# Patient Record
Sex: Male | Born: 1950 | Race: White | Hispanic: No | Smoking: Former smoker
Health system: Southern US, Community
[De-identification: ages and names within clinical notes are randomized; demographics above are authoritative.]

## PROBLEM LIST (undated history)

## (undated) DIAGNOSIS — F329 Major depressive disorder, single episode, unspecified: Secondary | ICD-10-CM

## (undated) DIAGNOSIS — K759 Inflammatory liver disease, unspecified: Secondary | ICD-10-CM

## (undated) DIAGNOSIS — R972 Elevated prostate specific antigen [PSA]: Secondary | ICD-10-CM

## (undated) DIAGNOSIS — I951 Orthostatic hypotension: Secondary | ICD-10-CM

## (undated) DIAGNOSIS — J449 Chronic obstructive pulmonary disease, unspecified: Secondary | ICD-10-CM

## (undated) DIAGNOSIS — I10 Essential (primary) hypertension: Secondary | ICD-10-CM

## (undated) DIAGNOSIS — I519 Heart disease, unspecified: Secondary | ICD-10-CM

## (undated) DIAGNOSIS — I509 Heart failure, unspecified: Secondary | ICD-10-CM

## (undated) DIAGNOSIS — D689 Coagulation defect, unspecified: Secondary | ICD-10-CM

## (undated) DIAGNOSIS — F419 Anxiety disorder, unspecified: Secondary | ICD-10-CM

## (undated) DIAGNOSIS — K219 Gastro-esophageal reflux disease without esophagitis: Secondary | ICD-10-CM

## (undated) DIAGNOSIS — S069X9A Unspecified intracranial injury with loss of consciousness of unspecified duration, initial encounter: Secondary | ICD-10-CM

## (undated) DIAGNOSIS — F32A Depression, unspecified: Secondary | ICD-10-CM

## (undated) DIAGNOSIS — G4730 Sleep apnea, unspecified: Secondary | ICD-10-CM

## (undated) DIAGNOSIS — E7800 Pure hypercholesterolemia, unspecified: Secondary | ICD-10-CM

## (undated) DIAGNOSIS — R339 Retention of urine, unspecified: Secondary | ICD-10-CM

## (undated) DIAGNOSIS — I4891 Unspecified atrial fibrillation: Secondary | ICD-10-CM

## (undated) DIAGNOSIS — M1990 Unspecified osteoarthritis, unspecified site: Secondary | ICD-10-CM

## (undated) DIAGNOSIS — S069XAA Unspecified intracranial injury with loss of consciousness status unknown, initial encounter: Secondary | ICD-10-CM

## (undated) DIAGNOSIS — I219 Acute myocardial infarction, unspecified: Secondary | ICD-10-CM

## (undated) HISTORY — PX: OTHER SURGICAL HISTORY: SHX169

## (undated) HISTORY — DX: Sleep apnea, unspecified: G47.30

## (undated) HISTORY — DX: Heart disease, unspecified: I51.9

## (undated) HISTORY — DX: Acute myocardial infarction, unspecified: I21.9

## (undated) HISTORY — DX: Essential (primary) hypertension: I10

## (undated) HISTORY — DX: Elevated prostate specific antigen (PSA): R97.20

## (undated) HISTORY — DX: Anxiety disorder, unspecified: F41.9

## (undated) HISTORY — DX: Depression, unspecified: F32.A

## (undated) HISTORY — DX: Heart failure, unspecified: I50.9

## (undated) HISTORY — DX: Unspecified atrial fibrillation: I48.91

## (undated) HISTORY — DX: Pure hypercholesterolemia, unspecified: E78.00

## (undated) HISTORY — DX: Coagulation defect, unspecified: D68.9

## (undated) HISTORY — PX: BLADDER SURGERY: SHX569

## (undated) HISTORY — DX: Inflammatory liver disease, unspecified: K75.9

## (undated) HISTORY — DX: Major depressive disorder, single episode, unspecified: F32.9

## (undated) HISTORY — DX: Unspecified osteoarthritis, unspecified site: M19.90

## (undated) HISTORY — DX: Unspecified intracranial injury with loss of consciousness of unspecified duration, initial encounter: S06.9X9A

## (undated) HISTORY — DX: Unspecified intracranial injury with loss of consciousness status unknown, initial encounter: S06.9XAA

## (undated) HISTORY — DX: Gastro-esophageal reflux disease without esophagitis: K21.9

## (undated) HISTORY — PX: EP IMPLANTABLE DEVICE: SHX172B

## (undated) HISTORY — PX: CARDIAC CATHETERIZATION: SHX172

---

## 2008-06-05 ENCOUNTER — Ambulatory Visit: Payer: Self-pay | Admitting: Internal Medicine

## 2014-07-06 HISTORY — PX: GREEN LIGHT LASER TURP (TRANSURETHRAL RESECTION OF PROSTATE: SHX6260

## 2016-06-18 ENCOUNTER — Emergency Department
Admission: EM | Admit: 2016-06-18 | Discharge: 2016-06-19 | Disposition: A | Discharge status: Home / Self Care | Payer: BLUE CROSS/BLUE SHIELD | Attending: Student in an Organized Health Care Education/Training Program | Admitting: Student in an Organized Health Care Education/Training Program

## 2016-06-18 DIAGNOSIS — R319 Hematuria, unspecified: Secondary | ICD-10-CM | POA: Diagnosis present

## 2016-06-18 DIAGNOSIS — Z79891 Long term (current) use of opiate analgesic: Secondary | ICD-10-CM | POA: Insufficient documentation

## 2016-06-18 DIAGNOSIS — I509 Heart failure, unspecified: Secondary | ICD-10-CM | POA: Diagnosis not present

## 2016-06-18 DIAGNOSIS — J449 Chronic obstructive pulmonary disease, unspecified: Secondary | ICD-10-CM | POA: Insufficient documentation

## 2016-06-18 DIAGNOSIS — M791 Myalgia, unspecified site: Secondary | ICD-10-CM

## 2016-06-18 DIAGNOSIS — Z8546 Personal history of malignant neoplasm of prostate: Secondary | ICD-10-CM | POA: Diagnosis not present

## 2016-06-18 DIAGNOSIS — R3911 Hesitancy of micturition: Secondary | ICD-10-CM | POA: Insufficient documentation

## 2016-06-18 HISTORY — DX: Heart failure, unspecified: I50.9

## 2016-06-18 HISTORY — DX: Chronic obstructive pulmonary disease, unspecified: J44.9

## 2016-06-18 NOTE — ED Provider Notes (Signed)
[facility] Emergency Department Provider Note    First MD Initiated Contact with Patient 06/18/16 2337     (approximate)  I have reviewed the triage vital signs and the nursing notes.   HISTORY  Chief Complaint Urinary Tract Infection    HPI Kevin Pearson is a 65 y.o. 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 Patient presents today because the myalgias and body aches worsened. He denies any chest pain or worsening shortness of breath. He wears oxygen intermittently.   Past Medical History:  Diagnosis Date  . CHF (congestive heart failure) (HCC)   . COPD (chronic obstructive pulmonary disease) (HCC)    No family history on file. Past Surgical History:  Procedure Laterality Date  . BLADDER SURGERY    . cardiac stents     There are no active problems to display for this patient.     Prior to Admission medications   Not on File    Allergies Patient has no allergy information on record.    Social History Social History  Substance Use Topics  . Smoking status: Former Games developermoker  . Smokeless tobacco: Never Used  . Alcohol use Yes    Review of Systems Patient denies headaches, rhinorrhea, blurry vision, numbness, shortness of breath, chest pain, edema, cough, abdominal pain, nausea, vomiting, diarrhea, dysuria, fevers, rashes or hallucinations unless otherwise stated above in HPI. ____________________________________________   PHYSICAL EXAM:  VITAL SIGNS: Vitals:   06/18/16 2332  BP: 90/63  Pulse: 70  Resp: 16  Temp: 97.7 F (36.5 C)    Constitutional: Alert and oriented. In no acute distress Eyes: Conjunctivae are normal. PERRL. EOMI. Head: Atraumatic. Nose: No congestion/rhinnorhea. Mouth/Throat: Mucous membranes are moist.  Oropharynx  non-erythematous. Neck: No stridor. Painless ROM. No cervical spine tenderness to palpation Hematological/Lymphatic/Immunilogical: No cervical lymphadenopathy. Cardiovascular: Normal rate, regular rhythm. Grossly normal heart sounds.  Good peripheral circulation. Respiratory: Normal respiratory effort.  No retractions. Lungs with diffuse expiratory wheezing. Gastrointestinal: Soft and nontender. No distention. No abdominal bruits. No CVA tenderness. Musculoskeletal: No lower extremity tenderness nor edema.  No joint effusions. Neurologic:  Normal speech and language. No gross focal neurologic deficits are appreciated. No gait instability. Skin:  Skin is warm, dry and intact. No rash noted. Psychiatric: Mood and affect are normal. Speech and behavior are normal.  ____________________________________________   LABS (all labs ordered are listed, but only abnormal results are displayed)  No results found for this or any previous visit (from the past 24 hour(s)). ____________________________________________  EKG My review and personal interpretation at Time: 23:32   Indication: h/o cad  Rate: 70  Rhythm: v paced Axis: normal Other: no sgarbossa criteria,  ____________________________________________  RADIOLOGY  I personally reviewed all radiographic images ordered to evaluate for the above acute complaints and reviewed radiology reports and findings.  These findings were personally discussed with the patient.  Please see medical record for radiology report.  ____________________________________________   PROCEDURES  Procedure(s) performed: none Procedures    Critical Care performed: no ____________________________________________   INITIAL IMPRESSION / ASSESSMENT AND PLAN / ED COURSE  Pertinent labs & imaging results that were available during my care of the patient were reviewed by me and considered in my medical decision making (see chart for details).  Uti, acs, sepsis,  pna, flu, arthralgias  Kevin EngCarmen Yapp is  a 65 y.o. who presents to the ED with multiple complaints most of which seem to be ongoing for 4-5 months. Will order urinalysis to evaluate for any underlying urinary tract infection or hematuria. Patient with significant history of CAD but describing very atypical features and denies any chest pain or shortness of breath. EKG shows a paced rhythm without any acute ischemic changes. Initial troponin shows no evidence of troponin elevation. We'll order chest x-ray to evaluate for any evidence of pneumonia or active congestive heart failure. Patient also with reported history of COPD but has good diffuse lung sounds. His abdominal exam is soft and benign. He has no evidence of trauma on exam.  The patient will be placed on continuous pulse oximetry and telemetry for monitoring.  Laboratory evaluation will be sent to evaluate for the above complaints.     Clinical Course as of Jun 20 431  Fri Jun 19, 2016  0059 I spoke with the patient's daughter who states the patient was complaining of generalized myalgias as well as nausea. States that she has noted some cognitive decline over the past year. No reported chest pain or shortness of breath. He has been complaining of UTIs and was reportedly prone to them therefore she was concerned about urinary tract infection.  [PR]  0144 Urinalysis that shows no evidence of hematuria or infection. Blood work shows no evidence of leukocytosis or acidosis. His troponin is negative. Currently awaiting lactic acid. His flu is negative. The patient remains hemodynamically stable without any signs of Sirs criteria. Based on his cardiac risk factors will continue to monitor patient and repeat the 3 hour troponin.  [PR]  [phone] Patient remains assymptomatic and in NAD.  No evidence of dysrhythmia on the monitor.  Currently awaiting repeat trop.    [PR]  0359 Repeat trop negative.  Patient's blood work is reassuring. He remains hemodynamic  stable without any Sirs criteria. He's been asymptomatic since his arrival. Do not feel further diagnostic testing clinically indicated at this time. He did demonstrate some evidence of urinary hesitancy therefore will start on Flomax and provide referral for both cardiology and urology here  [PR]    Clinical Course User Index [PR] Willy EddyPatrick Caidence Kaseman, MD     ____________________________________________   FINAL CLINICAL IMPRESSION(S) / ED DIAGNOSES  Final diagnoses:  Generalized muscle ache  Urinary hesitancy      NEW MEDICATIONS STARTED DURING THIS VISIT:  New Prescriptions   No medications on file     Note:  This document was prepared using Dragon voice recognition software and may include unintentional dictation errors.    Willy EddyPatrick Lacharles Altschuler, MD 06/19/16 [phone]

## 2016-06-18 NOTE — ED Triage Notes (Addendum)
Pt presents to ED with c/o UTI-like s/x's and generalized body aches x "4-5 months". Pt reports cloudy, bloody, and foul-smelling urine x "3-4 months". Pt reports having partial bladder removal in December of 2016. Pt is A&O, in NAD, with respirations even, regular, and unlabored. EMS states pt has implanted pacemaker/defibrillator with a cardiac h/x of "14 MI's and stent placements".

## 2016-06-19 ENCOUNTER — Emergency Department: Payer: BLUE CROSS/BLUE SHIELD

## 2016-06-19 LAB — COMPREHENSIVE METABOLIC PANEL
ALT: 10 U/L — ABNORMAL LOW (ref 17–63)
AST: 18 U/L (ref 15–41)
Albumin: 3.5 g/dL (ref 3.5–5.0)
Alkaline Phosphatase: 61 U/L (ref 38–126)
Anion gap: 6 (ref 5–15)
BUN: 25 mg/dL — ABNORMAL HIGH (ref 6–20)
CO2: 27 mmol/L (ref 22–32)
Calcium: 9.1 mg/dL (ref 8.9–10.3)
Chloride: 106 mmol/L (ref 101–111)
Creatinine, Ser: 1.26 mg/dL — ABNORMAL HIGH (ref 0.61–1.24)
GFR calc Af Amer: >60 mL/min (ref >60)
GFR calc non Af Amer: 59 mL/min — ABNORMAL LOW (ref >60)
Glucose, Bld: 98 mg/dL (ref 65–99)
Potassium: 3.8 mmol/L (ref 3.5–5.1)
Sodium: 139 mmol/L (ref 135–145)
Total Bilirubin: 0.5 mg/dL (ref 0.3–1.2)
Total Protein: 7 g/dL (ref 6.5–8.1)

## 2016-06-19 LAB — CBC WITH DIFFERENTIAL/PLATELET
Basophils Absolute: 0.1 10*3/uL (ref 0–0.1)
Basophils Relative: 1 %
Eosinophils Absolute: 0.3 10*3/uL (ref 0–0.7)
Eosinophils Relative: 4 %
HCT: 33.5 % — ABNORMAL LOW (ref 40.0–52.0)
Hemoglobin: 11.1 g/dL — ABNORMAL LOW (ref 13.0–18.0)
Lymphocytes Relative: 28 %
Lymphs Abs: 2.3 10*3/uL (ref 1.0–3.6)
MCH: 27.3 pg (ref 26.0–34.0)
MCHC: 33.3 g/dL (ref 32.0–36.0)
MCV: 81.9 fL (ref 80.0–100.0)
Monocytes Absolute: 1 10*3/uL (ref 0.2–1.0)
Monocytes Relative: 12 %
Neutro Abs: 4.6 10*3/uL (ref 1.4–6.5)
Neutrophils Relative %: 55 %
Platelets: 211 10*3/uL (ref 150–440)
RBC: 4.09 MIL/uL — ABNORMAL LOW (ref 4.40–5.90)
RDW: 19.6 % — ABNORMAL HIGH (ref 11.5–14.5)
WBC: 8.3 10*3/uL (ref 3.8–10.6)

## 2016-06-19 LAB — URINALYSIS, COMPLETE (UACMP) WITH MICROSCOPIC
Bacteria, UA: NONE SEEN
Bilirubin Urine: NEGATIVE
Glucose, UA: NEGATIVE mg/dL
Hgb urine dipstick: NEGATIVE
Ketones, ur: NEGATIVE mg/dL
Leukocytes, UA: NEGATIVE
Nitrite: NEGATIVE
Protein, ur: NEGATIVE mg/dL
Specific Gravity, Urine: 1.016 (ref 1.005–1.030)
pH: 6 (ref 5.0–8.0)

## 2016-06-19 LAB — INFLUENZA PANEL BY PCR (TYPE A & B)
Influenza A By PCR: NEGATIVE
Influenza B By PCR: NEGATIVE

## 2016-06-19 LAB — LACTIC ACID, PLASMA: Lactic Acid, Venous: 1.7 mmol/L (ref 0.5–1.9)

## 2016-06-19 LAB — TROPONIN I
Troponin I: <0.03 ng/mL (ref <0.03)
Troponin I: <0.03 ng/mL (ref <0.03)

## 2016-06-19 MED ORDER — TAMSULOSIN HCL 0.4 MG PO CAPS: 0.4000 mg | ORAL_CAPSULE | Freq: Every day | ORAL | 0 refills | Status: DC

## 2016-06-19 NOTE — Discharge Instructions (Signed)
Return for any fevers, chest pain, shortness of breath, Nausea or vomiting.  Follow up with urology and cardiology.  Return for any other concerns.

## 2016-06-19 NOTE — ED Notes (Addendum)
Spoke with pt's daughter Gunnar Fusi(Cara Przybylski [phone]) regarding transportation arrangements d/t pt's pending d/c.  Ms Janet BerlinGutto stated she would be willing to pick up the pt at d/c to drive him home and would be here before 530am since [place] [facility] [place]

## 2016-06-24 ENCOUNTER — Encounter: Payer: Self-pay | Admitting: Urology

## 2016-06-24 ENCOUNTER — Observation Stay
Admission: EM | Admit: 2016-06-24 | Discharge: 2016-06-25 | Disposition: A | Discharge status: Home / Self Care | Payer: BLUE CROSS/BLUE SHIELD | Attending: Urology | Admitting: Urology

## 2016-06-24 ENCOUNTER — Ambulatory Visit (INDEPENDENT_AMBULATORY_CARE_PROVIDER_SITE_OTHER): Payer: BLUE CROSS/BLUE SHIELD | Admitting: Urology

## 2016-06-24 VITALS — BP 153/72 | HR 65 | Ht 68.0 in | Wt 263.8 lb

## 2016-06-24 DIAGNOSIS — I2510 Atherosclerotic heart disease of native coronary artery without angina pectoris: Secondary | ICD-10-CM

## 2016-06-24 DIAGNOSIS — R339 Retention of urine, unspecified: Secondary | ICD-10-CM

## 2016-06-24 DIAGNOSIS — I429 Cardiomyopathy, unspecified: Secondary | ICD-10-CM | POA: Insufficient documentation

## 2016-06-24 DIAGNOSIS — Z885 Allergy status to narcotic agent status: Secondary | ICD-10-CM | POA: Insufficient documentation

## 2016-06-24 DIAGNOSIS — E7800 Pure hypercholesterolemia, unspecified: Secondary | ICD-10-CM | POA: Diagnosis not present

## 2016-06-24 DIAGNOSIS — I110 Hypertensive heart disease with heart failure: Secondary | ICD-10-CM | POA: Insufficient documentation

## 2016-06-24 DIAGNOSIS — N320 Bladder-neck obstruction: Secondary | ICD-10-CM | POA: Diagnosis not present

## 2016-06-24 DIAGNOSIS — D689 Coagulation defect, unspecified: Secondary | ICD-10-CM | POA: Diagnosis not present

## 2016-06-24 DIAGNOSIS — R3911 Hesitancy of micturition: Secondary | ICD-10-CM

## 2016-06-24 DIAGNOSIS — Z955 Presence of coronary angioplasty implant and graft: Secondary | ICD-10-CM | POA: Diagnosis not present

## 2016-06-24 DIAGNOSIS — Z79899 Other long term (current) drug therapy: Secondary | ICD-10-CM | POA: Insufficient documentation

## 2016-06-24 DIAGNOSIS — Z888 Allergy status to other drugs, medicaments and biological substances status: Secondary | ICD-10-CM | POA: Insufficient documentation

## 2016-06-24 DIAGNOSIS — Z8546 Personal history of malignant neoplasm of prostate: Secondary | ICD-10-CM | POA: Diagnosis not present

## 2016-06-24 DIAGNOSIS — F419 Anxiety disorder, unspecified: Secondary | ICD-10-CM | POA: Insufficient documentation

## 2016-06-24 DIAGNOSIS — F329 Major depressive disorder, single episode, unspecified: Secondary | ICD-10-CM | POA: Insufficient documentation

## 2016-06-24 DIAGNOSIS — Z95810 Presence of automatic (implantable) cardiac defibrillator: Secondary | ICD-10-CM | POA: Diagnosis not present

## 2016-06-24 DIAGNOSIS — M1990 Unspecified osteoarthritis, unspecified site: Secondary | ICD-10-CM | POA: Diagnosis not present

## 2016-06-24 DIAGNOSIS — K219 Gastro-esophageal reflux disease without esophagitis: Secondary | ICD-10-CM | POA: Insufficient documentation

## 2016-06-24 DIAGNOSIS — Z9981 Dependence on supplemental oxygen: Secondary | ICD-10-CM | POA: Diagnosis not present

## 2016-06-24 DIAGNOSIS — N359 Urethral stricture, unspecified: Principal | ICD-10-CM | POA: Insufficient documentation

## 2016-06-24 DIAGNOSIS — G4730 Sleep apnea, unspecified: Secondary | ICD-10-CM | POA: Insufficient documentation

## 2016-06-24 DIAGNOSIS — J449 Chronic obstructive pulmonary disease, unspecified: Secondary | ICD-10-CM | POA: Diagnosis not present

## 2016-06-24 DIAGNOSIS — N400 Enlarged prostate without lower urinary tract symptoms: Secondary | ICD-10-CM

## 2016-06-24 DIAGNOSIS — Z7902 Long term (current) use of antithrombotics/antiplatelets: Secondary | ICD-10-CM | POA: Diagnosis not present

## 2016-06-24 DIAGNOSIS — I5042 Chronic combined systolic (congestive) and diastolic (congestive) heart failure: Secondary | ICD-10-CM | POA: Diagnosis not present

## 2016-06-24 DIAGNOSIS — Z87891 Personal history of nicotine dependence: Secondary | ICD-10-CM | POA: Diagnosis not present

## 2016-06-24 DIAGNOSIS — Z7901 Long term (current) use of anticoagulants: Secondary | ICD-10-CM | POA: Diagnosis not present

## 2016-06-24 DIAGNOSIS — Z01810 Encounter for preprocedural cardiovascular examination: Secondary | ICD-10-CM | POA: Diagnosis not present

## 2016-06-24 DIAGNOSIS — I5022 Chronic systolic (congestive) heart failure: Secondary | ICD-10-CM

## 2016-06-24 DIAGNOSIS — I10 Essential (primary) hypertension: Secondary | ICD-10-CM

## 2016-06-24 DIAGNOSIS — I482 Chronic atrial fibrillation: Secondary | ICD-10-CM | POA: Insufficient documentation

## 2016-06-24 DIAGNOSIS — I252 Old myocardial infarction: Secondary | ICD-10-CM | POA: Insufficient documentation

## 2016-06-24 HISTORY — DX: Retention of urine, unspecified: R33.9

## 2016-06-24 LAB — PROTIME-INR
INR: 1.43
Prothrombin Time: 17.6 seconds — ABNORMAL HIGH (ref 11.4–15.2)

## 2016-06-24 LAB — COMPREHENSIVE METABOLIC PANEL
ALT: 12 U/L — ABNORMAL LOW (ref 17–63)
AST: 21 U/L (ref 15–41)
Albumin: 3.8 g/dL (ref 3.5–5.0)
Alkaline Phosphatase: 43 U/L (ref 38–126)
Anion gap: 6 (ref 5–15)
BUN: 20 mg/dL (ref 6–20)
CO2: 27 mmol/L (ref 22–32)
Calcium: 9.4 mg/dL (ref 8.9–10.3)
Chloride: 106 mmol/L (ref 101–111)
Creatinine, Ser: 1.13 mg/dL (ref 0.61–1.24)
GFR calc Af Amer: >60 mL/min (ref >60)
GFR calc non Af Amer: >60 mL/min (ref >60)
Glucose, Bld: 106 mg/dL — ABNORMAL HIGH (ref 65–99)
Potassium: 4.2 mmol/L (ref 3.5–5.1)
Sodium: 139 mmol/L (ref 135–145)
Total Bilirubin: 0.3 mg/dL (ref 0.3–1.2)
Total Protein: 7.5 g/dL (ref 6.5–8.1)

## 2016-06-24 LAB — CBC WITH DIFFERENTIAL/PLATELET
Basophils Absolute: 0.1 10*3/uL (ref 0–0.1)
Basophils Relative: 1 %
Eosinophils Absolute: 0.4 10*3/uL (ref 0–0.7)
Eosinophils Relative: 4 %
HCT: 36.8 % — ABNORMAL LOW (ref 40.0–52.0)
Hemoglobin: 12.2 g/dL — ABNORMAL LOW (ref 13.0–18.0)
Lymphocytes Relative: 19 %
Lymphs Abs: 1.8 10*3/uL (ref 1.0–3.6)
MCH: 27.2 pg (ref 26.0–34.0)
MCHC: 33 g/dL (ref 32.0–36.0)
MCV: 82.3 fL (ref 80.0–100.0)
Monocytes Absolute: 1.1 10*3/uL — ABNORMAL HIGH (ref 0.2–1.0)
Monocytes Relative: 12 %
Neutro Abs: 5.9 10*3/uL (ref 1.4–6.5)
Neutrophils Relative %: 64 %
Platelets: 219 10*3/uL (ref 150–440)
RBC: 4.47 MIL/uL (ref 4.40–5.90)
RDW: 19.7 % — ABNORMAL HIGH (ref 11.5–14.5)
WBC: 9.3 10*3/uL (ref 3.8–10.6)

## 2016-06-24 LAB — URINALYSIS, COMPLETE (UACMP) WITH MICROSCOPIC
Bacteria, UA: NONE SEEN
Bilirubin Urine: NEGATIVE
Glucose, UA: NEGATIVE mg/dL
Ketones, ur: NEGATIVE mg/dL
Leukocytes, UA: NEGATIVE
Nitrite: NEGATIVE
Protein, ur: NEGATIVE mg/dL
Specific Gravity, Urine: 1.01 (ref 1.005–1.030)
Squamous Epithelial / HPF: NONE SEEN
pH: 7 (ref 5.0–8.0)

## 2016-06-24 LAB — BLADDER SCAN AMB NON-IMAGING: Scan Result: >776

## 2016-06-24 LAB — APTT: aPTT: 38 seconds — ABNORMAL HIGH (ref 24–36)

## 2016-06-24 MED ORDER — PANTOPRAZOLE SODIUM 40 MG PO TBEC: 80.0000 mg | DELAYED_RELEASE_TABLET | Freq: Every day | ORAL | Status: DC

## 2016-06-24 MED ORDER — HYDROMORPHONE HCL 1 MG/ML IJ SOLN
0.5000 mg | Freq: Once | INTRAMUSCULAR | Status: AC
  Administered 2016-06-24: 0.5 mg via INTRAVENOUS

## 2016-06-24 MED ORDER — ALBUTEROL SULFATE HFA 108 (90 BASE) MCG/ACT IN AERS: 2.0000 | INHALATION_SPRAY | RESPIRATORY_TRACT | Status: DC | PRN

## 2016-06-24 MED ORDER — CEFAZOLIN (ANCEF) 1 G IV SOLR
2.0000 g | INTRAVENOUS | Status: AC
  Administered 2016-06-25: 2 g
  Filled 2016-06-24: qty 2

## 2016-06-24 MED ORDER — PHENAZOPYRIDINE HCL 200 MG PO TABS: 200.0000 mg | ORAL_TABLET | Freq: Three times a day (TID) | ORAL | 0 refills | Status: DC | PRN

## 2016-06-24 MED ORDER — FUROSEMIDE 40 MG PO TABS
40.0000 mg | ORAL_TABLET | Freq: Two times a day (BID) | ORAL | Status: DC
  Administered 2016-06-24 – 2016-06-25 (×2): 40 mg via ORAL
  Filled 2016-06-24 (×2): qty 1

## 2016-06-24 MED ORDER — PHENAZOPYRIDINE HCL 100 MG PO TABS
100.0000 mg | ORAL_TABLET | Freq: Three times a day (TID) | ORAL | Status: DC
  Administered 2016-06-24 – 2016-06-25 (×2): 100 mg via ORAL
  Filled 2016-06-24 (×3): qty 1

## 2016-06-24 MED ORDER — ZOLPIDEM TARTRATE 5 MG PO TABS
5.0000 mg | ORAL_TABLET | Freq: Every evening | ORAL | Status: DC | PRN
  Administered 2016-06-25: 5 mg via ORAL
  Filled 2016-06-24: qty 1

## 2016-06-24 MED ORDER — DIPHENHYDRAMINE HCL 50 MG/ML IJ SOLN: 12.5000 mg | Freq: Four times a day (QID) | INTRAMUSCULAR | Status: DC | PRN

## 2016-06-24 MED ORDER — NITROGLYCERIN 0.4 MG SL SUBL: 0.4000 mg | SUBLINGUAL_TABLET | SUBLINGUAL | Status: DC | PRN

## 2016-06-24 MED ORDER — DIPHENHYDRAMINE HCL 12.5 MG/5ML PO ELIX: 12.5000 mg | ORAL_SOLUTION | Freq: Four times a day (QID) | ORAL | Status: DC | PRN

## 2016-06-24 MED ORDER — POTASSIUM CHLORIDE CRYS ER 20 MEQ PO TBCR
20.0000 meq | EXTENDED_RELEASE_TABLET | Freq: Two times a day (BID) | ORAL | Status: DC
Start: 2016-06-24 — End: 2016-06-25
  Administered 2016-06-24: 20 meq via ORAL
  Filled 2016-06-24: qty 1

## 2016-06-24 MED ORDER — GABAPENTIN 300 MG PO CAPS
300.0000 mg | ORAL_CAPSULE | Freq: Three times a day (TID) | ORAL | Status: DC
  Administered 2016-06-24: 300 mg via ORAL
  Filled 2016-06-24: qty 1

## 2016-06-24 MED ORDER — DOCUSATE SODIUM 100 MG PO CAPS
100.0000 mg | ORAL_CAPSULE | Freq: Two times a day (BID) | ORAL | Status: DC
  Administered 2016-06-24: 100 mg via ORAL
  Filled 2016-06-24: qty 1

## 2016-06-24 MED ORDER — ROSUVASTATIN CALCIUM 20 MG PO TABS: 20.0000 mg | ORAL_TABLET | Freq: Every day | ORAL | Status: DC

## 2016-06-24 MED ORDER — ALBUTEROL SULFATE (2.5 MG/3ML) 0.083% IN NEBU: 2.5000 mg | INHALATION_SOLUTION | RESPIRATORY_TRACT | Status: DC | PRN

## 2016-06-24 MED ORDER — ACETAMINOPHEN 325 MG PO TABS
650.0000 mg | ORAL_TABLET | ORAL | Status: DC | PRN
  Administered 2016-06-24: 650 mg via ORAL
  Filled 2016-06-24: qty 2

## 2016-06-24 MED ORDER — LIDOCAINE HCL 2 % EX GEL
CUTANEOUS | Status: AC
  Administered 2016-06-24: 1 via URETHRAL
  Filled 2016-06-24: qty 10

## 2016-06-24 MED ORDER — LIDOCAINE HCL 2 % EX GEL
1.0000 "application " | Freq: Once | CUTANEOUS | Status: AC
  Administered 2016-06-24: 1 via URETHRAL

## 2016-06-24 MED ORDER — ONDANSETRON HCL 4 MG/2ML IJ SOLN
INTRAMUSCULAR | Status: AC
  Administered 2016-06-24: 4 mg via INTRAVENOUS
  Filled 2016-06-24: qty 2

## 2016-06-24 MED ORDER — LORAZEPAM 2 MG/ML IJ SOLN
INTRAMUSCULAR | Status: AC
  Administered 2016-06-24: 1 mg via INTRAVENOUS
  Filled 2016-06-24: qty 1

## 2016-06-24 MED ORDER — CLONAZEPAM 1 MG PO TABS
1.0000 mg | ORAL_TABLET | Freq: Three times a day (TID) | ORAL | Status: DC
  Administered 2016-06-24 – 2016-06-25 (×2): 1 mg via ORAL
  Filled 2016-06-24 (×2): qty 2

## 2016-06-24 MED ORDER — TAMSULOSIN HCL 0.4 MG PO CAPS
0.4000 mg | ORAL_CAPSULE | Freq: Every day | ORAL | Status: DC
  Administered 2016-06-24: 0.4 mg via ORAL
  Filled 2016-06-24: qty 1

## 2016-06-24 MED ORDER — SODIUM CHLORIDE 0.9 % IV SOLN: INTRAVENOUS | Status: DC

## 2016-06-24 MED ORDER — DIVALPROEX SODIUM ER 500 MG PO TB24
500.0000 mg | ORAL_TABLET | Freq: Every day | ORAL | Status: DC
  Administered 2016-06-24 – 2016-06-25 (×2): 500 mg via ORAL
  Filled 2016-06-24 (×3): qty 1

## 2016-06-24 MED ORDER — CLOPIDOGREL BISULFATE 75 MG PO TABS: 75.0000 mg | ORAL_TABLET | Freq: Every day | ORAL | Status: DC

## 2016-06-24 MED ORDER — RIVAROXABAN 20 MG PO TABS
20.0000 mg | ORAL_TABLET | Freq: Every day | ORAL | Status: DC
  Filled 2016-06-24: qty 1

## 2016-06-24 MED ORDER — HYDRALAZINE HCL 25 MG PO TABS
25.0000 mg | ORAL_TABLET | Freq: Three times a day (TID) | ORAL | Status: DC
  Administered 2016-06-24: 25 mg via ORAL
  Filled 2016-06-24: qty 1

## 2016-06-24 MED ORDER — OXYCODONE HCL 5 MG PO TABS: 5.0000 mg | ORAL_TABLET | ORAL | Status: DC | PRN

## 2016-06-24 MED ORDER — LOSARTAN POTASSIUM 50 MG PO TABS
100.0000 mg | ORAL_TABLET | Freq: Every day | ORAL | Status: DC
  Administered 2016-06-25: 100 mg via ORAL
  Filled 2016-06-24: qty 2

## 2016-06-24 MED ORDER — ONDANSETRON HCL 4 MG/2ML IJ SOLN: 4.0000 mg | INTRAMUSCULAR | Status: DC | PRN

## 2016-06-24 MED ORDER — MORPHINE SULFATE (PF) 4 MG/ML IV SOLN: 2.0000 mg | INTRAVENOUS | Status: DC | PRN

## 2016-06-24 MED ORDER — LORAZEPAM 2 MG/ML IJ SOLN
1.0000 mg | Freq: Once | INTRAMUSCULAR | Status: AC
  Administered 2016-06-24: 1 mg via INTRAVENOUS

## 2016-06-24 MED ORDER — HYDROMORPHONE HCL 1 MG/ML IJ SOLN
INTRAMUSCULAR | Status: AC
  Administered 2016-06-24: 0.5 mg via INTRAVENOUS
  Filled 2016-06-24: qty 1

## 2016-06-24 MED ORDER — CARVEDILOL 6.25 MG PO TABS
6.2500 mg | ORAL_TABLET | Freq: Two times a day (BID) | ORAL | Status: DC
  Administered 2016-06-24 – 2016-06-25 (×2): 6.25 mg via ORAL
  Filled 2016-06-24 (×2): qty 1

## 2016-06-24 MED ORDER — ONDANSETRON HCL 4 MG/2ML IJ SOLN
4.0000 mg | Freq: Once | INTRAMUSCULAR | Status: AC
  Administered 2016-06-24: 4 mg via INTRAVENOUS

## 2016-06-24 NOTE — Progress Notes (Addendum)
06/24/2016 12:40 PM   Kevin Pearson 1950-10-08 [id number]  Referring provider: No referring provider defined for this encounter.  Chief Complaint  Patient presents with  . New Patient (Initial Visit)    urinary hesitancy    HPI: Patient is a 65 year old Caucasian male who is referred to [place] from the emergency room for urinary hesitancy and frequency.  He is a poor historian and has recently relocated from [place].  I do not have his records available to me at this visit from his urologist in [place].  He has a significant cardiac history and states he is on several blood thinners.  He cannot give [place] a list of his medications.    He states that for the last month he's been having extreme difficulty with passing his urine. He states it has been foul-smelling and had blood.  According to the emergency room records it stated that he had a history of prostate cancer and a partial cystectomy.  Upon further questioning with the patient, it was most likely a [place] for urethral strictures.  His IPSS score today is 35, which is severe lower urinary tract symptomatology. He is terrible with his quality life due to his urinary symptoms.  His PVR is > 776 mL.  His major complaint today is inability to pass urine and suprapubic pain.   He currently taking tamsulosin.    He also denies any recent fevers, chills, nausea or vomiting.      IPSS    Row Name 06/24/16 1100         International Prostate Symptom Score   How often have you had the sensation of not emptying your bladder? Almost always     How often have you had to urinate less than every two hours? Almost always     How often have you found you stopped and started again several times when you urinated? Almost always     How often have you found it difficult to postpone urination? Almost always     How often have you had a weak urinary stream? Almost always     How often have you had to strain to start urination? Almost always     How many times did you typically get up at night to urinate? 5 Times     Total IPSS Score 35       Quality of Life due to urinary symptoms   If you were to spend the rest of your life with your urinary condition just the way it is now how would you feel about that? Terrible        Score:  1-7 Mild 8-19 Moderate 20-35 Severe      PMH: Past Medical History:  Diagnosis Date  . Acid reflux   . Anxiety   . Arthritis   . Atrial fibrillation ([place])   . CHF (congestive heart failure) ([place])   . Clotting disorder ([place])   . COPD (chronic obstructive pulmonary disease) ([place])   . Depression   . Elevated PSA   . Heart attack   . Heart disease   . Heart failure ([place])   . Hepatitis   . High cholesterol   . Hypertension   . Sleep apnea     Surgical History: Past Surgical History:  Procedure Laterality Date  . BLADDER SURGERY    . cardiac stents    . GREEN LIGHT LASER TURP (TRANSURETHRAL RESECTION OF PROSTATE  2016   done in [facility]  Home Medications:  Allergies as of 06/24/2016      Reactions   Codeine Nausea And Vomiting      Medication List       Accurate as of 06/24/16 12:40 PM. Always use your most recent med list.          tamsulosin 0.4 MG Caps capsule Commonly known as:  FLOMAX Take 1 capsule (0.4 mg total) by mouth daily after supper.       Allergies:  Allergies  Allergen Reactions  . Codeine Nausea And Vomiting    Family History: Family History  Problem Relation Age of Onset  . Kidney cancer Neg Hx   . Kidney disease Neg Hx   . Prostate cancer Neg Hx     Social History:  reports that he has quit smoking. He has never used smokeless tobacco. He reports that he drinks alcohol. He reports that he does not use drugs.  ROS: UROLOGY Frequent Urination?: Yes Hard to postpone urination?: Yes Burning/pain with urination?: Yes Get up at night to urinate?: Yes Leakage of urine?: Yes Urine stream starts and stops?: Yes Trouble starting stream?:  Yes Do you have to strain to urinate?: Yes Blood in urine?: Yes Urinary tract infection?: No Sexually transmitted disease?: No Injury to kidneys or bladder?: Yes Painful intercourse?: Yes Weak stream?: Yes Erection problems?: Yes Penile pain?: Yes  Gastrointestinal Nausea?: Yes Vomiting?: Yes Indigestion/heartburn?: Yes Diarrhea?: Yes Constipation?: Yes  Constitutional Fever: Yes Night sweats?: Yes Weight loss?: Yes Fatigue?: Yes  Skin Skin rash/lesions?: No Itching?: Yes  Eyes Blurred vision?: Yes Double vision?: No  Ears/Nose/Throat Sore throat?: Yes Sinus problems?: Yes  Hematologic/Lymphatic Swollen glands?: Yes Easy bruising?: Yes  Cardiovascular Leg swelling?: Yes Chest pain?: Yes  Respiratory Cough?: Yes Shortness of breath?: Yes  Endocrine Excessive thirst?: No  Musculoskeletal Back pain?: Yes Joint pain?: Yes  Neurological Headaches?: No Dizziness?: No  Psychologic Depression?: Yes Anxiety?: Yes  Physical Exam: BP (!) 153/72   Pulse 65   Ht _0  (1.727 m)   Wt 263 lb 12.8 oz (119.7 kg)   BMI 40.11 kg/m   Constitutional: Well nourished. Alert and oriented, No acute distress. HEENT: [place] AT, moist mucus membranes. Trachea midline, no masses. Cardiovascular: No clubbing, cyanosis, or edema. Respiratory: Normal respiratory effort, no increased work of breathing. GI: Abdomen is soft, non tender, non distended, no abdominal masses. Liver and spleen not palpable.  No hernias appreciated.  Stool sample for occult testing is not indicated.   GU: No CVA tenderness.  No bladder fullness or masses.  Patient with circumcised phallus.   Urethral meatus is patent.  No penile discharge. No penile lesions or rashes. Scrotum without lesions, cysts, rashes and/or edema.  Testicles are located scrotally bilaterally. No masses are appreciated in the testicles. Left and right epididymis are normal. Rectal: Deferred.   Skin: No rashes, bruises or  suspicious lesions. Lymph: No cervical or inguinal adenopathy. Neurologic: Grossly intact, no focal deficits, moving all 4 extremities. Psychiatric: Normal mood and affect.  Laboratory Data: Lab Results  Component Value Date   WBC 8.3 06/19/2016   HGB 11.1 (L) 06/19/2016   HCT 33.5 (L) 06/19/2016   MCV 81.9 06/19/2016   PLT 211 06/19/2016    Lab Results  Component Value Date   CREATININE 1.26 (H) 06/19/2016    Lab Results  Component Value Date   AST 18 06/19/2016   Lab Results  Component Value Date   ALT 10 (L) 06/19/2016    Pertinent  Imaging: Results for RENALDO, GORNICK (MRN [id number]) as of 06/24/2016 12:10  Ref. Range 06/24/2016 11:21  Scan Result Unknown >776     Procedure Attempted catheter placement Patient is present today for an office visit and was found to be in urinary retention.   Patient was cleaned and prepped in a sterile fashion with betadine.   A 16 FR foley cath was inserted, but it would not pass the prostatic urethra.  A 16 FR coude was then attempted and met with the same resistance.  A 14 FR coude was then attempted and met with the same resistance.  No urine return was noted.    Preformed by: Zara Council, PA-C  Assessment & Plan:   1. Urinary retention  - attempts at placing a Foley failed  - patient will need to seek further treatment in the ED  - transfered by EMS due psychosocial issues to the ED  - daughter has been contacted and will be faxing a list of his current medications to [facility] ED  - BLADDER SCAN AMB NON-IMAGING  2. Urinary hesitancy  - possible hx of urethral strictures  - see above  2. Question of a history of prostate cancer  - request placed for records from urologists office in [facility].       Return for sent to ED.  These notes generated with voice recognition software. I apologize for typographical errors.  Zara Council, [facility] [address] [place], [place]  [address] [phone]

## 2016-06-24 NOTE — ED Notes (Signed)
Patient is not being discharged per anesthesia who wants patient admitted for pre-op due to cardiac history.

## 2016-06-24 NOTE — Plan of Care (Signed)
[facility] for [facility] Attempting to get information on prior  cardiac cath between dates of 01/04/16 and 03/04/16. From Dr. Prudencio Pairhadda to Dr. Thayer Ohmhris End (Cardiology w/ [facility]). Requested information as soon as possible d/t procedure planned for 12/21.

## 2016-06-24 NOTE — ED Notes (Signed)
Pt only has in bladder, urologist at the bedside and states no catheter needed at this time since the pt was able to urinate prior to her arrival..

## 2016-06-24 NOTE — Addendum Note (Signed)
Addended by: Michiel CowboyMCGOWAN, [place]ym[place] Yeske A on: 06/24/2016 01:24 PM   Modules accepted: Orders

## 2016-06-24 NOTE — Consult Note (Addendum)
Cardiology Consultation Note    Patient ID: Kevin Pearson, MRN: [id number], DOB/AGE: 12/27/1950 65 y.o. Admit date: 06/24/2016   Date of Consult: 06/24/2016 Primary Physician: No PCP Per Patient Primary Cardiologist: None  Chief Complaint: Difficulty voiding Reason for Consultation: Preoperative cardiovascular evaluation Requesting MD: Michiel CowboyShannon McGowan, PA-C  HPI: Kevin EngCarmen Poust is a 65 y.o. male with history of Coronary artery disease, congestive heart failure, atrial fibrillation, COPD, hypertension, sleep apnea, and urinary retention, whom we have been asked to evaluate for preoperative cardiovascular risk assessment in optimization in anticipation of urologic procedure for urinary retention. The patient reports an extensive cardiac history including "13-15 MIs" and 11 stents. He believes his most recent stent was placed about 1-1/2 years ago. He has received most of his cardiovascular care in [place] and [place]. In addition to his coronary artery disease, he has a history of cardiomyopathy and is status post BiV/ICD (St. Jude). He also has atrial fibrillation and is on chronic anticoagulation with rivaroxaban.  The patient reports that he has had issues with urinary retention for some time and was planning to have surgical intervention in [place] about six weeks ago. However, this was postponed due to a "heart attack" for which the patient underwent coronary [facility], and [place]. He does not believe a stent was placed, though he was told that he should not proceed with his urologic surgery at that time. He was advised to stay with family and subsequently moved to [place] to live with his daughter, who works as a Engineer, civil (consulting)nurse at [employer].  The patient has not had any chest pain for over a year despite his history of more recent heart attacks. He has chronic shortness of breath with mild exertion as well as fluctuating lower extremity edema and  orthopnea. He currently wears 24-hour oxygen (between 2-4 L via nasal cannula). He has 1-2 pillow orthopnea. He reports that his most recent LVEF was approximately 35%.  The patient was evaluated today in urology clinic due to urinary retention and difficulty voiding. Foley catheter could not be passed. Therefore, he was referred to the emergency department for admission in anticipation of cystoscopy with urethral dilation tomorrow.  Past Medical History:  Diagnosis Date  . Acid reflux   . Anxiety   . Arthritis   . Atrial fibrillation (HCC)   . CHF (congestive heart failure) (HCC)   . Clotting disorder (HCC)   . COPD (chronic obstructive pulmonary disease) (HCC)   . Depression   . Elevated PSA   . Heart attack   . Heart disease   . Heart failure (HCC)   . Hepatitis   . High cholesterol   . Hypertension   . Sleep apnea   . Urinary retention       Surgical History:  Past Surgical History:  Procedure Laterality Date  . BLADDER SURGERY    . cardiac stents    . EP IMPLANTABLE DEVICE     St. Jude BiV-ICD  . GREEN LIGHT LASER TURP (TRANSURETHRAL RESECTION OF PROSTATE  2016   [facility]      Home Meds: Prior to Admission medications   Medication Sig Start Date Meghin Thivierge Date Taking? Authorizing Provider  acetaminophen (TYLENOL) 325 MG tablet Take 650 mg by mouth every 6 (six) hours as needed.   Yes Historical Provider, MD  albuterol (PROVENTIL HFA;VENTOLIN HFA) 108 (90 Base) MCG/ACT inhaler Inhale 2 puffs into the lungs every 4 (four) hours as needed for  shortness of breath.   Yes Historical Provider, MD  carvedilol (COREG) 6.25 MG tablet Take 6.25 mg by mouth 2 (two) times daily.   Yes Historical Provider, MD  clonazePAM (KLONOPIN) 1 MG tablet Take 1 mg by mouth 3 (three) times daily.   Yes Historical Provider, MD  clopidogrel (PLAVIX) 75 MG tablet Take 75 mg by mouth daily.   Yes Historical Provider, MD  divalproex (DEPAKOTE ER) 500 MG 24 hr tablet Take by mouth 2 (two) times daily.    Yes Historical Provider, MD  furosemide (LASIX) 40 MG tablet Take 40 mg by mouth 2 (two) times daily.   Yes Historical Provider, MD  gabapentin (NEURONTIN) 300 MG capsule Take 300 mg by mouth 3 (three) times daily.   Yes Historical Provider, MD  hydrALAZINE (APRESOLINE) 25 MG tablet Take 25 mg by mouth 3 (three) times daily.   Yes Historical Provider, MD  Ipratropium-Albuterol (ALBUTEROL-IPRATROPIUM IN) Inhale into the lungs 4 (four) times daily.   Yes Historical Provider, MD  losartan (COZAAR) 100 MG tablet Take 100 mg by mouth daily.   Yes Historical Provider, MD  Melatonin 5 MG TABS Take 1 tablet by mouth at bedtime.   Yes Historical Provider, MD  nitroGLYCERIN (NITROSTAT) 0.4 MG SL tablet Place 0.4 mg under the tongue every 5 (five) minutes as needed for chest pain.   Yes Historical Provider, MD  omeprazole (PRILOSEC) 40 MG capsule Take 40 mg by mouth daily.   Yes Historical Provider, MD  potassium chloride SA (K-DUR,KLOR-CON) 20 MEQ tablet Take 20 mEq by mouth 2 (two) times daily.   Yes Historical Provider, MD  rivaroxaban (XARELTO) 20 MG TABS tablet Take 20 mg by mouth at bedtime.   Yes Historical Provider, MD  rosuvastatin (CRESTOR) 20 MG tablet Take 20 mg by mouth daily.   Yes Historical Provider, MD  tamsulosin (FLOMAX) 0.4 MG CAPS capsule Take 1 capsule (0.4 mg total) by mouth daily after supper. 06/19/16  Yes Willy Eddy, MD  traMADol (ULTRAM) 50 MG tablet Take 50 mg by mouth every 6 (six) hours as needed.   Yes Historical Provider, MD  phenazopyridine (PYRIDIUM) 200 MG tablet Take 1 tablet (200 mg total) by mouth 3 (three) times daily as needed for pain. 06/24/16   Arnaldo Natal, MD    Allergies:  Allergies  Allergen Reactions  . Codeine Nausea And Vomiting  . Other Itching    Mango skin    Social History   Social History  . Marital status: Divorced    Spouse name: N/A  . Number of children: N/A  . Years of education: N/A   Occupational History  . Not on file.    Social History Main Topics  . Smoking status: Former Smoker    Types: Cigarettes    Quit date: 08/2015  . Smokeless tobacco: Never Used  . Alcohol use Yes  . Drug use: No  . Sexual activity: No   Other Topics Concern  . Not on file   Social History Narrative  . No narrative on file     Family History  Problem Relation Age of Onset  . Other Father     Cerebral hemorrhage  . Kidney cancer Neg Hx   . Kidney disease Neg Hx   . Prostate cancer Neg Hx      Review of Systems: A 12-system review of systems was performed and is negative except as noted in the HPI.  Labs: No results for input(s): CKTOTAL, CKMB, TROPONINI in the  last 72 hours. Lab Results  Component Value Date   WBC 9.3 06/24/2016   HGB 12.2 (L) 06/24/2016   HCT 36.8 (L) 06/24/2016   MCV 82.3 06/24/2016   PLT 219 06/24/2016     Recent Labs Lab 06/24/16 1307  NA 139  K 4.2  CL 106  CO2 27  BUN 20  CREATININE 1.13  CALCIUM 9.4  PROT 7.5  BILITOT 0.3  ALKPHOS 43  ALT 12*  AST 21  GLUCOSE 106*   Radiology/Studies:  Dg Chest 2 View  Result Date: 06/19/2016 CLINICAL DATA:  65 year old male with COPD and CHF. Concern for pneumonia. EXAM: CHEST  2 VIEW COMPARISON:  None. FINDINGS: The lungs are clear. There is no pleural effusion or pneumothorax. The cardiac silhouette is within normal limits. Coronary stent noted. Left pectoral AICD device. No acute osseous pathology. IMPRESSION: No active cardiopulmonary disease. Electronically Signed   By: Elgie CollardArash  Radparvar M.D.   On: 06/19/2016 00:20    Wt Readings from Last 3 Encounters:  06/24/16 260 lb (117.9 kg)  06/24/16 263 lb 12.8 oz (119.7 kg)  06/18/16 240 lb (108.9 kg)    EKG: Atrial fibrillation with biventricular pacing.   Physical Exam: Blood pressure 110/61, pulse 71, temperature 97.6 F (36.4 C), temperature source Oral, resp. rate 18, height 5\' 8"  (1.727 m), weight 260 lb (117.9 kg), SpO2 99 %. Body mass index is 39.53 kg/m. General:  Obese man, lying comfortably on gurney. Head: Normocephalic, atraumatic, sclera non-icteric, no xanthomas, nares are without discharge.  Neck: Negative for carotid bruits. JVP approximately 8-10 cm with positive HJR. Lungs: Fair air movement. No wheezes alert or crackles. Heart: RRR with S1 S2. No murmurs, rubs, or gallops appreciated. Abdomen: Soft, with mild suprapubic tenderness. No abdominal distention. Unable to assess hepatosplenomegaly due to body habitus. Msk:  Strength and tone appear normal for age. Extremities: No clubbing or cyanosis. 2+ pretibial edema.  Distal pedal pulses are 2+ and equal bilaterally. Neuro: Alert and oriented X 3. No facial asymmetry. No focal deficit. Moves all extremities spontaneously. Psych:  Responds to questions appropriately with a normal affect.    Assessment and Plan  65 year old man with complex cardiac history including coronary artery disease with multiple MIs and PCI's, chronic systolic heart failure (presumably due to ischemic cardiomyopathy), and permanent atrial fibrillation with biventricular pacing, who is been admitted for urinary retention with plans to undergo cystoscopy and urethral dilation tomorrow.  Preoperative cardiovascular evaluation: Given the patient's extensive cardiac history and report of myocardial infarction within the last six weeks, he is high risk for any surgery and elective procedures should be delayed if possible. However, if it is felt that delaying surgical intervention places the patient at significant risk for urologic/renal complications, then surgery should proceed on an emergent basis. Please note that it typically takes approximately 48 hours for anticoagulation effects of rivaroxaban to dissipate; he took his last dose yesterday evening. The patient is also on clopidogrel; it typically takes 5-7 days for normal platelet function to return after discontinuation of this medication. I would advocate for continuation of  clopidogrel or at least low-dose aspirin in the perioperative period given the patient's history of CAD and recent MI. Ideally, we would wait until records have been received from prior hospitalizations in order to better understand his cardiac history. I do not feel that additional cardiac testing is warranted until we have a had a chance to review his outside records.  Coronary artery disease: The patient's not have chest  pain or worsening shortness of breath to suggest unstable CAD. However, he reports an MI within the last six weeks. Though he does not believe stent was placed at that time, the findings of cardiac catheterization at that time are unclear. We have requested records from [facility]. In the meantime, I would advocate for continuation of clopidogrel plus either rivaroxaban or aspirin unless significant bleeding were to occur.  Chronic systolic heart failure: The patient appears volume overloaded on exam with lower extremity edema and mildly elevated JVP as well as a history of orthopnea and exertional dyspnea. Ideally, we would initiate more aggressive diuresis though I am hesitant to do this in the setting of his bladder outlet obstruction. IV fluids should be minimized and diuresis initiated once his urologic issues have been addressed. The patient should remain on carvedilol, losartan, and hydralazine.  Permanent atrial fibrillation: The patient reports a long history of atrial fibrillation, noted on recent EKG. I would advocate for continuation of rivaroxaban once felt to be safe from a surgical standpoint, given his CHADSVASC score of at least 3. I will hold tonight's dose in anticipation of urologic intervention tomorrow.  Hypertension: Blood pressure is well controlled. I would not make any changes to his current medication regimen.   Zenovia Jordan Starsky Nanna MD 06/24/2016, 6:34 PM Pager: [phone]

## 2016-06-24 NOTE — ED Notes (Signed)
Dr. Darnelle CatalanMalinda attempted to insert foley catheter without success..Marland Kitchen

## 2016-06-24 NOTE — H&P (Signed)
H&P  Chief Complaint: Difficulty urinating  History of Present Illness: Camelia EngCarmen Rossitto is a 65 y.o. year old with an extensive cardiac history including CHF, CAD, multiple MIs, COPD who is  in the emergency room with difficulty emptying his bladder and difficult Foley placement.  This is his second time in the emergency room within 1 week with similar type symptoms.  After his first ER visit, he was referred to [facility] for [facility] evaluation. In our office earlier today, he is found to have greater than 770 cc milliliters in his bladder and was quite uncomfortable. Catheter was unable to be placed despite multiple attempts. He was sent to the emergency room for further evaluation/management. In the emergency room, Dr. Juliette AlcideMelinda attempted a 16 French catheter but was also unsuccessful.   Upon my evaluation, the patient had approximately 600 cc of light pink urine and 2 different urinals. Bladder scan performed at the bedside personally showed proximally 125 cc in his bladder. He was not distended and much more comfortable.  He does complain of severe burning with urination.  He is a poor historian and has recently relocated from [place].  I do not have his records available to me at this visit from his urologist in [place].  He states that for the last month he's been having extreme difficulty with passing his urine. He states it has been foul-smelling and had blood.  According to the emergency room records it stated that he had a history of prostate cancer and a partial cystectomy.  Upon further questioning with the patient, it was most likely a DIVU for urethral strictures.  He has a significant cardiac history and states he is on several blood thinners including Xarelto and Plavix.   he states that he was supposed to have a procedure, but this was canceled due to another MI just following cardiac clearance for the procedure.  His IPSS score today is 35, which is severe  lower urinary tract symptomatology. He is terrible with his quality life due to his urinary symptoms.  His major complaint today is inability to pass urine and suprapubic pain.   He currently taking tamsulosin.   He also denies any recent fevers, chills, nausea or vomiting.  Urinalysis from 06/18/2016 shows no evidence of infection. UA today is unremarkable other than for blood secondary to traumatic Foley attempt.   Past Medical History:  Diagnosis Date  . Acid reflux   . Anxiety   . Arthritis   . Atrial fibrillation (HCC)   . CHF (congestive heart failure) (HCC)   . Clotting disorder (HCC)   . COPD (chronic obstructive pulmonary disease) (HCC)   . Depression   . Elevated PSA   . Heart attack   . Heart disease   . Heart failure (HCC)   . Hepatitis   . High cholesterol   . Hypertension   . Sleep apnea   . Urinary retention     Past Surgical History:  Procedure Laterality Date  . BLADDER SURGERY    . cardiac stents    . GREEN LIGHT LASER TURP (TRANSURETHRAL RESECTION OF PROSTATE  2016   [facility]     Home Medications:  Current Meds  Medication Sig  . acetaminophen (TYLENOL) 325 MG tablet Take 650 mg by mouth every 6 (six) hours as needed.  Marland Kitchen. albuterol (PROVENTIL HFA;VENTOLIN HFA) 108 (90 Base) MCG/ACT inhaler Inhale 2 puffs into the lungs every 4 (four) hours as needed for shortness of  breath.  . carvedilol (COREG) 6.25 MG tablet Take 6.25 mg by mouth 2 (two) times daily.  . clonazePAM (KLONOPIN) 1 MG tablet Take 1 mg by mouth 3 (three) times daily.  . clopidogrel (PLAVIX) 75 MG tablet Take 75 mg by mouth daily.  . divalproex (DEPAKOTE ER) 500 MG 24 hr tablet Take by mouth 2 (two) times daily.  . furosemide (LASIX) 40 MG tablet Take 40 mg by mouth 2 (two) times daily.  Marland Kitchen gabapentin (NEURONTIN) 300 MG capsule Take 300 mg by mouth 3 (three) times daily.  . hydrALAZINE (APRESOLINE) 25 MG tablet Take 25 mg by mouth 3 (three) times daily.  . Ipratropium-Albuterol  (ALBUTEROL-IPRATROPIUM IN) Inhale into the lungs 4 (four) times daily.  Marland Kitchen losartan (COZAAR) 100 MG tablet Take 100 mg by mouth daily.  . Melatonin 5 MG TABS Take 1 tablet by mouth at bedtime.  . nitroGLYCERIN (NITROSTAT) 0.4 MG SL tablet Place 0.4 mg under the tongue every 5 (five) minutes as needed for chest pain.  Marland Kitchen omeprazole (PRILOSEC) 40 MG capsule Take 40 mg by mouth daily.  . potassium chloride SA (K-DUR,KLOR-CON) 20 MEQ tablet Take 20 mEq by mouth 2 (two) times daily.  . rivaroxaban (XARELTO) 20 MG TABS tablet Take 20 mg by mouth at bedtime.  . rosuvastatin (CRESTOR) 20 MG tablet Take 20 mg by mouth daily.  . tamsulosin (FLOMAX) 0.4 MG CAPS capsule Take 1 capsule (0.4 mg total) by mouth daily after supper.  . traMADol (ULTRAM) 50 MG tablet Take 50 mg by mouth every 6 (six) hours as needed.    Allergies:  Allergies  Allergen Reactions  . Codeine Nausea And Vomiting  . Other Itching    Mango skin    Family History  Problem Relation Age of Onset  . Kidney cancer Neg Hx   . Kidney disease Neg Hx   . Prostate cancer Neg Hx     Social History:  reports that he has quit smoking. He has never used smokeless tobacco. He reports that he drinks alcohol. He reports that he does not use drugs.  ROS: A complete review of systems was performed.  All systems are negative except for pertinent findings as noted.  Physical Exam:  Vital signs in last 24 hours: Temp:  [97.6 F (36.4 C)] 97.6 F (36.4 C) (12/20 1246) Pulse Rate:  [60-83] 80 (12/20 1511) Resp:  [17-18] 18 (12/20 1511) BP: (101-153)/(59-98) 121/79 (12/20 1511) SpO2:  [96 %-100 %] 98 % (12/20 1511) Weight:  [260 lb (117.9 kg)-263 lb 12.8 oz (119.7 kg)] 260 lb (117.9 kg) (12/20 1247) Constitutional:  Alert and oriented, No acute distress HEENT: [place] AT, moist mucus membranes.  Trachea midline, no masses Cardiovascular: Regular rate and rhythm.  Bilateral significant lower extremity edema appreciated, pitting. Respiratory:  Normal respiratory effort, lungs clear bilaterally GI: Abdomen is soft, nontender, nondistended, no abdominal masses GU: No CVA tenderness.  Circumcised phallus with orthotopic patent meatus. Testicles descended bilaterally. Skin: No rashes, bruises or suspicious lesions Neurologic: Grossly intact, no focal deficits, moving all 4 extremities Psychiatric: Normal mood and affect   Laboratory Data:   Recent Labs  06/24/16 1307  WBC 9.3  HGB 12.2*  HCT 36.8*    Recent Labs  06/24/16 1307  NA 139  K 4.2  CL 106  CO2 27  GLUCOSE 106*  BUN 20  CREATININE 1.13  CALCIUM 9.4    Recent Labs  06/24/16 1307  INR 1.43   Component     Latest  Ref Rng & Units 06/24/2016  Color, Urine     YELLOW STRAW (A)  Appearance     CLEAR CLEAR (A)  Specific Gravity, Urine     1.005 - 1.030 1.010  pH     5.0 - 8.0 7.0  Glucose     NEGATIVE mg/dL NEGATIVE  Hgb urine dipstick     NEGATIVE LARGE (A)  Bilirubin Urine     NEGATIVE NEGATIVE  Ketones, ur     NEGATIVE mg/dL NEGATIVE  Protein     NEGATIVE mg/dL NEGATIVE  Nitrite     NEGATIVE NEGATIVE  Leukocytes, UA     NEGATIVE NEGATIVE  RBC / HPF     0 - 5 RBC/hpf TOO NUMEROUS TO COUNT  WBC, UA     0 - 5 WBC/hpf 0-5  Bacteria, UA     NONE SEEN NONE SEEN  Squamous Epithelial / LPF     NONE SEEN NONE SEEN  Mucous      PRESENT  Hyaline Casts, UA      PRESENT    Radiologic Imaging: No results found.  Impression/ Plan:  65 year old male with extensive cardiac history with difficulty voiding, incomplete bladder emptying with urinary retention, urethral stricture. With significant difficulty, he ultimately was able to adequately empty his bladder in the emergency room this afternoon.  1. Urethral stricture/incomplete bladder emptying- given inability to place a catheter at the bedside and his ongoing severe urinary complaints, the patient needs cystoscopy, urethral dilation versus DVIU in the operating room. Risk and benefits of  this were discussed in detail with the patient. Unfortunately, he will not be able to hold anticoagulation and therefore we will manage his hematuria which will likely result.    NPO at MN, gentle IV fluids to be given once nothing by mouth  Consent order placed.  Risk and benefits were discussed.  If the patient is not a candidate for general anesthesia requires additional testing, we'll consider interventional radiology placement of ultrasound-guided suprapubic tube for temporization   2. CAD/ MI/ CHF- cardiology consult placed for preoperative optimization, discuss case with Dr.End who will see the patient today  Admit patient for observation to help facilitate/expedite management of #1.  3. Medical comorbidities- will continue all home medications  06/24/2016, 4:32 PM  Vanna ScotlandAshley Amran Malter,  MD

## 2016-06-24 NOTE — ED Triage Notes (Signed)
[facility] ED [facility] with c/o urinary retention, states was seen here for the same on Sunday and referred to urologist, states they were not able to pull up records from visit and was not able to get a foley catheter in to relieve his bladder.

## 2016-06-24 NOTE — ED Notes (Signed)
Patient given ED sandwich tray and diet gingerale per request.
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
## 2016-06-25 ENCOUNTER — Ambulatory Visit: Admit: 2016-06-25 | Payer: BLUE CROSS/BLUE SHIELD | Admitting: Urology

## 2016-06-25 ENCOUNTER — Observation Stay: Payer: BLUE CROSS/BLUE SHIELD | Admitting: Anesthesiology

## 2016-06-25 ENCOUNTER — Other Ambulatory Visit: Payer: Self-pay | Admitting: Urology

## 2016-06-25 ENCOUNTER — Encounter: Payer: Self-pay | Admitting: Anesthesiology

## 2016-06-25 ENCOUNTER — Encounter
Admission: EM | Disposition: A | Discharge status: Home / Self Care | Payer: Self-pay | Source: Home / Self Care | Attending: Emergency Medicine

## 2016-06-25 DIAGNOSIS — N358 Other urethral stricture: Secondary | ICD-10-CM | POA: Diagnosis not present

## 2016-06-25 DIAGNOSIS — R339 Retention of urine, unspecified: Secondary | ICD-10-CM | POA: Diagnosis not present

## 2016-06-25 HISTORY — PX: CYSTOSCOPY WITH URETHRAL DILATATION: SHX5125

## 2016-06-25 LAB — SURGICAL PCR SCREEN
MRSA, PCR: NEGATIVE
Staphylococcus aureus: NEGATIVE

## 2016-06-25 SURGERY — CYSTOSCOPY, WITH URETHRAL DILATION
Anesthesia: Monitor Anesthesia Care

## 2016-06-25 SURGERY — CYSTOSCOPY, WITH URETHRAL DILATION
Anesthesia: Choice

## 2016-06-25 MED ORDER — LIDOCAINE HCL 2 % EX GEL
CUTANEOUS | Status: AC
  Filled 2016-06-25: qty 10

## 2016-06-25 MED ORDER — OXYCODONE HCL 5 MG PO TABS: 5.0000 mg | ORAL_TABLET | ORAL | 0 refills | Status: DC | PRN

## 2016-06-25 MED ORDER — MIDAZOLAM HCL 2 MG/2ML IJ SOLN
INTRAMUSCULAR | Status: DC | PRN
  Administered 2016-06-25: 2 mg via INTRAVENOUS

## 2016-06-25 MED ORDER — PROPOFOL 500 MG/50ML IV EMUL
INTRAVENOUS | Status: AC
  Filled 2016-06-25: qty 50

## 2016-06-25 MED ORDER — LIDOCAINE 2% (20 MG/ML) 5 ML SYRINGE
INTRAMUSCULAR | Status: AC
  Filled 2016-06-25: qty 5

## 2016-06-25 MED ORDER — LACTATED RINGERS IV SOLN
INTRAVENOUS | Status: DC
  Administered 2016-06-25: 13:00:00 via INTRAVENOUS

## 2016-06-25 MED ORDER — PROPOFOL 10 MG/ML IV BOLUS
INTRAVENOUS | Status: DC | PRN
  Administered 2016-06-25 (×2): 20 mg via INTRAVENOUS
  Administered 2016-06-25: 10 mg via INTRAVENOUS
  Administered 2016-06-25: 20 mg via INTRAVENOUS

## 2016-06-25 MED ORDER — HYDROMORPHONE HCL 2 MG PO TABS: ORAL_TABLET | ORAL | 0 refills | Status: DC

## 2016-06-25 MED ORDER — FENTANYL CITRATE (PF) 100 MCG/2ML IJ SOLN
INTRAMUSCULAR | Status: AC
  Filled 2016-06-25: qty 2

## 2016-06-25 MED ORDER — FENTANYL CITRATE (PF) 100 MCG/2ML IJ SOLN
INTRAMUSCULAR | Status: DC | PRN
Start: 2016-06-25 — End: 2016-06-25
  Administered 2016-06-25 (×2): 50 ug via INTRAVENOUS

## 2016-06-25 MED ORDER — MIDAZOLAM HCL 2 MG/2ML IJ SOLN
INTRAMUSCULAR | Status: AC
  Filled 2016-06-25: qty 2

## 2016-06-25 MED ORDER — LIDOCAINE HCL 2 % EX GEL
CUTANEOUS | Status: DC | PRN
  Administered 2016-06-25: 1 via URETHRAL

## 2016-06-25 SURGICAL SUPPLY — 27 items
BAG DRAIN CYSTO-URO LG1000N (MISCELLANEOUS) ×2 IMPLANT
BAG URO DRAIN 2000ML W/SPOUT (MISCELLANEOUS) ×2 IMPLANT
BASIN GRAD PLASTIC 32OZ STRL (MISCELLANEOUS) IMPLANT
CATH FOL 2WAY LX 16X5 (CATHETERS) IMPLANT
CATH SET URETHRAL DILATOR (CATHETERS) ×2 IMPLANT
CATH URETHRAL DIL 7.0X29 (CATHETERS) ×2 IMPLANT
CATH URETL 5X70 OPEN END (CATHETERS) ×2 IMPLANT
CONRAY 43 FOR UROLOGY 50M (MISCELLANEOUS) ×2 IMPLANT
DRAPE SHEET LG 3/4 BI-LAMINATE (DRAPES) IMPLANT
ELECT REM PT RETURN 9FT ADLT (ELECTROSURGICAL) ×2
ELECTRODE REM PT RTRN 9FT ADLT (ELECTROSURGICAL) ×1 IMPLANT
GLOVE BIO SURGEON STRL SZ 6.5 (GLOVE) ×4 IMPLANT
GLOVE BIO SURGEON STRL SZ7 (GLOVE) ×4 IMPLANT
GOWN STRL REUS W/ TWL LRG LVL3 (GOWN DISPOSABLE) ×2 IMPLANT
GOWN STRL REUS W/TWL LRG LVL3 (GOWN DISPOSABLE) ×2
GUIDEWIRE SUPER STIFF (WIRE) ×2 IMPLANT
KIT RM TURNOVER CYSTO AR (KITS) ×2 IMPLANT
PACK CYSTO AR (MISCELLANEOUS) ×2 IMPLANT
SET CYSTO W/LG BORE CLAMP LF (SET/KITS/TRAYS/PACK) ×2 IMPLANT
SOL PREP PVP 2OZ (MISCELLANEOUS)
SOLUTION PREP PVP 2OZ (MISCELLANEOUS) IMPLANT
SURGILUBE 2OZ TUBE FLIPTOP (MISCELLANEOUS) ×2 IMPLANT
SYR 30ML LL (SYRINGE) ×2 IMPLANT
SYRINGE 10CC LL (SYRINGE) ×2 IMPLANT
SYRINGE IRR TOOMEY STRL 70CC (SYRINGE) ×2 IMPLANT
WATER STERILE IRR 1000ML POUR (IV SOLUTION) ×2 IMPLANT
WATER STERILE IRR 3000ML UROMA (IV SOLUTION) ×2 IMPLANT

## 2016-06-25 NOTE — Progress Notes (Signed)
[facility]  Patient: Kevin EngCarmen Pearson / Admit Date: 06/24/2016 / Date of Encounter: 06/25/2016, 1:23 PM   Subjective: Patient is still having significant urologic issues and currently has had no evidence of cardiovascular symptoms of heart failure chest pain weakness or fatigue  Review of Systems: Positive for: Urinary retention Negative for: Vision change, hearing change, syncope, dizziness, nausea, vomiting,diarrhea, bloody stool, stomach pain, cough, congestion, diaphoresis, urinary frequency,  skin lesions, skin rashes Others previously listed  Objective: Physical Exam: Blood pressure 126/86, pulse 70, temperature (!) 96.8 F (36 C), resp. rate 10, height 5\' 8"  (1.727 m), weight 117.9 kg (260 lb), SpO2 99 %. Body mass index is 39.53 kg/m. General: Well developed, well nourished, in no acute distress. Head: Normocephalic, atraumatic, sclera non-icteric, no xanthomas, nares are without discharge. Neck: No apparent masses Lungs: Normal respirations with no wheezes, no rhonchi, no rales , no crackles   Heart: irregular rate and rhythm, normal S1 S2, no murmur, no rub, no gallop, PMI is normal size and placement, carotid upstroke normal without bruit, jugular venous pressure normal Abdomen: Soft, non-tender, non-distended with normoactive bowel sounds. No hepatosplenomegaly. Abdominal aorta is normal size without bruit Extremities: Trace edema, no clubbing, no cyanosis, no ulcers,  Peripheral: 2+ radial, 2+ femoral, 2+ dorsal pedal pulses Neuro: Alert and oriented. Moves all extremities spontaneously. Psych:  Responds to questions appropriately with a normal affect.   Intake/Output Summary (Last 24 hours) at 06/25/16 1323 Last data filed at 06/25/16 1313  Gross per 24 hour  Intake              520 ml  Output             1575 ml  Net            -1055 ml    Inpatient Medications:  . [MAR Hold] carvedilol  6.25 mg Oral BID  . [MAR Hold]  clonazePAM  1 mg Oral TID  . [MAR Hold] clopidogrel  75 mg Oral Daily  . [MAR Hold] divalproex  500 mg Oral Daily  . [MAR Hold] docusate sodium  100 mg Oral BID  . [MAR Hold] furosemide  40 mg Oral BID  . [MAR Hold] gabapentin  300 mg Oral TID  . [MAR Hold] hydrALAZINE  25 mg Oral TID  . [MAR Hold] losartan  100 mg Oral Daily  . [MAR Hold] pantoprazole  80 mg Oral Daily  . [MAR Hold] phenazopyridine  100 mg Oral TID WC  . [MAR Hold] potassium chloride SA  20 mEq Oral BID  . [MAR Hold] rosuvastatin  20 mg Oral Daily  . [facility][MAR Hold] tamsulosin  0.4 mg Oral QPC supper   Infusions:  . lactated ringers Stopped (06/25/16 1313)    Labs:  Recent Labs  06/24/16 1307  NA 139  K 4.2  CL 106  CO2 27  GLUCOSE 106*  BUN 20  CREATININE 1.13  CALCIUM 9.4    Recent Labs  06/24/16 1307  AST 21  ALT 12*  ALKPHOS 43  BILITOT 0.3  PROT 7.5  ALBUMIN 3.8    Recent Labs  06/24/16 1307  WBC 9.3  NEUTROABS 5.9  HGB 12.2*  HCT 36.8*  MCV 82.3  PLT 219   No results for input(s): CKTOTAL, CKMB, TROPONINI in the last 72 hours. Invalid input(s): POCBNP No results for input(s): HGBA1C in the last 72 hours.   Weights: Filed Weights   06/24/16 1247 06/25/16 1122  Weight: 117.9 kg (  260 lb) 117.9 kg (260 lb)     Radiology/Studies:  Dg Chest 2 View  Result Date: 06/19/2016 CLINICAL DATA:  65 year old male with COPD and CHF. Concern for pneumonia. EXAM: CHEST  2 VIEW COMPARISON:  None. FINDINGS: The lungs are clear. There is no pleural effusion or pneumothorax. The cardiac silhouette is within normal limits. Coronary stent noted. Left pectoral AICD device. No acute osseous pathology. IMPRESSION: No active cardiopulmonary disease. Electronically Signed   By: Elgie CollardArash  Radparvar M.D.   On: 06/19/2016 00:20     Assessment and Recommendation  65 y.o. male with atrial fibrillation chronic systolic dysfunction heart failure sleep apnea coronary artery disease status post myocardial  infarctions and stenting currently stable from a cardiac standpoint with no evidence of significant progression of symptoms although recently had a myocardial infarction within the last month on antiplatelet and anticoagulation at moderate to high risk of cardiovascular complication with invasive procedures. Patient understands this and we'll further treat urinary retention as best possible with lowest risk procedures 1. For further risk reduction in bleeding complications with urinary retention cystoscopy and other issues would hold anticoagulation and consider discontinuation of Plavix temporarily until no further bleeding occurs. Would continue aspirin throughout this period of time with procedures due to concerns of vascular disease and recent myocardial infarction 2. Continue carvedilol for her chronic systolic dysfunction congestive heart failure through surgery. 3. No further cardiac diagnostics are necessary at this time 4. High intensity cholesterol therapy without change 5. Further follow-up atrial fibrillation with rapid rate and adjustments of carvedilol as necessary post surgery 6. Reinstatement of Plavix and anticoagulation when able  Signed, Arnoldo HookerBruce Ernest Popowski M.D. FACC

## 2016-06-25 NOTE — Discharge Summary (Signed)
Date of admission: 06/24/2016  Date of discharge: 06/25/2016  Admission diagnosis: Urinary retention, A. fib, chronic diastolic dysfunction heart failure, CAD, status post MI times multiple  Discharge diagnosis: Same, including prostatic urethra/bladder neck contracture  Secondary diagnoses:  Patient Active Problem List   Diagnosis Date Noted  . Urinary retention 06/24/2016    History and Physical: For full details, please see admission history and physical. Briefly, Kevin Pearson is a 65 y.o. year old patient with Severe difficulty voiding and urinary retention. He was admitted for cardiac evaluation and optimization. He was taken to the operating room on postop day 1 for urethral dilation under monitored anesthesia care. His anticoagulation was continued although his Xarelto was held on the evening prior to surgery.Marland Kitchen   Hospital Course: Patient tolerated the procedure well.  He was then transferred to the floor after an uneventful PACU stay.  His hospital course was uncomplicated.  On POD#0 he had met discharge criteria: was eating a regular diet, was up and ambulating independently,  pain was well controlled, catheter draining light pink urine, and was ready to for discharge.   Laboratory values:   Recent Labs  06/24/16 1307  WBC 9.3  HGB 12.2*  HCT 36.8*    Recent Labs  06/24/16 1307  NA 139  K 4.2  CL 106  CO2 27  GLUCOSE 106*  BUN 20  CREATININE 1.13  CALCIUM 9.4    Recent Labs  06/24/16 1307  INR 1.43   No results for input(s): LABURIN in the last 72 hours. Results for orders placed or performed during the hospital encounter of 06/24/16  Surgical PCR screen     Status: None   Collection Time: 06/24/16 11:38 PM  Result Value Ref Range Status   MRSA, PCR NEGATIVE NEGATIVE Final   Staphylococcus aureus NEGATIVE NEGATIVE Final    Comment:        The Xpert SA Assay (FDA approved for NASAL specimens in patients over 69 years of age), is one component of a  comprehensive surveillance program.  Test performance has been validated by [facility] for patients greater than or equal to 67 year old. It is not intended to diagnose infection nor to guide or monitor treatment.     Disposition: Home  Discharge instruction: Routine Foley care.  Continue all home meds including antiplatelet/ anticoagulation  Discharge medications: Allergies as of 06/25/2016      Reactions   Codeine Nausea And Vomiting   Other Itching   Mango skin      Medication List    TAKE these medications   acetaminophen 325 MG tablet Commonly known as:  TYLENOL Take 650 mg by mouth every 6 (six) hours as needed.   albuterol 108 (90 Base) MCG/ACT inhaler Commonly known as:  PROVENTIL HFA;VENTOLIN HFA Inhale 2 puffs into the lungs every 4 (four) hours as needed for shortness of breath.   ALBUTEROL-IPRATROPIUM IN Inhale into the lungs 4 (four) times daily.   carvedilol 6.25 MG tablet Commonly known as:  COREG Take 6.25 mg by mouth 2 (two) times daily.   clonazePAM 1 MG tablet Commonly known as:  KLONOPIN Take 1 mg by mouth 3 (three) times daily.   clopidogrel 75 MG tablet Commonly known as:  PLAVIX Take 75 mg by mouth daily.   divalproex 500 MG 24 hr tablet Commonly known as:  DEPAKOTE ER Take by mouth 2 (two) times daily.   furosemide 40 MG tablet Commonly known as:  LASIX Take 40 mg by mouth  2 (two) times daily.   gabapentin 300 MG capsule Commonly known as:  NEURONTIN Take 300 mg by mouth 3 (three) times daily.   hydrALAZINE 25 MG tablet Commonly known as:  APRESOLINE Take 25 mg by mouth 3 (three) times daily.   losartan 100 MG tablet Commonly known as:  COZAAR Take 100 mg by mouth daily.   Melatonin 5 MG Tabs Take 1 tablet by mouth at bedtime.   nitroGLYCERIN 0.4 MG SL tablet Commonly known as:  NITROSTAT Place 0.4 mg under the tongue every 5 (five) minutes as needed for chest pain.   omeprazole 40 MG capsule Commonly known as:   PRILOSEC Take 40 mg by mouth daily.   oxyCODONE 5 MG immediate release tablet Commonly known as:  ROXICODONE Take 1 tablet (5 mg total) by mouth every 4 (four) hours as needed.   phenazopyridine 200 MG tablet Commonly known as:  PYRIDIUM Take 1 tablet (200 mg total) by mouth 3 (three) times daily as needed for pain.   potassium chloride SA 20 MEQ tablet Commonly known as:  K-DUR,KLOR-CON Take 20 mEq by mouth 2 (two) times daily.   rosuvastatin 20 MG tablet Commonly known as:  CRESTOR Take 20 mg by mouth daily.   tamsulosin 0.4 MG Caps capsule Commonly known as:  FLOMAX Take 1 capsule (0.4 mg total) by mouth daily after supper.   traMADol 50 MG tablet Commonly known as:  ULTRAM Take 50 mg by mouth every 6 (six) hours as needed.   XARELTO 20 MG Tabs tablet Generic drug:  rivaroxaban Take 20 mg by mouth at bedtime.       Followup:  Follow-up Information    Hollice Espy, MD On 07/09/2016.   Specialty:  Urology Why:  @ 2:30 am.  Contact information: [address] [place] [address] [phone]
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
## 2016-06-25 NOTE — Discharge Instructions (Signed)
Foley Catheter Care, Adult °A Foley catheter is a soft, flexible tube. This tube is placed into your bladder to drain pee (urine). If you go home with this catheter in place, follow the instructions below. °TAKING CARE OF THE CATHETER °1. Wash your hands with soap and water. °2. Put soap and water on a clean washcloth. °¨ Clean the skin where the tube goes into your body. °§ Clean away from the tube site. °§ Never wipe toward the tube. °§ Clean the area using a circular motion. °¨ Remove all the soap. Pat the area dry with a clean towel. For males, reposition the skin that covers the end of the penis (foreskin). °3. Attach the tube to your leg with tape or a leg strap. Do not stretch the tube tight. If you are using tape, remove any stickiness left behind by past tape you used. °4. Keep the drainage bag below your hips. Keep it off the floor. °5. Check your tube during the day. Make sure it is working and draining. Make sure the tube does not curl, twist, or bend. °6. Do not pull on the tube or try to take it out. °TAKING CARE OF THE DRAINAGE BAGS °You will have a large overnight drainage bag and a small leg bag. You may wear the overnight bag any time. Never wear the small bag at night. Follow the directions below. °Emptying the Drainage Bag  °Empty your drainage bag when it is ?-½ full or at least 2-3 times a day. °1. Wash your hands with soap and water. °2. Keep the drainage bag below your hips. °3. Hold the dirty bag over the toilet or clean container. °4. Open the pour spout at the bottom of the bag. Empty the pee into the toilet or container. Do not let the pour spout touch anything. °5. Clean the pour spout with a gauze pad or cotton ball that has rubbing alcohol on it. °6. Close the pour spout. °7. Attach the bag to your leg with tape or a leg strap. °8. Wash your hands well. °Changing the Drainage Bag  °Change your bag once a month or sooner if it starts to smell or look dirty.  °1. Wash your hands with  soap and water. °2. Pinch the rubber tube so that pee does not spill out. °3. Disconnect the catheter tube from the drainage tube at the connection valve. Do not let the tubes touch anything. °4. Clean the end of the catheter tube with an alcohol wipe. Clean the end of a the drainage tube with a different alcohol wipe. °5. Connect the catheter tube to the drainage tube of the clean drainage bag. °6. Attach the new bag to the leg with tape or a leg strap. Avoid attaching the new bag too tightly. °7. Wash your hands well. °Cleaning the Drainage Bag  °1. Wash your hands with soap and water. °2. Wash the bag in warm, soapy water. °3. Rinse the bag with warm water. °4. Fill the bag with a mixture of white vinegar and water (1 cup vinegar to 1 quart warm water [.2 liter vinegar to 1 liter warm water]). Close the bag and soak it for 30 minutes in the solution. °5. Rinse the bag with warm water. °6. Hang the bag to dry with the pour spout open and hanging downward. °7. Store the clean bag (once it is dry) in a clean plastic bag. °8. Wash your hands well. °PREVENT INFECTION °· Wash your hands before and after touching   your tube. °· Take showers every day. Wash the skin where the tube enters your body. Do not take baths. Replace wet leg straps with dry ones, if this applies. °· Do not use powders, sprays, or lotions on the genital area. Only use creams, lotions, or ointments as told by your doctor. °· For females, wipe from front to back after going to the bathroom. °· Drink enough fluids to keep your pee clear or pale yellow unless you are told not to have too much fluid (fluid restriction). °· Do not let the drainage bag or tubing touch or lie on the floor. °· Wear cotton underwear to keep the area dry. °GET HELP IF: °· Your pee is cloudy or smells unusually bad. °· Your tube becomes clogged. °· You are not draining pee into the bag or your bladder feels full. °· Your tube starts to leak. °GET HELP RIGHT AWAY IF: °· You  have pain, puffiness (swelling), redness, or yellowish-white fluid (pus) where the tube enters the body. °· You have pain in the belly (abdomen), legs, lower back, or bladder. °· You have a fever. °· You see blood fill the tube, or your pee is pink or red. °· You feel sick to your stomach (nauseous), throw up (vomit), or have chills. °· Your tube gets pulled out. °MAKE SURE YOU:  °· Understand these instructions. °· Will watch your condition. °· Will get help right away if you are not doing well or get worse. °This information is not intended to replace advice given to you by your health care provider. Make sure you discuss any questions you have with your health care provider. °Document Released: 10/17/2012 Document Revised: 07/13/2014 Document Reviewed: 06/08/2015 °Elsevier Interactive Patient Education © 2017 [employer] ° °

## 2016-06-25 NOTE — Progress Notes (Signed)
06/25/2016 5:00 PM  Kevin Pearson to be D/C'd Home per MD order.  Discussed prescriptions and follow up appointments with the patient. Prescriptions given to patient, medication list explained in detail. Pt verbalized understanding.    Vitals:   06/25/16 1415 06/25/16 1445  BP: 126/80 (!) 154/88  Pulse: 70 70  Resp: 16 15  Temp: 97.4 F (36.3 C)     Skin clean, dry and intact without evidence of skin break down, no evidence of skin tears noted. IV catheter discontinued intact. Site without signs and symptoms of complications. Dressing and pressure applied. Pt denies pain at this time. No complaints noted.  An After Visit Summary was printed and given to the patient. Patient escorted via WC, and D/C home via private auto.  Bradly Chrisougherty, Jalee Saine E

## 2016-06-25 NOTE — Interval H&P Note (Signed)
History and Physical Interval Note:  06/25/2016 12:13 PM  Kevin Pearson  has presented today for surgery, with the diagnosis of n/a  The various methods of treatment have been discussed with the patient and family. After consideration of risks, benefits and other options for treatment, the patient has consented to  Procedure(s): CYSTOSCOPY WITH URETHRAL DILATATION (N/A) CYSTOSCOPY WITH DIRECT VISION INTERNAL URETHROTOMY (N/A) as a surgical intervention .  The patient's history has been reviewed, patient examined, no change in status, stable for surgery.  I have reviewed the patient's chart and labs.  Questions were answered to the patient's satisfaction.    RRR CTAB   Vanna ScotlandAshley Zavon Hyson

## 2016-06-25 NOTE — Anesthesia Procedure Notes (Signed)
Date/Time: 06/25/2016 12:34 PM Performed by: Stormy FabianURTIS, Braylyn Eye Pre-anesthesia Checklist: Patient identified, Emergency Drugs available, Suction available and Patient being monitored Patient Re-evaluated:Patient Re-evaluated prior to inductionOxygen Delivery Method: Nasal cannula Intubation Type: IV induction Dental Injury: Teeth and Oropharynx as per pre-operative assessment  Comments: Nasal cannula with etCO2 monitoring

## 2016-06-25 NOTE — Progress Notes (Signed)
Urology   Subjective: Continues to void but with significant difficulty, uncomfortable, complaining of lower abdominal pain and dysuria.  Postvoid residual this morning 500 cc. Nothing by mouth for possible procedure. Seen by cardiology, deemed high risk, records pending.  Anti-infectives: Anti-infectives    Start     Dose/Rate Route Frequency Ordered Stop   06/25/16 0600  ceFAZolin (ANCEF) powder 2 g     2 g Other To Surgery 06/24/16 1848 06/26/16 0600      Current Facility-Administered Medications  Medication Dose Route Frequency Provider Last Rate Last Dose  . [MAR Hold] acetaminophen (TYLENOL) tablet 650 mg  650 mg Oral Q4H PRN Vanna ScotlandAshley Chyan Carnero, MD   650 mg at 06/24/16 2022  . [MAR Hold] albuterol (PROVENTIL) (2.5 MG/3ML) 0.083% nebulizer solution 2.5 mg  2.5 mg Nebulization Q4H PRN Cindi CarbonMary M Swayne, RPH      . [MAR Hold] carvedilol (COREG) tablet 6.25 mg  6.25 mg Oral BID Vanna ScotlandAshley Amori Colomb, MD   6.25 mg at 06/25/16 1044  . ceFAZolin (ANCEF) powder 2 g  2 g Other To OR Vanna ScotlandAshley Allyana Vogan, MD      . Mitzi Hansen[MAR Hold] clonazePAM [facility](KLONOPIN) tablet 1 mg  1 mg Oral TID Vanna ScotlandAshley Kennedy Brines, MD   1 mg at 06/25/16 1044  . [MAR Hold] clopidogrel (PLAVIX) tablet 75 mg  75 mg Oral Daily Vanna ScotlandAshley Abhinav Mayorquin, MD   Stopped at 06/25/16 1000  . [MAR Hold] diphenhydrAMINE (BENADRYL) injection 12.5 mg  12.5 mg Intravenous Q6H PRN Vanna ScotlandAshley Sagrario Lineberry, MD       Or  . Mitzi Hansen[MAR Hold] diphenhydrAMINE (BENADRYL) 12.5 MG/5ML elixir 12.5 mg  12.5 mg Oral Q6H PRN Vanna ScotlandAshley Quentina Fronek, MD      . Mitzi Hansen[MAR Hold] divalproex (DEPAKOTE ER) 24 hr tablet 500 mg  500 mg Oral Daily Vanna ScotlandAshley Kemonte Ullman, MD   500 mg at 06/25/16 1044  . [MAR Hold] docusate sodium (COLACE) capsule 100 mg  100 mg Oral BID Vanna ScotlandAshley Isai Gottlieb, MD   Stopped at 06/25/16 1000  . [MAR Hold] furosemide (LASIX) tablet 40 mg  40 mg Oral BID Vanna ScotlandAshley Melchizedek Espinola, MD   40 mg at 06/25/16 [id number]  . [MAR Hold] gabapentin (NEURONTIN) capsule 300 mg  300 mg Oral TID Vanna ScotlandAshley Jissel Slavens, MD   Stopped at 06/25/16 1000  . [MAR  Hold] hydrALAZINE (APRESOLINE) tablet 25 mg  25 mg Oral TID Vanna ScotlandAshley Jaiden Dinkins, MD   Stopped at 06/25/16 1000  . [MAR Hold] losartan (COZAAR) tablet 100 mg  100 mg Oral Daily Vanna ScotlandAshley Alejandro Gamel, MD   100 mg at 06/25/16 1044  . [MAR Hold] nitroGLYCERIN (NITROSTAT) SL tablet 0.4 mg  0.4 mg Sublingual Q5 min PRN Vanna ScotlandAshley Rodell Marrs, MD      . Mitzi Hansen[MAR Hold] ondansetron [facility](ZOFRAN) injection 4 mg  4 mg Intravenous Q4H PRN Vanna ScotlandAshley Cord Wilczynski, MD      . Mitzi Hansen[MAR Hold] pantoprazole (PROTONIX) EC tablet 80 mg  80 mg Oral Daily Vanna ScotlandAshley Shaely Gadberry, MD   Stopped at 06/25/16 1000  . [MAR Hold] phenazopyridine (PYRIDIUM) tablet 100 mg  100 mg Oral TID WC Vanna ScotlandAshley Ruhan Borak, MD   100 mg at 06/25/16 [id number]  . [MAR Hold] potassium chloride SA (K-DUR,KLOR-CON) CR tablet 20 mEq  20 mEq Oral BID Vanna ScotlandAshley Manal Kreutzer, MD   Stopped at 06/25/16 1000  . [MAR Hold] rosuvastatin (CRESTOR) tablet 20 mg  20 mg Oral Daily Vanna ScotlandAshley Sireen Halk, MD   Stopped at 06/25/16 1000  . [MAR Hold] tamsulosin (FLOMAX) capsule 0.4 mg  0.4 mg Oral QPC supper Vanna ScotlandAshley Carlean Crowl, MD   0.4  mg at 06/24/16 2022  . [MAR Hold] zolpidem (AMBIEN) tablet 5 mg  5 mg Oral QHS PRN,MR X 1 Vanna ScotlandAshley Jerret Mcbane, MD   5 mg at 06/25/16 0020     Objective: Vital signs in last 24 hours: Temp:  [97.4 F (36.3 C)-98.2 F (36.8 C)] 97.6 F (36.4 C) (12/21 0808) Pulse Rate:  [60-83] 70 (12/21 0808) Resp:  [16-20] 18 (12/21 0808) BP: (101-146)/(59-98) 141/86 (12/21 0808) SpO2:  [96 %-100 %] 97 % (12/21 0808) Weight:  [260 lb (117.9 kg)] 260 lb (117.9 kg) (12/20 1247)  Intake/Output from previous day: 12/20 0701 - 12/21 0700 In: 120 [P.O.:120] Out: 1375 [Urine:1375] Intake/Output this shift: Total I/O In: -  Out: 200 [Urine:200]   Physical Exam  Constitutional:  Alert and oriented, No acute distress HEENT: [place] AT, moist mucus membranes.  Trachea midline, no masses Cardiovascular: Regular rate and rhythm.  Bilateral significant lower extremity edema appreciated, pitting. Respiratory: Normal respiratory  effort, lungs clear bilaterally GI: Abdomen is soft, obese GU: No CVA tenderness.  Circumcised phallus with orthotopic patent meatus. Testicles descended bilaterally. 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 Postvoid residual this morning is 500 cc. He is in significant discomfort.  Patient is deemed high risk for anesthesia at this point, [place].  Anticoagulation continue.  Plan: -case discussed with anesthesia this morning, we will attempt to perform cystoscopy, urethral dil sedation/monitored anesthesia care with the possibility of suprapubic tube placement if not tolerated and avoidance of general anesthesia -We'll continue anticoagulation given his high-risk patient, will manage hematuria if this is an issue -Nothing by mouth -Risk of bleeding, infection, damage to surrounding structures, recurrence of stricture, pain, need for Foley catheterization all discussed with the patient. In addition, procedure failure and recurrence of stricture was discussed at length.    LOS: 0 days    Vanna Scotlandshley Verba Ainley 06/25/2016

## 2016-06-25 NOTE — Op Note (Signed)
Date of procedure: 06/25/16  Preoperative diagnosis:  1. Urinary retention   Postoperative diagnosis:  1. Bladder neck contracture/prostatic urethral stricture 2. Urinary retention   Procedure: 1. Cystoscopy 2. Urethral dilation  Surgeon: Vanna ScotlandAshley Aahan Marques, MD  Anesthesia: MAC  Complications: None  Intraoperative findings: Near complete obliteration of prostatic urethra, pinpoint opening into the lumen of the bladder.  EBL: minimal  Specimens: none  Drains: 20 French council tip catheter  Indication: Kevin Pearson is a 65 y.o. patient with severe voiding symptoms 4 months and urinary retention. He was admitted overnight, able to void but not emptying his bladder with significant discomfort. Given his ongoing retention, the decision to proceed emergently to the operating room to perform the procedure under monitored anesthesia care was pursued..  After reviewing the management options for treatment, he elected to proceed with the above surgical procedure(s). We have discussed the potential benefits and risks of the procedure, side effects of the proposed treatment, the likelihood of the patient achieving the goals of the procedure, and any potential problems that might occur during the procedure or recuperation. Informed consent has been obtained.  Description of procedure:  The patient was taken to the operating room and sedation was provided.  The patient was placed in the dorsal lithotomy position, prepped and draped in the usual sterile fashion, and preoperative antibiotics were administered. A preoperative time-out was performed.   A 21 French cystoscope was advanced per urethra to the level of the prostatic urethra. Anatomic structures including the ureter was identified but just distal to that, there was a complete blind ending obstruction of his prostatic urethra/bladder neck. The area appeared mildly erythematous with a few punctate openings, one of which presumably representing  an opening into the bladder. He was avoiding intermittently throughout the procedure suggesting some degree of urethral patency although minimal. A Super Stiff wire was used to probe each of these openings. The wire passed easily through the most anterior opening which was barely able to accommodate a 0.038 superstiff wire. In order to confirm that the wire was in the bladder, we asked the patient if he had the sensation of discomfort in his bladder which she confirmed.  Additionally, one single fluoroscopic image was taken which revealed the curled in the bladder. At this point in time, given the severity of the stricture, serial dilators were used starting at 8 [place] and progressing up to 22 [place] in diameter which was well tolerated. A 20 [place] council tip catheter was then inserted over the Super Stiff wire into the bladder at which time orange-colored urine was drained. The wires removed and the balloon was filled with 10 cc of sterile water. The patient was then cleaned and dried, taken to the PACU in stable condition. There is very little bleeding noted in his urine remained clear.  Plan: Patient will be discharged this afternoon and follow-up in approximate 2 weeks. He will either need to learn how to self catheter or maintain the Foley catheter with exchanges over a wire until a more definitive procedure can be performed including possible lasering of bladder neck/prostatic urethral stricture. We will also need to obtain his previous medical records as its unclear why he has such a severe stricture.  Vanna ScotlandAshley Brentt Fread, M.D.

## 2016-06-25 NOTE — Transfer of Care (Signed)
Immediate Anesthesia Transfer of Care Note  Patient: Kevin Pearson  Procedure(s) Performed: Procedure(s): CYSTOSCOPY WITH URETHRAL DILATATION (N/A)  Patient Location: PACU  Anesthesia Type:General  Level of Consciousness: sedated  Airway & Oxygen Therapy: Patient Spontanous Breathing and Patient connected to face mask oxygen  Post-op Assessment: Report given to RN and Post -op Vital signs reviewed and stable  Post vital signs: Reviewed and stable  Last Vitals:  Vitals:   06/25/16 1122 06/25/16 1319  BP: 132/81 126/86  Pulse: 70 70  Resp: 18 10  Temp: 36.4 C (!) 36 C    Complications: No apparent anesthesia complications

## 2016-06-25 NOTE — Anesthesia Postprocedure Evaluation (Signed)
Anesthesia Post Note  Patient: Kevin Pearson  Procedure(s) Performed: Procedure(s) (LRB): CYSTOSCOPY WITH URETHRAL DILATATION (N/A)  Patient location during evaluation: PACU Anesthesia Type: General Level of consciousness: awake and alert Pain management: pain level controlled Vital Signs Assessment: post-procedure vital signs reviewed and stable Respiratory status: spontaneous breathing, nonlabored ventilation, respiratory function stable and patient connected to nasal cannula oxygen Cardiovascular status: blood pressure returned to baseline and stable Postop Assessment: no signs of nausea or vomiting Anesthetic complications: no     Last Vitals:  Vitals:   06/25/16 1350 06/25/16 1415  BP: 120/82 126/80  Pulse: 70 70  Resp: 16 16  Temp: 36.1 C 36.3 C    Last Pain:  Vitals:   06/25/16 1415  TempSrc: Oral  PainSc:                  Cleda MccreedyJoseph K Piscitello

## 2016-06-26 ENCOUNTER — Encounter: Payer: Self-pay | Admitting: Urology

## 2016-06-26 ENCOUNTER — Other Ambulatory Visit: Payer: Self-pay

## 2016-06-26 DIAGNOSIS — N359 Urethral stricture, unspecified: Secondary | ICD-10-CM

## 2016-06-26 LAB — URINE CULTURE
Culture: NO GROWTH
Special Requests: NORMAL

## 2016-06-26 MED ORDER — TAMSULOSIN HCL 0.4 MG PO CAPS: 0.4000 mg | ORAL_CAPSULE | Freq: Every day | ORAL | 0 refills | Status: DC

## 2016-06-27 ENCOUNTER — Other Ambulatory Visit: Payer: Self-pay | Admitting: Urology

## 2016-06-27 DIAGNOSIS — N359 Urethral stricture, unspecified: Secondary | ICD-10-CM

## 2016-07-01 ENCOUNTER — Telehealth: Payer: Self-pay | Admitting: Urology

## 2016-07-01 DIAGNOSIS — N3289 Other specified disorders of bladder: Secondary | ICD-10-CM

## 2016-07-01 NOTE — Telephone Encounter (Signed)
Patient's daughter, Charlynne PanderCara [phone]), called the office regarding her father.  She states that he is still in pain from his cystoscopy and urethral dilation on 12/21.  She wants to know if this is normal.    Also, she states that the dilaudid causes her father to have erratic behavior and she is not able to care for him when he takes it.  He is allergic to codiene and cannot take anything in that line of medication.    He has a prescription for tramadol, but it is not helping with the pain.  His follow up appointment with Dr. Apolinar JunesBrandon 07/14/2016 and they want to know if this is appropriate.  She remembers Dr. Apolinar JunesBrandon stating 2 weeks.

## 2016-07-02 ENCOUNTER — Emergency Department
Admission: EM | Admit: 2016-07-02 | Discharge: 2016-07-03 | Disposition: A | Discharge status: Home / Self Care | Payer: BLUE CROSS/BLUE SHIELD | Attending: Emergency Medicine | Admitting: Emergency Medicine

## 2016-07-02 DIAGNOSIS — J449 Chronic obstructive pulmonary disease, unspecified: Secondary | ICD-10-CM | POA: Diagnosis not present

## 2016-07-02 DIAGNOSIS — Z79899 Other long term (current) drug therapy: Secondary | ICD-10-CM | POA: Insufficient documentation

## 2016-07-02 DIAGNOSIS — N3289 Other specified disorders of bladder: Secondary | ICD-10-CM | POA: Diagnosis not present

## 2016-07-02 DIAGNOSIS — R3989 Other symptoms and signs involving the genitourinary system: Secondary | ICD-10-CM | POA: Diagnosis present

## 2016-07-02 DIAGNOSIS — Z87891 Personal history of nicotine dependence: Secondary | ICD-10-CM | POA: Insufficient documentation

## 2016-07-02 DIAGNOSIS — I110 Hypertensive heart disease with heart failure: Secondary | ICD-10-CM | POA: Diagnosis not present

## 2016-07-02 DIAGNOSIS — I509 Heart failure, unspecified: Secondary | ICD-10-CM | POA: Insufficient documentation

## 2016-07-02 LAB — URINALYSIS, COMPLETE (UACMP) WITH MICROSCOPIC
Bilirubin Urine: NEGATIVE
Glucose, UA: NEGATIVE mg/dL
Ketones, ur: NEGATIVE mg/dL
Nitrite: NEGATIVE
Protein, ur: 30 mg/dL — AB
Specific Gravity, Urine: 1.019 (ref 1.005–1.030)
Squamous Epithelial / LPF: NONE SEEN
pH: 5 (ref 5.0–8.0)

## 2016-07-02 LAB — CBC WITH DIFFERENTIAL/PLATELET
Basophils Absolute: 0.1 10*3/uL (ref 0–0.1)
Basophils Relative: 1 %
Eosinophils Absolute: 0.4 10*3/uL (ref 0–0.7)
Eosinophils Relative: 4 %
HCT: 32 % — ABNORMAL LOW (ref 40.0–52.0)
Hemoglobin: 11 g/dL — ABNORMAL LOW (ref 13.0–18.0)
Lymphocytes Relative: 19 %
Lymphs Abs: 1.8 10*3/uL (ref 1.0–3.6)
MCH: 28.1 pg (ref 26.0–34.0)
MCHC: 34.3 g/dL (ref 32.0–36.0)
MCV: 81.9 fL (ref 80.0–100.0)
Monocytes Absolute: 1.2 10*3/uL — ABNORMAL HIGH (ref 0.2–1.0)
Monocytes Relative: 13 %
Neutro Abs: 6 10*3/uL (ref 1.4–6.5)
Neutrophils Relative %: 63 %
Platelets: 251 10*3/uL (ref 150–440)
RBC: 3.91 MIL/uL — ABNORMAL LOW (ref 4.40–5.90)
RDW: 18.8 % — ABNORMAL HIGH (ref 11.5–14.5)
WBC: 9.4 10*3/uL (ref 3.8–10.6)

## 2016-07-02 LAB — BASIC METABOLIC PANEL
Anion gap: 8 (ref 5–15)
BUN: 27 mg/dL — ABNORMAL HIGH (ref 6–20)
CO2: 26 mmol/L (ref 22–32)
Calcium: 9.3 mg/dL (ref 8.9–10.3)
Chloride: 103 mmol/L (ref 101–111)
Creatinine, Ser: 1.3 mg/dL — ABNORMAL HIGH (ref 0.61–1.24)
GFR calc Af Amer: >60 mL/min (ref >60)
GFR calc non Af Amer: 56 mL/min — ABNORMAL LOW (ref >60)
Glucose, Bld: 132 mg/dL — ABNORMAL HIGH (ref 65–99)
Potassium: 4.4 mmol/L (ref 3.5–5.1)
Sodium: 137 mmol/L (ref 135–145)

## 2016-07-02 MED ORDER — OXYCODONE-ACETAMINOPHEN 5-325 MG PO TABS: 1.0000 | ORAL_TABLET | Freq: Four times a day (QID) | ORAL | 0 refills | Status: DC | PRN

## 2016-07-02 MED ORDER — OXYCODONE-ACETAMINOPHEN 5-325 MG PO TABS
1.0000 | ORAL_TABLET | Freq: Once | ORAL | Status: AC
  Administered 2016-07-02: 1 via ORAL
  Filled 2016-07-02: qty 1

## 2016-07-02 MED ORDER — DICYCLOMINE HCL 10 MG PO CAPS
10.0000 mg | ORAL_CAPSULE | Freq: Once | ORAL | Status: AC
  Administered 2016-07-02: 10 mg via ORAL
  Filled 2016-07-02: qty 1

## 2016-07-02 MED ORDER — DICYCLOMINE HCL 10 MG PO CAPS: 10.0000 mg | ORAL_CAPSULE | Freq: Three times a day (TID) | ORAL | 0 refills | Status: DC | PRN

## 2016-07-02 MED ORDER — ONDANSETRON 4 MG PO TBDP
4.0000 mg | ORAL_TABLET | Freq: Once | ORAL | Status: AC
  Administered 2016-07-02: 4 mg via ORAL
  Filled 2016-07-02: qty 1

## 2016-07-02 MED ORDER — ONDANSETRON 4 MG PO TBDP: ORAL_TABLET | ORAL | 0 refills | Status: DC

## 2016-07-02 MED ORDER — MIRABEGRON ER 25 MG PO TB24
25.0000 mg | ORAL_TABLET | Freq: Every day | ORAL | 0 refills | Status: DC
Start: 2016-07-02 — End: 2016-07-15

## 2016-07-02 NOTE — ED Triage Notes (Signed)
Pt had urethral stricture repair by cystoscopy 1 week ago and is having pain from penis to rectum. Pt states pain has been constant since surgery not worse today.  Pt has foley in place that is draining. Was told by  urological to come be evaluated.

## 2016-07-02 NOTE — Telephone Encounter (Signed)
Medication sent to pharmacy per daughter request.

## 2016-07-02 NOTE — ED Provider Notes (Signed)
[facility]-EMERGENCY DEPARTMENT Provider Note   CSN: [id number] Arrival date & time: 07/02/16  1835     History   Chief Complaint Chief Complaint  Patient presents with  . Post-op Problem    HPI Kevin Pearson is a 65 y.o. male hx of reflux, afib, CHF, urethral stricture s/p surgery and foley, here with bladder pain. Patient had ureteral stricture and was dilated a week ago by Dr. Apolinar Junes. Foley was placed. He states that there was some blood in it initially that cleared. He has been having persistent bladder spasms. He denies vomiting or fevers. He was prescribed dilaudid but patient's daughter states that he hallucinates with it so hasn't been giving it to him. He called the office today and was prescribed Myrbetriq, but it required pre authorization so patient hasn't been able to take it.   The history is provided by the patient.    Past Medical History:  Diagnosis Date  . Acid reflux   . Anxiety   . Arthritis   . Atrial fibrillation (HCC)   . CHF (congestive heart failure) (HCC)   . Clotting disorder (HCC)   . COPD (chronic obstructive pulmonary disease) (HCC)   . Depression   . Elevated PSA   . Heart attack   . Heart disease   . Heart failure (HCC)   . Hepatitis   . High cholesterol   . Hypertension   . Sleep apnea   . Urinary retention     Patient Active Problem List   Diagnosis Date Noted  . Urinary retention 06/24/2016    Past Surgical History:  Procedure Laterality Date  . BLADDER SURGERY    . cardiac stents    . CYSTOSCOPY WITH URETHRAL DILATATION N/A 06/25/2016   Procedure: CYSTOSCOPY WITH URETHRAL DILATATION;  Surgeon: Vanna Scotland, MD;  Location: [facility] ORS;  Service: Urology;  Laterality: N/A;  . EP IMPLANTABLE DEVICE     [employer] Jude BiV-ICD  . GREEN LIGHT LASER TURP (TRANSURETHRAL RESECTION OF PROSTATE  2016   done in [facility]        Home Medications    Prior to Admission medications   Medication Sig Start Date End Date Taking? Authorizing Provider    acetaminophen (TYLENOL) 325 MG tablet Take 650 mg by mouth every 6 (six) hours as needed.   Yes Historical Provider, MD  albuterol (PROVENTIL HFA;VENTOLIN HFA) 108 (90 Base) MCG/ACT inhaler Inhale 2 puffs into the lungs every 4 (four) hours as needed for shortness of breath.   Yes Historical Provider, MD  carvedilol (COREG) 6.25 MG tablet Take 6.25 mg by mouth 2 (two) times daily.   Yes Historical Provider, MD  clonazePAM (KLONOPIN) 1 MG tablet Take 1 mg by mouth 3 (three) times daily.   Yes Historical Provider, MD  clopidogrel (PLAVIX) 75 MG tablet Take 75 mg by mouth daily.   Yes Historical Provider, MD  divalproex (DEPAKOTE ER) 500 MG 24 hr tablet Take by mouth 2 (two) times daily.   Yes Historical Provider, MD  furosemide (LASIX) 40 MG tablet Take 40 mg by mouth 2 (two) times daily.   Yes Historical Provider, MD  gabapentin (NEURONTIN) 300 MG capsule Take 300 mg by mouth 3 (three) times daily.   Yes Historical Provider, MD  hydrALAZINE (APRESOLINE) 25 MG tablet Take 25 mg by mouth 3 (three) times daily.   Yes Historical Provider, MD  ipratropium-albuterol (DUONEB) 0.5-2.5 (3) MG/3ML SOLN Take 3 mLs by nebulization every 6 (six) hours as needed (wheezing/shortness of breath).  Yes Historical Provider, MD  losartan (COZAAR) 100 MG tablet Take 100 mg by mouth daily.   Yes Historical Provider, MD  Melatonin 5 MG TABS Take 1 tablet by mouth at bedtime.   Yes Historical Provider, MD  mirabegron ER (MYRBETRIQ) 25 MG TB24 tablet Take 1 tablet (25 mg total) by mouth daily. 07/02/16  Yes Hildred LaserBrian James Budzyn, MD  nitroGLYCERIN (NITROSTAT) 0.4 MG SL tablet Place 0.4 mg under the tongue every 5 (five) minutes as needed for chest pain.   Yes Historical Provider, MD  omeprazole (PRILOSEC) 40 MG capsule Take 40 mg by mouth daily.   Yes Historical Provider, MD  oxyCODONE (ROXICODONE) 5 MG immediate release tablet Take 1 tablet (5 mg total) by mouth every 4 (four) hours as needed. 06/25/16  Yes Vanna ScotlandAshley Brandon, MD   phenazopyridine (PYRIDIUM) 200 MG tablet Take 1 tablet (200 mg total) by mouth 3 (three) times daily as needed for pain. 06/24/16  Yes Arnaldo NatalPaul F Malinda, MD  potassium chloride SA (K-DUR,KLOR-CON) 20 MEQ tablet Take 20 mEq by mouth 2 (two) times daily.   Yes Historical Provider, MD  rivaroxaban (XARELTO) 20 MG TABS tablet Take 20 mg by mouth at bedtime.   Yes Historical Provider, MD  rosuvastatin (CRESTOR) 20 MG tablet Take 20 mg by mouth daily.   Yes Historical Provider, MD  tamsulosin (FLOMAX) 0.4 MG CAPS capsule Take 1 capsule (0.4 mg total) by mouth daily after supper. 06/26/16  Yes Vanna ScotlandAshley Brandon, MD  traMADol (ULTRAM) 50 MG tablet Take 50 mg by mouth every 6 (six) hours as needed for moderate pain or severe pain.    Yes Historical Provider, MD  HYDROmorphone (DILAUDID) 2 MG tablet One to two tablets every six hours for pain 06/25/16   Harle BattiestShannon A McGowan, PA-C    Family History Family History  Problem Relation Age of Onset  . Other Father     Cerebral hemorrhage  . Kidney cancer Neg Hx   . Kidney disease Neg Hx   . Prostate cancer Neg Hx     Social History Social History  Substance Use Topics  . Smoking status: Former Smoker    Types: Cigarettes    Quit date: 08/2015  . Smokeless tobacco: Never Used  . Alcohol use Yes     Allergies   Codeine and Other   Review of Systems Review of Systems  Genitourinary:       Bladder spasms   All other systems reviewed and are negative.    Physical Exam Updated Vital Signs BP 133/83 (BP Location: Left Arm)   Pulse 70   Temp 97.5 F (36.4 C) (Oral)   Resp 18   Ht 5\' 8"  (1.727 m)   Wt 250 lb (113.4 kg)   SpO2 100%   BMI 38.01 kg/m   Physical Exam  Constitutional: He appears well-developed.  Uncomfortable   HENT:  Head: Normocephalic.  Mouth/Throat: Oropharynx is clear and moist.  Eyes: EOM are normal. Pupils are equal, round, and reactive to light.  Neck: Normal range of motion. Neck supple.  Cardiovascular: Normal  rate, regular rhythm and normal heart sounds.   Pulmonary/Chest: Effort normal and breath sounds normal. No respiratory distress. He has no wheezes.  Abdominal: Soft. Bowel sounds are normal.  + bladder tenderness   Genitourinary:  Genitourinary Comments: Foley in place   Musculoskeletal: Normal range of motion.  Neurological: He is alert.  Skin: Skin is warm.  Psychiatric: He has a normal mood and affect.  Nursing note and  vitals reviewed.    ED Treatments / Results  Labs (all labs ordered are listed, but only abnormal results are displayed) Labs Reviewed  CBC WITH DIFFERENTIAL/PLATELET - Abnormal; Notable for the following:       Result Value   RBC 3.91 (*)    Hemoglobin 11.0 (*)    HCT 32.0 (*)    RDW 18.8 (*)    Monocytes Absolute 1.2 (*)    All other components within normal limits  URINALYSIS, COMPLETE (UACMP) WITH MICROSCOPIC - Abnormal; Notable for the following:    Color, Urine YELLOW (*)    APPearance CLEAR (*)    Hgb urine dipstick MODERATE (*)    Protein, ur 30 (*)    Leukocytes, UA TRACE (*)    Bacteria, UA RARE (*)    All other components within normal limits  URINE CULTURE  BASIC METABOLIC PANEL    EKG  EKG Interpretation None       Radiology No results found.  Procedures Procedures (including critical care time)  EMERGENCY DEPARTMENT US RENAL EXAM  "Study: Limited Retroperitoneal Ultrasound of Kidneys"  INDICATIONS: foley placement  Long and short axis of both kidneys were obtained.   PERFORMED BY: Myself  IMAGES ARCHIVED?: No  LIMITATIONS: None  VIEWS USED:   INTERPRETATION: foley in bladder, bladder collapsed    CPT Code: [id number] (limited retroperitoneal)    Medications Ordered in ED Medications  oxyCODONE-acetaminophen (PERCOCET/ROXICET) 5-325 MG per tablet 1 tablet (1 tablet Oral Given 07/02/16 2206)  ondansetron (ZOFRAN-ODT) disintegrating tablet 4 mg (4 mg Oral Given 07/02/16 2205)  dicyclomine (BENTYL) capsule 10 mg  (10 mg Oral Given 07/02/16 2206)     Initial Impression / Assessment and Plan / ED Course  I have reviewed the triage vital signs and the nursing notes.  Pertinent labs & imaging results that were available during my care of the patient were reviewed by me and considered in my medical decision making (see chart for details).  Clinical Course     Camelia EngCarmen Barclift is a 65 y.o. male here with bladder spasms. Foley confirmed to be in bladder. Hasn't been getting much pain meds. No visible blood in foley.   11:23 PM Labs at baseline. UA showed blood but no obvious infection. Given percocet, bentyl and felt better. Will dc home with same.    Final Clinical Impressions(s) / ED Diagnoses   Final diagnoses:  None    New Prescriptions New Prescriptions   No medications on file     Charlynne Panderavid Hsienta Margurette Brener, MD 07/02/16 2324

## 2016-07-02 NOTE — Telephone Encounter (Signed)
Per Dr. Sherryl BartersBudzyn, patient should take 25mg  Myrbetriq to help with bladder spasms.    I offered the Patient's daughter to come by the office and pick up samples.  Patient's daughter works in [place] and requested that the prescription be sent to [facility] [place] and she will pick up after work.

## 2016-07-02 NOTE — Discharge Instructions (Signed)
Please take percocet for severe pain. DO NOT drive with it.   Take zofran with percocet if you are nauseated.   Take bentyl for cramps or spasms.   See your urologist next week  Return to ER if you have severe pain, foley not draining, lots of clots in the foley, severe spasms.

## 2016-07-03 ENCOUNTER — Telehealth: Payer: Self-pay

## 2016-07-03 MED ORDER — OXYCODONE-ACETAMINOPHEN 5-325 MG PO TABS
1.0000 | ORAL_TABLET | Freq: Once | ORAL | Status: AC
  Administered 2016-07-03: 1 via ORAL
  Filled 2016-07-03: qty 1

## 2016-07-03 NOTE — Telephone Encounter (Signed)
PA for myrbetriq DENIED! Spoke with pt in reference to pain, ER visit, and myrbetriq PA being denied. Pt was angry that insurance did not cover medication. Made pt aware as why insurance typically does not cover medication. Also made pt aware samples are available and at front desk due to cost of medication. Pt explained how he is not able to drive and will need to find someone to pick up medication. Once again reinforced with pt samples are available for pick up and medication can not be mailed. Pt voiced understanding.

## 2016-07-04 LAB — URINE CULTURE: Culture: NO GROWTH

## 2016-07-05 ENCOUNTER — Other Ambulatory Visit: Payer: Self-pay | Admitting: Urology

## 2016-07-06 ENCOUNTER — Telehealth: Payer: Self-pay | Admitting: Urology

## 2016-07-06 DIAGNOSIS — N3289 Other specified disorders of bladder: Secondary | ICD-10-CM

## 2016-07-06 NOTE — Telephone Encounter (Signed)
Patient's pharmacy is requesting 90 day supplies of oxybutynin and Levsin. This is not an appropriate amount of these medications as he should not take them long-term. I do not see where these medications have been prescribed for him in the past. We will need further clarification as to why these medications are being requested.

## 2016-07-07 ENCOUNTER — Other Ambulatory Visit: Payer: Self-pay | Admitting: Urology

## 2016-07-07 ENCOUNTER — Telehealth: Payer: Self-pay | Admitting: Urology

## 2016-07-07 DIAGNOSIS — N3289 Other specified disorders of bladder: Secondary | ICD-10-CM

## 2016-07-07 MED ORDER — OXYBUTYNIN CHLORIDE 5 MG PO TABS: 5.0000 mg | ORAL_TABLET | Freq: Two times a day (BID) | ORAL | 0 refills | Status: DC

## 2016-07-07 MED ORDER — HYOSCYAMINE SULFATE SL 0.125 MG SL SUBL: 1.0000 | SUBLINGUAL_TABLET | Freq: Every day | SUBLINGUAL | 0 refills | Status: DC

## 2016-07-07 NOTE — Telephone Encounter (Signed)
Patient's daughter is calling saying that her father went back to the ER on 07-02-16 for bladder spasms and pain. They sent him home with percocet for 3 days worth. He says that he has pain from the tip of his penis to his anus the pain rates a 7-8. She wants to know why he is still in pain and if this is normal? whta can he do and can he have more pain meds? She was asking why is could not be called in and I explained the reason why to her. And she understood. Please advise on what you would like to do.  Thanks michelle

## 2016-07-07 NOTE — Telephone Encounter (Signed)
Medication sent to pharmacy for 30 days.

## 2016-07-07 NOTE — Telephone Encounter (Signed)
Pt daughter called over the weekend c/o pt continuing to be in a lot of pain post surgery. Dr. Ronne BinningMcKenzie was on call and advised pt to take both medications.

## 2016-07-07 NOTE — Telephone Encounter (Signed)
That is fine, but I cannot prescribe a 90 day supply as these meds should not be taken long term.  We can call in 30 days of the medications.

## 2016-07-08 NOTE — Telephone Encounter (Signed)
This is mostly consistent with bladder spasms.  I would recommend avoiding narcotics, try tylenol/ instead.  Continue anticholinergics.     I had a lengthy discussion today witih his daughter about my concern for continued narcotics use and history of substance abuse.  No further narcotics will be prescribed.  He has been doing better over the past two days on lower dose tramadol.    Recommend obtaining records prior to next visit from previous urologist.  Plan will likely be either CIC daily vs. More definitive bladder neck procedure once records reviewed.    Daughter will be attending visit next week, concerns for memory issues.   Vanna ScotlandAshley Mando Blatz, MD

## 2016-07-09 ENCOUNTER — Ambulatory Visit: Payer: BLUE CROSS/BLUE SHIELD | Admitting: Urology

## 2016-07-14 ENCOUNTER — Ambulatory Visit: Payer: BLUE CROSS/BLUE SHIELD | Admitting: Urology

## 2016-07-15 ENCOUNTER — Encounter: Payer: Self-pay | Admitting: Urology

## 2016-07-15 ENCOUNTER — Ambulatory Visit (INDEPENDENT_AMBULATORY_CARE_PROVIDER_SITE_OTHER): Payer: BLUE CROSS/BLUE SHIELD | Admitting: Urology

## 2016-07-15 VITALS — BP 98/62 | HR 75 | Ht 68.0 in | Wt 257.0 lb

## 2016-07-15 DIAGNOSIS — N320 Bladder-neck obstruction: Secondary | ICD-10-CM | POA: Diagnosis not present

## 2016-07-15 DIAGNOSIS — R3989 Other symptoms and signs involving the genitourinary system: Secondary | ICD-10-CM

## 2016-07-15 DIAGNOSIS — R339 Retention of urine, unspecified: Secondary | ICD-10-CM

## 2016-07-15 NOTE — Progress Notes (Signed)
Catheter Removal  Patient is present today for a catheter removal.  8ml of water was drained from the balloon. A 20FR foley cath was removed from the bladder no complications were noted . Patient tolerated well.  Preformed by: Eligha BridegroomSarah Claud Gowan, CMA  Continuous Intermittent Catheterization  Due to urethral stricture and urinary retention patient is present today for a teaching of self I & O Catheterization. Patient was given detailed verbal and printed instructions of self catheterization. Patient was cleaned and prepped in a sterile fashion.  With instruction and assistance patient's daughter inserted a 16FR flex coude (patient needs a 16 FR flex coude to keep urethra open and in hopes that stricture does not reform, should not use a smaller cath or straight cath per Dr. Apolinar JunesBrandon with patient's history)  and urine return was noted 5 ml, urine was yellow in color. Patient tolerated well, no complications were noted Patient was given a sample bag with supplies to take home.  It was explained to the patient and daughter to not push past resistance and to call the office with any problems cathing due to patient's stricture history. Some blood may be present with cath due to patient's blood thinners but any excessive blood should call the office for further evaluation.  Instructions were given per Dr, Apolinar JunesBrandon for patient to cath one time daily to keep stricture from reforming.  An order was placed with [facility] for catheters to be sent to the patient's home. Patient is to follow up in 3 months.  Preformed by: Eligha BridegroomSarah Roxie Gueye, CMA

## 2016-07-16 DIAGNOSIS — F419 Anxiety disorder, unspecified: Secondary | ICD-10-CM | POA: Insufficient documentation

## 2016-07-16 DIAGNOSIS — G629 Polyneuropathy, unspecified: Secondary | ICD-10-CM | POA: Insufficient documentation

## 2016-07-16 DIAGNOSIS — F32A Depression, unspecified: Secondary | ICD-10-CM | POA: Insufficient documentation

## 2016-07-16 DIAGNOSIS — J449 Chronic obstructive pulmonary disease, unspecified: Secondary | ICD-10-CM | POA: Insufficient documentation

## 2016-07-16 DIAGNOSIS — M25571 Pain in right ankle and joints of right foot: Secondary | ICD-10-CM | POA: Insufficient documentation

## 2016-07-16 DIAGNOSIS — N35919 Unspecified urethral stricture, male, unspecified site: Secondary | ICD-10-CM | POA: Insufficient documentation

## 2016-07-16 DIAGNOSIS — I5022 Chronic systolic (congestive) heart failure: Secondary | ICD-10-CM | POA: Insufficient documentation

## 2016-07-16 DIAGNOSIS — F329 Major depressive disorder, single episode, unspecified: Secondary | ICD-10-CM | POA: Insufficient documentation

## 2016-07-16 NOTE — Progress Notes (Signed)
07/15/2016 12:16 PM   Kevin Pearson May 18, 1951 [id number]  Referring provider: No referring provider defined for this encounter.  Chief Complaint  Patient presents with  . Routine Post Op    HPI: 66 yo M with ischemic cardiomyopathy (EF 35%), CAD s/p PCI/ stent/ ICD, MI x multiple, afib on anticoagulation/ antiplatelet therapy with severe bladder neck contracture s/p emergent urethral dilation under MAC  in the setting of urinary retention on 06/26/15.  He returns today for further discussion on how to manage his bladder/bladder neck contracture.  He's been having fairly significant pain from his Foley catheter. He is taken a number of narcotics but is currently on tramadol which is helpful.  He is also using Mybetriq and oxybutynin as needed for control bladder spasms. His primary pain is from the tip of his penis radiating to his rectum.  His catheter has been draining well, clear yellow urine. No issues with hematuria.  Review of medical records indicate that he had an in office laser vaporization of the prostate on 06/26/2015 by Dr. Fanny Skates.  It is unclear from the note what sort of laser was used, settings, etc.  He then developed a significant bladder neck contracture with plans for surgical management of this but unable to proceed secondary to local comorbidities and cardiac status.  [name] recently moved to [place] due to his declining health status to live near his daughter who is a Marine scientist at [employer].  He was seen and evaluated by Dr. Saralyn Pilar shows on 07/03/2016 for his initial visit.  He is scheduled to return in 3 months.   PMH: Past Medical History:  Diagnosis Date  . Acid reflux   . Anxiety   . Arthritis   . Atrial fibrillation ([place])   . CHF (congestive heart failure) ([place])   . Clotting disorder ([place])   . COPD (chronic obstructive pulmonary disease) ([place])   . Depression   . Elevated PSA   . Heart attack   . Heart disease   . Heart failure ([place])    . Hepatitis   . High cholesterol   . Hypertension   . Sleep apnea   . Urinary retention     Surgical History: Past Surgical History:  Procedure Laterality Date  . BLADDER SURGERY    . cardiac stents    . CYSTOSCOPY WITH URETHRAL DILATATION N/A 06/25/2016   Procedure: CYSTOSCOPY WITH URETHRAL DILATATION;  Surgeon: Hollice Espy, MD;  Location: [facility] ORS;  Service: Urology;  Laterality: N/A;  . EP IMPLANTABLE DEVICE     St. Jude BiV-ICD  . GREEN LIGHT LASER TURP (TRANSURETHRAL RESECTION OF PROSTATE  2016   done in [facility]     Home Medications:  Allergies as of 07/15/2016      Reactions   Codeine Nausea And Vomiting   Other Itching   Mango skin      Medication List       Accurate as of 07/15/16 11:59 PM. Always use your most recent med list.          acetaminophen 325 MG tablet Commonly known as:  TYLENOL Take 650 mg by mouth every 6 (six) hours as needed.   albuterol 108 (90 Base) MCG/ACT inhaler Commonly known as:  PROVENTIL HFA;VENTOLIN HFA Inhale 2 puffs into the lungs every 4 (four) hours as needed for shortness of breath.   carvedilol 6.25 MG tablet Commonly known as:  COREG Take 6.25 mg by mouth 2 (two) times daily.   clonazePAM 1 MG  tablet Commonly known as:  KLONOPIN Take 1 mg by mouth 3 (three) times daily.   clopidogrel 75 MG tablet Commonly known as:  PLAVIX Take 75 mg by mouth daily.   dicyclomine 10 MG capsule Commonly known as:  BENTYL Take 1 capsule (10 mg total) by mouth 3 (three) times daily as needed for spasms.   divalproex 500 MG 24 hr tablet Commonly known as:  DEPAKOTE ER Take by mouth 2 (two) times daily.   furosemide 40 MG tablet Commonly known as:  LASIX Take 40 mg by mouth 2 (two) times daily.   gabapentin 300 MG capsule Commonly known as:  NEURONTIN Take 300 mg by mouth 3 (three) times daily.   hydrALAZINE 25 MG tablet Commonly known as:  APRESOLINE Take 25 mg by mouth 3 (three) times daily.   Hyoscyamine Sulfate SL  0.125 MG Subl Commonly known as:  LEVSIN/SL Place 1 tablet under the tongue daily.   ipratropium-albuterol 0.5-2.5 (3) MG/3ML Soln Commonly known as:  DUONEB Take 3 mLs by nebulization every 6 (six) hours as needed (wheezing/shortness of breath).   losartan 100 MG tablet Commonly known as:  COZAAR Take 100 mg by mouth daily.   Melatonin 5 MG Tabs Take 1 tablet by mouth at bedtime.   nitroGLYCERIN 0.4 MG SL tablet Commonly known as:  NITROSTAT Place 0.4 mg under the tongue every 5 (five) minutes as needed for chest pain.   omeprazole 40 MG capsule Commonly known as:  PRILOSEC Take 40 mg by mouth daily.   ondansetron 4 MG disintegrating tablet Commonly known as:  ZOFRAN ODT 25m ODT q6 hours prn nausea/vomit   oxybutynin 5 MG tablet Commonly known as:  DITROPAN Take 1 tablet (5 mg total) by mouth 2 (two) times daily.   potassium chloride SA 20 MEQ tablet Commonly known as:  K-DUR,KLOR-CON Take 20 mEq by mouth 2 (two) times daily.   rosuvastatin 20 MG tablet Commonly known as:  CRESTOR Take 20 mg by mouth daily.   tamsulosin 0.4 MG Caps capsule Commonly known as:  FLOMAX Take 1 capsule (0.4 mg total) by mouth daily after supper.   traMADol 50 MG tablet Commonly known as:  ULTRAM Take 50 mg by mouth every 6 (six) hours as needed for moderate pain or severe pain.   XARELTO 20 MG Tabs tablet Generic drug:  rivaroxaban Take 20 mg by mouth at bedtime.       Allergies:  Allergies  Allergen Reactions  . Codeine Nausea And Vomiting  . Other Itching    Mango skin    Family History: Family History  Problem Relation Age of Onset  . Other Father     Cerebral hemorrhage  . Kidney cancer Neg Hx   . Kidney disease Neg Hx   . Prostate cancer Neg Hx     Social History:  reports that he quit smoking about 11 months ago. His smoking use included Cigarettes. He has never used smokeless tobacco. He reports that he drinks alcohol. He reports that he does not use  drugs.  ROS: UROLOGY Frequent Urination?: No Hard to postpone urination?: No Burning/pain with urination?: No Get up at night to urinate?: No Leakage of urine?: No Urine stream starts and stops?: No Trouble starting stream?: No Do you have to strain to urinate?: No Blood in urine?: No Urinary tract infection?: No Sexually transmitted disease?: No Injury to kidneys or bladder?: No Painful intercourse?: No Weak stream?: No Erection problems?: No Penile pain?: Yes  Gastrointestinal Nausea?:  No Vomiting?: No Indigestion/heartburn?: No Diarrhea?: No Constipation?: No  Constitutional Fever: No Night sweats?: No Weight loss?: No Fatigue?: No  Skin Skin rash/lesions?: No Itching?: No  Eyes Blurred vision?: No Double vision?: No  Ears/Nose/Throat Sore throat?: No Sinus problems?: No  Hematologic/Lymphatic Swollen glands?: No Easy bruising?: No  Cardiovascular Leg swelling?: Yes Chest pain?: No  Respiratory Cough?: No Shortness of breath?: Yes  Endocrine Excessive thirst?: No  Musculoskeletal Back pain?: No Joint pain?: No  Neurological Headaches?: No Dizziness?: No  Psychologic Depression?: No Anxiety?: Yes  Physical Exam: BP 98/62   Pulse 75   Ht _0  (1.727 m)   Wt 257 lb (116.6 kg)   BMI 39.08 kg/m   Constitutional:  Alert and oriented, No acute distress.  Accompanied by daughter today.  HEENT: [place] AT, moist mucus membranes.  Trachea midline, no masses. Cardiovascular: Bilateral lower extremity edema, chronic with venous stasis changes Respiratory: Normal respiratory effort, no increased work of breathing. GI: Abdomen is soft, nontender, nondistended, no abdominal masses, obese..   GU: 52 French [place] tip Foley catheter in place draining clear yellow urine. Neurologic: Grossly intact, no focal deficits, moving all 4 extremities. Psychiatric: Normal mood and affect.  Laboratory Data: Lab Results  Component Value Date   WBC 9.4  07/02/2016   HGB 11.0 (L) 07/02/2016   HCT 32.0 (L) 07/02/2016   MCV 81.9 07/02/2016   PLT 251 07/02/2016    Lab Results  Component Value Date   CREATININE 1.30 (H) 07/02/2016    Urinalysis    Component Value Date/Time   COLORURINE YELLOW (A) 07/02/2016 2127   APPEARANCEUR CLEAR (A) 07/02/2016 2127   LABSPEC 1.019 07/02/2016 2127   PHURINE 5.0 07/02/2016 2127   GLUCOSEU NEGATIVE 07/02/2016 2127   HGBUR MODERATE (A) 07/02/2016 2127   BILIRUBINUR NEGATIVE 07/02/2016 2127   [place] NEGATIVE 07/02/2016 2127   PROTEINUR 30 (A) 07/02/2016 2127   NITRITE NEGATIVE 07/02/2016 2127   LEUKOCYTESUR TRACE (A) 07/02/2016 2127    Pertinent Imaging: n/a  Assessment & Plan:   1. Bladder neck contracture Severe pinpoint bladder neck contracture likely secondary to history of laser ablation of the prostate in 06/2015 Status post dilation under monitored anesthesia care in 06/2016 in the setting of retention and severe urinary symptoms Discussed today that given the severity of the contracture, I have a high suspicion that it will recur and will require more definitive treatment. Options for treatment include incision of bladder neck, laser ablation of bladder neck contracture, chronic indwelling Foley catheter versus suprapubic tube, and daily catheterization to keep his bladder neck open which is likely temporizing measure. Given his overall medical comorbidities and cardiac status, he is currently not a good candidate for general anesthesia. At this time, the patient and his daughter are willing to have his catheter removed and catheterized daily to try to keep his bladder neck open with the understanding that eventually his bladder neck contracture will likely recur CIC teaching was performed today, performed without difficulty by his nurse daughter who is willing to help, self cath daily indefinitely with 16 Fr  He will likely require definitive management and future. Would like to see  him after he follows up with his cardiologist again in 3 months to reassess.  Mr. Hinzman his daughter were advised to call and return sooner if date to have difficulty catheterizing with a 74 French Foley.  2. Urinary retention Secondary to bladder neck contracture  3. Urethral pain Mr. Nelma Rothman has a history of  narcotics abuse and chronic pain. I'm hesitant to prescribe him any further narcotics given this history is we will be removing his catheter today. I made this quite clear to him and his daughter.  Supportive care as needed.   Return in about 3 months (around 10/13/2016) for recheck .  Hollice Espy, MD  [facility] [address] [place], [place] [address] [phone]  I spent 25 min with this patient of which greater than 50% was spent in counseling and coordination of care with the patient.  Records from outside urologist as well as cardiologist office were reviewed today detail.

## 2016-07-20 ENCOUNTER — Other Ambulatory Visit: Payer: Self-pay

## 2016-07-24 ENCOUNTER — Other Ambulatory Visit: Payer: Self-pay | Admitting: Urology

## 2016-07-24 DIAGNOSIS — N359 Urethral stricture, unspecified: Secondary | ICD-10-CM

## 2016-07-27 ENCOUNTER — Encounter: Payer: Self-pay | Admitting: Emergency Medicine

## 2016-07-27 ENCOUNTER — Emergency Department: Payer: BLUE CROSS/BLUE SHIELD

## 2016-07-27 ENCOUNTER — Emergency Department
Admission: EM | Admit: 2016-07-27 | Discharge: 2016-07-27 | Disposition: A | Discharge status: Home / Self Care | Payer: BLUE CROSS/BLUE SHIELD | Attending: Emergency Medicine | Admitting: Emergency Medicine

## 2016-07-27 DIAGNOSIS — R531 Weakness: Secondary | ICD-10-CM | POA: Diagnosis present

## 2016-07-27 DIAGNOSIS — I509 Heart failure, unspecified: Secondary | ICD-10-CM | POA: Insufficient documentation

## 2016-07-27 DIAGNOSIS — I110 Hypertensive heart disease with heart failure: Secondary | ICD-10-CM | POA: Diagnosis not present

## 2016-07-27 DIAGNOSIS — J449 Chronic obstructive pulmonary disease, unspecified: Secondary | ICD-10-CM | POA: Diagnosis not present

## 2016-07-27 DIAGNOSIS — R079 Chest pain, unspecified: Secondary | ICD-10-CM

## 2016-07-27 DIAGNOSIS — R0789 Other chest pain: Secondary | ICD-10-CM | POA: Insufficient documentation

## 2016-07-27 DIAGNOSIS — Z87891 Personal history of nicotine dependence: Secondary | ICD-10-CM | POA: Insufficient documentation

## 2016-07-27 DIAGNOSIS — Z79899 Other long term (current) drug therapy: Secondary | ICD-10-CM | POA: Insufficient documentation

## 2016-07-27 HISTORY — DX: Orthostatic hypotension: I95.1

## 2016-07-27 LAB — HEPATIC FUNCTION PANEL
ALT: 8 U/L — ABNORMAL LOW (ref 17–63)
AST: 16 U/L (ref 15–41)
Albumin: 2.9 g/dL — ABNORMAL LOW (ref 3.5–5.0)
Alkaline Phosphatase: 35 U/L — ABNORMAL LOW (ref 38–126)
Bilirubin, Direct: <0.1 mg/dL — ABNORMAL LOW (ref 0.1–0.5)
Total Bilirubin: 0.4 mg/dL (ref 0.3–1.2)
Total Protein: 6.1 g/dL — ABNORMAL LOW (ref 6.5–8.1)

## 2016-07-27 LAB — BASIC METABOLIC PANEL
Anion gap: 7 (ref 5–15)
BUN: 23 mg/dL — ABNORMAL HIGH (ref 6–20)
CO2: 21 mmol/L — ABNORMAL LOW (ref 22–32)
Calcium: 7.9 mg/dL — ABNORMAL LOW (ref 8.9–10.3)
Chloride: 111 mmol/L (ref 101–111)
Creatinine, Ser: 1.25 mg/dL — ABNORMAL HIGH (ref 0.61–1.24)
GFR calc Af Amer: >60 mL/min (ref >60)
GFR calc non Af Amer: 59 mL/min — ABNORMAL LOW (ref >60)
Glucose, Bld: 107 mg/dL — ABNORMAL HIGH (ref 65–99)
Potassium: 3.6 mmol/L (ref 3.5–5.1)
Sodium: 139 mmol/L (ref 135–145)

## 2016-07-27 LAB — CBC
HCT: 31.7 % — ABNORMAL LOW (ref 40.0–52.0)
Hemoglobin: 10.8 g/dL — ABNORMAL LOW (ref 13.0–18.0)
MCH: 28.2 pg (ref 26.0–34.0)
MCHC: 34.2 g/dL (ref 32.0–36.0)
MCV: 82.4 fL (ref 80.0–100.0)
Platelets: 224 10*3/uL (ref 150–440)
RBC: 3.84 MIL/uL — ABNORMAL LOW (ref 4.40–5.90)
RDW: 17.8 % — ABNORMAL HIGH (ref 11.5–14.5)
WBC: 9.3 10*3/uL (ref 3.8–10.6)

## 2016-07-27 LAB — TROPONIN I
Troponin I: <0.03 ng/mL (ref <0.03)
Troponin I: <0.03 ng/mL (ref <0.03)

## 2016-07-27 LAB — BRAIN NATRIURETIC PEPTIDE: B Natriuretic Peptide: 162 pg/mL — ABNORMAL HIGH (ref 0.0–100.0)

## 2016-07-27 NOTE — ED Notes (Addendum)
Repeat trop sent to lab

## 2016-07-27 NOTE — ED Provider Notes (Signed)
Time Seen: Approximately 1507  I have reviewed the triage notes  Chief Complaint: Weakness   History of Present Illness: Kevin Pearson is a 66 y.o. male *who has a history of multiple medical problems. Patient went to her primary physician's office earlier today and was noted to have a blood pressure 64/40. He denies any syncopal episodes but states he's had some generalized weakness and dizziness over the last 24 hours. Any fever or chills or productive cough. He states he's had been around numerous family members said it felt ill. He states some mild chest discomfort and does have an extensive cardiovascular history. He states he had a heart catheterization approximately 3 months ago and he is not aware of any new stents etc. He has not upcoming appointment with local cardiology for evaluation of his pacemaker etc. Discomfort seems different than his previous cardiovascular pain which usually includes diaphoresis shoulder and neck pain. He denies any new shortness of breath. He does have some swelling of both lower extremities which she states is not significantly unusual for him.   Past Medical History:  Diagnosis Date  . Acid reflux   . Anxiety   . Arthritis   . Atrial fibrillation ([facility])   . CHF (congestive heart failure) ([facility])   . Clotting disorder ([facility])   . COPD (chronic obstructive pulmonary disease) ([facility])   . Depression   . Elevated PSA   . Heart attack   . Heart disease   . Heart failure ([facility])   . Hepatitis   . High cholesterol   . Hypertension   . Sleep apnea   . Urinary retention     Patient Active Problem List   Diagnosis Date Noted  . Urinary retention 06/24/2016    Past Surgical History:  Procedure Laterality Date  . BLADDER SURGERY    . cardiac stents    . CYSTOSCOPY WITH URETHRAL DILATATION N/A 06/25/2016   Procedure: CYSTOSCOPY WITH URETHRAL DILATATION;  Surgeon: Vanna Scotland, MD;  Location: [facility] ORS;  Service: Urology;  Laterality: N/A;  . EP  IMPLANTABLE DEVICE     St. Jude BiV-ICD  . GREEN LIGHT LASER TURP (TRANSURETHRAL RESECTION OF PROSTATE  2016   done in [facility]     Past Surgical History:  Procedure Laterality Date  . BLADDER SURGERY    . cardiac stents    . CYSTOSCOPY WITH URETHRAL DILATATION N/A 06/25/2016   Procedure: CYSTOSCOPY WITH URETHRAL DILATATION;  Surgeon: Vanna Scotland, MD;  Location: [facility] ORS;  Service: Urology;  Laterality: N/A;  . EP IMPLANTABLE DEVICE     St. Jude BiV-ICD  . GREEN LIGHT LASER TURP (TRANSURETHRAL RESECTION OF PROSTATE  2016   done in [facility]     Current Outpatient Rx  . Order #: [id number] Class: Historical Med  . Order #: [id number] Class: Historical Med  . Order #: [id number] Class: Historical Med  . Order #: [id number] Class: Historical Med  . Order #: [id number] Class: Historical Med  . Order #: [id number] Class: Print  . Order #: [id number] Class: Historical Med  . Order #: [id number] Class: Historical Med  . Order #: [id number] Class: Historical Med  . Order #: [id number] Class: Historical Med  . Order #: [id number] Class: Fax  . Order #: [id number] Class: Historical Med  . Order #: [id number] Class: Historical Med  . Order #: [id number] Class: Historical Med  . Order #: [id number] Class: Historical Med  . Order #: [id number] Class: Historical Med  . Order #: [id number] Class: Print  . Order #: [id number] Class: Fax  . Order #: [id number] Class: Historical Med  .  Order #: [id number] Class: Historical Med  . Order #: [id number] Class: Historical Med  . Order #: [id number] Class: Fax  . Order #: [id number] Class: Historical Med    Allergies:  Codeine and Other  Family History: Family History  Problem Relation Age of Onset  . Other Father     Cerebral hemorrhage  . Kidney cancer Neg Hx   . Kidney disease Neg Hx   . Prostate cancer Neg Hx     Social History: Social History  Substance Use Topics  . Smoking status: Former Smoker    Types: Cigarettes    Quit date: 08/2015  . Smokeless tobacco: Never Used  .  Alcohol use Yes     Review of Systems:   10 point review of systems was performed and was otherwise negative:  Constitutional: No fever Eyes: No visual disturbances ENT: No sore throat, ear pain Cardiac: Mild chest discomfort without any pleuritic or positional component and no obvious radiation to the arm or jaw area Respiratory: No shortness of breath, wheezing, or stridor Abdomen: No abdominal pain, no vomiting, No diarrhea Endocrine: No weight loss, No night sweats Extremities: Peripheral edema in both lower extremities Skin: No rashes, easy bruising Neurologic: No focal weakness, trouble with speech or swollowing Urologic: No dysuria, Hematuria, or urinary frequency   Physical Exam:  ED Triage Vitals  Enc Vitals Group     BP 07/27/16 1451 (!) 127/54     Pulse Rate 07/27/16 1451 69     Resp 07/27/16 1451 18     Temp 07/27/16 1451 97.9 F (36.6 C)     Temp Source 07/27/16 1451 Oral     SpO2 07/27/16 1451 97 %     Weight 07/27/16 1453 257 lb (116.6 kg)     Height 07/27/16 1453 5\' 8"  (1.727 m)     Head Circumference --      Peak Flow --      Pain Score 07/27/16 1453 2     Pain Loc --      Pain Edu? --      Excl. in GC? --     General: Awake , Alert , and Oriented times 3; GCS 15 No signs of respiratory distress Head: Normal cephalic , atraumatic Eyes: Pupils equal , round, reactive to light Nose/Throat: No nasal drainage, patent upper airway without erythema or exudate.  Neck: Supple, Full range of motion, No anterior adenopathy or palpable thyroid masses Lungs: Clear to ascultation without wheezes , rhonchi, or rales Heart: Regular rate, regular rhythm without murmurs , gallops , or rubs Abdomen: Soft, non tender without rebound, guarding , or rigidity; bowel sounds positive and symmetric in all 4 quadrants. No organomegaly .        Extremities: Mild circumferential edema in both lower extremities which is nonpitting Neurologic:  Motor symmetric without deficits,  sensory intact Skin: warm, dry, no rashes   Labs:   All laboratory work was reviewed including any pertinent negatives or positives listed below:  Labs Reviewed  BASIC METABOLIC PANEL  CBC  URINALYSIS, COMPLETE (UACMP) WITH MICROSCOPIC  HEPATIC FUNCTION PANEL  TROPONIN I  CBG MONITORING, ED  Initial and repeat troponin within normal limits. Mild renal insufficiency and indications of low protein.  EKG:  ED ECG REPORT I, Jennye MoccasinBrian S Quigley, the attending physician, personally viewed and interpreted this ECG.  Date: 07/27/2016 EKG Time: 1646 Rate: 70Rhythm: Ventricular paced rhythm with no obvious ischemic changes QRS Axis: normal Intervals: normal ST/T Wave abnormalities: normal Conduction Disturbances: none Narrative  Interpretation: unremarkable    Radiology:  "Dg Chest 2 View  Result Date: 07/27/2016 CLINICAL DATA:  Left-sided chest pain today. Weakness. History of congestive heart failure and myocardial infarctions EXAM: CHEST  2 VIEW COMPARISON:  06/18/2016. FINDINGS: The heart size and mediastinal contours are stable. Left subclavian pacemaker leads are unchanged within the right ventricle and coronary sinus. A coronary stent is noted. There is mild chronic central airway and pleural thickening, but no edema, confluent airspace opacity or significant pleural effusion. The bones appear unchanged. IMPRESSION: Radiographically stable appearance of the chest. No acute cardiopulmonary process. Electronically Signed   By: Carey Bullocks M.D.   On: 07/27/2016 16:53  "  I personally reviewed the radiologic studies   ED Course:  Patient's stay here was uneventful and the patient remained hemodynamically stable specifically the blood pressure is within normal limits. Patient's had some chest discomfort which was atypical from his normal ischemic chest pain. Initial and 3 hour delay troponin were negative. Patient has a pacemaker is been worked up for her near syncope and has had  episodes of orthostatic hypotension in the past. Results the laboratory work and EKG were provided to the daughter is the patient's primary caretaker and she was advised continue with outpatient follow-up specifically with the cardiologist because of the chest discomfort and orthostatic hypotension. All questions and concerns were addressed at the bedside     Assessment: * Near syncope Transient hypotension     Plan:  Outpatient Patient was advised to return immediately if condition worsens. Patient was advised to follow up with their primary care physician or other specialized physicians involved in their outpatient care. The patient and/or family member/power of attorney had laboratory results reviewed at the bedside. All questions and concerns were addressed and appropriate discharge instructions were distributed by the nursing staff.             Jennye Moccasin, MD 07/27/16 [phone]

## 2016-07-27 NOTE — ED Triage Notes (Signed)
Pt presents from pcp office in [place] after they found bp reading of 64/40. Pt in office for generlized weakness and dizziness. He reports that other family members have had flu-like symptoms in last week or two. Pt alert & oriented with NAD noted. BP on arrival 127/54

## 2016-07-27 NOTE — ED Notes (Signed)
Transported to CT 

## 2016-07-27 NOTE — Discharge Instructions (Signed)
Please return immediately if condition worsens. Please contact her primary physician or the physician you were given for referral. If you have any specialist physicians involved in her treatment and plan please also contact them. Thank you for using [facility] emergency [facility]. ° °

## 2016-07-27 NOTE — ED Notes (Signed)
Pt discharged to home.  Family member driving.  Discharge instructions reviewed.  Verbalized understanding.  No questions or concerns at this time.  Teach back verified.  Pt in NAD.  No items left in ED.   

## 2016-07-29 DIAGNOSIS — Z8679 Personal history of other diseases of the circulatory system: Secondary | ICD-10-CM | POA: Insufficient documentation

## 2016-08-03 ENCOUNTER — Encounter: Payer: BLUE CROSS/BLUE SHIELD | Attending: Cardiology | Admitting: *Deleted

## 2016-08-03 VITALS — Ht 67.9 in | Wt 264.2 lb

## 2016-08-03 DIAGNOSIS — I5022 Chronic systolic (congestive) heart failure: Secondary | ICD-10-CM | POA: Insufficient documentation

## 2016-08-03 NOTE — Patient Instructions (Signed)
Patient Instructions  Patient Details  Name: Kevin Pearson MRN: [id number] Date of Birth: 1951-03-29 Referring Provider:  Marcina Millard, MD  Below are the personal goals you chose as well as exercise and nutrition goals. Our goal is to help you keep on track towards obtaining and maintaining your goals. We will be discussing your progress on these goals with you throughout the program.  Initial Exercise Prescription:     Initial Exercise Prescription - 08/03/16 1500      Date of Initial Exercise RX and Referring Provider   Date 08/03/16   Referring Provider Cephas Darby MD     Oxygen   Oxygen Continuous   Liters 2     Treadmill   MPH 1.3   Grade 0   Minutes 15   METs 2     NuStep   Level 1   Minutes 15   METs 2     Biostep-RELP   Level 2   Minutes 15   METs 2     Prescription Details   Frequency (times per week) 2   Duration Progress to 45 minutes of aerobic exercise without signs/symptoms of physical distress     Intensity   THRR 40-80% of Max Heartrate 104-139   Ratings of Perceived Exertion 11-15   Perceived Dyspnea 0-4     Progression   Progression Continue to progress workloads to maintain intensity without signs/symptoms of physical distress.     Resistance Training   Training Prescription Yes   Weight 3 lbs   Reps 10-15      Exercise Goals: Frequency: Be able to perform aerobic exercise three times per week working toward 3-5 days per week.  Intensity: Work with a perceived exertion of 11 (fairly light) - 15 (hard) as tolerated. Follow your new exercise prescription and watch for changes in prescription as you progress with the program. Changes will be reviewed with you when they are made.  Duration: You should be able to do 30 minutes of continuous aerobic exercise in addition to a 5 minute warm-up and a 5 minute cool-down routine.  Nutrition Goals: Your personal nutrition goals will be established when you do your nutrition  analysis with the dietician.  The following are nutrition guidelines to follow: Cholesterol < 200mg /day Sodium < 1500mg /day Fiber: Men over 50 yrs - 30 grams per day  Personal Goals:     Personal Goals and Risk Factors at Admission - 08/03/16 1311      Core Components/Risk Factors/Patient Goals on Admission    Weight Management Yes;Obesity;Weight Loss   Admit Weight 264 lb 3.2 oz (119.8 kg)   Goal Weight: Short Term 260 lb (117.9 kg)   Goal Weight: Long Term 180 lb (81.6 kg)   Expected Outcomes Short Term: Continue to assess and modify interventions until short term weight is achieved;Long Term: Adherence to nutrition and physical activity/exercise program aimed toward attainment of established weight goal;Weight Loss: Understanding of general recommendations for a balanced deficit meal plan, which promotes 1-2 lb weight loss per week and includes a negative energy balance of [phone] kcal/d;Understanding recommendations for meals to include 15-35% energy as protein, 25-35% energy from fat, 35-60% energy from carbohydrates, less than 200mg  of dietary cholesterol, 20-35 gm of total fiber daily;Understanding of distribution of calorie intake throughout the day with the consumption of 4-5 meals/snacks   Sedentary Yes   Intervention Provide advice, education, support and counseling about physical activity/exercise needs.;Develop an individualized exercise prescription for aerobic and resistive training based on initial  evaluation findings, risk stratification, comorbidities and participant's personal goals.   Expected Outcomes Achievement of increased cardiorespiratory fitness and enhanced flexibility, muscular endurance and strength shown through measurements of functional capacity and personal statement of participant.   Increase Strength and Stamina Yes   Intervention Provide advice, education, support and counseling about physical activity/exercise needs.;Develop an individualized exercise  prescription for aerobic and resistive training based on initial evaluation findings, risk stratification, comorbidities and participant's personal goals.   Expected Outcomes Achievement of increased cardiorespiratory fitness and enhanced flexibility, muscular endurance and strength shown through measurements of functional capacity and personal statement of participant.   Improve shortness of breath with ADL's Yes  Has COPD, wears oxygen as prescribed   Intervention Provide education, individualized exercise plan and daily activity instruction to help decrease symptoms of SOB with activities of daily living.   Expected Outcomes Short Term: Achieves a reduction of symptoms when performing activities of daily living.   Heart Failure Yes   Intervention Provide a combined exercise and nutrition program that is supplemented with education, support and counseling about heart failure. Directed toward relieving symptoms such as shortness of breath, decreased exercise tolerance, and extremity edema.   Expected Outcomes Improve functional capacity of life;Short term: Attendance in program 2-3 days a week with increased exercise capacity. Reported lower sodium intake. Reported increased fruit and vegetable intake. Reports medication compliance.;Short term: Daily weights obtained and reported for increase. Utilizing diuretic protocols set by physician.;Long term: Adoption of self-care skills and reduction of barriers for early signs and symptoms recognition and intervention leading to self-care maintenance.   Hypertension Yes   Intervention Provide education on lifestyle modifcations including regular physical activity/exercise, weight management, moderate sodium restriction and increased consumption of fresh fruit, vegetables, and low fat dairy, alcohol moderation, and smoking cessation.;Monitor prescription use compliance.   Expected Outcomes Short Term: Continued assessment and intervention until BP is < 140/4190mm  HG in hypertensive participants. < 130/4080mm HG in hypertensive participants with diabetes, heart failure or chronic kidney disease.;Long Term: Maintenance of blood pressure at goal levels.   Lipids Yes   Intervention Provide education and support for participant on nutrition & aerobic/resistive exercise along with prescribed medications to achieve LDL 70mg , HDL >40mg .   Expected Outcomes Short Term: Participant states understanding of desired cholesterol values and is compliant with medications prescribed. Participant is following exercise prescription and nutrition guidelines.;Long Term: Cholesterol controlled with medications as prescribed, with individualized exercise RX and with personalized nutrition plan. Value goals: LDL < 70mg , HDL > 40 mg.   Stress Yes   Intervention Offer individual and/or small group education and counseling on adjustment to heart disease, stress management and health-related lifestyle change. Teach and support self-help strategies.;Refer participants experiencing significant psychosocial distress to appropriate mental health specialists for further evaluation and treatment. When possible, include family members and significant others in education/counseling sessions.   Expected Outcomes Short Term: Participant demonstrates changes in health-related behavior, relaxation and other stress management skills, ability to obtain effective social support, and compliance with psychotropic medications if prescribed.;Long Term: Emotional wellbeing is indicated by absence of clinically significant psychosocial distress or social isolation.      Tobacco Use Initial Evaluation: History  Smoking Status  . Former Smoker  . Types: Cigarettes  . Quit date: 08/2015  Smokeless Tobacco  . Never Used    Copy of goals given to participant.

## 2016-08-03 NOTE — Progress Notes (Signed)
Cardiac Individual Treatment Plan  Patient Details  Name: Kevin EngCarmen Frisbee MRN: [id number] Date of Birth: [birth year] Referring Provider:   Flowsheet Row Cardiac Rehab [facility]03/2016 [facility]  Referring Provider  Cephas DarbyParachos, Alexander MD      Initial Encounter Date:  Flowsheet Row [facility]  Date  08/03/16  Referring Provider  Cephas DarbyParachos, Alexander MD      Visit Diagnosis: Heart failure, chronic systolic (HCC)  Patient's Home Medications on Admission:  Current Outpatient Prescriptions:  .  acetaminophen (TYLENOL) 325 MG tablet, Take 650 mg by mouth every 6 (six) hours as needed., Disp: , Rfl:  .  albuterol (PROVENTIL HFA;VENTOLIN HFA) 108 (90 Base) MCG/ACT inhaler, Inhale 2 puffs into the lungs every 4 (four) hours as needed for shortness of breath., Disp: , Rfl:  .  carvedilol (COREG) 6.25 MG tablet, Take 6.25 mg by mouth 2 (two) times daily., Disp: , Rfl:  .  clonazePAM (KLONOPIN) 1 MG tablet, Take 1 mg by mouth 3 (three) times daily., Disp: , Rfl:  .  clopidogrel (PLAVIX) 75 MG tablet, Take 75 mg by mouth daily., Disp: , Rfl:  .  dicyclomine (BENTYL) 10 MG capsule, Take 1 capsule (10 mg total) by mouth 3 (three) times daily as needed for spasms., Disp: 20 capsule, Rfl: 0 .  divalproex (DEPAKOTE ER) 500 MG 24 hr tablet, Take by mouth 2 (two) times daily., Disp: , Rfl:  .  furosemide (LASIX) 40 MG tablet, Take 40 mg by mouth 2 (two) times daily., Disp: , Rfl:  .  gabapentin (NEURONTIN) 300 MG capsule, Take 300 mg by mouth 3 (three) times daily., Disp: , Rfl:  .  hydrALAZINE (APRESOLINE) 25 MG tablet, Take 25 mg by mouth 3 (three) times daily., Disp: , Rfl:  .  Hyoscyamine Sulfate SL (LEVSIN/SL) 0.125 MG SUBL, Place 1 tablet under the tongue daily., Disp: 30 each, Rfl: 0 .  ipratropium-albuterol (DUONEB) 0.5-2.5 (3) MG/3ML SOLN, Take 3 mLs by nebulization every 6 (six) hours as needed (wheezing/shortness of  breath)., Disp: , Rfl:  .  losartan (COZAAR) 100 MG tablet, Take 100 mg by mouth daily., Disp: , Rfl:  .  Melatonin 5 MG TABS, Take 1 tablet by mouth at bedtime., Disp: , Rfl:  .  nitroGLYCERIN (NITROSTAT) 0.4 MG SL tablet, Place 0.4 mg under the tongue every 5 (five) minutes as needed for chest pain., Disp: , Rfl:  .  omeprazole (PRILOSEC) 40 MG capsule, Take 40 mg by mouth daily., Disp: , Rfl:  .  ondansetron (ZOFRAN ODT) 4 MG disintegrating tablet, 4mg  ODT q6 hours prn nausea/vomit, Disp: 10 tablet, Rfl: 0 .  oxybutynin (DITROPAN) 5 MG tablet, Take 1 tablet (5 mg total) by mouth 2 (two) times daily., Disp: 60 tablet, Rfl: 0 .  potassium chloride SA (K-DUR,KLOR-CON) 20 MEQ tablet, Take 20 mEq by mouth 2 (two) times daily., Disp: , Rfl:  .  rivaroxaban (XARELTO) 20 MG TABS tablet, Take 20 mg by mouth at bedtime., Disp: , Rfl:  .  rosuvastatin (CRESTOR) 20 MG tablet, Take 20 mg by mouth daily., Disp: , Rfl:  .  tamsulosin (FLOMAX) 0.4 MG CAPS capsule, TAKE ONE CAPSULE BY MOUTH EVERY DAY AFTER SUPPER, Disp: 30 capsule, Rfl: 3 .  traMADol (ULTRAM) 50 MG tablet, Take 50 mg by mouth every 6 (six) hours as needed for moderate pain or severe pain. , Disp: , Rfl:   Past Medical History: Past Medical History:  Diagnosis Date  . Acid reflux   . Anxiety   . Arthritis   . Atrial fibrillation (HCC)   . CHF (congestive heart failure) (HCC)   . Chronic orthostatic hypotension   . Clotting disorder (HCC)   . COPD (chronic obstructive pulmonary disease) (HCC)   . Depression   . Elevated PSA   . Heart attack   . Heart disease   . Heart failure (HCC)   . Hepatitis   . High cholesterol   . Hypertension   . Sleep apnea   . Urinary retention     Tobacco Use: History  Smoking Status  . Former Smoker  . Types: Cigarettes  . Quit date: 08/2015  Smokeless Tobacco  . Never Used    Labs: Recent Review Flowsheet Data    There is no flowsheet data to display.       Exercise Target  Goals: Date: 08/03/16  Exercise Program Goal: Individual exercise prescription set with THRR, safety & activity barriers. Participant demonstrates ability to understand and report RPE using BORG scale, to self-measure pulse accurately, and to acknowledge the importance of the exercise prescription.  Exercise Prescription Goal: Starting with aerobic activity 30 plus minutes a day, 3 days per week for initial exercise prescription. Provide home exercise prescription and guidelines that participant acknowledges understanding prior to discharge.  Activity Barriers & Risk Stratification:     Activity Barriers & Cardiac Risk Stratification - 08/03/16 1309      Activity Barriers & Cardiac Risk Stratification   Activity Barriers Muscular Weakness;Shortness of Breath   Cardiac Risk Stratification High      6 Minute Walk:     6 Minute Walk    Row Name 08/03/16 1533         6 Minute Walk   Phase Initial     Distance 806 feet     Walk Time 5.5 minutes     # of Rest Breaks 1  30 sec     MPH 1.66     METS 2.23     RPE 16     Perceived Dyspnea  3     VO2 Peak 7.08     Symptoms Yes (comment)     Comments SOB     Resting HR 69 bpm     Resting BP 148/72     Max Ex. HR 119 bpm     Max Ex. BP 156/80     2 Minute Post BP 126/70       Interval HR   Baseline HR 69     1 Minute HR 79     2 Minute HR 80     3 Minute HR 82     4 Minute HR 109     5 Minute HR 98     6 Minute HR 97     2 Minute Post HR 84     Interval Heart Rate? Yes       Interval Oxygen   Interval Oxygen? Yes     Baseline Oxygen Saturation % 100 %     Baseline Liters of Oxygen 2 L     1 Minute Oxygen Saturation % 97 %     1 Minute Liters of Oxygen 2 L     2 Minute Oxygen Saturation % 96 %  rest break 2:45-3:15     2 Minute Liters of Oxygen 2 L     3 Minute Oxygen Saturation % 100 %     3 Minute  Liters of Oxygen 2 L     4 Minute Oxygen Saturation % 97 %     4 Minute Liters of Oxygen 2 L     5 Minute Oxygen  Saturation % 98 %     5 Minute Liters of Oxygen 2 L     6 Minute Oxygen Saturation % 96 %     6 Minute Liters of Oxygen 2 L     2 Minute Post Oxygen Saturation % 100 %     2 Minute Post Liters of Oxygen 2 L        Initial Exercise Prescription:     Initial Exercise Prescription - 08/03/16 1500      Date of Initial Exercise RX and Referring Provider   Date 08/03/16   Referring Provider Cephas Darby MD     Oxygen   Oxygen Continuous   Liters 2     Treadmill   MPH 1.3   Grade 0   Minutes 15   METs 2     NuStep   Level 1   Minutes 15   METs 2     Biostep-RELP   Level 2   Minutes 15   METs 2     Prescription Details   Frequency (times per week) 2   Duration Progress to 45 minutes of aerobic exercise without signs/symptoms of physical distress     Intensity   THRR 40-80% of Max Heartrate 104-139   Ratings of Perceived Exertion 11-15   Perceived Dyspnea 0-4     Progression   Progression Continue to progress workloads to maintain intensity without signs/symptoms of physical distress.     Resistance Training   Training Prescription Yes   Weight 3 lbs   Reps 10-15      Perform Capillary Blood Glucose checks as needed.  Exercise Prescription Changes:     Exercise Prescription Changes    Row Name 08/03/16 1400             Exercise Review   Progression -  walk test results         Response to Exercise   Blood Pressure (Admit) 148/72       Blood Pressure (Exercise) 156/80       Blood Pressure (Exit) 126/70       Heart Rate (Admit) 69 bpm       Heart Rate (Exercise) 119 bpm       Heart Rate (Exit) 84 bpm       Oxygen Saturation (Admit) 100 %       Oxygen Saturation (Exercise) 96 %       Rating of Perceived Exertion (Exercise) 16       Perceived Dyspnea (Exercise) 3       Symptoms SOB         Oxygen   Liters 2          Exercise Comments:   Discharge Exercise Prescription (Final Exercise Prescription Changes):     Exercise  Prescription Changes - 08/03/16 1400      Exercise Review   Progression --  walk test results     Response to Exercise   Blood Pressure (Admit) 148/72   Blood Pressure (Exercise) 156/80   Blood Pressure (Exit) 126/70   Heart Rate (Admit) 69 bpm   Heart Rate (Exercise) 119 bpm   Heart Rate (Exit) 84 bpm   Oxygen Saturation (Admit) 100 %   Oxygen Saturation (Exercise) 96 %   Rating of Perceived Exertion (  Exercise) 16   Perceived Dyspnea (Exercise) 3   Symptoms SOB     Oxygen   Liters 2      Nutrition:  Target Goals: Understanding of nutrition guidelines, daily intake of sodium 1500mg , cholesterol 200mg , calories 30% from fat and 7% or less from saturated fats, daily to have 5 or more servings of fruits and vegetables.  Biometrics:     Pre Biometrics - 08/03/16 1540      Pre Biometrics   Height 5' 7.9" (1.725 m)   Weight 264 lb 3.2 oz (119.8 kg)   Waist Circumference 47 inches   Hip Circumference 47 inches   Waist to Hip Ratio 1 %   BMI (Calculated) 40.4   Single Leg Stand 1.89 seconds       Nutrition Therapy Plan and Nutrition Goals:     Nutrition Therapy & Goals - 08/03/16 1253      Intervention Plan   Intervention Prescribe, educate and counsel regarding individualized specific dietary modifications aiming towards targeted core components such as weight, hypertension, lipid management, diabetes, heart failure and other comorbidities.   Expected Outcomes Short Term Goal: Understand basic principles of dietary content, such as calories, fat, sodium, cholesterol and nutrients.;Short Term Goal: A plan has been developed with personal nutrition goals set during dietitian appointment.;Long Term Goal: Adherence to prescribed nutrition plan.      Nutrition Discharge: Rate Your Plate Scores:     Nutrition Assessments - 08/03/16 1254      MEDFICTS Scores   Pre Score 60      Nutrition Goals Re-Evaluation:   Psychosocial: Target Goals: Acknowledge presence  or absence of depression, maximize coping skills, provide positive support system. Participant is able to verbalize types and ability to use techniques and skills needed for reducing stress and depression.  Initial Review & Psychosocial Screening:     Initial Psych Review & Screening - 08/03/16 1312      Initial Review   Comments Has chronic urological problem that requires surgical intervention. On hold until at least March.       Quality of Life Scores:     Quality of Life - 08/03/16 1303      Quality of Life Scores   Health/Function Pre 8.17 %   Socioeconomic Pre 8.43 %   Psych/Spiritual Pre 10.14 %   Family Pre 12.7 %   GLOBAL Pre 9.29 %      PHQ-9: Recent Review Flowsheet Data    [facility] 08/03/2016 08/03/2016   Decreased Interest - 1   Down, Depressed, Hopeless - 1   PHQ - 2 Score - 2   Altered sleeping - 1   Tired, decreased energy - 1   Change in appetite - 1   Feeling bad or failure about yourself  - 2   Trouble concentrating - 1   Moving slowly or fidgety/restless - 2   Suicidal thoughts (No Data)  0   PHQ-9 Score - 10   Difficult doing work/chores - Somewhat difficult      Psychosocial Evaluation and Intervention:   Psychosocial Re-Evaluation:   Vocational Rehabilitation: Provide vocational rehab assistance to qualifying candidates.   Vocational Rehab Evaluation & Intervention:     Vocational Rehab - 08/03/16 1311      Initial Vocational Rehab Evaluation & Intervention   Assessment shows need for Vocational Rehabilitation No      Education: Education Goals: Education classes will be provided on a weekly basis, covering required topics. Participant will state  understanding/return demonstration of topics presented.  Learning Barriers/Preferences:     Learning Barriers/Preferences - 08/03/16 1310      Learning Barriers/Preferences   Learning Barriers Hearing   Learning Preferences Skilled Demonstration      Education  Topics: General Nutrition Guidelines/Fats and Fiber: -Group instruction provided by verbal, written material, models and posters to present the general guidelines for heart healthy nutrition. Gives an explanation and review of dietary fats and fiber.   Controlling Sodium/Reading Food Labels: -Group verbal and written material supporting the discussion of sodium use in heart healthy nutrition. Review and explanation with models, verbal and written materials for utilization of the food label.   Exercise Physiology & Risk Factors: - Group verbal and written instruction with models to review the exercise physiology of the cardiovascular system and associated critical values. Details cardiovascular disease risk factors and the goals associated with each risk factor.   Aerobic Exercise & Resistance Training: - Gives group verbal and written discussion on the health impact of inactivity. On the components of aerobic and resistive training programs and the benefits of this training and how to safely progress through these programs.   Flexibility, Balance, General Exercise Guidelines: - Provides group verbal and written instruction on the benefits of flexibility and balance training programs. Provides general exercise guidelines with specific guidelines to those with heart or lung disease. Demonstration and skill practice provided.   Stress Management: - Provides group verbal and written instruction about the health risks of elevated stress, cause of high stress, and healthy ways to reduce stress.   Depression: - Provides group verbal and written instruction on the correlation between heart/lung disease and depressed mood, treatment options, and the stigmas associated with seeking treatment.   Anatomy & Physiology of the Heart: - Group verbal and written instruction and models provide basic cardiac anatomy and physiology, with the coronary electrical and arterial systems. Review of: AMI, Angina,  Valve disease, Heart Failure, Cardiac Arrhythmia, Pacemakers, and the ICD.   Cardiac Procedures: - Group verbal and written instruction and models to describe the testing methods done to diagnose heart disease. Reviews the outcomes of the test results. Describes the treatment choices: Medical Management, Angioplasty, or Coronary Bypass Surgery.   Cardiac Medications: - Group verbal and written instruction to review commonly prescribed medications for heart disease. Reviews the medication, class of the drug, and side effects. Includes the steps to properly store meds and maintain the prescription regimen.   Go Sex-Intimacy & Heart Disease, Get SMART - Goal Setting: - Group verbal and written instruction through game format to discuss heart disease and the return to sexual intimacy. Provides group verbal and written material to discuss and apply goal setting through the application of the S.M.A.R.T. Method.   Other Matters of the Heart: - Provides group verbal, written materials and models to describe Heart Failure, Angina, Valve Disease, and Diabetes in the realm of heart disease. Includes description of the disease process and treatment options available to the cardiac patient.   Exercise & Equipment Safety: - Individual verbal instruction and demonstration of equipment use and safety with use of the equipment. Flowsheet Row Cardiac Rehab from 08/03/2016 in [facility] Cardiac and Pulmonary Rehab  Date  08/03/16  Educator  SB  Instruction Review Code  2- meets goals/outcomes      Infection Prevention: - Provides verbal and written material to individual with discussion of infection control including proper hand washing and proper equipment cleaning during exercise session. Flowsheet Row Cardiac Rehab from 08/03/2016  in [facility] Cardiac and Pulmonary Rehab  Date  08/03/16  Educator  Sb  Instruction Review Code  2- meets goals/outcomes      Falls Prevention: - Provides verbal and written  material to individual with discussion of falls prevention and safety. Flowsheet Row Cardiac Rehab from 08/03/2016 in [facility] Cardiac and Pulmonary Rehab  Date  08/03/16  Educator  Sb  Instruction Review Code  2- meets goals/outcomes      Diabetes: - Individual verbal and written instruction to review signs/symptoms of diabetes, desired ranges of glucose level fasting, after meals and with exercise. Advice that pre and post exercise glucose checks will be done for 3 sessions at entry of program.    Knowledge Questionnaire Score:     Knowledge Questionnaire Score - 08/03/16 1310      Knowledge Questionnaire Score   Pre Score 25/28  responses reviewed wiht verbal response of understanding      Core Components/Risk Factors/Patient Goals at Admission:     Personal Goals and Risk Factors at Admission - 08/03/16 1311      Core Components/Risk Factors/Patient Goals on Admission    Weight Management Yes;Obesity;Weight Loss   Admit Weight 264 lb 3.2 oz (119.8 kg)   Goal Weight: Short Term 260 lb (117.9 kg)   Goal Weight: Long Term 180 lb (81.6 kg)   Expected Outcomes Short Term: Continue to assess and modify interventions until short term weight is achieved;Long Term: Adherence to nutrition and physical activity/exercise program aimed toward attainment of established weight goal;Weight Loss: Understanding of general recommendations for a balanced deficit meal plan, which promotes 1-2 lb weight loss per week and includes a negative energy balance of [phone] kcal/d;Understanding recommendations for meals to include 15-35% energy as protein, 25-35% energy from fat, 35-60% energy from carbohydrates, less than 200mg  of dietary cholesterol, 20-35 gm of total fiber daily;Understanding of distribution of calorie intake throughout the day with the consumption of 4-5 meals/snacks   Sedentary Yes   Intervention Provide advice, education, support and counseling about physical activity/exercise  needs.;Develop an individualized exercise prescription for aerobic and resistive training based on initial evaluation findings, risk stratification, comorbidities and participant's personal goals.   Expected Outcomes Achievement of increased cardiorespiratory fitness and enhanced flexibility, muscular endurance and strength shown through measurements of functional capacity and personal statement of participant.   Increase Strength and Stamina Yes   Intervention Provide advice, education, support and counseling about physical activity/exercise needs.;Develop an individualized exercise prescription for aerobic and resistive training based on initial evaluation findings, risk stratification, comorbidities and participant's personal goals.   Expected Outcomes Achievement of increased cardiorespiratory fitness and enhanced flexibility, muscular endurance and strength shown through measurements of functional capacity and personal statement of participant.   Improve shortness of breath with ADL's Yes  Has COPD, wears oxygen as prescribed   Intervention Provide education, individualized exercise plan and daily activity instruction to help decrease symptoms of SOB with activities of daily living.   Expected Outcomes Short Term: Achieves a reduction of symptoms when performing activities of daily living.   Heart Failure Yes   Intervention Provide a combined exercise and nutrition program that is supplemented with education, support and counseling about heart failure. Directed toward relieving symptoms such as shortness of breath, decreased exercise tolerance, and extremity edema.   Expected Outcomes Improve functional capacity of life;Short term: Attendance in program 2-3 days a week with increased exercise capacity. Reported lower sodium intake. Reported increased fruit and vegetable intake. Reports medication compliance.;Short term: Daily  weights obtained and reported for increase. Utilizing diuretic protocols  set by physician.;Long term: Adoption of self-care skills and reduction of barriers for early signs and symptoms recognition and intervention leading to self-care maintenance.   Hypertension Yes   Intervention Provide education on lifestyle modifcations including regular physical activity/exercise, weight management, moderate sodium restriction and increased consumption of fresh fruit, vegetables, and low fat dairy, alcohol moderation, and smoking cessation.;Monitor prescription use compliance.   Expected Outcomes Short Term: Continued assessment and intervention until BP is < 140/64mm HG in hypertensive participants. < 130/10mm HG in hypertensive participants with diabetes, heart failure or chronic kidney disease.;Long Term: Maintenance of blood pressure at goal levels.   Lipids Yes   Intervention Provide education and support for participant on nutrition & aerobic/resistive exercise along with prescribed medications to achieve LDL 70mg , HDL >40mg .   Expected Outcomes Short Term: Participant states understanding of desired cholesterol values and is compliant with medications prescribed. Participant is following exercise prescription and nutrition guidelines.;Long Term: Cholesterol controlled with medications as prescribed, with individualized exercise RX and with personalized nutrition plan. Value goals: LDL < 70mg , HDL > 40 mg.   Stress Yes   Intervention Offer individual and/or small group education and counseling on adjustment to heart disease, stress management and health-related lifestyle change. Teach and support self-help strategies.;Refer participants experiencing significant psychosocial distress to appropriate mental health specialists for further evaluation and treatment. When possible, include family members and significant others in education/counseling sessions.   Expected Outcomes Short Term: Participant demonstrates changes in health-related behavior, relaxation and other stress management  skills, ability to obtain effective social support, and compliance with psychotropic medications if prescribed.;Long Term: Emotional wellbeing is indicated by absence of clinically significant psychosocial distress or social isolation.      Core Components/Risk Factors/Patient Goals Review:    Core Components/Risk Factors/Patient Goals at Discharge (Final Review):    ITP Comments:     ITP Comments    Row Name 08/03/16 1249           ITP Comments Medical Review completed. Initial ITP created.   Documentation of diagnosis can be found Encounter Office visit on 07/03/2016          Comments: Initial ITP

## 2016-08-05 ENCOUNTER — Encounter: Payer: Self-pay | Admitting: *Deleted

## 2016-08-05 DIAGNOSIS — I5022 Chronic systolic (congestive) heart failure: Secondary | ICD-10-CM

## 2016-08-05 NOTE — Progress Notes (Signed)
Cardiac Individual Treatment Plan  Patient Details  Name: Kevin Pearson MRN: [id number] Date of Birth: [birth year] Referring Provider:   Flowsheet Row Cardiac Rehab [facility]03/2016 [facility]  Referring Provider  Cephas DarbyParachos, Alexander MD      Initial Encounter Date:  Flowsheet Row [facility]  Date  08/03/16  Referring Provider  Cephas DarbyParachos, Alexander MD      Visit Diagnosis: Heart failure, chronic systolic (HCC)  Patient's Home Medications on Admission:  Current Outpatient Prescriptions:  .  acetaminophen (TYLENOL) 325 MG tablet, Take 650 mg by mouth every 6 (six) hours as needed., Disp: , Rfl:  .  albuterol (PROVENTIL HFA;VENTOLIN HFA) 108 (90 Base) MCG/ACT inhaler, Inhale 2 puffs into the lungs every 4 (four) hours as needed for shortness of breath., Disp: , Rfl:  .  carvedilol (COREG) 6.25 MG tablet, Take 6.25 mg by mouth 2 (two) times daily., Disp: , Rfl:  .  clonazePAM (KLONOPIN) 1 MG tablet, Take 1 mg by mouth 3 (three) times daily., Disp: , Rfl:  .  clopidogrel (PLAVIX) 75 MG tablet, Take 75 mg by mouth daily., Disp: , Rfl:  .  dicyclomine (BENTYL) 10 MG capsule, Take 1 capsule (10 mg total) by mouth 3 (three) times daily as needed for spasms., Disp: 20 capsule, Rfl: 0 .  divalproex (DEPAKOTE ER) 500 MG 24 hr tablet, Take by mouth 2 (two) times daily., Disp: , Rfl:  .  furosemide (LASIX) 40 MG tablet, Take 40 mg by mouth 2 (two) times daily., Disp: , Rfl:  .  gabapentin (NEURONTIN) 300 MG capsule, Take 300 mg by mouth 3 (three) times daily., Disp: , Rfl:  .  hydrALAZINE (APRESOLINE) 25 MG tablet, Take 25 mg by mouth 3 (three) times daily., Disp: , Rfl:  .  Hyoscyamine Sulfate SL (LEVSIN/SL) 0.125 MG SUBL, Place 1 tablet under the tongue daily., Disp: 30 each, Rfl: 0 .  ipratropium-albuterol (DUONEB) 0.5-2.5 (3) MG/3ML SOLN, Take 3 mLs by nebulization every 6 (six) hours as needed (wheezing/shortness of  breath)., Disp: , Rfl:  .  losartan (COZAAR) 100 MG tablet, Take 100 mg by mouth daily., Disp: , Rfl:  .  Melatonin 5 MG TABS, Take 1 tablet by mouth at bedtime., Disp: , Rfl:  .  nitroGLYCERIN (NITROSTAT) 0.4 MG SL tablet, Place 0.4 mg under the tongue every 5 (five) minutes as needed for chest pain., Disp: , Rfl:  .  omeprazole (PRILOSEC) 40 MG capsule, Take 40 mg by mouth daily., Disp: , Rfl:  .  ondansetron (ZOFRAN ODT) 4 MG disintegrating tablet, 4mg  ODT q6 hours prn nausea/vomit, Disp: 10 tablet, Rfl: 0 .  oxybutynin (DITROPAN) 5 MG tablet, Take 1 tablet (5 mg total) by mouth 2 (two) times daily., Disp: 60 tablet, Rfl: 0 .  potassium chloride SA (K-DUR,KLOR-CON) 20 MEQ tablet, Take 20 mEq by mouth 2 (two) times daily., Disp: , Rfl:  .  rivaroxaban (XARELTO) 20 MG TABS tablet, Take 20 mg by mouth at bedtime., Disp: , Rfl:  .  rosuvastatin (CRESTOR) 20 MG tablet, Take 20 mg by mouth daily., Disp: , Rfl:  .  tamsulosin (FLOMAX) 0.4 MG CAPS capsule, TAKE ONE CAPSULE BY MOUTH EVERY DAY AFTER SUPPER, Disp: 30 capsule, Rfl: 3 .  traMADol (ULTRAM) 50 MG tablet, Take 50 mg by mouth every 6 (six) hours as needed for moderate pain or severe pain. , Disp: , Rfl:   Past Medical History: Past Medical History:  Diagnosis Date  . Acid reflux   . Anxiety   . Arthritis   . Atrial fibrillation (HCC)   . CHF (congestive heart failure) (HCC)   . Chronic orthostatic hypotension   . Clotting disorder (HCC)   . COPD (chronic obstructive pulmonary disease) (HCC)   . Depression   . Elevated PSA   . Heart attack   . Heart disease   . Heart failure (HCC)   . Hepatitis   . High cholesterol   . Hypertension   . Sleep apnea   . Urinary retention     Tobacco Use: History  Smoking Status  . Former Smoker  . Types: Cigarettes  . Quit date: 08/2015  Smokeless Tobacco  . Never Used    Labs: Recent Review Flowsheet Data    There is no flowsheet data to display.       Exercise Target Goals:     Exercise Program Goal: Individual exercise prescription set with THRR, safety & activity barriers. Participant demonstrates ability to understand and report RPE using BORG scale, to self-measure pulse accurately, and to acknowledge the importance of the exercise prescription.  Exercise Prescription Goal: Starting with aerobic activity 30 plus minutes a day, 3 days per week for initial exercise prescription. Provide home exercise prescription and guidelines that participant acknowledges understanding prior to discharge.  Activity Barriers & Risk Stratification:     Activity Barriers & Cardiac Risk Stratification - 08/03/16 1309      Activity Barriers & Cardiac Risk Stratification   Activity Barriers Muscular Weakness;Shortness of Breath   Cardiac Risk Stratification High      6 Minute Walk:     6 Minute Walk    Row Name 08/03/16 1533         6 Minute Walk   Phase Initial     Distance 806 feet     Walk Time 5.5 minutes     # of Rest Breaks 1  30 sec     MPH 1.66     METS 2.23     RPE 16     Perceived Dyspnea  3     VO2 Peak 7.08     Symptoms Yes (comment)     Comments SOB     Resting HR 69 bpm     Resting BP 148/72     Max Ex. HR 119 bpm     Max Ex. BP 156/80     2 Minute Post BP 126/70       Interval HR   Baseline HR 69     1 Minute HR 79     2 Minute HR 80     3 Minute HR 82     4 Minute HR 109     5 Minute HR 98     6 Minute HR 97     2 Minute Post HR 84     Interval Heart Rate? Yes       Interval Oxygen   Interval Oxygen? Yes     Baseline Oxygen Saturation % 100 %     Baseline Liters of Oxygen 2 L     1 Minute Oxygen Saturation % 97 %     1 Minute Liters of Oxygen 2 L     2 Minute Oxygen Saturation % 96 %  rest break 2:45-3:15     2 Minute Liters of Oxygen 2 L     3 Minute Oxygen Saturation % 100 %     3 Minute  Liters of Oxygen 2 L     4 Minute Oxygen Saturation % 97 %     4 Minute Liters of Oxygen 2 L     5 Minute Oxygen Saturation % 98 %      5 Minute Liters of Oxygen 2 L     6 Minute Oxygen Saturation % 96 %     6 Minute Liters of Oxygen 2 L     2 Minute Post Oxygen Saturation % 100 %     2 Minute Post Liters of Oxygen 2 L        Initial Exercise Prescription:     Initial Exercise Prescription - 08/03/16 1500      Date of Initial Exercise RX and Referring Provider   Date 08/03/16   Referring Provider Cephas Darby MD     Oxygen   Oxygen Continuous   Liters 2     Treadmill   MPH 1.3   Grade 0   Minutes 15   METs 2     NuStep   Level 1   Minutes 15   METs 2     Biostep-RELP   Level 2   Minutes 15   METs 2     Prescription Details   Frequency (times per week) 2   Duration Progress to 45 minutes of aerobic exercise without signs/symptoms of physical distress     Intensity   THRR 40-80% of Max Heartrate 104-139   Ratings of Perceived Exertion 11-15   Perceived Dyspnea 0-4     Progression   Progression Continue to progress workloads to maintain intensity without signs/symptoms of physical distress.     Resistance Training   Training Prescription Yes   Weight 3 lbs   Reps 10-15      Perform Capillary Blood Glucose checks as needed.  Exercise Prescription Changes:     Exercise Prescription Changes    Row Name 08/03/16 1400             Exercise Review   Progression -  walk test results         Response to Exercise   Blood Pressure (Admit) 148/72       Blood Pressure (Exercise) 156/80       Blood Pressure (Exit) 126/70       Heart Rate (Admit) 69 bpm       Heart Rate (Exercise) 119 bpm       Heart Rate (Exit) 84 bpm       Oxygen Saturation (Admit) 100 %       Oxygen Saturation (Exercise) 96 %       Rating of Perceived Exertion (Exercise) 16       Perceived Dyspnea (Exercise) 3       Symptoms SOB         Oxygen   Liters 2          Exercise Comments:   Discharge Exercise Prescription (Final Exercise Prescription Changes):     Exercise Prescription Changes -  08/03/16 1400      Exercise Review   Progression --  walk test results     Response to Exercise   Blood Pressure (Admit) 148/72   Blood Pressure (Exercise) 156/80   Blood Pressure (Exit) 126/70   Heart Rate (Admit) 69 bpm   Heart Rate (Exercise) 119 bpm   Heart Rate (Exit) 84 bpm   Oxygen Saturation (Admit) 100 %   Oxygen Saturation (Exercise) 96 %   Rating of Perceived Exertion (  Exercise) 16   Perceived Dyspnea (Exercise) 3   Symptoms SOB     Oxygen   Liters 2      Nutrition:  Target Goals: Understanding of nutrition guidelines, daily intake of sodium 1500mg , cholesterol 200mg , calories 30% from fat and 7% or less from saturated fats, daily to have 5 or more servings of fruits and vegetables.  Biometrics:     Pre Biometrics - 08/03/16 1540      Pre Biometrics   Height 5' 7.9" (1.725 m)   Weight 264 lb 3.2 oz (119.8 kg)   Waist Circumference 47 inches   Hip Circumference 47 inches   Waist to Hip Ratio 1 %   BMI (Calculated) 40.4   Single Leg Stand 1.89 seconds       Nutrition Therapy Plan and Nutrition Goals:     Nutrition Therapy & Goals - 08/03/16 1253      Intervention Plan   Intervention Prescribe, educate and counsel regarding individualized specific dietary modifications aiming towards targeted core components such as weight, hypertension, lipid management, diabetes, heart failure and other comorbidities.   Expected Outcomes Short Term Goal: Understand basic principles of dietary content, such as calories, fat, sodium, cholesterol and nutrients.;Short Term Goal: A plan has been developed with personal nutrition goals set during dietitian appointment.;Long Term Goal: Adherence to prescribed nutrition plan.      Nutrition Discharge: Rate Your Plate Scores:     Nutrition Assessments - 08/03/16 1254      MEDFICTS Scores   Pre Score 60      Nutrition Goals Re-Evaluation:   Psychosocial: Target Goals: Acknowledge presence or absence of  depression, maximize coping skills, provide positive support system. Participant is able to verbalize types and ability to use techniques and skills needed for reducing stress and depression.  Initial Review & Psychosocial Screening:     Initial Psych Review & Screening - 08/03/16 1312      Initial Review   Comments Has chronic urological problem that requires surgical intervention. On hold until at least March.       Quality of Life Scores:     Quality of Life - 08/03/16 1303      Quality of Life Scores   Health/Function Pre 8.17 %   Socioeconomic Pre 8.43 %   Psych/Spiritual Pre 10.14 %   Family Pre 12.7 %   GLOBAL Pre 9.29 %      PHQ-9: Recent Review Flowsheet Data    Depression screen [facility] 2/9 08/03/2016 08/03/2016   Decreased Interest - 1   Down, Depressed, Hopeless - 1   PHQ - 2 Score - 2   Altered sleeping - 1   Tired, decreased energy - 1   Change in appetite - 1   Feeling bad or failure about yourself  - 2   Trouble concentrating - 1   Moving slowly or fidgety/restless - 2   Suicidal thoughts (No Data)  0   PHQ-9 Score - 10   Difficult doing work/chores - Somewhat difficult      Psychosocial Evaluation and Intervention:   Psychosocial Re-Evaluation:   Vocational Rehabilitation: Provide vocational rehab assistance to qualifying candidates.   Vocational Rehab Evaluation & Intervention:     Vocational Rehab - 08/03/16 1311      Initial Vocational Rehab Evaluation & Intervention   Assessment shows need for Vocational Rehabilitation No      Education: Education Goals: Education classes will be provided on a weekly basis, covering required topics. Participant will state  understanding/return demonstration of topics presented.  Learning Barriers/Preferences:     Learning Barriers/Preferences - 08/03/16 1310      Learning Barriers/Preferences   Learning Barriers Hearing   Learning Preferences Skilled Demonstration      Education  Topics: General Nutrition Guidelines/Fats and Fiber: -Group instruction provided by verbal, written material, models and posters to present the general guidelines for heart healthy nutrition. Gives an explanation and review of dietary fats and fiber.   Controlling Sodium/Reading Food Labels: -Group verbal and written material supporting the discussion of sodium use in heart healthy nutrition. Review and explanation with models, verbal and written materials for utilization of the food label.   Exercise Physiology & Risk Factors: - Group verbal and written instruction with models to review the exercise physiology of the cardiovascular system and associated critical values. Details cardiovascular disease risk factors and the goals associated with each risk factor.   Aerobic Exercise & Resistance Training: - Gives group verbal and written discussion on the health impact of inactivity. On the components of aerobic and resistive training programs and the benefits of this training and how to safely progress through these programs.   Flexibility, Balance, General Exercise Guidelines: - Provides group verbal and written instruction on the benefits of flexibility and balance training programs. Provides general exercise guidelines with specific guidelines to those with heart or lung disease. Demonstration and skill practice provided.   Stress Management: - Provides group verbal and written instruction about the health risks of elevated stress, cause of high stress, and healthy ways to reduce stress.   Depression: - Provides group verbal and written instruction on the correlation between heart/lung disease and depressed mood, treatment options, and the stigmas associated with seeking treatment.   Anatomy & Physiology of the Heart: - Group verbal and written instruction and models provide basic cardiac anatomy and physiology, with the coronary electrical and arterial systems. Review of: AMI, Angina,  Valve disease, Heart Failure, Cardiac Arrhythmia, Pacemakers, and the ICD.   Cardiac Procedures: - Group verbal and written instruction and models to describe the testing methods done to diagnose heart disease. Reviews the outcomes of the test results. Describes the treatment choices: Medical Management, Angioplasty, or Coronary Bypass Surgery.   Cardiac Medications: - Group verbal and written instruction to review commonly prescribed medications for heart disease. Reviews the medication, class of the drug, and side effects. Includes the steps to properly store meds and maintain the prescription regimen.   Go Sex-Intimacy & Heart Disease, Get SMART - Goal Setting: - Group verbal and written instruction through game format to discuss heart disease and the return to sexual intimacy. Provides group verbal and written material to discuss and apply goal setting through the application of the S.M.A.R.T. Method.   Other Matters of the Heart: - Provides group verbal, written materials and models to describe Heart Failure, Angina, Valve Disease, and Diabetes in the realm of heart disease. Includes description of the disease process and treatment options available to the cardiac patient.   Exercise & Equipment Safety: - Individual verbal instruction and demonstration of equipment use and safety with use of the equipment. Flowsheet Row Cardiac Rehab from 08/03/2016 in [facility] Cardiac and Pulmonary Rehab  Date  08/03/16  Educator  SB  Instruction Review Code  2- meets goals/outcomes      Infection Prevention: - Provides verbal and written material to individual with discussion of infection control including proper hand washing and proper equipment cleaning during exercise session. Flowsheet Row Cardiac Rehab from 08/03/2016  in [facility] Cardiac and Pulmonary Rehab  Date  08/03/16  Educator  Sb  Instruction Review Code  2- meets goals/outcomes      Falls Prevention: - Provides verbal and written  material to individual with discussion of falls prevention and safety. Flowsheet Row Cardiac Rehab from 08/03/2016 in [facility] Cardiac and Pulmonary Rehab  Date  08/03/16  Educator  Sb  Instruction Review Code  2- meets goals/outcomes      Diabetes: - Individual verbal and written instruction to review signs/symptoms of diabetes, desired ranges of glucose level fasting, after meals and with exercise. Advice that pre and post exercise glucose checks will be done for 3 sessions at entry of program.    Knowledge Questionnaire Score:     Knowledge Questionnaire Score - 08/03/16 1310      Knowledge Questionnaire Score   Pre Score 25/28  responses reviewed wiht verbal response of understanding      Core Components/Risk Factors/Patient Goals at Admission:     Personal Goals and Risk Factors at Admission - 08/03/16 1311      Core Components/Risk Factors/Patient Goals on Admission    Weight Management Yes;Obesity;Weight Loss   Admit Weight 264 lb 3.2 oz (119.8 kg)   Goal Weight: Short Term 260 lb (117.9 kg)   Goal Weight: Long Term 180 lb (81.6 kg)   Expected Outcomes Short Term: Continue to assess and modify interventions until short term weight is achieved;Long Term: Adherence to nutrition and physical activity/exercise program aimed toward attainment of established weight goal;Weight Loss: Understanding of general recommendations for a balanced deficit meal plan, which promotes 1-2 lb weight loss per week and includes a negative energy balance of [phone] kcal/d;Understanding recommendations for meals to include 15-35% energy as protein, 25-35% energy from fat, 35-60% energy from carbohydrates, less than 200mg  of dietary cholesterol, 20-35 gm of total fiber daily;Understanding of distribution of calorie intake throughout the day with the consumption of 4-5 meals/snacks   Sedentary Yes   Intervention Provide advice, education, support and counseling about physical activity/exercise  needs.;Develop an individualized exercise prescription for aerobic and resistive training based on initial evaluation findings, risk stratification, comorbidities and participant's personal goals.   Expected Outcomes Achievement of increased cardiorespiratory fitness and enhanced flexibility, muscular endurance and strength shown through measurements of functional capacity and personal statement of participant.   Increase Strength and Stamina Yes   Intervention Provide advice, education, support and counseling about physical activity/exercise needs.;Develop an individualized exercise prescription for aerobic and resistive training based on initial evaluation findings, risk stratification, comorbidities and participant's personal goals.   Expected Outcomes Achievement of increased cardiorespiratory fitness and enhanced flexibility, muscular endurance and strength shown through measurements of functional capacity and personal statement of participant.   Improve shortness of breath with ADL's Yes  Has COPD, wears oxygen as prescribed   Intervention Provide education, individualized exercise plan and daily activity instruction to help decrease symptoms of SOB with activities of daily living.   Expected Outcomes Short Term: Achieves a reduction of symptoms when performing activities of daily living.   Heart Failure Yes   Intervention Provide a combined exercise and nutrition program that is supplemented with education, support and counseling about heart failure. Directed toward relieving symptoms such as shortness of breath, decreased exercise tolerance, and extremity edema.   Expected Outcomes Improve functional capacity of life;Short term: Attendance in program 2-3 days a week with increased exercise capacity. Reported lower sodium intake. Reported increased fruit and vegetable intake. Reports medication compliance.;Short term: Daily  weights obtained and reported for increase. Utilizing diuretic protocols  set by physician.;Long term: Adoption of self-care skills and reduction of barriers for early signs and symptoms recognition and intervention leading to self-care maintenance.   Hypertension Yes   Intervention Provide education on lifestyle modifcations including regular physical activity/exercise, weight management, moderate sodium restriction and increased consumption of fresh fruit, vegetables, and low fat dairy, alcohol moderation, and smoking cessation.;Monitor prescription use compliance.   Expected Outcomes Short Term: Continued assessment and intervention until BP is < 140/57mm HG in hypertensive participants. < 130/19mm HG in hypertensive participants with diabetes, heart failure or chronic kidney disease.;Long Term: Maintenance of blood pressure at goal levels.   Lipids Yes   Intervention Provide education and support for participant on nutrition & aerobic/resistive exercise along with prescribed medications to achieve LDL 70mg , HDL >40mg .   Expected Outcomes Short Term: Participant states understanding of desired cholesterol values and is compliant with medications prescribed. Participant is following exercise prescription and nutrition guidelines.;Long Term: Cholesterol controlled with medications as prescribed, with individualized exercise RX and with personalized nutrition plan. Value goals: LDL < 70mg , HDL > 40 mg.   Stress Yes   Intervention Offer individual and/or small group education and counseling on adjustment to heart disease, stress management and health-related lifestyle change. Teach and support self-help strategies.;Refer participants experiencing significant psychosocial distress to appropriate mental health specialists for further evaluation and treatment. When possible, include family members and significant others in education/counseling sessions.   Expected Outcomes Short Term: Participant demonstrates changes in health-related behavior, relaxation and other stress management  skills, ability to obtain effective social support, and compliance with psychotropic medications if prescribed.;Long Term: Emotional wellbeing is indicated by absence of clinically significant psychosocial distress or social isolation.      Core Components/Risk Factors/Patient Goals Review:    Core Components/Risk Factors/Patient Goals at Discharge (Final Review):    ITP Comments:     ITP Comments    Row Name 08/03/16 1249 08/05/16 0628         ITP Comments Medical Review completed. Initial ITP created.   Documentation of diagnosis can be found Encounter Office visit on 07/03/2016 30 day review. Continue with ITP unless directed changes per Medical Director review. New to program         Comments:

## 2016-08-11 ENCOUNTER — Encounter: Payer: BLUE CROSS/BLUE SHIELD | Attending: Cardiology | Admitting: Respiratory Therapy

## 2016-08-11 DIAGNOSIS — I5022 Chronic systolic (congestive) heart failure: Secondary | ICD-10-CM | POA: Diagnosis present

## 2016-08-11 NOTE — Progress Notes (Addendum)
Daily Session Note  Patient Details  Name: Kevin Pearson MRN: [id number] Date of Birth: [birth year] Referring Provider:   Flowsheet Row Cardiac Rehab from 08/03/2016 in [facility] Cardiac and Pulmonary Rehab  Referring Provider  Peggye Form MD      Encounter Date: 08/11/2016  Check In:     Session Check In - 08/11/16 0850      Check-In   Location [facility]-Cardiac & Pulmonary Rehab   Staff Present Alberteen Sam, MA, ACSM RCEP, Exercise Physiologist;Laureen Owens Shark, BS, RRT, Respiratory Therapist;Carroll Enterkin, RN, BSN   Supervising physician immediately available to respond to emergencies See telemetry face sheet for immediately available ER MD   Medication changes reported     No   Fall or balance concerns reported    No   Warm-up and Cool-down Performed on first and last piece of equipment   Resistance Training Performed Yes   VAD Patient? No     Pain Assessment   Currently in Pain? No/denies   Multiple Pain Sites No         Goals Met:  Proper associated with RPD/PD & O2 Sat Independence with exercise equipment Exercise tolerated well No report of cardiac concerns or symptoms Strength training completed today  Goals Unmet:  Not Applicable  Comments: First full day of exercise!  Patient was oriented to gym and equipment including functions, settings, policies, and procedures.  Patient's individual exercise prescription and treatment plan were reviewed.  All starting workloads were established based on the results of the 6 minute walk test done at initial orientation visit.  The plan for exercise progression was also introduced and progression will be customized based on patient's performance and goals.   Dr. Emily Filbert is Medical Director for [place] and [name].

## 2016-08-13 DIAGNOSIS — I5022 Chronic systolic (congestive) heart failure: Secondary | ICD-10-CM | POA: Diagnosis not present

## 2016-08-13 NOTE — Progress Notes (Signed)
Daily Session Note  Patient Details  Name: Kevin Pearson MRN: [id number] Date of Birth: 1951/03/01 Referring Provider:   Flowsheet Row Cardiac Rehab from 08/03/2016 in [facility] Cardiac and Pulmonary Rehab  Referring Provider  Peggye Form MD      Encounter Date: 08/13/2016  Check In:     Session Check In - 08/13/16 0838      Check-In   Location [facility]-Cardiac & Pulmonary Rehab   Staff Present Alberteen Sam, MA, ACSM RCEP, Exercise Physiologist;Patricia Surles RN Vickki Hearing, BA, ACSM CEP, Exercise Physiologist   Supervising physician immediately available to respond to emergencies See telemetry face sheet for immediately available ER MD   Medication changes reported     No   Fall or balance concerns reported    No   Warm-up and Cool-down Performed on first and last piece of equipment   Resistance Training Performed Yes   VAD Patient? No     Pain Assessment   Currently in Pain? No/denies           Exercise Prescription Changes - 08/12/16 1400      Exercise Review   Progression --  First Full Day of Exercise     Response to Exercise   Blood Pressure (Admit) 106/70   Blood Pressure (Exercise) 130/80   Blood Pressure (Exit) 126/70   Heart Rate (Admit) 69 bpm   Heart Rate (Exercise) 98 bpm   Heart Rate (Exit) 53 bpm   Rating of Perceived Exertion (Exercise) 9   Symptoms none   Duration Progress to 45 minutes of aerobic exercise without signs/symptoms of physical distress   Intensity THRR unchanged     Progression   Progression Continue to progress workloads to maintain intensity without signs/symptoms of physical distress.   Average METs 1.35     Resistance Training   Training Prescription Yes   Weight 3 lbs   Reps 10-15     Interval Training   Interval Training No     NuStep   Level 1   Minutes 15   METs 1.7     Biostep-RELP   Level 2   Minutes 15   METs 1      Goals Met:  Independence with exercise equipment Exercise tolerated well No  report of cardiac concerns or symptoms Strength training completed today  Goals Unmet:  Not Applicable  Comments: Pt able to follow exercise prescription today without complaint.  Will continue to monitor for progression.    Dr. Emily Filbert is Medical Director for [place] and [name].

## 2016-08-18 ENCOUNTER — Encounter: Payer: BLUE CROSS/BLUE SHIELD | Admitting: Respiratory Therapy

## 2016-08-18 DIAGNOSIS — I5022 Chronic systolic (congestive) heart failure: Secondary | ICD-10-CM

## 2016-08-18 NOTE — Progress Notes (Signed)
Daily Session Note  Patient Details  Name: Kevin Pearson MRN: [id number] Date of Birth: [birth year] Referring Provider:   Flowsheet Row Cardiac Rehab from 08/03/2016 in [facility] Cardiac and Pulmonary Rehab  Referring Provider  Peggye Form MD      Encounter Date: 08/18/2016  Check In:     Session Check In - 08/18/16 0830      Check-In   Location [facility]-Cardiac & Pulmonary Rehab   Staff Present Alberteen Sam, MA, ACSM RCEP, Exercise Physiologist;Laureen Owens Shark, BS, RRT, Respiratory Therapist;Carroll Enterkin, RN, BSN   Supervising physician immediately available to respond to emergencies See telemetry face sheet for immediately available ER MD   Medication changes reported     Yes   Comments Duoneb added   Fall or balance concerns reported    No   Warm-up and Cool-down Performed on first and last piece of equipment   Resistance Training Performed Yes   VAD Patient? No     Pain Assessment   Currently in Pain? No/denies   Multiple Pain Sites No         Goals Met:  Proper associated with RPD/PD & O2 Sat Independence with exercise equipment Exercise tolerated well No report of cardiac concerns or symptoms Strength training completed today  Goals Unmet:  Not Applicable  Comments: Pt able to follow exercise prescription today without complaint.  Will continue to monitor for progression.   Dr. Emily Filbert is Medical Director for [place] and [name].

## 2016-08-19 ENCOUNTER — Other Ambulatory Visit: Payer: Self-pay

## 2016-08-20 ENCOUNTER — Telehealth: Payer: Self-pay

## 2016-08-20 DIAGNOSIS — I5022 Chronic systolic (congestive) heart failure: Secondary | ICD-10-CM

## 2016-08-20 NOTE — Telephone Encounter (Signed)
Spoke with pt daughter, Kevin Pearson, in reference to CIC and possibly developing scar tissue at bladder neck. Reinforced with Cara to continue cathing at this point. Cara voiced understanding. Kevin Pearson inquired about pt getting surgery prior to 3months. Please advise.

## 2016-08-20 NOTE — Progress Notes (Signed)
Daily Session Note  Patient Details  Name: Kevin Pearson MRN: [id number] Date of Birth: 07-13-1950 Referring Provider:   Flowsheet Row Cardiac Rehab from 08/03/2016 in [facility] Cardiac and Pulmonary Rehab  Referring Provider  Peggye Form MD      Encounter Date: 08/20/2016  Check In:     Session Check In - 08/20/16 0905      Check-In   Location [facility]-Cardiac & Pulmonary Rehab   Staff Present Alberteen Sam, MA, ACSM RCEP, Exercise Physiologist;Patricia Surles RN Vickki Hearing, BA, ACSM CEP, Exercise Physiologist   Supervising physician immediately available to respond to emergencies See telemetry face sheet for immediately available ER MD   Medication changes reported     No   Fall or balance concerns reported    No   Warm-up and Cool-down Performed on first and last piece of equipment   Resistance Training Performed Yes   VAD Patient? No     Pain Assessment   Currently in Pain? No/denies         Goals Met:  Independence with exercise equipment Exercise tolerated well No report of cardiac concerns or symptoms Strength training completed today  Goals Unmet:  Not Applicable  Comments: Pt able to follow exercise prescription today without complaint.  Will continue to monitor for progression.    Dr. Emily Filbert is Medical Director for [name] and [name].

## 2016-08-20 NOTE — Telephone Encounter (Signed)
Pt daughter, Kevin Pearson, called stating she has been doing CIC on pt since surgery. Kevin Pearson stated that up until this week CIC has been going great. Kevin Pearson stated this week she has been meeting resistance around the prostate/bladder area. Kevin Pearson stated that she has been having to actually put some pressure on the catheter when getting past "restricted" area. Cara denied blood in urine when getting urine return. Please advise.

## 2016-08-20 NOTE — Telephone Encounter (Signed)
Unfortunately, I think we just have to stay the course. It is likely that he is developing recurrent scar tissue at his bladder neck. As long as she is able to get the catheter in and successfully get urine, I would continue to do this daily.  Vanna ScotlandAshley Asher Babilonia, MD

## 2016-08-21 NOTE — Telephone Encounter (Signed)
Spoke with pt daughter, Charlynne PanderCara, in reference to pt and surgery. Cara voiced understanding.

## 2016-08-21 NOTE — Telephone Encounter (Signed)
Unfortunately, he continues to be high risk candidate for surgery and thus a poor surgical candidate.     I would like him to keep working with cardiac rehab and reevaluate his cardiac status in 10/2016 as planned unless they are no longer able to get the catheter in.     Vanna ScotlandAshley Markasia Carrol, MD

## 2016-08-25 ENCOUNTER — Encounter: Payer: BLUE CROSS/BLUE SHIELD | Admitting: Respiratory Therapy

## 2016-08-25 DIAGNOSIS — I5022 Chronic systolic (congestive) heart failure: Secondary | ICD-10-CM

## 2016-08-25 NOTE — Progress Notes (Signed)
Daily Session Note  Patient Details  Name: [name] MRN: [id number] Date of Birth: [birth year] Referring Provider:   Flowsheet Row Cardiac Rehab from 08/03/2016 in [facility] Cardiac and Pulmonary Rehab  Referring Provider  Peggye Form MD      Encounter Date: 08/25/2016  Check In:     Session Check In - 08/25/16 0855      Check-In   Location [facility]-Cardiac & Pulmonary Rehab   Staff Present Heath Lark, RN, BSN, CCRP;Laureen Owens Shark, BS, RRT, Respiratory Therapist;Carroll Enterkin, RN, BSN   Supervising physician immediately available to respond to emergencies See telemetry face sheet for immediately available ER MD   Medication changes reported     No   Fall or balance concerns reported    No   Warm-up and Cool-down Performed on first and last piece of equipment   Resistance Training Performed Yes   VAD Patient? No     Pain Assessment   Currently in Pain? No/denies   Multiple Pain Sites No         Goals Met:  Exercise tolerated well No report of cardiac concerns or symptoms Strength training completed today  Goals Unmet:  Not Applicable  Comments: Doing well with exercise prescription progression. Weight up today   See ITP Comments   Dr. Emily Filbert is Medical Director for [place] and [name].

## 2016-08-27 ENCOUNTER — Encounter: Payer: BLUE CROSS/BLUE SHIELD | Admitting: *Deleted

## 2016-08-27 ENCOUNTER — Encounter: Payer: Self-pay | Admitting: Dietician

## 2016-08-27 DIAGNOSIS — M545 Low back pain, unspecified: Secondary | ICD-10-CM | POA: Insufficient documentation

## 2016-08-27 DIAGNOSIS — I5022 Chronic systolic (congestive) heart failure: Secondary | ICD-10-CM

## 2016-08-27 DIAGNOSIS — G8929 Other chronic pain: Secondary | ICD-10-CM | POA: Insufficient documentation

## 2016-08-27 NOTE — Progress Notes (Signed)
Daily Session Note  Patient Details  Name: Kolden Dupee MRN: [id number] Date of Birth: 03/21/1951 Referring Provider:   Flowsheet Row Cardiac Rehab from 08/03/2016 in [facility] Cardiac and Pulmonary Rehab  Referring Provider  Peggye Form MD      Encounter Date: 08/27/2016  Check In:     Session Check In - 08/27/16 1442      Check-In   Location [facility]-Cardiac & Pulmonary Rehab   Staff Present Alberteen Sam, MA, ACSM RCEP, Exercise Physiologist;Other;Amanda Oletta Darter, BA, ACSM CEP, Exercise Physiologist  Darel Hong, RN   Supervising physician immediately available to respond to emergencies See telemetry face sheet for immediately available ER MD   Medication changes reported     No   Fall or balance concerns reported    No   Warm-up and Cool-down Performed on first and last piece of equipment   Resistance Training Performed Yes   VAD Patient? No     Pain Assessment   Currently in Pain? No/denies   Multiple Pain Sites No         Goals Met:  Independence with exercise equipment Exercise tolerated well Personal goals reviewed No report of cardiac concerns or symptoms Strength training completed today  Goals Unmet:  Not Applicable  Comments: Pt able to follow exercise prescription today without complaint.  Will continue to monitor for progression.    Dr. Emily Filbert is Medical Director for [place] and [name].

## 2016-09-01 ENCOUNTER — Ambulatory Visit (INDEPENDENT_AMBULATORY_CARE_PROVIDER_SITE_OTHER): Payer: BLUE CROSS/BLUE SHIELD | Admitting: Urology

## 2016-09-01 ENCOUNTER — Encounter: Payer: Self-pay | Admitting: Urology

## 2016-09-01 ENCOUNTER — Encounter: Payer: BLUE CROSS/BLUE SHIELD | Admitting: Respiratory Therapy

## 2016-09-01 VITALS — BP 117/70 | HR 76 | Ht 68.0 in | Wt 259.0 lb

## 2016-09-01 DIAGNOSIS — N529 Male erectile dysfunction, unspecified: Secondary | ICD-10-CM

## 2016-09-01 DIAGNOSIS — N320 Bladder-neck obstruction: Secondary | ICD-10-CM | POA: Diagnosis not present

## 2016-09-01 DIAGNOSIS — R3989 Other symptoms and signs involving the genitourinary system: Secondary | ICD-10-CM

## 2016-09-01 DIAGNOSIS — I5022 Chronic systolic (congestive) heart failure: Secondary | ICD-10-CM

## 2016-09-01 NOTE — Progress Notes (Signed)
09/01/2016 8:43 AM   Ronnald Collum Apr 14, 1951 [id number]    HPI: 66 yo M with ischemic cardiomyopathy (EF 35%), CAD s/p PCI/ stent/ ICD, MI x multiple, afib on anticoagulation/ antiplatelet therapy with severe bladder neck contracture s/p emergent urethral dilation under MAC  in the setting of urinary retention on 06/25/16.  Review of medical records indicate that he had an in office laser vaporization of the prostate on 06/26/2015 by Dr. Fanny Skates.  It is unclear from the note what sort of laser was used, settings, etc.  He then developed a significant bladder neck contracture with plans for surgical management of this but unable to proceed secondary to local comorbidities and cardiac status.  Since his dilation, he's been managing his bladder with daily self catheter of a 66 French Foley catheter facilitated by his daughter. He did have some issues for several days after changing catheter types with introducing the catheter, but this has improved significantly and no no pain or difficulty with this.  He does occasionally have some interpersonal issues with his daughter regarding catheterizations.  He does not feel that he would be able to do this himself.  Today, he has no significant urinary complaints. No urinary frequency, urgency, or incontinence. His stream is good caliber. He gets up once at night to void. He is unsure whether he is taking Flomax or Ditropan at this point in time. He is quite pleased.  He is also been following up with his cardiologist and is currently in cardiac rehabilitation. He has not had a cardiac event since moving from [place].   He does mention today that he has a long-standing history of erectile dysfunction.   PMH: Past Medical History:  Diagnosis Date  . Acid reflux   . Anxiety   . Arthritis   . Atrial fibrillation ([place])   . CHF (congestive heart failure) ([place])   . Chronic orthostatic hypotension   . Clotting disorder ([place])   . COPD  (chronic obstructive pulmonary disease) ([place])   . Depression   . Elevated PSA   . Heart attack   . Heart disease   . Heart failure ([place])   . Hepatitis   . High cholesterol   . Hypertension   . Sleep apnea   . Urinary retention     Surgical History: Past Surgical History:  Procedure Laterality Date  . BLADDER SURGERY    . cardiac stents    . CYSTOSCOPY WITH URETHRAL DILATATION N/A 06/25/2016   Procedure: CYSTOSCOPY WITH URETHRAL DILATATION;  Surgeon: Hollice Espy, MD;  Location: [facility] ORS;  Service: Urology;  Laterality: N/A;  . EP IMPLANTABLE DEVICE     St. Jude BiV-ICD  . GREEN LIGHT LASER TURP (TRANSURETHRAL RESECTION OF PROSTATE  2016   done in [facility]     Home Medications:  Allergies as of 09/01/2016      Reactions   Codeine Nausea And Vomiting   Other Itching   Mango skin      Medication List       Accurate as of 09/01/16 11:59 PM. Always use your most recent med list.          acetaminophen 325 MG tablet Commonly known as:  TYLENOL Take 650 mg by mouth every 6 (six) hours as needed.   albuterol 108 (90 Base) MCG/ACT inhaler Commonly known as:  PROVENTIL HFA;VENTOLIN HFA Inhale 2 puffs into the lungs every 4 (four) hours as needed for shortness of breath.   carvedilol 6.25 MG tablet Commonly  known as:  COREG Take 6.25 mg by mouth 2 (two) times daily.   clonazePAM 1 MG tablet Commonly known as:  KLONOPIN Take 1 mg by mouth 3 (three) times daily.   clopidogrel 75 MG tablet Commonly known as:  PLAVIX Take 75 mg by mouth daily.   dicyclomine 10 MG capsule Commonly known as:  BENTYL Take 1 capsule (10 mg total) by mouth 3 (three) times daily as needed for spasms.   divalproex 500 MG 24 hr tablet Commonly known as:  DEPAKOTE ER Take by mouth 2 (two) times daily.   furosemide 40 MG tablet Commonly known as:  LASIX Take 40 mg by mouth 2 (two) times daily.   gabapentin 300 MG capsule Commonly known as:  NEURONTIN Take 300 mg by mouth 3 (three) times  daily.   hydrALAZINE 25 MG tablet Commonly known as:  APRESOLINE Take 25 mg by mouth 3 (three) times daily.   Hyoscyamine Sulfate SL 0.125 MG Subl Commonly known as:  LEVSIN/SL Place 1 tablet under the tongue daily.   ipratropium-albuterol 0.5-2.5 (3) MG/3ML Soln Commonly known as:  DUONEB Take 3 mLs by nebulization every 6 (six) hours as needed (wheezing/shortness of breath).   losartan 100 MG tablet Commonly known as:  COZAAR Take 100 mg by mouth daily.   Melatonin 5 MG Tabs Take 1 tablet by mouth at bedtime.   nitroGLYCERIN 0.4 MG SL tablet Commonly known as:  NITROSTAT Place 0.4 mg under the tongue every 5 (five) minutes as needed for chest pain.   omeprazole 40 MG capsule Commonly known as:  PRILOSEC Take 40 mg by mouth daily.   potassium chloride SA 20 MEQ tablet Commonly known as:  K-DUR,KLOR-CON Take 20 mEq by mouth 2 (two) times daily.   rosuvastatin 20 MG tablet Commonly known as:  CRESTOR Take 20 mg by mouth daily.   tamsulosin 0.4 MG Caps capsule Commonly known as:  FLOMAX TAKE ONE CAPSULE BY MOUTH EVERY DAY AFTER SUPPER   traMADol 50 MG tablet Commonly known as:  ULTRAM Take 50 mg by mouth every 6 (six) hours as needed for moderate pain or severe pain.   XARELTO 20 MG Tabs tablet Generic drug:  rivaroxaban Take 20 mg by mouth at bedtime.       Allergies:  Allergies  Allergen Reactions  . Codeine Nausea And Vomiting  . Other Itching    Mango skin    Family History: Family History  Problem Relation Age of Onset  . Other Father     Cerebral hemorrhage  . Kidney cancer Neg Hx   . Kidney disease Neg Hx   . Prostate cancer Neg Hx     Social History:  reports that he quit smoking about 12 months ago. His smoking use included Cigarettes. He has never used smokeless tobacco. He reports that he drinks alcohol. He reports that he does not use drugs.  ROS: UROLOGY Frequent Urination?: No Hard to postpone urination?: No Burning/pain with  urination?: No Get up at night to urinate?: Yes Leakage of urine?: No Urine stream starts and stops?: No Trouble starting stream?: No Do you have to strain to urinate?: No Blood in urine?: No Urinary tract infection?: No Sexually transmitted disease?: No Injury to kidneys or bladder?: No Painful intercourse?: No Weak stream?: No Erection problems?: Yes Penile pain?: No  Gastrointestinal Nausea?: No Vomiting?: No Indigestion/heartburn?: No Diarrhea?: No Constipation?: No  Constitutional Fever: No Night sweats?: No Weight loss?: No Fatigue?: No  Skin Skin rash/lesions?: No  Itching?: No  Eyes Blurred vision?: No Double vision?: No  Ears/Nose/Throat Sore throat?: No Sinus problems?: No  Hematologic/Lymphatic Swollen glands?: No Easy bruising?: No  Cardiovascular Leg swelling?: Yes Chest pain?: No  Respiratory Cough?: No Shortness of breath?: Yes  Endocrine Excessive thirst?: No  Musculoskeletal Back pain?: Yes Joint pain?: No  Neurological Headaches?: No Dizziness?: No  Psychologic Depression?: No Anxiety?: Yes  Physical Exam: BP 117/70   Pulse 76   Ht _0  (1.727 m)   Wt 259 lb (117.5 kg)   BMI 39.38 kg/m   Constitutional:  Alert and oriented, No acute distress.  Accompanied by daughter today.  HEENT: [place] AT, moist mucus membranes.  Trachea midline, no masses. Cardiovascular: Bilateral lower extremity edema, chronic with venous stasis changes Respiratory: Normal respiratory effort, no increased work of breathing. GI: Abdomen is soft, nontender, nondistended, no abdominal masses, obese..   GU: No CVA tenderness. Neurologic: Grossly intact, no focal deficits, moving all 4 extremities. Psychiatric: Normal mood and affect.  Laboratory Data: Lab Results  Component Value Date   WBC 9.3 07/27/2016   HGB 10.8 (L) 07/27/2016   HCT 31.7 (L) 07/27/2016   MCV 82.4 07/27/2016   PLT 224 07/27/2016    Lab Results  Component Value Date    CREATININE 1.25 (H) 07/27/2016    Urinalysis N/a  Pertinent Imaging: n/a  Assessment & Plan:   1. Bladder neck contracture Severe pinpoint bladder neck contracture likely secondary to history of laser ablation of the prostate in 06/2016 Status post dilation under monitored anesthesia care in 06/2016 in the setting of retention and severe urinary symptoms Currently managed with daily CIC to keep his bladder neck patent, no urinary complaints otherwise  Discussion today regarding how to move forward. Patient does not want to continue to self catheter daily, however, seems to be doing well with this. He continues to be high risk for the operating room.  Options moving forward include continuation of current management, backing off frequency of clean intermittent catheterization over time to see if his bladder neck contracture recurs, or more definitive laser ablation of his bladder neck.  At this point in time, given his cardiac comorbidities, I'm very hesitant to proceed with any surgical intervention since he is doing well. Ultimately, he is agreeable with this plan. He will continue to self catheter daily and call us if there is any issues.    2. Urethral pain Resolved  3. Erectile dysfunction Likely secondary to cardiovascular comorbidities We will need cardiac clearance for sexual activity/ treatment We will discuss further at next visit  Return in about 3 months (around 11/29/2016) for symptoms recheck.  Hollice Espy, MD  [facility] [address], [place], [place] [address] [phone]

## 2016-09-01 NOTE — Progress Notes (Signed)
Daily Session Note  Patient Details  Name: Archie Shea MRN: [id number] Date of Birth: 28-Oct-1950 Referring Provider:   Flowsheet Row Cardiac Rehab from 08/03/2016 in [facility] Cardiac and Pulmonary Rehab  Referring Provider  Peggye Form MD      Encounter Date: 09/01/2016  Check In:     Session Check In - 09/01/16 0835      Check-In   Location [facility]-Cardiac & Pulmonary Rehab   Staff Present Alberteen Sam, MA, ACSM RCEP, Exercise Physiologist;Susanne Bice, RN, BSN, CCRP;Laureen Owens Shark, BS, RRT, Respiratory Therapist   Supervising physician immediately available to respond to emergencies See telemetry face sheet for immediately available ER MD   Medication changes reported     No   Fall or balance concerns reported    No   Warm-up and Cool-down Performed on first and last piece of equipment   Resistance Training Performed Yes   VAD Patient? No     Pain Assessment   Currently in Pain? No/denies   Multiple Pain Sites No         History  Smoking Status   Former Smoker   Types: Cigarettes   Quit date: 08/2015  Smokeless Tobacco   Never Used    Goals Met:  Proper associated with RPD/PD & O2 Sat Independence with exercise equipment Exercise tolerated well No report of cardiac concerns or symptoms Strength training completed today  Goals Unmet:  Not Applicable  Comments: Pt able to follow exercise prescription today without complaint.  Will continue to monitor for progression.   Dr. Emily Filbert is Medical Director for [place] [facility] and [name].

## 2016-09-02 ENCOUNTER — Encounter: Payer: Self-pay | Admitting: *Deleted

## 2016-09-02 DIAGNOSIS — I5022 Chronic systolic (congestive) heart failure: Secondary | ICD-10-CM

## 2016-09-02 NOTE — Progress Notes (Signed)
Cardiac Individual Treatment Plan  Patient Details  Name: Kevin Pearson MRN: [id number] Date of Birth: May 30, 1951 Referring Provider:   Flowsheet Row Cardiac Rehab from 08/03/2016 in [facility] Cardiac and Pulmonary Rehab  Referring Provider  Peggye Form MD      Initial Encounter Date:  Flowsheet Row Cardiac Rehab from 08/03/2016 in [facility] Cardiac and Pulmonary Rehab  Date  08/03/16  Referring Provider  Peggye Form MD      Visit Diagnosis: Heart failure, chronic systolic ([place])  Patient's Home Medications on Admission:  Current Outpatient Prescriptions:  .  acetaminophen (TYLENOL) 325 MG tablet, Take 650 mg by mouth every 6 (six) hours as needed., Disp: , Rfl:  .  albuterol (PROVENTIL HFA;VENTOLIN HFA) 108 (90 Base) MCG/ACT inhaler, Inhale 2 puffs into the lungs every 4 (four) hours as needed for shortness of breath., Disp: , Rfl:  .  carvedilol (COREG) 6.25 MG tablet, Take 6.25 mg by mouth 2 (two) times daily., Disp: , Rfl:  .  clonazePAM (KLONOPIN) 1 MG tablet, Take 1 mg by mouth 3 (three) times daily., Disp: , Rfl:  .  clopidogrel (PLAVIX) 75 MG tablet, Take 75 mg by mouth daily., Disp: , Rfl:  .  dicyclomine (BENTYL) 10 MG capsule, Take 1 capsule (10 mg total) by mouth 3 (three) times daily as needed for spasms., Disp: 20 capsule, Rfl: 0 .  divalproex (DEPAKOTE ER) 500 MG 24 hr tablet, Take by mouth 2 (two) times daily., Disp: , Rfl:  .  furosemide (LASIX) 40 MG tablet, Take 40 mg by mouth 2 (two) times daily., Disp: , Rfl:  .  gabapentin (NEURONTIN) 300 MG capsule, Take 300 mg by mouth 3 (three) times daily., Disp: , Rfl:  .  hydrALAZINE (APRESOLINE) 25 MG tablet, Take 25 mg by mouth 3 (three) times daily., Disp: , Rfl:  .  Hyoscyamine Sulfate SL (LEVSIN/SL) 0.125 MG SUBL, Place 1 tablet under the tongue daily., Disp: 30 each, Rfl: 0 .  ipratropium-albuterol (DUONEB) 0.5-2.5 (3) MG/3ML SOLN, Take 3 mLs by nebulization every 6 (six) hours as needed (wheezing/shortness of  breath)., Disp: , Rfl:  .  losartan (COZAAR) 100 MG tablet, Take 100 mg by mouth daily., Disp: , Rfl:  .  Melatonin 5 MG TABS, Take 1 tablet by mouth at bedtime., Disp: , Rfl:  .  nitroGLYCERIN (NITROSTAT) 0.4 MG SL tablet, Place 0.4 mg under the tongue every 5 (five) minutes as needed for chest pain., Disp: , Rfl:  .  omeprazole (PRILOSEC) 40 MG capsule, Take 40 mg by mouth daily., Disp: , Rfl:  .  potassium chloride SA (K-DUR,KLOR-CON) 20 MEQ tablet, Take 20 mEq by mouth 2 (two) times daily., Disp: , Rfl:  .  rivaroxaban (XARELTO) 20 MG TABS tablet, Take 20 mg by mouth at bedtime., Disp: , Rfl:  .  rosuvastatin (CRESTOR) 20 MG tablet, Take 20 mg by mouth daily., Disp: , Rfl:  .  tamsulosin (FLOMAX) 0.4 MG CAPS capsule, TAKE ONE CAPSULE BY MOUTH EVERY DAY AFTER SUPPER, Disp: 30 capsule, Rfl: 3 .  traMADol (ULTRAM) 50 MG tablet, Take 50 mg by mouth every 6 (six) hours as needed for moderate pain or severe pain. , Disp: , Rfl:   Past Medical History: Past Medical History:  Diagnosis Date  . Acid reflux   . Anxiety   . Arthritis   . Atrial fibrillation ([place])   . CHF (congestive heart failure) ([place])   . Chronic orthostatic hypotension   . Clotting disorder ([place])   .  COPD (chronic obstructive pulmonary disease) ([place])   . Depression   . Elevated PSA   . Heart attack   . Heart disease   . Heart failure ([place])   . Hepatitis   . High cholesterol   . Hypertension   . Sleep apnea   . Urinary retention     Tobacco Use: History  Smoking Status  . Former Smoker  . Types: Cigarettes  . Quit date: 08/2015  Smokeless Tobacco  . Never Used    Labs: Recent Review Flowsheet Data    There is no flowsheet data to display.       Exercise Target Goals:    Exercise Program Goal: Individual exercise prescription set with THRR, safety & activity barriers. Participant demonstrates ability to understand and report RPE using BORG scale, to self-measure pulse accurately, and to acknowledge  the importance of the exercise prescription.  Exercise Prescription Goal: Starting with aerobic activity 30 plus minutes a day, 3 days per week for initial exercise prescription. Provide home exercise prescription and guidelines that participant acknowledges understanding prior to discharge.  Activity Barriers & Risk Stratification:     Activity Barriers & Cardiac Risk Stratification - 08/03/16 1309      Activity Barriers & Cardiac Risk Stratification   Activity Barriers Muscular Weakness;Shortness of Breath   Cardiac Risk Stratification High      6 Minute Walk:     6 Minute Walk    Row Name 08/03/16 1533         6 Minute Walk   Phase Initial     Distance 806 feet     Walk Time 5.5 minutes     # of Rest Breaks 1  30 sec     MPH 1.66     METS 2.23     RPE 16     Perceived Dyspnea  3     VO2 Peak 7.08     Symptoms Yes (comment)     Comments SOB     Resting HR 69 bpm     Resting BP 148/72     Max Ex. HR 119 bpm     Max Ex. BP 156/80     2 Minute Post BP 126/70       Interval HR   Baseline HR 69     1 Minute HR 79     2 Minute HR 80     3 Minute HR 82     4 Minute HR 109     5 Minute HR 98     6 Minute HR 97     2 Minute Post HR 84     Interval Heart Rate? Yes       Interval Oxygen   Interval Oxygen? Yes     Baseline Oxygen Saturation % 100 %     Baseline Liters of Oxygen 2 L     1 Minute Oxygen Saturation % 97 %     1 Minute Liters of Oxygen 2 L     2 Minute Oxygen Saturation % 96 %  rest break 2:45-3:15     2 Minute Liters of Oxygen 2 L     3 Minute Oxygen Saturation % 100 %     3 Minute Liters of Oxygen 2 L     4 Minute Oxygen Saturation % 97 %     4 Minute Liters of Oxygen 2 L     5 Minute Oxygen Saturation % 98 %     5  Minute Liters of Oxygen 2 L     6 Minute Oxygen Saturation % 96 %     6 Minute Liters of Oxygen 2 L     2 Minute Post Oxygen Saturation % 100 %     2 Minute Post Liters of Oxygen 2 L        Oxygen Initial  Assessment:   Oxygen Re-Evaluation:   Oxygen Discharge (Final Oxygen Re-Evaluation):   Initial Exercise Prescription:     Initial Exercise Prescription - 08/03/16 1500      Date of Initial Exercise RX and Referring Provider   Date 08/03/16   Referring Provider Peggye Form MD     Oxygen   Oxygen Continuous   Liters 2     Treadmill   MPH 1.3   Grade 0   Minutes 15   METs 2     NuStep   Level 1   Minutes 15   METs 2     Biostep-RELP   Level 2   Minutes 15   METs 2     Prescription Details   Frequency (times per week) 2   Duration Progress to 45 minutes of aerobic exercise without signs/symptoms of physical distress     Intensity   THRR 40-80% of Max Heartrate 104-139   Ratings of Perceived Exertion 11-15   Perceived Dyspnea 0-4     Progression   Progression Continue to progress workloads to maintain intensity without signs/symptoms of physical distress.     Resistance Training   Training Prescription Yes   Weight 3 lbs   Reps 10-15      Perform Capillary Blood Glucose checks as needed.  Exercise Prescription Changes:     Exercise Prescription Changes    Row Name 08/03/16 1400 08/12/16 1400 08/25/16 1600         Response to Exercise   Blood Pressure (Admit) 148/72 106/70 130/72     Blood Pressure (Exercise) 156/80 130/80 134/70     Blood Pressure (Exit) 126/70 126/70 100/62     Heart Rate (Admit) 69 bpm 69 bpm 68 bpm     Heart Rate (Exercise) 119 bpm 98 bpm 95 bpm     Heart Rate (Exit) 84 bpm 53 bpm 70 bpm     Oxygen Saturation (Admit) 100 %  -  -     Oxygen Saturation (Exercise) 96 %  -  -     Rating of Perceived Exertion (Exercise) '16 9 11     ' Perceived Dyspnea (Exercise) 3  -  -     Symptoms SOB none none     Duration  - Progress to 45 minutes of aerobic exercise without signs/symptoms of physical distress Progress to 45 minutes of aerobic exercise without signs/symptoms of physical distress     Intensity  - THRR unchanged THRR  unchanged       Progression   Progression  - Continue to progress workloads to maintain intensity without signs/symptoms of physical distress. Continue to progress workloads to maintain intensity without signs/symptoms of physical distress.     Average METs  - 1.35 2.23       Resistance Training   Training Prescription  - Yes Yes     Weight  - 3 lbs 3 lbs     Reps  - 10-15 10-15       Interval Training   Interval Training  - No No       Oxygen   Oxygen  -  - Continuous  when he remembers to wear it     Liters 2  - 2       Treadmill   MPH  -  - 1.3     Grade  -  - 0     Minutes  -  - 15     METs  -  - 2       NuStep   Level  - 1 4     Minutes  - 15 15     METs  - 1.7 2.4       Biostep-RELP   Level  - 2 2     Minutes  - 15 15     METs  - 1 2       Exercise Review   Progression -  walk test results -  First Full Day of Exercise Yes        Exercise Comments:     Exercise Comments    Row Name 08/11/16 1122 08/25/16 1618         Exercise Comments First full day of exercise!  Patient was oriented to gym and equipment including functions, settings, policies, and procedures.  Patient's individual exercise prescription and treatment plan were reviewed.  All starting workloads were established based on the results of the 6 minute walk test done at initial orientation visit.  The plan for exercise progression was also introduced and progression will be customized based on patient's performance and goals. Pheng is off to a good start with his rehab.  He is able to get from piece to piece without as much help now.  He is also up to level 4 on the NuStep. We will continue to monitor his progression.         Exercise Goals and Review:   Exercise Goals Re-Evaluation :   Discharge Exercise Prescription (Final Exercise Prescription Changes):     Exercise Prescription Changes - 08/25/16 1600      Response to Exercise   Blood Pressure (Admit) 130/72   Blood Pressure  (Exercise) 134/70   Blood Pressure (Exit) 100/62   Heart Rate (Admit) 68 bpm   Heart Rate (Exercise) 95 bpm   Heart Rate (Exit) 70 bpm   Rating of Perceived Exertion (Exercise) 11   Symptoms none   Duration Progress to 45 minutes of aerobic exercise without signs/symptoms of physical distress   Intensity THRR unchanged     Progression   Progression Continue to progress workloads to maintain intensity without signs/symptoms of physical distress.   Average METs 2.23     Resistance Training   Training Prescription Yes   Weight 3 lbs   Reps 10-15     Interval Training   Interval Training No     Oxygen   Oxygen Continuous  when he remembers to wear it   Liters 2     Treadmill   MPH 1.3   Grade 0   Minutes 15   METs 2     NuStep   Level 4   Minutes 15   METs 2.4     Biostep-RELP   Level 2   Minutes 15   METs 2     Exercise Review   Progression Yes      Nutrition:  Target Goals: Understanding of nutrition guidelines, daily intake of sodium <1527m, cholesterol <2029m calories 30% from fat and 7% or less from saturated fats, daily to have 5 or more servings of fruits and vegetables.  Biometrics:  Pre Biometrics - 08/03/16 1540      Pre Biometrics   Height 5' 7.9" (1.725 m)   Weight 264 lb 3.2 oz (119.8 kg)   Waist Circumference 47 inches   Hip Circumference 47 inches   Waist to Hip Ratio 1 %   BMI (Calculated) 40.4   Single Leg Stand 1.89 seconds       Nutrition Therapy Plan and Nutrition Goals:     Nutrition Therapy & Goals - 08/27/16 1139      Nutrition Therapy   Diet basic heart healthy   Drug/Food Interactions Statins/Certain Fruits   Protein (specify units) 8-9oz   Fiber 30 grams   Whole Grain Foods 3 servings   Saturated Fats 14 max. grams   Fruits and Vegetables 8 servings/day   Sodium 1500 grams     Personal Nutrition Goals   Nutrition Goal Continue to increase vegetable and fruit intake, as well as whole grains, nuts, beans,  fish.      Intervention Plan   Intervention Prescribe, educate and counsel regarding individualized specific dietary modifications aiming towards targeted core components such as weight, hypertension, lipid management, diabetes, heart failure and other comorbidities.;Nutrition handout(s) given to patient.   Expected Outcomes Short Term Goal: Understand basic principles of dietary content, such as calories, fat, sodium, cholesterol and nutrients.;Short Term Goal: A plan has been developed with personal nutrition goals set during dietitian appointment.;Long Term Goal: Adherence to prescribed nutrition plan.      Nutrition Discharge: Rate Your Plate Scores:     Nutrition Assessments - 08/03/16 1254      MEDFICTS Scores   Pre Score 60      Nutrition Goals Re-Evaluation:   Nutrition Goals Discharge (Final Nutrition Goals Re-Evaluation):   Psychosocial: Target Goals: Acknowledge presence or absence of significant depression and/or stress, maximize coping skills, provide positive support system. Participant is able to verbalize types and ability to use techniques and skills needed for reducing stress and depression.   Initial Review & Psychosocial Screening:     Initial Psych Review & Screening - 08/03/16 1312      Initial Review   Comments Has chronic urological problem that requires surgical intervention. On hold until at least March.       Quality of Life Scores:      Quality of Life - 08/03/16 1303      Quality of Life Scores   Health/Function Pre 8.17 %   Socioeconomic Pre 8.43 %   Psych/Spiritual Pre 10.14 %   Family Pre 12.7 %   GLOBAL Pre 9.29 %      PHQ-9: Recent Review Flowsheet Data    Depression screen [facility] 2/9 08/03/2016 08/03/2016   Decreased Interest - 1   Down, Depressed, Hopeless - 1   PHQ - 2 Score - 2   Altered sleeping - 1   Tired, decreased energy - 1   Change in appetite - 1   Feeling bad or failure about yourself  - 2   Trouble concentrating -  1   Moving slowly or fidgety/restless - 2   Suicidal thoughts (No Data)  0   PHQ-9 Score - 10   Difficult doing work/chores - Somewhat difficult     Interpretation of Total Score  Total Score Depression Severity:  1-4 = Minimal depression, 5-9 = Mild depression, 10-14 = Moderate depression, 15-19 = Moderately severe depression, 20-27 = Severe depression   Psychosocial Evaluation and Intervention:     Psychosocial Evaluation - 08/11/16 6568  Psychosocial Evaluation & Interventions   Interventions Encouraged to exercise with the program and follow exercise prescription;Stress management education   Comments Counselor met with Mr. Damiani Arguijo) today for initial psychosocial evaluation.  He is a 66 year old who has quite a history of cardiac health issues with Congestive heart Failure and a pacemaker and defibrolator.  He also has COPD and some urological issues that are currently under treatment.  Mr. Trickett divorced 10 years ago after 16 years of marriage.  He was living in [place] until several months ago and his health had declined to such a degree that he moved here with his daughter and son-in-law.  His daughter is an Therapist, sports.  Mr. Skora states he sleeps well and has a good appetite.  He admits to a history of depression and anxiety and is on medications for current symptoms, which he says may need more treatment - so he mentioned the need for a psychiatrist locally to help with this.  He states his mood is generally stable but he has had multiple stressors with the recent move and health issues.  His goals are to "get back on track" so he can move forward with the urological surgery in the next month or so.  Counselor will provide Mr. Nester with a list of local psychiatrists to help with his current mood and to manage his medications.     Continue Psychosocial Services  --  Counselor provided Psychiatrists names and numbers and will follow with Mr. Blais on his mood and stress.  He will  benefit from the psychoeducational components of this program.        Psychosocial Re-Evaluation:     Psychosocial Re-Evaluation    Row Name 08/20/16 [phone]             Psychosocial Re-Evaluation   Comments Counselor follow up with Mr. Alspaugh stating he is enjoying this program and feels it is beneficial overall.  He has not contacted a psychiatrist yet to help with his mental health needs here locally.  Counselor provided him with a great local resource that he agreed to contact.  She has appointments available within the next 3 weeks.  Counselor and Cardiac Rehab staff will continue to follow with Mr. Sigl.            Psychosocial Discharge (Final Psychosocial Re-Evaluation):     Psychosocial Re-Evaluation - 08/20/16 0943      Psychosocial Re-Evaluation   Comments Counselor follow up with Mr. Hale stating he is enjoying this program and feels it is beneficial overall.  He has not contacted a psychiatrist yet to help with his mental health needs here locally.  Counselor provided him with a great local resource that he agreed to contact.  She has appointments available within the next 3 weeks.  Counselor and Cardiac Rehab staff will continue to follow with Mr. Zebrowski.        Vocational Rehabilitation: Provide vocational rehab assistance to qualifying candidates.   Vocational Rehab Evaluation & Intervention:     Vocational Rehab - 08/03/16 1311      Initial Vocational Rehab Evaluation & Intervention   Assessment shows need for Vocational Rehabilitation No      Education: Education Goals: Education classes will be provided on a weekly basis, covering required topics. Participant will state understanding/return demonstration of topics presented.  Learning Barriers/Preferences:     Learning Barriers/Preferences - 08/03/16 1310      Learning Barriers/Preferences   Learning Barriers Hearing   Learning  Preferences Skilled Demonstration      Education Topics: General  Nutrition Guidelines/Fats and Fiber: -Group instruction provided by verbal, written material, models and posters to present the general guidelines for heart healthy nutrition. Gives an explanation and review of dietary fats and fiber.   Controlling Sodium/Reading Food Labels: -Group verbal and written material supporting the discussion of sodium use in heart healthy nutrition. Review and explanation with models, verbal and written materials for utilization of the food label.   Exercise Physiology & Risk Factors: - Group verbal and written instruction with models to review the exercise physiology of the cardiovascular system and associated critical values. Details cardiovascular disease risk factors and the goals associated with each risk factor.   Aerobic Exercise & Resistance Training: - Gives group verbal and written discussion on the health impact of inactivity. On the components of aerobic and resistive training programs and the benefits of this training and how to safely progress through these programs.   Flexibility, Balance, General Exercise Guidelines: - Provides group verbal and written instruction on the benefits of flexibility and balance training programs. Provides general exercise guidelines with specific guidelines to those with heart or lung disease. Demonstration and skill practice provided.   Stress Management: - Provides group verbal and written instruction about the health risks of elevated stress, cause of high stress, and healthy ways to reduce stress. Flowsheet Row Cardiac Rehab from 09/01/2016 in [facility] Cardiac and Pulmonary Rehab  Date  08/13/16  Educator  TS  Instruction Review Code  2- meets goals/outcomes      Depression: - Provides group verbal and written instruction on the correlation between heart/lung disease and depressed mood, treatment options, and the stigmas associated with seeking treatment.   Anatomy & Physiology of the Heart: - Group verbal and  written instruction and models provide basic cardiac anatomy and physiology, with the coronary electrical and arterial systems. Review of: AMI, Angina, Valve disease, Heart Failure, Cardiac Arrhythmia, Pacemakers, and the ICD. Flowsheet Row Cardiac Rehab from 09/01/2016 in [facility] Cardiac and Pulmonary Rehab  Date  08/11/16  Educator  CE  Instruction Review Code  2- meets goals/outcomes      Cardiac Procedures: - Group verbal and written instruction and models to describe the testing methods done to diagnose heart disease. Reviews the outcomes of the test results. Describes the treatment choices: Medical Management, Angioplasty, or Coronary Bypass Surgery. Flowsheet Row Cardiac Rehab from 09/01/2016 in [facility] Cardiac and Pulmonary Rehab  Date  08/18/16  Educator  CE  Instruction Review Code  2- meets goals/outcomes      Cardiac Medications: - Group verbal and written instruction to review commonly prescribed medications for heart disease. Reviews the medication, class of the drug, and side effects. Includes the steps to properly store meds and maintain the prescription regimen. Flowsheet Row Cardiac Rehab from 09/01/2016 in [facility] Cardiac and Pulmonary Rehab  Date  08/25/16  Educator  SB  Instruction Review Code  2- meets goals/outcomes      Go Sex-Intimacy & Heart Disease, Get SMART - Goal Setting: - Group verbal and written instruction through game format to discuss heart disease and the return to sexual intimacy. Provides group verbal and written material to discuss and apply goal setting through the application of the S.M.A.R.T. Method. Flowsheet Row Cardiac Rehab from 09/01/2016 in [facility] Cardiac and Pulmonary Rehab  Date  08/18/16  Educator  CE  Instruction Review Code  2- meets goals/outcomes      Other Matters of the Heart: -  Provides group verbal, written materials and models to describe Heart Failure, Angina, Valve Disease, and Diabetes in the realm of heart disease. Includes  description of the disease process and treatment options available to the cardiac patient. Flowsheet Row Cardiac Rehab from 09/01/2016 in [facility] Cardiac and Pulmonary Rehab  Date  08/11/16  Educator  CE  Instruction Review Code  2- meets goals/outcomes      Exercise & Equipment Safety: - Individual verbal instruction and demonstration of equipment use and safety with use of the equipment. Flowsheet Row Cardiac Rehab from 09/01/2016 in [facility] Cardiac and Pulmonary Rehab  Date  08/03/16  Educator  SB  Instruction Review Code  2- meets goals/outcomes      Infection Prevention: - Provides verbal and written material to individual with discussion of infection control including proper hand washing and proper equipment cleaning during exercise session. Flowsheet Row Cardiac Rehab from 09/01/2016 in [facility] Cardiac and Pulmonary Rehab  Date  08/03/16  Educator  Sb  Instruction Review Code  2- meets goals/outcomes      Falls Prevention: - Provides verbal and written material to individual with discussion of falls prevention and safety. Flowsheet Row Cardiac Rehab from 09/01/2016 in [facility] Cardiac and Pulmonary Rehab  Date  08/03/16  Educator  Sb  Instruction Review Code  2- meets goals/outcomes      Diabetes: - Individual verbal and written instruction to review signs/symptoms of diabetes, desired ranges of glucose level fasting, after meals and with exercise. Advice that pre and post exercise glucose checks will be done for 3 sessions at entry of program.    Knowledge Questionnaire Score:     Knowledge Questionnaire Score - 08/03/16 1310      Knowledge Questionnaire Score   Pre Score 25/28  responses reviewed wiht verbal response of understanding      Core Components/Risk Factors/Patient Goals at Admission:     Personal Goals and Risk Factors at Admission - 08/03/16 1311      Core Components/Risk Factors/Patient Goals on Admission    Weight Management Yes;Obesity;Weight Loss    Admit Weight 264 lb 3.2 oz (119.8 kg)   Goal Weight: Short Term 260 lb (117.9 kg)   Goal Weight: Long Term 180 lb (81.6 kg)   Expected Outcomes Short Term: Continue to assess and modify interventions until short term weight is achieved;Long Term: Adherence to nutrition and physical activity/exercise program aimed toward attainment of established weight goal;Weight Loss: Understanding of general recommendations for a balanced deficit meal plan, which promotes 1-2 lb weight loss per week and includes a negative energy balance of [id number] kcal/d;Understanding recommendations for meals to include 15-35% energy as protein, 25-35% energy from fat, 35-60% energy from carbohydrates, less than 27m of dietary cholesterol, 20-35 gm of total fiber daily;Understanding of distribution of calorie intake throughout the day with the consumption of 4-5 meals/snacks   Sedentary Yes   Intervention Provide advice, education, support and counseling about physical activity/exercise needs.;Develop an individualized exercise prescription for aerobic and resistive training based on initial evaluation findings, risk stratification, comorbidities and participant's personal goals.   Expected Outcomes Achievement of increased cardiorespiratory fitness and enhanced flexibility, muscular endurance and strength shown through measurements of functional capacity and personal statement of participant.   Increase Strength and Stamina Yes   Intervention Provide advice, education, support and counseling about physical activity/exercise needs.;Develop an individualized exercise prescription for aerobic and resistive training based on initial evaluation findings, risk stratification, comorbidities and participant's personal goals.   Expected Outcomes Achievement of  increased cardiorespiratory fitness and enhanced flexibility, muscular endurance and strength shown through measurements of functional capacity and personal statement of participant.    Improve shortness of breath with ADL's Yes  Has COPD, wears oxygen as prescribed   Intervention Provide education, individualized exercise plan and daily activity instruction to help decrease symptoms of SOB with activities of daily living.   Expected Outcomes Short Term: Achieves a reduction of symptoms when performing activities of daily living.   Heart Failure Yes   Intervention Provide a combined exercise and nutrition program that is supplemented with education, support and counseling about heart failure. Directed toward relieving symptoms such as shortness of breath, decreased exercise tolerance, and extremity edema.   Expected Outcomes Improve functional capacity of life;Short term: Attendance in program 2-3 days a week with increased exercise capacity. Reported lower sodium intake. Reported increased fruit and vegetable intake. Reports medication compliance.;Short term: Daily weights obtained and reported for increase. Utilizing diuretic protocols set by physician.;Long term: Adoption of self-care skills and reduction of barriers for early signs and symptoms recognition and intervention leading to self-care maintenance.   Hypertension Yes   Intervention Provide education on lifestyle modifcations including regular physical activity/exercise, weight management, moderate sodium restriction and increased consumption of fresh fruit, vegetables, and low fat dairy, alcohol moderation, and smoking cessation.;Monitor prescription use compliance.   Expected Outcomes Short Term: Continued assessment and intervention until BP is < 140/47m HG in hypertensive participants. < 130/844mHG in hypertensive participants with diabetes, heart failure or chronic kidney disease.;Long Term: Maintenance of blood pressure at goal levels.   Lipids Yes   Intervention Provide education and support for participant on nutrition & aerobic/resistive exercise along with prescribed medications to achieve LDL <7049mHDL >27m35m  Expected Outcomes Short Term: Participant states understanding of desired cholesterol values and is compliant with medications prescribed. Participant is following exercise prescription and nutrition guidelines.;Long Term: Cholesterol controlled with medications as prescribed, with individualized exercise RX and with personalized nutrition plan. Value goals: LDL < 70mg52mL > 40 mg.   Stress Yes   Intervention Offer individual and/or small group education and counseling on adjustment to heart disease, stress management and health-related lifestyle change. Teach and support self-help strategies.;Refer participants experiencing significant psychosocial distress to appropriate mental health specialists for further evaluation and treatment. When possible, include family members and significant others in education/counseling sessions.   Expected Outcomes Short Term: Participant demonstrates changes in health-related behavior, relaxation and other stress management skills, ability to obtain effective social support, and compliance with psychotropic medications if prescribed.;Long Term: Emotional wellbeing is indicated by absence of clinically significant psychosocial distress or social isolation.      Core Components/Risk Factors/Patient Goals Review:      Goals and Risk Factor Review    Row Name 08/27/16 1442             Core Components/Risk Factors/Patient Goals Review   Personal Goals Review Sedentary;Increase Strength and Stamina;Lipids;Hypertension;Heart Failure;Weight Management/Obesity       Review CarmeLeraymentioned that he is already noticing an improvement since beginning rehab. He has said that even his daughter has noticed the changes in him.  He not forgetting as much and moving better.  His blood pressures have been good in rehab, and no problems with his heart meds.  He weighs here daily and been under fairly good control.  He does not have a scale at home to weigh, but is going to  work with the foundation to get one.  He also wants to walk more, but needs better shoes.  He is hoping the foundation will help with this as well.       Expected Outcomes Short: Kacey will continue to come to rehab for exercise, get fitted for shoes.  He was also scheduled to meet with dietician today.  Long: Get new shoes, get scale for home use, monitor risk factors.          Core Components/Risk Factors/Patient Goals at Discharge (Final Review):      Goals and Risk Factor Review - 08/27/16 1442      Core Components/Risk Factors/Patient Goals Review   Personal Goals Review Sedentary;Increase Strength and Stamina;Lipids;Hypertension;Heart Failure;Weight Management/Obesity   Review Braelyn has mentioned that he is already noticing an improvement since beginning rehab. He has said that even his daughter has noticed the changes in him.  He not forgetting as much and moving better.  His blood pressures have been good in rehab, and no problems with his heart meds.  He weighs here daily and been under fairly good control.  He does not have a scale at home to weigh, but is going to work with the foundation to get one.  He also wants to walk more, but needs better shoes.  He is hoping the foundation will help with this as well.   Expected Outcomes Short: Thijs will continue to come to rehab for exercise, get fitted for shoes.  He was also scheduled to meet with dietician today.  Long: Get new shoes, get scale for home use, monitor risk factors.      ITP Comments:     ITP Comments    Row Name 08/03/16 1249 08/05/16 0628 08/25/16 1023 09/02/16 0558     ITP Comments Medical Review completed. Initial ITP created.   Documentation of diagnosis can be found Encounter Office visit on 07/03/2016 30 day review. Continue with ITP unless directed changes per Medical Director review. New to program Weight up several pounds today.  Kenji had complany this weekend and they all ate well per Ferguson.  No HF  symptoms reported, he was advised to watch for symptoms and call MD about symptoms. Ziyad stated understanding of advice provided to him. 30 day review. Continue with ITP unless directed changes per Medical Director review.  New to program       Comments:

## 2016-09-03 ENCOUNTER — Encounter: Payer: BLUE CROSS/BLUE SHIELD | Attending: Cardiology

## 2016-09-03 ENCOUNTER — Telehealth: Payer: Self-pay

## 2016-09-03 DIAGNOSIS — I5022 Chronic systolic (congestive) heart failure: Secondary | ICD-10-CM | POA: Insufficient documentation

## 2016-09-03 NOTE — Telephone Encounter (Signed)
Patient's daughter called wanting to review patient's medication list since she was unable to come to his visit the other day. This was reviewed and changes were made. Patient's daughter also asked for a review of Dr. Delana MeyerBrandon's note from patient's visit with questions in regards to why patient was not scheduled for surgery. The note was discussed with her in detail. Patient's daughter states that the patient himself is not cathing, she is having to come to his house to cath daily and wanted to know what could be done for this if surgery was not an option. She was told that the patient could have a nurse visit apt for [facility]. She verbalized agreement with this and states patient will call back to schedule apt.

## 2016-09-03 NOTE — Progress Notes (Signed)
Daily Session Note  Patient Details  Name: Deno Sida MRN: [id number] Date of Birth: 1951-05-31 Referring Provider:   Flowsheet Row Cardiac Rehab from 08/03/2016 in [facility] Cardiac and Pulmonary Rehab  Referring Provider  Peggye Form MD      Encounter Date: 09/03/2016  Check In:     Session Check In - 09/03/16 0840      Check-In   Location [facility]-Cardiac & Pulmonary Rehab   Staff Present Alberteen Sam, MA, ACSM RCEP, Exercise Physiologist;Patricia Surles RN Vickki Hearing, BA, ACSM CEP, Exercise Physiologist   Supervising physician immediately available to respond to emergencies See telemetry face sheet for immediately available ER MD   Medication changes reported     No   Fall or balance concerns reported    No   Tobacco Cessation No Change   Warm-up and Cool-down Performed on first and last piece of equipment   Resistance Training Performed Yes   VAD Patient? No         History  Smoking Status  . Former Smoker  . Types: Cigarettes  . Quit date: 08/2015  Smokeless Tobacco  . Never Used    Goals Met:  Independence with exercise equipment Personal goals reviewed No report of cardiac concerns or symptoms Strength training completed today  Goals Unmet:  Not Applicable  Comments: Pt able to follow exercise prescription today without complaint.  Will continue to monitor for progression.    Dr. Emily Filbert is Medical Director for [place] and [name].

## 2016-09-08 ENCOUNTER — Encounter: Payer: BLUE CROSS/BLUE SHIELD | Admitting: Respiratory Therapy

## 2016-09-08 ENCOUNTER — Ambulatory Visit (INDEPENDENT_AMBULATORY_CARE_PROVIDER_SITE_OTHER): Payer: BLUE CROSS/BLUE SHIELD

## 2016-09-08 VITALS — BP 117/70 | HR 76 | Ht 68.0 in | Wt 259.0 lb

## 2016-09-08 DIAGNOSIS — N320 Bladder-neck obstruction: Secondary | ICD-10-CM | POA: Diagnosis not present

## 2016-09-08 DIAGNOSIS — I5022 Chronic systolic (congestive) heart failure: Secondary | ICD-10-CM

## 2016-09-08 NOTE — Progress Notes (Signed)
Daily Session Note  Patient Details  Name: Kevin Pearson MRN: [id number] Date of Birth: 1951/04/03 Referring Provider:   Flowsheet Row Cardiac Rehab from 08/03/2016 in [facility] Cardiac and Pulmonary Rehab  Referring Provider  Peggye Form MD      Encounter Date: 09/08/2016  Check In:     Session Check In - 09/08/16 0821      Check-In   Location [facility]-Cardiac & Pulmonary Rehab   Staff Present Alberteen Sam, MA, ACSM RCEP, Exercise Physiologist;Susanne Bice, RN, BSN, CCRP;Laureen Owens Shark, BS, RRT, Respiratory Therapist   Supervising physician immediately available to respond to emergencies See telemetry face sheet for immediately available ER MD   Medication changes reported     No   Fall or balance concerns reported    No   Warm-up and Cool-down Performed on first and last piece of equipment   Resistance Training Performed Yes   VAD Patient? No     Pain Assessment   Currently in Pain? No/denies   Multiple Pain Sites No         History  Smoking Status   Former Smoker   Types: Cigarettes   Quit date: 08/2015  Smokeless Tobacco   Never Used    Goals Met:  Proper associated with RPD/PD & O2 Sat Independence with exercise equipment Exercise tolerated well No report of cardiac concerns or symptoms Strength training completed today  Goals Unmet:  Not Applicable  Comments: Pt able to follow exercise prescription today without complaint.  Will continue to monitor for progression.   Dr. Emily Filbert is Medical Director for [place] and [name].

## 2016-09-08 NOTE — Progress Notes (Signed)
Continuous Intermittent Catheterization  Due to urinary retention patient is present today for a teaching of self I & O Catheterization. Patient was given detailed verbal and printed instructions of self catheterization. Patient was cleaned and prepped in a sterile fashion.  With instruction and assistance patient inserted a 14FR and urine return was noted 50 ml, urine was yellow in color. Patient tolerated well, no complications were noted Patient was given a sample bag with supplies to take home.  Instructions were given per Dr. Apolinar JunesBrandon for patient to cath once daily.  An order was placed with [facility] for catheters to be sent to the patient's home.   Preformed by: Rupert Stackshelsea Joliene Salvador, LPN   Additional Notes: Blood pressure 117/70, pulse 76, height 5\' 8"  (1.727 m), weight 259 lb (117.5 kg).

## 2016-09-09 ENCOUNTER — Other Ambulatory Visit: Payer: Self-pay | Admitting: Neurology

## 2016-09-09 DIAGNOSIS — R519 Headache, unspecified: Secondary | ICD-10-CM

## 2016-09-09 DIAGNOSIS — R51 Headache: Secondary | ICD-10-CM

## 2016-09-09 DIAGNOSIS — R4189 Other symptoms and signs involving cognitive functions and awareness: Secondary | ICD-10-CM

## 2016-09-09 DIAGNOSIS — G8929 Other chronic pain: Secondary | ICD-10-CM

## 2016-09-10 DIAGNOSIS — I5022 Chronic systolic (congestive) heart failure: Secondary | ICD-10-CM | POA: Diagnosis not present

## 2016-09-10 NOTE — Progress Notes (Signed)
Daily Session Note  Patient Details  Name: Kevin Pearson MRN: [id number] Date of Birth: 1950/08/05 Referring Provider:   Flowsheet Row Cardiac Rehab from 08/03/2016 in [facility] Cardiac and Pulmonary Rehab  Referring Provider  Peggye Form MD      Encounter Date: 09/10/2016  Check In:     Session Check In - 09/10/16 0820      Check-In   Location [facility]-Cardiac & Pulmonary Rehab   Staff Present Alberteen Sam, MA, ACSM RCEP, Exercise Physiologist;Patricia Surles RN Vickki Hearing, BA, ACSM CEP, Exercise Physiologist   Supervising physician immediately available to respond to emergencies See telemetry face sheet for immediately available ER MD   Medication changes reported     No   Fall or balance concerns reported    No   Warm-up and Cool-down Performed on first and last piece of equipment   Resistance Training Performed Yes   VAD Patient? No     Pain Assessment   Currently in Pain? No/denies           Exercise Prescription Changes - 09/09/16 1400      Response to Exercise   Blood Pressure (Admit) 140/80   Blood Pressure (Exercise) 148/78   Blood Pressure (Exit) 118/60   Heart Rate (Admit) 72 bpm   Heart Rate (Exercise) 90 bpm   Heart Rate (Exit) 69 bpm   Rating of Perceived Exertion (Exercise) 11   Symptoms none   Duration Continue with 45 min of aerobic exercise without signs/symptoms of physical distress.   Intensity THRR unchanged     Progression   Progression Continue to progress workloads to maintain intensity without signs/symptoms of physical distress.   Average METs 2.2     Resistance Training   Training Prescription Yes   Weight 3 lbs   Reps 10-15     Interval Training   Interval Training No     Oxygen   Oxygen Continuous  Did not use on seated exercises   Liters 2     Treadmill   MPH 1.3   Grade 0   Minutes 15   METs 2     NuStep   Level 5   Minutes 15   METs 2.2     Biostep-RELP   Level 2   Minutes 15   METs 2       History  Smoking Status  . Former Smoker  . Types: Cigarettes  . Quit date: 08/2015  Smokeless Tobacco  . Never Used    Goals Met:  Independence with exercise equipment Exercise tolerated well No report of cardiac concerns or symptoms Strength training completed today  Goals Unmet:  Not Applicable  Comments: Pt able to follow exercise prescription today without complaint.  Will continue to monitor for progression. Reviewed home exercise with pt today.  Pt plans to walk at home and to go to [employer] for exercise.  Reviewed THR, pulse, RPE, sign and symptoms, NTG use, and when to call 911 or MD.  Also discussed weather considerations and indoor options.  Pt voiced understanding.    Dr. Emily Filbert is Medical Director for [place] and [name].

## 2016-09-14 ENCOUNTER — Ambulatory Visit: Payer: BLUE CROSS/BLUE SHIELD

## 2016-09-15 ENCOUNTER — Telehealth: Payer: Self-pay | Admitting: Urology

## 2016-09-15 NOTE — Telephone Encounter (Signed)
Pt likes the cath you instructed him to use.  He only has 4 left.  Please give pt a call to advise what he needs to do.

## 2016-09-15 NOTE — Telephone Encounter (Signed)
Spoke with pt in reference to catheters. A new order for speedi cath was sent to [facility]. Pt voiced understanding.

## 2016-09-17 DIAGNOSIS — I5022 Chronic systolic (congestive) heart failure: Secondary | ICD-10-CM

## 2016-09-17 NOTE — Progress Notes (Signed)
Daily Session Note  Patient Details  Name: Kevin Pearson MRN: [id number] Date of Birth: 1951-02-22 Referring Provider:     Cardiac Rehab from 08/03/2016 in [facility] Cardiac and Pulmonary Rehab  Referring Provider  Peggye Form MD      Encounter Date: 09/17/2016  Check In:     Session Check In - 09/17/16 1039      Check-In   Location [facility]-Cardiac & Pulmonary Rehab   Staff Present Nada Maclachlan, BA, ACSM CEP, Exercise Physiologist;Patricia Surles RN BSN;Other  Darel Hong RN   Supervising physician immediately available to respond to emergencies See telemetry face sheet for immediately available ER MD   Medication changes reported     No   Fall or balance concerns reported    No   Warm-up and Cool-down Performed on first and last piece of equipment   Resistance Training Performed Yes   VAD Patient? No     Pain Assessment   Currently in Pain? No/denies   Multiple Pain Sites No         History  Smoking Status  . Former Smoker  . Types: Cigarettes  . Quit date: 08/2015  Smokeless Tobacco  . Never Used    Goals Met:  Proper associated with RPD/PD & O2 Sat Independence with exercise equipment Exercise tolerated well Strength training completed today  Goals Unmet:  Not Applicable  Comments: Pt able to follow exercise prescription today without complaint.  Will continue to monitor for progression.    Dr. Emily Filbert is Medical Director for [place] and [name].

## 2016-09-18 ENCOUNTER — Ambulatory Visit
Admission: RE | Admit: 2016-09-18 | Discharge: 2016-09-18 | Disposition: A | Discharge status: Home / Self Care | Payer: BLUE CROSS/BLUE SHIELD | Source: Ambulatory Visit | Attending: Neurology | Admitting: Neurology

## 2016-09-18 DIAGNOSIS — G9389 Other specified disorders of brain: Secondary | ICD-10-CM | POA: Diagnosis not present

## 2016-09-18 DIAGNOSIS — G319 Degenerative disease of nervous system, unspecified: Secondary | ICD-10-CM | POA: Diagnosis not present

## 2016-09-18 DIAGNOSIS — R4189 Other symptoms and signs involving cognitive functions and awareness: Secondary | ICD-10-CM | POA: Insufficient documentation

## 2016-09-18 DIAGNOSIS — R51 Headache: Secondary | ICD-10-CM

## 2016-09-18 DIAGNOSIS — R519 Headache, unspecified: Secondary | ICD-10-CM

## 2016-09-22 ENCOUNTER — Encounter: Payer: BLUE CROSS/BLUE SHIELD | Admitting: Respiratory Therapy

## 2016-09-22 DIAGNOSIS — I5022 Chronic systolic (congestive) heart failure: Secondary | ICD-10-CM | POA: Diagnosis not present

## 2016-09-22 NOTE — Progress Notes (Signed)
Daily Session Note  Patient Details  Name: Kevin Pearson MRN: [id number] Date of Birth: [birth year] Referring Provider:     Cardiac Rehab from 08/03/2016 in [facility] Cardiac and Pulmonary Rehab  Referring Provider  Peggye Form MD      Encounter Date: 09/22/2016  Check In:     Session Check In - 09/22/16 0830      Check-In   Location [facility]-Cardiac & Pulmonary Rehab   Staff Present Carson Myrtle, BS, RRT, Respiratory Lennie Hummer, MA, ACSM RCEP, Exercise Physiologist;Susanne Bice, RN, BSN, CCRP   Supervising physician immediately available to respond to emergencies See telemetry face sheet for immediately available ER MD   Medication changes reported     No   Fall or balance concerns reported    No   Tobacco Cessation No Change   Warm-up and Cool-down Performed on first and last piece of equipment   Resistance Training Performed Yes   VAD Patient? No     Pain Assessment   Currently in Pain? No/denies   Multiple Pain Sites No         History  Smoking Status   Former Smoker   Types: Cigarettes   Quit date: 08/2015  Smokeless Tobacco   Never Used    Goals Met:  Proper associated with RPD/PD & O2 Sat Independence with exercise equipment Exercise tolerated well Personal goals reviewed No report of cardiac concerns or symptoms Strength training completed today  Goals Unmet:  Not Applicable  Comments: Pt able to follow exercise prescription today without complaint.  Will continue to monitor for progression.   Dr. Emily Filbert is Medical Director for [place] and [name].

## 2016-09-29 ENCOUNTER — Encounter: Payer: BLUE CROSS/BLUE SHIELD | Admitting: Respiratory Therapy

## 2016-09-29 DIAGNOSIS — I5022 Chronic systolic (congestive) heart failure: Secondary | ICD-10-CM | POA: Diagnosis not present

## 2016-09-29 NOTE — Progress Notes (Signed)
Daily Session Note  Patient Details  Name: Clee Pandit MRN: [id number] Date of Birth: Nov 30, 1950 Referring Provider:     Cardiac Rehab from 08/03/2016 in [facility] Cardiac and Pulmonary Rehab  Referring Provider  Peggye Form MD      Encounter Date: 09/29/2016  Check In:     Session Check In - 09/29/16 0830      Check-In   Location [facility]-Cardiac & Pulmonary Rehab   Staff Present Carson Myrtle, BS, RRT, Respiratory Lennie Hummer, MA, ACSM RCEP, Exercise Physiologist;Susanne Bice, RN, BSN, CCRP   Supervising physician immediately available to respond to emergencies See telemetry face sheet for immediately available ER MD   Fall or balance concerns reported    No   Tobacco Cessation No Change   Warm-up and Cool-down Performed on first and last piece of equipment   Resistance Training Performed Yes   VAD Patient? No     Pain Assessment   Currently in Pain? No/denies   Multiple Pain Sites No         History  Smoking Status  . Former Smoker  . Types: Cigarettes  . Quit date: 08/2015  Smokeless Tobacco  . Never Used    Goals Met:  Independence with exercise equipment Exercise tolerated well Personal goals reviewed No report of cardiac concerns or symptoms Strength training completed today  Goals Unmet:  Not Applicable  Comments: Pt able to follow exercise prescription today without complaint.  Will continue to monitor for progression. See ITP for goal review.   Dr. Emily Filbert is Medical Director for [place] and [name].

## 2016-09-30 ENCOUNTER — Encounter: Payer: Self-pay | Admitting: *Deleted

## 2016-09-30 DIAGNOSIS — I5022 Chronic systolic (congestive) heart failure: Secondary | ICD-10-CM

## 2016-09-30 NOTE — Progress Notes (Signed)
Cardiac Individual Treatment Plan  Patient Details  Name: Kevin Pearson MRN: [id number] Date of Birth: [birth year] Referring Provider:     Cardiac Rehab from 08/03/2016 in [facility] Cardiac and Pulmonary Rehab  Referring Provider  Peggye Form MD      Initial Encounter Date:    Cardiac Rehab from 08/03/2016 in [facility] Cardiac and Pulmonary Rehab  Date  08/03/16  Referring Provider  Peggye Form MD      Visit Diagnosis: Heart failure, chronic systolic ([place])  Patient's Home Medications on Admission:  Current Outpatient Prescriptions:  .  acetaminophen (TYLENOL) 325 MG tablet, Take 650 mg by mouth every 6 (six) hours as needed., Disp: , Rfl:  .  albuterol (PROVENTIL HFA;VENTOLIN HFA) 108 (90 Base) MCG/ACT inhaler, Inhale 2 puffs into the lungs every 4 (four) hours as needed for shortness of breath., Disp: , Rfl:  .  carvedilol (COREG) 6.25 MG tablet, Take 6.25 mg by mouth 2 (two) times daily., Disp: , Rfl:  .  clonazePAM (KLONOPIN) 1 MG tablet, Take 1 mg by mouth 3 (three) times daily., Disp: , Rfl:  .  clopidogrel (PLAVIX) 75 MG tablet, Take 75 mg by mouth daily., Disp: , Rfl:  .  dicyclomine (BENTYL) 10 MG capsule, Take 1 capsule (10 mg total) by mouth 3 (three) times daily as needed for spasms., Disp: 20 capsule, Rfl: 0 .  divalproex (DEPAKOTE ER) 500 MG 24 hr tablet, Take by mouth 2 (two) times daily., Disp: , Rfl:  .  furosemide (LASIX) 40 MG tablet, Take 40 mg by mouth 2 (two) times daily., Disp: , Rfl:  .  gabapentin (NEURONTIN) 300 MG capsule, Take 300 mg by mouth 3 (three) times daily., Disp: , Rfl:  .  hydrALAZINE (APRESOLINE) 25 MG tablet, Take 25 mg by mouth 3 (three) times daily., Disp: , Rfl:  .  Hyoscyamine Sulfate SL (LEVSIN/SL) 0.125 MG SUBL, Place 1 tablet under the tongue daily., Disp: 30 each, Rfl: 0 .  ipratropium-albuterol (DUONEB) 0.5-2.5 (3) MG/3ML SOLN, Take 3 mLs by nebulization every 6 (six) hours as needed (wheezing/shortness of breath)., Disp: , Rfl:  .   losartan (COZAAR) 50 MG tablet, Take 50 mg by mouth daily., Disp: , Rfl:  .  Melatonin 5 MG TABS, Take 1 tablet by mouth at bedtime., Disp: , Rfl:  .  nitroGLYCERIN (NITROSTAT) 0.4 MG SL tablet, Place 0.4 mg under the tongue every 5 (five) minutes as needed for chest pain., Disp: , Rfl:  .  omeprazole (PRILOSEC) 40 MG capsule, Take 40 mg by mouth daily., Disp: , Rfl:  .  potassium chloride SA (K-DUR,KLOR-CON) 20 MEQ tablet, Take 20 mEq by mouth 2 (two) times daily., Disp: , Rfl:  .  rivaroxaban (XARELTO) 20 MG TABS tablet, Take 20 mg by mouth at bedtime., Disp: , Rfl:  .  rosuvastatin (CRESTOR) 20 MG tablet, Take 20 mg by mouth daily., Disp: , Rfl:  .  tamsulosin (FLOMAX) 0.4 MG CAPS capsule, TAKE ONE CAPSULE BY MOUTH EVERY DAY AFTER SUPPER, Disp: 30 capsule, Rfl: 3 .  traMADol (ULTRAM) 50 MG tablet, Take 50 mg by mouth every 6 (six) hours as needed for moderate pain or severe pain. , Disp: , Rfl:   Past Medical History: Past Medical History:  Diagnosis Date  . Acid reflux   . Anxiety   . Arthritis   . Atrial fibrillation ([place])   . CHF (congestive heart failure) ([place])   . Chronic orthostatic hypotension   . Clotting disorder ([place])   .  COPD (chronic obstructive pulmonary disease) ([place])   . Depression   . Elevated PSA   . Heart attack   . Heart disease   . Heart failure ([place])   . Hepatitis   . High cholesterol   . Hypertension   . Sleep apnea   . Urinary retention     Tobacco Use: History  Smoking Status  . Former Smoker  . Types: Cigarettes  . Quit date: 08/2015  Smokeless Tobacco  . Never Used    Labs: Recent Review Flowsheet Data    There is no flowsheet data to display.       Exercise Target Goals:    Exercise Program Goal: Individual exercise prescription set with THRR, safety & activity barriers. Participant demonstrates ability to understand and report RPE using BORG scale, to self-measure pulse accurately, and to acknowledge the importance of the exercise  prescription.  Exercise Prescription Goal: Starting with aerobic activity 30 plus minutes a day, 3 days per week for initial exercise prescription. Provide home exercise prescription and guidelines that participant acknowledges understanding prior to discharge.  Activity Barriers & Risk Stratification:     Activity Barriers & Cardiac Risk Stratification - 08/03/16 1309      Activity Barriers & Cardiac Risk Stratification   Activity Barriers Muscular Weakness;Shortness of Breath   Cardiac Risk Stratification High      6 Minute Walk:     6 Minute Walk    Row Name 08/03/16 1533         6 Minute Walk   Phase Initial     Distance 806 feet     Walk Time 5.5 minutes     # of Rest Breaks 1  30 sec     MPH 1.66     METS 2.23     RPE 16     Perceived Dyspnea  3     VO2 Peak 7.08     Symptoms Yes (comment)     Comments SOB     Resting HR 69 bpm     Resting BP 148/72     Max Ex. HR 119 bpm     Max Ex. BP 156/80     2 Minute Post BP 126/70       Interval HR   Baseline HR 69     1 Minute HR 79     2 Minute HR 80     3 Minute HR 82     4 Minute HR 109     5 Minute HR 98     6 Minute HR 97     2 Minute Post HR 84     Interval Heart Rate? Yes       Interval Oxygen   Interval Oxygen? Yes     Baseline Oxygen Saturation % 100 %     Baseline Liters of Oxygen 2 L     1 Minute Oxygen Saturation % 97 %     1 Minute Liters of Oxygen 2 L     2 Minute Oxygen Saturation % 96 %  rest break 2:45-3:15     2 Minute Liters of Oxygen 2 L     3 Minute Oxygen Saturation % 100 %     3 Minute Liters of Oxygen 2 L     4 Minute Oxygen Saturation % 97 %     4 Minute Liters of Oxygen 2 L     5 Minute Oxygen Saturation % 98 %     5  Minute Liters of Oxygen 2 L     6 Minute Oxygen Saturation % 96 %     6 Minute Liters of Oxygen 2 L     2 Minute Post Oxygen Saturation % 100 %     2 Minute Post Liters of Oxygen 2 L        Oxygen Initial Assessment:   Oxygen Re-Evaluation:   Oxygen  Discharge (Final Oxygen Re-Evaluation):   Initial Exercise Prescription:     Initial Exercise Prescription - 08/03/16 1500      Date of Initial Exercise RX and Referring Provider   Date 08/03/16   Referring Provider Peggye Form MD     Oxygen   Oxygen Continuous   Liters 2     Treadmill   MPH 1.3   Grade 0   Minutes 15   METs 2     NuStep   Level 1   Minutes 15   METs 2     Biostep-RELP   Level 2   Minutes 15   METs 2     Prescription Details   Frequency (times per week) 2   Duration Progress to 45 minutes of aerobic exercise without signs/symptoms of physical distress     Intensity   THRR 40-80% of Max Heartrate 104-139   Ratings of Perceived Exertion 11-15   Perceived Dyspnea 0-4     Progression   Progression Continue to progress workloads to maintain intensity without signs/symptoms of physical distress.     Resistance Training   Training Prescription Yes   Weight 3 lbs   Reps 10-15      Perform Capillary Blood Glucose checks as needed.  Exercise Prescription Changes:     Exercise Prescription Changes    Row Name 08/03/16 1400 08/12/16 1400 08/25/16 1600 09/09/16 1400 09/10/16 1000     Response to Exercise   Blood Pressure (Admit) 148/72 106/70 130/72 140/80  -   Blood Pressure (Exercise) 156/80 130/80 134/70 148/78  -   Blood Pressure (Exit) 126/70 126/70 100/62 118/60  -   Heart Rate (Admit) 69 bpm 69 bpm 68 bpm 72 bpm  -   Heart Rate (Exercise) 119 bpm 98 bpm 95 bpm 90 bpm  -   Heart Rate (Exit) 84 bpm 53 bpm 70 bpm 69 bpm  -   Oxygen Saturation (Admit) 100 %  -  -  -  -   Oxygen Saturation (Exercise) 96 %  -  -  -  -   Rating of Perceived Exertion (Exercise) '16 9 11 11  ' -   Perceived Dyspnea (Exercise) 3  -  -  -  -   Symptoms SOB none none none none   Duration  - Progress to 45 minutes of aerobic exercise without signs/symptoms of physical distress Progress to 45 minutes of aerobic exercise without signs/symptoms of physical  distress Continue with 45 min of aerobic exercise without signs/symptoms of physical distress. Continue with 45 min of aerobic exercise without signs/symptoms of physical distress.   Intensity  - THRR unchanged THRR unchanged THRR unchanged THRR unchanged     Progression   Progression  - Continue to progress workloads to maintain intensity without signs/symptoms of physical distress. Continue to progress workloads to maintain intensity without signs/symptoms of physical distress. Continue to progress workloads to maintain intensity without signs/symptoms of physical distress. Continue to progress workloads to maintain intensity without signs/symptoms of physical distress.   Average METs  - 1.35 2.23 2.2 2.2  Resistance Training   Training Prescription  - Yes Yes Yes Yes   Weight  - 3 lbs 3 lbs 3 lbs 3 lbs   Reps  - 10-15 10-15 10-15 10-15     Interval Training   Interval Training  - No No No No     Oxygen   Oxygen  -  - Continuous  when he remembers to wear it Continuous  Did not use on seated exercises Continuous  Did not use on seated exercises   Liters 2  - '2 2 2     ' Treadmill   MPH  -  - 1.3 1.3 1.3   Grade  -  - 0 0 0   Minutes  -  - '15 15 15   ' METs  -  - '2 2 2     ' NuStep   Level  - '1 4 5 5   ' Minutes  - '15 15 15 15   ' METs  - 1.7 2.4 2.2 2.2     Biostep-RELP   Level  - '2 2 2 2   ' Minutes  - '15 15 15 15   ' METs  - '1 2 2 2     ' Home Exercise Plan   Plans to continue exercise at  -  -  -  - Forensic scientist (comment)  Neurosurgeon and walking   Frequency  -  -  -  - Add 3 additional days to program exercise sessions.   Initial Home Exercises Provided  -  -  -  - 09/10/16     Exercise Review   Progression -  walk test results -  First Full Day of Exercise Yes  -  -   Row Name 09/23/16 1000             Response to Exercise   Blood Pressure (Admit) 134/70       Blood Pressure (Exercise) 134/70       Blood Pressure (Exit) 126/62       Heart Rate (Admit) 88  bpm       Heart Rate (Exercise) 104 bpm       Heart Rate (Exit) 94 bpm       Rating of Perceived Exertion (Exercise) 12       Symptoms none       Duration Continue with 45 min of aerobic exercise without signs/symptoms of physical distress.       Intensity THRR unchanged         Progression   Progression Continue to progress workloads to maintain intensity without signs/symptoms of physical distress.       Average METs 2.3         Resistance Training   Training Prescription Yes       Weight 3 lbs       Reps 10-15         Interval Training   Interval Training No         Oxygen   Oxygen Continuous       Liters 2         Treadmill   MPH 1.8       Grade 0.5       Minutes 15       METs 2.5         NuStep   Level 5       Minutes 15       METs 2.4         Biostep-RELP  Level 2       Minutes 15       METs 2         Home Exercise Plan   Plans to continue exercise at [employer] (comment)  [employer] and walking       Frequency Add 3 additional days to program exercise sessions.       Initial Home Exercises Provided 09/10/16          Exercise Comments:     Exercise Comments    Row Name 08/11/16 1122 08/25/16 1618 09/03/16 1043 09/03/16 1047 09/10/16 0951   Exercise Comments First full day of exercise!  Patient was oriented to gym and equipment including functions, settings, policies, and procedures.  Patient's individual exercise prescription and treatment plan were reviewed.  All starting workloads were established based on the results of the 6 minute walk test done at initial orientation visit.  The plan for exercise progression was also introduced and progression will be customized based on patient's performance and goals. Sloane is off to a good start with his rehab.  He is able to get from piece to piece without as much help now.  He is also up to level 4 on the NuStep. We will continue to monitor his progression. MET levels reviewed today. MET levels  reviewed today. Reviewed METs average and discussed progression with pt today.      Exercise Goals and Review:   Exercise Goals Re-Evaluation :     Exercise Goals Re-Evaluation    Row Name 09/09/16 1445 09/10/16 1042 09/23/16 1012 09/29/16 1040       Exercise Goal Re-Evaluation   Exercise Goals Review Increase Physical Activity;Increase Strenth and Stamina Increase Physical Activity Increase Physical Activity;Increase Strenth and Stamina Increase Physical Activity;Increase Strenth and Stamina    Comments Gregery has been doing well in rehab.  He is getting stronger and has noticed his stamina is starting to improve as well.  He is now up to level 5 on the NuStep.  We will continue to monitor his progression Reviewed home exercise with pt today.  Pt plans to walk at home and to go to [employer] for exercise.  Reviewed THR, pulse, RPE, sign and symptoms, NTG use, and when to call 911 or MD.  Also discussed weather considerations and indoor options.  Pt voiced understanding. Carthel has been doing well with rehab.  He has not yet started walking at home as he has had some on going health stressors.  He is making some progress and is up to 1.8 mph on the treadmill.  We will continue to monitor his progression Artemio is feeling stronger and has increased his stamina since starting rehab.  He does continue to feel weak overall, but can tell the benefits of exercise.  He has yet to add in his home exercise as other things keep coming up.  He is going to try to make his health and exercise a greater priority in his life.    Expected Outcomes Short: Continue to come to rehab exercise classes for increased activity and review home exercise guidelines.  Long: Become more independent in his exercise to continue to work on strength and stamina. Short: Kyi will add in home exercise.  Long: Keller will become more independent in exercise. Short: Tilton will start walking at home and increase some on the  BioStep.  Long: Continue to work on improving strength and stamina. Short: Jaishon will start to go to the gym some on  his off days.  Long: Exercise will become more routine to build strength and stamina.       Discharge Exercise Prescription (Final Exercise Prescription Changes):     Exercise Prescription Changes - 09/23/16 1000      Response to Exercise   Blood Pressure (Admit) 134/70   Blood Pressure (Exercise) 134/70   Blood Pressure (Exit) 126/62   Heart Rate (Admit) 88 bpm   Heart Rate (Exercise) 104 bpm   Heart Rate (Exit) 94 bpm   Rating of Perceived Exertion (Exercise) 12   Symptoms none   Duration Continue with 45 min of aerobic exercise without signs/symptoms of physical distress.   Intensity THRR unchanged     Progression   Progression Continue to progress workloads to maintain intensity without signs/symptoms of physical distress.   Average METs 2.3     Resistance Training   Training Prescription Yes   Weight 3 lbs   Reps 10-15     Interval Training   Interval Training No     Oxygen   Oxygen Continuous   Liters 2     Treadmill   MPH 1.8   Grade 0.5   Minutes 15   METs 2.5     NuStep   Level 5   Minutes 15   METs 2.4     Biostep-RELP   Level 2   Minutes 15   METs 2     Home Exercise Plan   Plans to continue exercise at [employer] (comment)  [employer] and walking   Frequency Add 3 additional days to program exercise sessions.   Initial Home Exercises Provided 09/10/16      Nutrition:  Target Goals: Understanding of nutrition guidelines, daily intake of sodium <1567m, cholesterol <2055m calories 30% from fat and 7% or less from saturated fats, daily to have 5 or more servings of fruits and vegetables.  Biometrics:     Pre Biometrics - 08/03/16 1540      Pre Biometrics   Height 5' 7.9" (1.725 m)   Weight 264 lb 3.2 oz (119.8 kg)   Waist Circumference 47 inches   Hip Circumference 47 inches   Waist to Hip Ratio 1 %   BMI  (Calculated) 40.4   Single Leg Stand 1.89 seconds       Nutrition Therapy Plan and Nutrition Goals:     Nutrition Therapy & Goals - 08/27/16 1139      Nutrition Therapy   Diet basic heart healthy   Drug/Food Interactions Statins/Certain Fruits   Protein (specify units) 8-9oz   Fiber 30 grams   Whole Grain Foods 3 servings   Saturated Fats 14 max. grams   Fruits and Vegetables 8 servings/day   Sodium 1500 grams     Personal Nutrition Goals   Nutrition Goal Continue to increase vegetable and fruit intake, as well as whole grains, nuts, beans, fish.      Intervention Plan   Intervention Prescribe, educate and counsel regarding individualized specific dietary modifications aiming towards targeted core components such as weight, hypertension, lipid management, diabetes, heart failure and other comorbidities.;Nutrition handout(s) given to patient.   Expected Outcomes Short Term Goal: Understand basic principles of dietary content, such as calories, fat, sodium, cholesterol and nutrients.;Short Term Goal: A plan has been developed with personal nutrition goals set during dietitian appointment.;Long Term Goal: Adherence to prescribed nutrition plan.      Nutrition Discharge: Rate Your Plate Scores:     Nutrition Assessments - 08/03/16 1254  MEDFICTS Scores   Pre Score 60      Nutrition Goals Re-Evaluation:     Nutrition Goals Re-Evaluation    [place] Name 09/29/16 1124             Goals   Current Weight 264 lb (119.7 kg)       Nutrition Goal Continue to increase vegetable and fruit intake, as well as whole grains, nuts, beans, fish. Increase protein intake       Comment Kelsen is eating more fruits and vegetables.  He has completely switched to whole grains!  He has tried to increase his protien, but is only getting about 6 oz verus 8-9 oz.  He is using muscle milk and Boost for supplements. We talked about using real food versus supplements.       Expected Outcome  Short: Charan will try to eat more real food and meals throughout the day.  Long: He will continue to work on dietary improvements.          Nutrition Goals Discharge (Final Nutrition Goals Re-Evaluation):     Nutrition Goals Re-Evaluation - 09/29/16 1124      Goals   Current Weight 264 lb (119.7 kg)   Nutrition Goal Continue to increase vegetable and fruit intake, as well as whole grains, nuts, beans, fish. Increase protein intake   Comment Jori is eating more fruits and vegetables.  He has completely switched to whole grains!  He has tried to increase his protien, but is only getting about 6 oz verus 8-9 oz.  He is using muscle milk and Boost for supplements. We talked about using real food versus supplements.   Expected Outcome Short: [place]wood will try to eat more real food and meals throughout the day.  Long: He will continue to work on dietary improvements.      Psychosocial: Target Goals: Acknowledge presence or absence of significant depression and/or stress, maximize coping skills, provide positive support system. Participant is able to verbalize types and ability to use techniques and skills needed for reducing stress and depression.   Initial Review & Psychosocial Screening:     Initial Psych Review & Screening - 08/03/16 1312      Initial Review   Comments Has chronic urological problem that requires surgical intervention. On hold until at least March.       Quality of Life Scores:      Quality of Life - 08/03/16 1303      Quality of Life Scores   Health/Function Pre 8.17 %   Socioeconomic Pre 8.43 %   Psych/Spiritual Pre 10.14 %   Family Pre 12.7 %   GLOBAL Pre 9.29 %      PHQ-9: Recent Review Flowsheet Data    Depression screen [facility] 2/9 08/03/2016 08/03/2016   Decreased Interest - 1   Down, Depressed, Hopeless - 1   PHQ - 2 Score - 2   Altered sleeping - 1   Tired, decreased energy - 1   Change in appetite - 1   Feeling bad or failure about yourself  - 2    Trouble concentrating - 1   Moving slowly or fidgety/restless - 2   Suicidal thoughts (No Data)  0   PHQ-9 Score - 10   Difficult doing work/chores - Somewhat difficult     Interpretation of Total Score  Total Score Depression Severity:  1-4 = Minimal depression, 5-9 = Mild depression, 10-14 = Moderate depression, 15-19 = Moderately severe depression, 20-27 = Severe depression  Psychosocial Evaluation and Intervention:     Psychosocial Evaluation - 08/11/16 0928      Psychosocial Evaluation & Interventions   Interventions Encouraged to exercise with the program and follow exercise prescription;Stress management education   Comments Counselor met with Mr. Zoeller Deman) today for initial psychosocial evaluation.  He is a 66 year old who has quite a history of cardiac health issues with Congestive heart Failure and a pacemaker and defibrolator.  He also has COPD and some urological issues that are currently under treatment.  Mr. Youtz divorced 10 years ago after 23 years of marriage.  He was living in [place] until several months ago and his health had declined to such a degree that he moved here with his daughter and son-in-law.  His daughter is an Therapist, sports.  Mr. Neisen states he sleeps well and has a good appetite.  He admits to a history of depression and anxiety and is on medications for current symptoms, which he says may need more treatment - so he mentioned the need for a psychiatrist locally to help with this.  He states his mood is generally stable but he has had multiple stressors with the recent move and health issues.  His goals are to "get back on track" so he can move forward with the urological surgery in the next month or so.  Counselor will provide Mr. Kothari with a list of local psychiatrists to help with his current mood and to manage his medications.     Continue Psychosocial Services  --  Counselor provided Psychiatrists names and numbers and will follow with Mr. Helt on his mood  and stress.  He will benefit from the psychoeducational components of this program.        Psychosocial Re-Evaluation:     Psychosocial Re-Evaluation    [place] Name 08/20/16 [phone] 09/29/16 1127           Psychosocial Re-Evaluation   Current issues with  - Current Depression;Current Anxiety/Panic;Current Stress Concerns      Comments Counselor follow up with Mr. Maring stating he is enjoying this program and feels it is beneficial overall.  He has not contacted a psychiatrist yet to help with his mental health needs here locally.  Counselor provided him with a great local resource that he agreed to contact.  She has appointments available within the next 3 weeks.  Counselor and Cardiac Rehab staff will continue to follow with Mr. Ballantine.   Trayveon is scheduled to meet with psychiatrist in two weeks.  He has noticed an improvement in how he feels since starting, but now has ongoing panic attacks and stress about his health.        Expected Outcomes  - Short: Continue to exercise and meet with pyschiatrist.  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        Initial Review   Source of Stress Concerns  - Chronic Illness;Unable to perform yard/household activities;Unable to participate in former interests or hobbies;Family         Psychosocial Discharge (Final Psychosocial Re-Evaluation):     Psychosocial Re-Evaluation - 09/29/16 1127  Psychosocial Re-Evaluation   Current issues with Current Depression;Current Anxiety/Panic;Current Stress Concerns   Comments Montay is scheduled to meet with psychiatrist in two weeks.  He has noticed an improvement in  how he feels since starting, but now has ongoing panic attacks and stress about his health.     Expected Outcomes Short: Continue to exercise and meet with pyschiatrist.  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     Initial Review   Source of Stress Concerns Chronic Illness;Unable to perform yard/household activities;Unable to participate in former interests or hobbies;Family      Vocational Rehabilitation: Provide vocational rehab assistance to qualifying candidates.   Vocational Rehab Evaluation & Intervention:     Vocational Rehab - 08/03/16 1311      Initial Vocational Rehab Evaluation & Intervention   Assessment shows need for Vocational Rehabilitation No      Education: Education Goals: Education classes will be provided on a weekly basis, covering required topics. Participant will state understanding/return demonstration of topics presented.  Learning Barriers/Preferences:     Learning Barriers/Preferences - 08/03/16 1310      Learning Barriers/Preferences   Learning Barriers Hearing   Learning Preferences Skilled Demonstration      Education Topics: General Nutrition Guidelines/Fats and Fiber: -Group instruction provided by verbal, written material, models and posters to present the general guidelines for heart healthy nutrition. Gives an explanation and review of dietary fats and fiber.   Cardiac Rehab from 09/29/2016 in [facility] Cardiac and Pulmonary Rehab  Date  09/08/16  Educator  CR  Instruction Review Code  2- meets goals/outcomes      Controlling Sodium/Reading Food Labels: -Group verbal and written material supporting the  discussion of sodium use in heart healthy nutrition. Review and explanation with models, verbal and written materials for utilization of the food label.   Exercise Physiology & Risk Factors: - Group verbal and written instruction with models to review the exercise physiology of the cardiovascular system and associated critical values. Details cardiovascular disease risk factors and the goals associated with each risk factor.   Cardiac Rehab from 09/29/2016 in [facility] Cardiac and Pulmonary Rehab  Date  09/22/16  Educator  [facility]  Instruction Review Code  2- meets goals/outcomes      Aerobic Exercise & Resistance Training: - Gives group verbal and written discussion on the health impact of inactivity. On the components of aerobic and resistive training programs and the benefits of this training and how to safely progress through these programs.   Flexibility, Balance, General Exercise Guidelines: - Provides group verbal and written instruction on the benefits of flexibility and balance training programs. Provides general exercise guidelines with specific guidelines to those with heart or lung disease. Demonstration and skill practice provided.   Cardiac Rehab from 09/29/2016 in [facility] Cardiac and Pulmonary Rehab  Date  09/29/16  Educator  [facility]  Instruction Review Code  2- meets goals/outcomes      Stress Management: - Provides group verbal and written instruction about the health risks of elevated stress, cause of high stress, and healthy ways to reduce stress.   Cardiac Rehab from 09/29/2016 in [facility] Cardiac and Pulmonary Rehab  Date  08/13/16  Educator  TS  Instruction  Review Code  2- meets goals/outcomes      Depression: - Provides group verbal and written instruction on the correlation between heart/lung disease and depressed mood, treatment options, and the stigmas associated with seeking treatment.   Cardiac Rehab from 09/29/2016 in [facility] Cardiac and Pulmonary Rehab  Date  09/10/16  Educator   TS  Instruction Review Code  2- meets goals/outcomes      Anatomy & Physiology of the Heart: - Group verbal and written instruction and models provide basic cardiac anatomy and physiology, with the coronary electrical and arterial systems. Review of: AMI, Angina, Valve disease, Heart Failure, Cardiac Arrhythmia, Pacemakers, and the ICD.   Cardiac Rehab from 09/29/2016 in [facility] Cardiac and Pulmonary Rehab  Date  08/11/16  Educator  CE  Instruction Review Code  2- meets goals/outcomes      Cardiac Procedures: - Group verbal and written instruction and models to describe the testing methods done to diagnose heart disease. Reviews the outcomes of the test results. Describes the treatment choices: Medical Management, Angioplasty, or Coronary Bypass Surgery.   Cardiac Rehab from 09/29/2016 in [facility] Cardiac and Pulmonary Rehab  Date  08/18/16  Educator  CE  Instruction Review Code  2- meets goals/outcomes      Cardiac Medications: - Group verbal and written instruction to review commonly prescribed medications for heart disease. Reviews the medication, class of the drug, and side effects. Includes the steps to properly store meds and maintain the prescription regimen.   Cardiac Rehab from 09/29/2016 in [facility] Cardiac and Pulmonary Rehab  Date  09/03/16  Educator  TS  Instruction Review Code  2- meets goals/outcomes [part 2]      Go Sex-Intimacy & Heart Disease, Get SMART - Goal Setting: - Group verbal and written instruction through game format to discuss heart disease and the return to sexual intimacy. Provides group verbal and written material to discuss and apply goal setting through the application of the S.M.A.R.T. Method.   Cardiac Rehab from 09/29/2016 in [facility] Cardiac and Pulmonary Rehab  Date  08/18/16  Educator  CE  Instruction Review Code  2- meets goals/outcomes      Other Matters of the Heart: - Provides group verbal, written materials and models to describe Heart Failure,  Angina, Valve Disease, and Diabetes in the realm of heart disease. Includes description of the disease process and treatment options available to the cardiac patient.   Cardiac Rehab from 09/29/2016 in [facility] Cardiac and Pulmonary Rehab  Date  08/11/16  Educator  CE  Instruction Review Code  2- meets goals/outcomes      Exercise & Equipment Safety: - Individual verbal instruction and demonstration of equipment use and safety with use of the equipment.   Cardiac Rehab from 09/29/2016 in [facility] Cardiac and Pulmonary Rehab  Date  08/03/16  Educator  SB  Instruction Review Code  2- meets goals/outcomes      Infection Prevention: - Provides verbal and written material to individual with discussion of infection control including proper hand washing and proper equipment cleaning during exercise session.   Cardiac Rehab from 09/29/2016 in [facility] Cardiac and Pulmonary Rehab  Date  08/03/16  Educator  Sb  Instruction Review Code  2- meets goals/outcomes      Falls Prevention: - Provides verbal and written material to individual with discussion of falls prevention and safety.   Cardiac Rehab from 09/29/2016 in [facility] Cardiac and Pulmonary Rehab  Date  08/03/16  Educator  Sb  Instruction Review Code  2- meets goals/outcomes      Diabetes: - Individual verbal and written instruction to review signs/symptoms of diabetes, desired ranges of glucose level fasting, after meals and with exercise. Advice that pre and post exercise glucose checks will be done for 3 sessions at entry of program.    Knowledge Questionnaire Score:     Knowledge Questionnaire Score - 08/03/16 1310      Knowledge Questionnaire Score   Pre Score 25/28  responses reviewed wiht verbal response of understanding      Core Components/Risk Factors/Patient Goals at Admission:     Personal Goals and Risk Factors at Admission - 08/03/16 1311      Core Components/Risk Factors/Patient Goals on Admission    Weight Management  Yes;Obesity;Weight Loss   Admit Weight 264 lb 3.2 oz (119.8 kg)   Goal Weight: Short Term 260 lb (117.9 kg)   Goal Weight: Long Term 180 lb (81.6 kg)   Expected Outcomes Short Term: Continue to assess and modify interventions until short term weight is achieved;Long Term: Adherence to nutrition and physical activity/exercise program aimed toward attainment of established weight goal;Weight Loss: Understanding of general recommendations for a balanced deficit meal plan, which promotes 1-2 lb weight loss per week and includes a negative energy balance of [phone] kcal/d;Understanding recommendations for meals to include 15-35% energy as protein, 25-35% energy from fat, 35-60% energy from carbohydrates, less than 249m of dietary cholesterol, 20-35 gm of total fiber daily;Understanding of distribution of calorie intake throughout the day with the consumption of 4-5 meals/snacks   Sedentary Yes   Intervention Provide advice, education, support and counseling about physical activity/exercise needs.;Develop an individualized exercise prescription for aerobic and resistive training based on initial evaluation findings, risk stratification, comorbidities and participant's personal goals.   Expected Outcomes Achievement of increased cardiorespiratory fitness and enhanced flexibility, muscular endurance and strength shown through measurements of functional capacity and personal statement of participant.   Increase Strength and Stamina Yes   Intervention Provide advice, education, support and counseling about physical activity/exercise needs.;Develop an individualized exercise prescription for aerobic and resistive training based on initial evaluation findings, risk stratification, comorbidities and participant's personal goals.   Expected Outcomes Achievement of increased cardiorespiratory fitness and enhanced flexibility, muscular endurance and strength shown through measurements of functional capacity and personal  statement of participant.   Improve shortness of breath with ADL's Yes  Has COPD, wears oxygen as prescribed   Intervention Provide education, individualized exercise plan and daily activity instruction to help decrease symptoms of SOB with activities of daily living.   Expected Outcomes Short Term: Achieves a reduction of symptoms when performing activities of daily living.   Heart Failure Yes   Intervention Provide a combined exercise and nutrition program that is supplemented with education, support and counseling about heart failure. Directed toward relieving symptoms such as shortness of breath, decreased exercise tolerance, and extremity edema.   Expected Outcomes Improve functional capacity of life;Short term: Attendance in program 2-3 days a week with increased exercise capacity. Reported lower sodium intake. Reported increased fruit and vegetable intake. Reports medication compliance.;Short term: Daily weights obtained and reported for increase. Utilizing diuretic protocols set by physician.;Long term: Adoption of self-care skills and reduction of barriers for early signs and symptoms recognition and intervention leading to self-care maintenance.   Hypertension Yes   Intervention Provide education on lifestyle modifcations including regular physical activity/exercise, weight management, moderate sodium restriction and increased consumption of fresh fruit, vegetables, and low fat dairy, alcohol moderation, and smoking  cessation.;Monitor prescription use compliance.   Expected Outcomes Short Term: Continued assessment and intervention until BP is < 140/22m HG in hypertensive participants. < 130/825mHG in hypertensive participants with diabetes, heart failure or chronic kidney disease.;Long Term: Maintenance of blood pressure at goal levels.   Lipids Yes   Intervention Provide education and support for participant on nutrition & aerobic/resistive exercise along with prescribed medications to  achieve LDL <7036mHDL >28m79m Expected Outcomes Short Term: Participant states understanding of desired cholesterol values and is compliant with medications prescribed. Participant is following exercise prescription and nutrition guidelines.;Long Term: Cholesterol controlled with medications as prescribed, with individualized exercise RX and with personalized nutrition plan. Value goals: LDL < 70mg84mL > 40 mg.   Stress Yes   Intervention Offer individual and/or small group education and counseling on adjustment to heart disease, stress management and health-related lifestyle change. Teach and support self-help strategies.;Refer participants experiencing significant psychosocial distress to appropriate mental health specialists for further evaluation and treatment. When possible, include family members and significant others in education/counseling sessions.   Expected Outcomes Short Term: Participant demonstrates changes in health-related behavior, relaxation and other stress management skills, ability to obtain effective social support, and compliance with psychotropic medications if prescribed.;Long Term: Emotional wellbeing is indicated by absence of clinically significant psychosocial distress or social isolation.      Core Components/Risk Factors/Patient Goals Review:      Goals and Risk Factor Review    Row Name 08/27/16 1442 09/29/16 1042           Core Components/Risk Factors/Patient Goals Review   Personal Goals Review Sedentary;Increase Strength and Stamina;Lipids;Hypertension;Heart Failure;Weight Management/Obesity Weight Management/Obesity;Lipids;Hypertension;Heart Failure      Review CarmeZaiydenmentioned that he is already noticing an improvement since beginning rehab. He has said that even his daughter has noticed the changes in him.  He not forgetting as much and moving better.  His blood pressures have been good in rehab, and no problems with his heart meds.  He weighs here  daily and been under fairly good control.  He does not have a scale at home to weigh, but is going to work with the foundation to get one.  He also wants to walk more, but needs better shoes.  He is hoping the foundation will help with this as well. CarmePazinues to see the benefits of rehab.  His weight is down today and he now has a scale (through the [employer] his weight daily.  He denies any heart failure symptoms currently.  His blood pressures have been good and he has not had any problems with his medications.      Expected Outcomes Short: CarmeDamascus continue to come to rehab for exercise, get fitted for shoes.  He was also scheduled to meet with dietician today.  Long: Get new shoes, get scale for home use, monitor risk factors. Short: CarmeDeondra start to use his scale to help monitor symptoms.  Long: He will continue to work on risk factor modification.         Core Components/Risk Factors/Patient Goals at Discharge (Final Review):      Goals and Risk Factor Review - 09/29/16 1042      Core Components/Risk Factors/Patient Goals Review   Personal Goals Review Weight Management/Obesity;Lipids;Hypertension;Heart Failure   Review CarmeIaminues to see the benefits of rehab.  His weight is down today and he now has a scale (through the [employer] his weight daily.  He denies any heart failure symptoms currently.  His blood pressures have been good and he has not had any problems with his medications.   Expected Outcomes Short: Linzie will start to use his scale to help monitor symptoms.  Long: He will continue to work on risk factor modification.      ITP Comments:     ITP Comments    Row Name 08/03/16 1249 08/05/16 0628 08/25/16 1023 09/02/16 0558 09/30/16 0557   ITP Comments Medical Review completed. Initial ITP created.   Documentation of diagnosis can be found Encounter Office visit on 07/03/2016 30 day review. Continue with ITP unless directed changes per  Medical Director review. New to program Weight up several pounds today.  Ebon had complany this weekend and they all ate well per [place].  No HF symptoms reported, he was advised to watch for symptoms and call MD about symptoms. Eliud stated understanding of advice provided to him. 30 day review. Continue with ITP unless directed changes per Medical Director review.  New to program 30 day review. Continue with ITP unless directed changes per Medical Director review      Comments:

## 2016-10-01 DIAGNOSIS — I5022 Chronic systolic (congestive) heart failure: Secondary | ICD-10-CM

## 2016-10-01 NOTE — Progress Notes (Signed)
Daily Session Note  Patient Details  Name: Ormand Senn MRN: [id number] Date of Birth: 08/20/1950 Referring Provider:     Cardiac Rehab from 08/03/2016 in [facility] Cardiac and Pulmonary Rehab  Referring Provider  Peggye Form MD      Encounter Date: 10/01/2016  Check In:     Session Check In - 10/01/16 0910      Check-In   Location [facility]-Cardiac & Pulmonary Rehab   Staff Present Alberteen Sam, MA, ACSM RCEP, Exercise Physiologist;Mary Kellie Shropshire, RN, BSN, MA   Supervising physician immediately available to respond to emergencies See telemetry face sheet for immediately available ER MD   Medication changes reported     No   Fall or balance concerns reported    No   Warm-up and Cool-down Performed on first and last piece of equipment   Resistance Training Performed Yes   VAD Patient? No     Pain Assessment   Currently in Pain? No/denies         History  Smoking Status  . Former Smoker  . Types: Cigarettes  . Quit date: 08/2015  Smokeless Tobacco  . Never Used    Goals Met:  Independence with exercise equipment Exercise tolerated well No report of cardiac concerns or symptoms Strength training completed today  Goals Unmet:  Not Applicable  Comments: Pt able to follow exercise prescription today without complaint.  Will continue to monitor for progression.   Dr. Emily Filbert is Medical Director for [facility] and [name].

## 2016-10-06 ENCOUNTER — Emergency Department
Admission: EM | Admit: 2016-10-06 | Discharge: 2016-10-06 | Disposition: A | Discharge status: Home / Self Care | Payer: BLUE CROSS/BLUE SHIELD | Attending: Emergency Medicine | Admitting: Emergency Medicine

## 2016-10-06 ENCOUNTER — Emergency Department: Payer: BLUE CROSS/BLUE SHIELD

## 2016-10-06 ENCOUNTER — Encounter: Payer: Self-pay | Admitting: Emergency Medicine

## 2016-10-06 ENCOUNTER — Encounter: Payer: BLUE CROSS/BLUE SHIELD | Attending: Cardiology

## 2016-10-06 DIAGNOSIS — I110 Hypertensive heart disease with heart failure: Secondary | ICD-10-CM | POA: Insufficient documentation

## 2016-10-06 DIAGNOSIS — I5022 Chronic systolic (congestive) heart failure: Secondary | ICD-10-CM | POA: Insufficient documentation

## 2016-10-06 DIAGNOSIS — R911 Solitary pulmonary nodule: Secondary | ICD-10-CM | POA: Diagnosis not present

## 2016-10-06 DIAGNOSIS — R042 Hemoptysis: Secondary | ICD-10-CM | POA: Insufficient documentation

## 2016-10-06 DIAGNOSIS — J449 Chronic obstructive pulmonary disease, unspecified: Secondary | ICD-10-CM | POA: Insufficient documentation

## 2016-10-06 DIAGNOSIS — Z87891 Personal history of nicotine dependence: Secondary | ICD-10-CM | POA: Insufficient documentation

## 2016-10-06 LAB — COMPREHENSIVE METABOLIC PANEL
ALT: 9 U/L — ABNORMAL LOW (ref 17–63)
AST: 33 U/L (ref 15–41)
Albumin: 3.4 g/dL — ABNORMAL LOW (ref 3.5–5.0)
Alkaline Phosphatase: 43 U/L (ref 38–126)
Anion gap: 9 (ref 5–15)
BUN: 23 mg/dL — ABNORMAL HIGH (ref 6–20)
CO2: 25 mmol/L (ref 22–32)
Calcium: 9.4 mg/dL (ref 8.9–10.3)
Chloride: 104 mmol/L (ref 101–111)
Creatinine, Ser: 1.26 mg/dL — ABNORMAL HIGH (ref 0.61–1.24)
GFR calc Af Amer: >60 mL/min (ref >60)
GFR calc non Af Amer: 59 mL/min — ABNORMAL LOW (ref >60)
Glucose, Bld: 135 mg/dL — ABNORMAL HIGH (ref 65–99)
Potassium: 4.9 mmol/L (ref 3.5–5.1)
Sodium: 138 mmol/L (ref 135–145)
Total Bilirubin: 0.6 mg/dL (ref 0.3–1.2)
Total Protein: 7.4 g/dL (ref 6.5–8.1)

## 2016-10-06 LAB — CBC WITH DIFFERENTIAL/PLATELET
Basophils Absolute: 0.1 10*3/uL (ref 0–0.1)
Basophils Relative: 1 %
Eosinophils Absolute: 0.3 10*3/uL (ref 0–0.7)
Eosinophils Relative: 2 %
HCT: 36.6 % — ABNORMAL LOW (ref 40.0–52.0)
Hemoglobin: 11.7 g/dL — ABNORMAL LOW (ref 13.0–18.0)
Lymphocytes Relative: 12 %
Lymphs Abs: 1.7 10*3/uL (ref 1.0–3.6)
MCH: 26 pg (ref 26.0–34.0)
MCHC: 32.1 g/dL (ref 32.0–36.0)
MCV: 81 fL (ref 80.0–100.0)
Monocytes Absolute: 1.3 10*3/uL — ABNORMAL HIGH (ref 0.2–1.0)
Monocytes Relative: 10 %
Neutro Abs: 10.1 10*3/uL — ABNORMAL HIGH (ref 1.4–6.5)
Neutrophils Relative %: 75 %
Platelets: 246 10*3/uL (ref 150–440)
RBC: 4.52 MIL/uL (ref 4.40–5.90)
RDW: 17.6 % — ABNORMAL HIGH (ref 11.5–14.5)
WBC: 13.5 10*3/uL — ABNORMAL HIGH (ref 3.8–10.6)

## 2016-10-06 LAB — APTT: aPTT: 35 seconds (ref 24–36)

## 2016-10-06 LAB — PROTIME-INR
INR: 1.31
Prothrombin Time: 16.4 seconds — ABNORMAL HIGH (ref 11.4–15.2)

## 2016-10-06 MED ORDER — IOPAMIDOL (ISOVUE-370) INJECTION 76%
75.0000 mL | Freq: Once | INTRAVENOUS | Status: AC | PRN
  Administered 2016-10-06: 75 mL via INTRAVENOUS

## 2016-10-06 NOTE — Discharge Instructions (Signed)
You evaluated for coughing up blood, and your exam and evaluation are reassuring in the emergency department today.  As we discussed, no certain cause was found, but suspect bronchitis or inflammation while you're on the blood thinner Xarelto.  Return to emergency department immediately for any worsening amount of coughing up blood, any trouble breathing, any pain with breathing, vomiting blood, dizziness or passing out, or any other symptoms concerning to you.

## 2016-10-06 NOTE — ED Notes (Signed)
X-ray at bedside

## 2016-10-06 NOTE — ED Notes (Signed)
Patient transported to CT 

## 2016-10-06 NOTE — ED Triage Notes (Signed)
Patient presents to the ED for coughing up blood occasionally.  Patient reports he has been having body aches and chills off and on for 2 weeks.  Patient is on oxygen 2L at all times.

## 2016-10-06 NOTE — ED Provider Notes (Signed)
[facility] Emergency Department Provider Note ____________________________________________   I have reviewed the triage vital signs and the triage nursing note.  HISTORY  Chief Complaint Hemoptysis and Generalized Body Aches   Historian Patient  HPI Kevin Pearson is a 66 y.o. male with a history of atrial fibrillation, CHF, COPD, on Xarelto, living with his daughter over the last 4 months since he moved from [place], presents for coughing up blood. Overall small amount, twice overnight. It also happened several times in last several weeks. No abdominal pain. No vomiting blood. No black or bloody stools. No abdominal pain. No chest pain. No pain with breathing. Occasional cough. No wheezing.    Past Medical History:  Diagnosis Date  . Acid reflux   . Anxiety   . Arthritis   . Atrial fibrillation (HCC)   . CHF (congestive heart failure) (HCC)   . Chronic orthostatic hypotension   . Clotting disorder (HCC)   . COPD (chronic obstructive pulmonary disease) (HCC)   . Depression   . Elevated PSA   . Heart attack   . Heart disease   . Heart failure (HCC)   . Hepatitis   . High cholesterol   . Hypertension   . Sleep apnea   . Urinary retention     Patient Active Problem List   Diagnosis Date Noted  . Chronic bilateral low back pain 08/27/2016  . H/O hypotension 07/29/2016  . Chronic obstructive pulmonary disease (HCC) 07/16/2016  . Chronic systolic congestive heart failure (HCC) 07/16/2016  . Peripheral neuropathy (HCC) 07/16/2016  . Anxiety and depression 07/16/2016  . Arthralgia of right foot 07/16/2016  . Urethral stricture 07/16/2016  . Urinary retention 06/24/2016    Past Surgical History:  Procedure Laterality Date  . BLADDER SURGERY    . cardiac stents    . CYSTOSCOPY WITH URETHRAL DILATATION N/A 06/25/2016   Procedure: CYSTOSCOPY WITH URETHRAL DILATATION;  Surgeon: Vanna Scotland, MD;  Location: [facility] ORS;  Service: Urology;  Laterality:  N/A;  . EP IMPLANTABLE DEVICE     St[employer] Jude BiV-ICD  . GREEN LIGHT LASER TURP (TRANSURETHRAL RESECTION OF PROSTATE  2016   done in [place]     Prior to Admission medications   Medication Sig Start Date End Date Taking? Authorizing Provider  acetaminophen (TYLENOL) 325 MG tablet Take 650 mg by mouth every 6 (six) hours as needed.   Yes Historical Provider, MD  albuterol (PROVENTIL HFA;VENTOLIN HFA) 108 (90 Base) MCG/ACT inhaler Inhale 2 puffs into the lungs every 4 (four) hours as needed for shortness of breath.   Yes Historical Provider, MD  budesonide (PULMICORT) 0.5 MG/2ML nebulizer solution Inhale 2 mLs into the lungs daily. 09/26/16  Yes Historical Provider, MD  carvedilol (COREG) 6.25 MG tablet Take 6.25 mg by mouth 2 (two) times daily.   Yes Historical Provider, MD  clonazePAM (KLONOPIN) 1 MG tablet Take 0.5 mg by mouth 3 (three) times daily.    Yes Historical Provider, MD  clopidogrel (PLAVIX) 75 MG tablet Take 75 mg by mouth daily.   Yes Historical Provider, MD  divalproex (DEPAKOTE ER) 500 MG 24 hr tablet Take 500-750 mg by mouth 2 (two) times daily. Take 500 mg in the morning and 750 mg at night.   Yes Historical Provider, MD  furosemide (LASIX) 40 MG tablet Take 40 mg by mouth 2 (two) times daily.   Yes Historical Provider, MD  gabapentin (NEURONTIN) 300 MG capsule Take 300-600 mg by mouth 3 (three) times daily. Take 300  mg in the morning, 300 mg at 2 pm and 300 mg at night.   Yes Historical Provider, MD  hydrALAZINE (APRESOLINE) 25 MG tablet Take 25 mg by mouth 3 (three) times daily.   Yes Historical Provider, MD  ipratropium-albuterol (DUONEB) 0.5-2.5 (3) MG/3ML SOLN Take 3 mLs by nebulization every 6 (six) hours as needed (wheezing/shortness of breath).   Yes Historical Provider, MD  losartan (COZAAR) 50 MG tablet Take 50 mg by mouth daily.   Yes Historical Provider, MD  Melatonin 1 MG TABS Take 6 tablets by mouth at bedtime.    Yes Historical Provider, MD  nitroGLYCERIN (NITROSTAT) 0.4  MG SL tablet Place 0.4 mg under the tongue every 5 (five) minutes as needed for chest pain.   Yes Historical Provider, MD  omeprazole (PRILOSEC) 40 MG capsule Take 40 mg by mouth daily.   Yes Historical Provider, MD  potassium chloride SA (K-DUR,KLOR-CON) 20 MEQ tablet Take 20 mEq by mouth 2 (two) times daily.   Yes Historical Provider, MD  rivaroxaban (XARELTO) 20 MG TABS tablet Take 20 mg by mouth at bedtime.   Yes Historical Provider, MD  rosuvastatin (CRESTOR) 20 MG tablet Take 20 mg by mouth daily.   Yes Historical Provider, MD  tamsulosin (FLOMAX) 0.4 MG CAPS capsule TAKE ONE CAPSULE BY MOUTH EVERY DAY AFTER SUPPER 07/24/16  Yes Vanna Scotland, MD  traMADol (ULTRAM) 50 MG tablet Take 50 mg by mouth every 6 (six) hours as needed for moderate pain or severe pain.    Yes Historical Provider, MD  traZODone (DESYREL) 100 MG tablet Take 100 mg by mouth at bedtime. 09/23/16  Yes Historical Provider, MD  dicyclomine (BENTYL) 10 MG capsule Take 1 capsule (10 mg total) by mouth 3 (three) times daily as needed for spasms. Patient not taking: Reported on 10/06/2016 07/02/16 07/16/16  Charlynne Pander, MD  Hyoscyamine Sulfate SL (LEVSIN/SL) 0.125 MG SUBL Place 1 tablet under the tongue daily. 07/07/16   Harle Battiest, PA-C    Allergies  Allergen Reactions  . Other Anaphylaxis and Itching    Mango skin  . Codeine Nausea And Vomiting    Family History  Problem Relation Age of Onset  . Other Father     Cerebral hemorrhage  . Kidney cancer Neg Hx   . Kidney disease Neg Hx   . Prostate cancer Neg Hx     Social History Social History  Substance Use Topics  . Smoking status: Former Smoker    Types: Cigarettes    Quit date: 08/2015  . Smokeless tobacco: Never Used  . Alcohol use Yes    Review of Systems  Constitutional: Negative for fever. Eyes: Negative for visual changes. ENT: Negative for sore throat. Cardiovascular: Negative for chest pain. Respiratory: Negative for shortness of  breath. Gastrointestinal: Negative for abdominal pain, vomiting and diarrhea. Genitourinary: Negative for dysuria. Musculoskeletal: Negative for back pain. Skin: Negative for rash. Neurological: Negative for headache. 10 point Review of Systems otherwise negative ____________________________________________   PHYSICAL EXAM:  VITAL SIGNS: ED Triage Vitals  Enc Vitals Group     BP 10/06/16 0814 127/67     Pulse Rate 10/06/16 0814 71     Resp 10/06/16 0814 16     Temp 10/06/16 0814 97.7 F (36.5 C)     Temp Source 10/06/16 0814 Oral     SpO2 10/06/16 0814 96 %     Weight 10/06/16 0815 250 lb (113.4 kg)     Height 10/06/16 0815  (  1.727 m)     Head Circumference --      Peak Flow --      Pain Score 10/06/16 0831 10     Pain Loc --      Pain Edu? --      Excl. in GC? --      Constitutional: Alert and oriented. Well appearing and in no distress. HEENT   Head: Normocephalic and atraumatic.      Eyes: Conjunctivae are normal. PERRL. Normal extraocular movements.      Ears:         Nose: No congestion/rhinnorhea.   Mouth/Throat: Mucous membranes are moist.   Neck: No stridor. Cardiovascular/Chest: Normal rate, regular rhythm.  No murmurs, rubs, or gallops. Respiratory: Normal respiratory effort without tachypnea nor retractions. Breath sounds are clear and equal bilaterally. No wheezes/rales/rhonchi. Gastrointestinal: Soft. No distention, no guarding, no rebound. Nontender.  Genitourinary/rectal:Deferred Musculoskeletal: Nontender with normal range of motion in all extremities. No joint effusions.  No lower extremity tenderness.  No edema. Neurologic:  Normal speech and language. No gross or focal neurologic deficits are appreciated. Skin:  Skin is warm, dry and intact. No rash noted. Psychiatric: Mood and affect are normal. Speech and behavior are normal. Patient exhibits appropriate insight and judgment.   ____________________________________________  LABS  (pertinent positives/negatives)  Labs Reviewed  CBC WITH DIFFERENTIAL/PLATELET - Abnormal; Notable for the following:       Result Value   WBC 13.5 (*)    Hemoglobin 11.7 (*)    HCT 36.6 (*)    RDW 17.6 (*)    Neutro Abs 10.1 (*)    Monocytes Absolute 1.3 (*)    All other components within normal limits  COMPREHENSIVE METABOLIC PANEL - Abnormal; Notable for the following:    Glucose, Bld 135 (*)    BUN 23 (*)    Creatinine, Ser 1.26 (*)    Albumin 3.4 (*)    ALT 9 (*)    GFR calc non Af Amer 59 (*)    All other components within normal limits  PROTIME-INR - Abnormal; Notable for the following:    Prothrombin Time 16.4 (*)    All other components within normal limits  APTT    ____________________________________________    EKG I, Governor Rooks, MD, the attending physician have personally viewed and interpreted all ECGs.  None ____________________________________________  RADIOLOGY All Xrays were viewed by me. Imaging interpreted by Radiologist.  CXR portable:  IMPRESSION: Clear lungs.  No explanation for hemoptysis.  cT chest with for pe:   IMPRESSION: 1. No CT evidence for acute pulmonary embolus. 2. 15 mm nodular focus posterior left lower lobe is most likely atelectatic. Follow-up CT chest without contrast in 3 months is recommended to reassess. 3. Question full thickening and nondistended stomach, but not a definite finding. Upper GI series could be used to further evaluate, as clinically warranted. __________________________________________  PROCEDURES  Procedure(s) performed: None  Critical Care performed: None  ____________________________________________   ED COURSE / ASSESSMENT AND PLAN  Pertinent labs & imaging results that were available during my care of the patient were reviewed by me and considered in my medical decision making (see chart for details).   Mr. Oleski to is here for what sounds like a small amount of recurrent hemoptysis. He  is on Xarelto. No additional symptoms in terms of a pleuritic chest pain, chest pain, or trouble breathing.  Does not sound like this is hematemesis, but hemoptysis.  Chest x-ray is clear. Vital signs  are reassuring. The first is reassuring.  I discussed with her obtaining a chest CT with 20 to proceed.  Chest CT shows no pulmonary embolism. No additional source for hemoptysis.  He is on Xarelto for history of A. fib he also has multiple stents in by coronary artery disease. I discussed with him that this point not recommending that he stop it given small amount, reassuring evaluation and I'm more concerned about stroke and heart attack if we stop it.  I've recommended close follow up with cardiologist and pulmonologist as well as primary care doctor for follow up.   CONSULTATIONS:   None   Patient / Family / Caregiver informed of clinical course, medical decision-making process, and agree with plan.   I discussed return precautions, follow-up instructions, and discharge instructions with patient and/or family.  Discharge instructions:  You evaluated for coughing up blood, and your exam and evaluation are reassuring in the emergency department today.  As we discussed, no certain cause was found, but suspect bronchitis or inflammation while you're on the blood thinner Xarelto.  Return to emergency department immediately for any worsening amount of coughing up blood, any trouble breathing, any pain with breathing, vomiting blood, dizziness or passing out, or any other symptoms concerning to you. ___________________________________________   FINAL CLINICAL IMPRESSION(S) / ED DIAGNOSES   Final diagnoses:  Hemoptysis  Pulmonary nodule, left              Note: This dictation was prepared with Dragon dictation. Any transcriptional errors that result from this process are unintentional    Governor Rooks, MD 10/06/16 1358

## 2016-10-08 ENCOUNTER — Encounter: Payer: BLUE CROSS/BLUE SHIELD | Admitting: *Deleted

## 2016-10-08 DIAGNOSIS — I5022 Chronic systolic (congestive) heart failure: Secondary | ICD-10-CM

## 2016-10-08 NOTE — Progress Notes (Signed)
Daily Session Note  Patient Details  Name: Kevin Pearson MRN: [id number] Date of Birth: 1951/01/18 Referring Provider:     Cardiac Rehab from 08/03/2016 in [facility] Cardiac and Pulmonary Rehab  Referring Provider  Peggye Form MD      Encounter Date: 10/08/2016  Check In:     Session Check In - 10/08/16 0840      Check-In   Location [facility]-Cardiac & Pulmonary Rehab   Staff Present Alberteen Sam, MA, ACSM RCEP, Exercise Physiologist;Susanne Bice, RN, BSN, Lance Sell, BA, ACSM CEP, Exercise Physiologist   Supervising physician immediately available to respond to emergencies See telemetry face sheet for immediately available ER MD   Medication changes reported     No   Fall or balance concerns reported    No   Warm-up and Cool-down Performed on first and last piece of equipment   Resistance Training Performed Yes   VAD Patient? No     Pain Assessment   Currently in Pain? No/denies   Multiple Pain Sites No         History  Smoking Status  . Former Smoker  . Types: Cigarettes  . Quit date: 08/2015  Smokeless Tobacco  . Never Used    Goals Met:  Independence with exercise equipment Exercise tolerated well No report of cardiac concerns or symptoms Strength training completed today  Goals Unmet:  Not Applicable  Comments: Pt able to follow exercise prescription today without complaint.  Will continue to monitor for progression.    Dr. Emily Filbert is Medical Director for [place] and [name].

## 2016-10-13 ENCOUNTER — Encounter: Payer: BLUE CROSS/BLUE SHIELD | Admitting: Respiratory Therapy

## 2016-10-13 DIAGNOSIS — I5022 Chronic systolic (congestive) heart failure: Secondary | ICD-10-CM

## 2016-10-13 NOTE — Progress Notes (Signed)
Daily Session Note  Patient Details  Name: Barron Vanloan MRN: [id number] Date of Birth: 1950/09/23 Referring Provider:     Cardiac Rehab from 08/03/2016 in [facility] Cardiac and Pulmonary Rehab  Referring Provider  Peggye Form MD      Encounter Date: 10/13/2016  Check In:     Session Check In - 10/13/16 0838      Check-In   Location [facility]-Cardiac & Pulmonary Rehab   Staff Present Alberteen Sam, MA, ACSM RCEP, Exercise Physiologist;Susanne Bice, RN, BSN, CCRP;Laureen Owens Shark, BS, RRT, Respiratory Therapist   Supervising physician immediately available to respond to emergencies See telemetry face sheet for immediately available ER MD   Medication changes reported     No   Fall or balance concerns reported    No   Tobacco Cessation No Change   Warm-up and Cool-down Performed on first and last piece of equipment   Resistance Training Performed Yes   VAD Patient? No     Pain Assessment   Currently in Pain? No/denies   Multiple Pain Sites No         History  Smoking Status   Former Smoker   Types: Cigarettes   Quit date: 08/2015  Smokeless Tobacco   Never Used    Goals Met:  Proper associated with RPD/PD & O2 Sat Independence with exercise equipment Exercise tolerated well No report of cardiac concerns or symptoms Strength training completed today  Goals Unmet:  Not Applicable  Comments: Pt able to follow exercise prescription today without complaint.  Will continue to monitor for progression.   Dr. Emily Filbert is Medical Director for [place] and [name].

## 2016-10-15 ENCOUNTER — Encounter: Payer: BLUE CROSS/BLUE SHIELD | Admitting: *Deleted

## 2016-10-15 DIAGNOSIS — I5022 Chronic systolic (congestive) heart failure: Secondary | ICD-10-CM

## 2016-10-15 NOTE — Progress Notes (Signed)
Daily Session Note  Patient Details  Name: Kevin Pearson MRN: [id number] Date of Birth: [birth year] Referring Provider:     Cardiac Rehab from 08/03/2016 in [facility] Cardiac and Pulmonary Rehab  Referring Provider  Peggye Form MD      Encounter Date: 10/15/2016  Check In:     Session Check In - 10/15/16 1016      Check-In   Location [facility]-Cardiac & Pulmonary Rehab   Staff Present Alberteen Sam, MA, ACSM RCEP, Exercise Physiologist;Susanne Bice, RN, BSN, Lance Sell, BA, ACSM CEP, Exercise Physiologist   Supervising physician immediately available to respond to emergencies See telemetry face sheet for immediately available ER MD   Medication changes reported     No   Fall or balance concerns reported    No   Warm-up and Cool-down Performed on first and last piece of equipment   Resistance Training Performed Yes   VAD Patient? No     Pain Assessment   Currently in Pain? No/denies   Multiple Pain Sites No         History  Smoking Status  . Former Smoker  . Types: Cigarettes  . Quit date: 08/2015  Smokeless Tobacco  . Never Used    Goals Met:  Proper associated with RPD/PD & O2 Sat Independence with exercise equipment Exercise tolerated well No report of cardiac concerns or symptoms Strength training completed today  Goals Unmet:  Not Applicable  Comments: Pt able to follow exercise prescription today without complaint.  Will continue to monitor for progression.     [place] Name 08/03/16 1533 10/15/16 1017       6 Minute Walk   Phase Initial Discharge    Distance 806 feet 1058 feet    Distance % Change  - 23.8 %  252 ft    Walk Time 5.5 minutes 6 minutes    # of Rest Breaks 1  30 sec 0    MPH 1.66 2    METS 2.23 2.53    RPE 16 14    Perceived Dyspnea  3 3    VO2 Peak 7.08 6.88    Symptoms Yes (comment)  -    Comments SOB used oxygen tank pulling    Resting HR 69 bpm 75 bpm    Resting BP 148/72 112/60    Max Ex. HR 119  bpm 94 bpm    Max Ex. BP 156/80 134/64    2 Minute Post BP 126/70  -      Interval HR   Baseline HR 69 75    1 Minute HR 79 91    2 Minute HR 80 55    3 Minute HR 82 57    4 Minute HR 109 94    5 Minute HR 98 91    6 Minute HR 97 62    2 Minute Post HR 84  -    Interval Heart Rate? Yes Yes      Interval Oxygen   Interval Oxygen? Yes Yes    Baseline Oxygen Saturation % 100 % 99 %    Baseline Liters of Oxygen 2 L 2 L    1 Minute Oxygen Saturation % 97 % 98 %    1 Minute Liters of Oxygen 2 L 2 L    2 Minute Oxygen Saturation % 96 %  rest break 2:45-3:15 99 %    2 Minute Liters of Oxygen 2 L 2 L    3  Minute Oxygen Saturation % 100 % 97 %    3 Minute Liters of Oxygen 2 L 2 L    4 Minute Oxygen Saturation % 97 % 98 %    4 Minute Liters of Oxygen 2 L 2 L    5 Minute Oxygen Saturation % 98 % 97 %    5 Minute Liters of Oxygen 2 L 2 L    6 Minute Oxygen Saturation % 96 % 98 %    6 Minute Liters of Oxygen 2 L 2 L    2 Minute Post Oxygen Saturation % 100 %  -    2 Minute Post Liters of Oxygen 2 L  -         Dr. Emily Filbert is Medical Director for [place] and [name].

## 2016-10-16 ENCOUNTER — Ambulatory Visit: Payer: Self-pay | Admitting: Urology

## 2016-10-16 ENCOUNTER — Telehealth: Payer: Self-pay | Admitting: Urology

## 2016-10-16 NOTE — Telephone Encounter (Signed)
appt has been made and I spoke with Kevin Pearson his daughter   Kevin Pearson

## 2016-10-16 NOTE — Telephone Encounter (Signed)
Called by Dr. Cherylann Ratel. The patient is having intermittent gross hematuria as well as microscopic hematuria which is likely related to his bladder neck contracture. As such, we'll plan to scope his bladder to rule out any underlying pathology as his bladder was not fully evaluated the time of urethral dilation.  Vanna Scotland, MD

## 2016-10-20 ENCOUNTER — Ambulatory Visit: Payer: BLUE CROSS/BLUE SHIELD | Attending: Neurology | Admitting: Speech Pathology

## 2016-10-20 ENCOUNTER — Encounter: Payer: Self-pay | Admitting: Speech Pathology

## 2016-10-20 ENCOUNTER — Encounter: Payer: BLUE CROSS/BLUE SHIELD | Admitting: Respiratory Therapy

## 2016-10-20 DIAGNOSIS — I5022 Chronic systolic (congestive) heart failure: Secondary | ICD-10-CM | POA: Diagnosis not present

## 2016-10-20 DIAGNOSIS — R41841 Cognitive communication deficit: Secondary | ICD-10-CM

## 2016-10-20 NOTE — Progress Notes (Signed)
Daily Session Note  Patient Details  Name: Kevin Pearson MRN: [id number] Date of Birth: July 31, 1950 Referring Provider:     Cardiac Rehab from 08/03/2016 in [facility] Cardiac and Pulmonary Rehab  Referring Provider  Peggye Form MD      Encounter Date: 10/20/2016  Check In:     Session Check In - 10/20/16 0834      Check-In   Location [facility]-Cardiac & Pulmonary Rehab   Staff Present Alberteen Sam, MA, ACSM RCEP, Exercise Physiologist;Susanne Bice, RN, BSN, CCRP;Laureen Owens Shark, BS, RRT, Respiratory Therapist   Supervising physician immediately available to respond to emergencies See telemetry face sheet for immediately available ER MD   Medication changes reported     No   Fall or balance concerns reported    No   Tobacco Cessation No Change   Warm-up and Cool-down Performed on first and last piece of equipment   Resistance Training Performed Yes   VAD Patient? No     Pain Assessment   Currently in Pain? No/denies   Multiple Pain Sites No         History  Smoking Status   Former Smoker   Types: Cigarettes   Quit date: 08/2015  Smokeless Tobacco   Never Used    Goals Met:  Proper associated with RPD/PD & O2 Sat Independence with exercise equipment Exercise tolerated well No report of cardiac concerns or symptoms Strength training completed today  Goals Unmet:  Not Applicable  Comments: Pt able to follow exercise prescription today without complaint.  Will continue to monitor for progression.   Dr. Emily Filbert is Medical Director for [place] and [name].

## 2016-10-20 NOTE — Therapy (Signed)
[place] [facility] MAIN [facility] SERVICES [address] [place], [place], [address] Phone: [phone]   Fax:  [phone]  Speech Language Pathology Evaluation  Patient Details  Name: Kevin Pearson MRN: [id number] Date of Birth: Nov 28, 1950 Referring Provider: Dr. Cristopher Peru  Encounter Date: 10/20/2016      End of Session - 10/20/16 1308    Visit Number 1   Number of Visits 13   Date for SLP Re-Evaluation 12/01/16   SLP Start Time 1015   SLP Stop Time  1104   SLP Time Calculation (min) 49 min   Activity Tolerance Patient tolerated treatment well      Past Medical History:  Diagnosis Date  . Acid reflux   . Anxiety   . Arthritis   . Atrial fibrillation (HCC)   . CHF (congestive heart failure) (HCC)   . Chronic orthostatic hypotension   . Clotting disorder (HCC)   . COPD (chronic obstructive pulmonary disease) (HCC)   . Depression   . Elevated PSA   . Heart attack (HCC)   . Heart disease   . Heart failure (HCC)   . Hepatitis   . High cholesterol   . Hypertension   . Sleep apnea   . Urinary retention     Past Surgical History:  Procedure Laterality Date  . BLADDER SURGERY    . cardiac stents    . CYSTOSCOPY WITH URETHRAL DILATATION N/A 06/25/2016   Procedure: CYSTOSCOPY WITH URETHRAL DILATATION;  Surgeon: Vanna Scotland, MD;  Location: [facility] ORS;  Service: Urology;  Laterality: N/A;  . EP IMPLANTABLE DEVICE     St. Jude BiV-ICD  . GREEN LIGHT LASER TURP (TRANSURETHRAL RESECTION OF PROSTATE  2016   done in [place]     There were no vitals filed for this visit.      Subjective Assessment - 10/20/16 1225    Subjective Pt was pleasant and cooperative. Had just completed exercise session with cardiac rehab and was a little late for evaluation.    Currently in Pain? No/denies            SLP Evaluation [facility] - 10/20/16 1225      SLP Visit Information   SLP Received On 10/20/16   Referring Provider Dr. Cristopher Peru   Onset Date  08/2015   Medical Diagnosis Cognitive impairment     Subjective   Subjective Pt was pleasant and cooperative. Agreeable to evaluation.   Patient/Family Stated Goal To help memory     Pain Assessment   Pain Score 0-No pain     General Information   HPI Pt is a 66 y/o male with a h/o COPD, congestive heart failure, obstructive sleep apnea and kidney disease. Pt fell in 08/2015 and was not found for 30 hours. Pt had broken ribs and severe confusion after the fall which have improved. However pt continues to have difficulty with memory. Per MD noted pt has cognitive impairment d/t likely anoxic ischemia from fall plus a h/o TBI, long standing COPD, chronic pain with pain medications and benzodiazapine use and history of heavy alcohol use and polysubstance use.    Mobility Status ambulatory     Prior Functional Status   Cognitive/Linguistic Baseline Information not available   Type of Home House    Lives With Daughter   Education 12th grade   Vocation On disability     Cognition   Overall Cognitive Status Impaired/Different from baseline   Area of Impairment Memory  Executive function  Memory Decreased short-term memory   Memory Comments Pt demonstrated deficits in recall of information over time.    Attention --  Pt demonstrated good focused and divided attention   Memory Impaired   Memory Impairment Decreased short term memory;Decreased recall of new information   Decreased Short Term Memory Functional basic;Verbal basic   Awareness Appears intact   Problem Solving Appears intact   Executive Function Organizing;Reasoning   Reasoning Impaired   Organizing Impaired   Organizing Impairment --  Visuoconstructional skills and verbal fluency   Behaviors Other (comment)  tangential speech and thought process     Auditory Comprehension   Overall Auditory Comprehension Appears within functional limits for tasks assessed   Yes/No Questions Within Functional Limits   Commands Within  Functional Limits   Conversation Complex   Other Conversation Comments Pt demonstrated good auditory comprehension in consversation   Overall Auditory Comprehension Comments [facility]     Visual Recognition/Discrimination   Discrimination Not tested     Reading Comprehension   Reading Status Not tested     Expression   Primary Mode of Expression Verbal     Verbal Expression   Overall Verbal Expression Appears within functional limits for tasks assessed   Initiation No impairment   Level of Generative/Spontaneous Verbalization Conversation   Repetition No impairment   Naming No impairment   Pragmatics Unable to assess   Other Verbal Expression Comments Pt demonstrated fluent, tangential speech. Pt also noted to make frequent interjections. Pt needed cues at times to return to original subject.      Written Expression   Written Expression Not tested     Oral Motor/Sensory Function   Overall Oral Motor/Sensory Function Appears within functional limits for tasks assessed   Overall Oral Motor/Sensory Function WFL     Motor Speech   Overall Motor Speech Appears within functional limits for tasks assessed     Standardized Assessments   Standardized Assessments  Other Assessment   Montreal Cognitive Assessment (MOCA)  Visuospatial/Executive- 3/5 (Difficulty with cube copying and completing hour and minute hand on clock drawing); Naming - 3/3; Memory (immediate) 5/5; Attention - 6/6; Language - Repetition - 2/2 and Fluency - 0/1 (Pt only able to name five items in 60 seconds; Abstraction - 2/2; Delayed recall - 3/5; Orientation - 6/6. Overall Score - 25/60 (Normal >26/30)   Other Assessment Western Aphasia Battery Screening - Pt scored WFL for Spontanesous speech content and fluency; Auditory Verbal comprehension. Unable to complete Naming, reading and writing tasks d/t time constraints.                          SLP Education - 10/20/16 1307    Education provided Yes    Education Details SLP role in treating cognitive impairment   Person(s) Educated Patient   Methods Explanation   Comprehension Need further instruction;Verbalized understanding            SLP Long Term Goals - 10/20/16 1319      SLP LONG TERM GOAL #1   Title Patient will demonstrate functional use of external memory aids with 80% acc within structured settins.    Baseline 0   Time 6   Period Weeks   Status New     SLP LONG TERM GOAL #2   Title Pt will demonstrate functional cognitive-communication skills for independent completion of personal responsibilities.    Baseline 0   Time 6   Period Weeks   Status  New     SLP LONG TERM GOAL #3   Title Pt wil complete executive function skills tasks with 80% acc.    Baseline 0   Time 6   Period Weeks   Status New     SLP LONG TERM GOAL #4   Title Patient will complete memory strategy activities with 80% acc.    Baseline 0   Time 6   Period Weeks   Status New          Plan - Oct 24, 2016 1309    Clinical Impression Statement This 66 y/o male with a history of COPD, CHF, obstructive sleep apnea, kidney disease, alcohol and polysubstance abuse, and anoxic brain injury following a fall, presents w/mild cognitive-linuguistic disorder. Pt's demonstrated deficits in the areas of short term recall and executive functioning. Pt also demonstrates fluent, but tangential speech with frequent interjections. Pt required cues to maintain subject and would occasionally needs reminders; which can interfere with conveying meaning. Pt reports that he has not been able to live independently, participate in work,, manage his schedule or his medications since onset of deficits. Pt would benefit from skilled SLP therapy to establish internal and external memory aids and to establish cognitive exercises for executive function skills.    Speech Therapy Frequency 2x / week   Duration Other (comment)  6 weeks   Treatment/Interventions Functional  tasks;Cognitive reorganization;SLP instruction and feedback;Compensatory strategies;Patient/family education;Internal/external aids   Potential to Achieve Goals Good   Potential Considerations Family/community support;Co-morbidities   Consulted and Agree with Plan of Care Patient      Patient will benefit from skilled therapeutic intervention in order to improve the following deficits and impairments:   Cognitive communication deficit - Plan: SLP PLAN OF CARE CERT/RE-CERT      G-Codes - 2016/10/24 1324    Functional Assessment Tool Used Clinical judgement; MOCA   Functional Limitations Memory   Memory Current Status (Z6109) At least 1 percent but less than 20 percent impaired, limited or restricted   Memory Goal Status (U0454) At least 1 percent but less than 20 percent impaired, limited or restricted      Problem List Patient Active Problem List   Diagnosis Date Noted  . Chronic bilateral low back pain 08/27/2016  . H/O hypotension 07/29/2016  . Chronic obstructive pulmonary disease (HCC) 07/16/2016  . Chronic systolic congestive heart failure (HCC) 07/16/2016  . Peripheral neuropathy 07/16/2016  . Anxiety and depression 07/16/2016  . Arthralgia of right foot 07/16/2016  . Urethral stricture 07/16/2016  . Urinary retention 06/24/2016    Hunter,Maaran 10/24/16, 1:28 PM  [place] [facility] MAIN [facility] SERVICES [address] [place], [place], [address] Phone: [phone]   Fax:  [phone]  Name: Quinlan Vollmer MRN: [id number] Date of Birth: [birth year]

## 2016-10-22 ENCOUNTER — Ambulatory Visit: Payer: BLUE CROSS/BLUE SHIELD | Admitting: Speech Pathology

## 2016-10-22 DIAGNOSIS — R41841 Cognitive communication deficit: Secondary | ICD-10-CM

## 2016-10-22 DIAGNOSIS — I5022 Chronic systolic (congestive) heart failure: Secondary | ICD-10-CM | POA: Diagnosis not present

## 2016-10-22 NOTE — Patient Instructions (Signed)
Discharge Progress Report  Patient Details  Name: Kevin Pearson MRN: [id number] Date of Birth: 02-Jul-1951 Referring Provider:  Marcina Millard, MD   Number of Visits: 36/36  Reason for Discharge:  Patient reached a stable level of exercise.  Smoking History:  History  Smoking Status  . Former Smoker  . Types: Cigarettes  . Quit date: 08/2015  Smokeless Tobacco  . Never Used    Diagnosis:  Heart failure, chronic systolic (HCC)  Initial Exercise Prescription:     Initial Exercise Prescription - 08/03/16 1500      Date of Initial Exercise RX and Referring Provider   Date 08/03/16   Referring Provider Cephas Darby MD     Oxygen   Oxygen Continuous   Liters 2     Treadmill   MPH 1.3   Grade 0   Minutes 15   METs 2     NuStep   Level 1   Minutes 15   METs 2     Biostep-RELP   Level 2   Minutes 15   METs 2     Prescription Details   Frequency (times per week) 2   Duration Progress to 45 minutes of aerobic exercise without signs/symptoms of physical distress     Intensity   THRR 40-80% of Max Heartrate 104-139   Ratings of Perceived Exertion 11-15   Perceived Dyspnea 0-4     Progression   Progression Continue to progress workloads to maintain intensity without signs/symptoms of physical distress.     Resistance Training   Training Prescription Yes   Weight 3 lbs   Reps 10-15      Discharge Exercise Prescription (Final Exercise Prescription Changes):     Exercise Prescription Changes - 10/08/16 1400      Response to Exercise   Blood Pressure (Admit) 126/66   Blood Pressure (Exercise) 120/58   Blood Pressure (Exit) 124/72   Heart Rate (Admit) 70 bpm   Heart Rate (Exercise) 113 bpm   Heart Rate (Exit) 70 bpm   Rating of Perceived Exertion (Exercise) 12   Symptoms none   Duration Continue with 45 min of aerobic exercise without signs/symptoms of physical distress.   Intensity THRR unchanged     Progression   Progression  Continue to progress workloads to maintain intensity without signs/symptoms of physical distress.   Average METs 1.97     Resistance Training   Training Prescription Yes   Weight 3 lbs   Reps 10-15     Interval Training   Interval Training No     Oxygen   Oxygen Continuous   Liters 2     Treadmill   MPH 1.8   Grade 0.5   Minutes 15   METs 2.5     NuStep   Level 5   Minutes 15   METs 2.4     Biostep-RELP   Level 2   Minutes 15   METs 1     Home Exercise Plan   Plans to continue exercise at [employer] (comment)  [employer] and walking   Frequency Add 3 additional days to program exercise sessions.   Initial Home Exercises Provided 09/10/16      Functional Capacity:     6 Minute Walk    Row Name 08/03/16 1533 10/15/16 1017       6 Minute Walk   Phase Initial Discharge    Distance 806 feet 1058 feet    Distance % Change  - 23.8 %  252  ft    Walk Time 5.5 minutes 6 minutes    # of Rest Breaks 1  30 sec 0    MPH 1.66 2    METS 2.23 2.53    RPE 16 14    Perceived Dyspnea  3 3    VO2 Peak 7.08 6.88    Symptoms Yes (comment)  -    Comments SOB used oxygen tank pulling    Resting HR 69 bpm 75 bpm    Resting BP 148/72 112/60    Max Ex. HR 119 bpm 94 bpm    Max Ex. BP 156/80 134/64    2 Minute Post BP 126/70  -      Interval HR   Baseline HR 69 75    1 Minute HR 79 91    2 Minute HR 80 55    3 Minute HR 82 57    4 Minute HR 109 94    5 Minute HR 98 91    6 Minute HR 97 62    2 Minute Post HR 84  -    Interval Heart Rate? Yes Yes      Interval Oxygen   Interval Oxygen? Yes Yes    Baseline Oxygen Saturation % 100 % 99 %    Baseline Liters of Oxygen 2 L 2 L    1 Minute Oxygen Saturation % 97 % 98 %    1 Minute Liters of Oxygen 2 L 2 L    2 Minute Oxygen Saturation % 96 %  rest break 2:45-3:15 99 %    2 Minute Liters of Oxygen 2 L 2 L    3 Minute Oxygen Saturation % 100 % 97 %    3 Minute Liters of Oxygen 2 L 2 L    4 Minute Oxygen  Saturation % 97 % 98 %    4 Minute Liters of Oxygen 2 L 2 L    5 Minute Oxygen Saturation % 98 % 97 %    5 Minute Liters of Oxygen 2 L 2 L    6 Minute Oxygen Saturation % 96 % 98 %    6 Minute Liters of Oxygen 2 L 2 L    2 Minute Post Oxygen Saturation % 100 %  -    2 Minute Post Liters of Oxygen 2 L  -       Quality of Life:     Quality of Life - 10/08/16 0838      Quality of Life Scores   Health/Function Pre 8.17 %   Health/Function Post 18.5 %   Health/Function % Change 126.44 %   Socioeconomic Pre 8.43 %   Socioeconomic Post 17.5 %   Socioeconomic % Change  107.59 %   Psych/Spiritual Pre 10.14 %   Psych/Spiritual Post 21.07 %   Psych/Spiritual % Change 107.79 %   Family Pre 12.7 %   Family Post 23.1 %   Family % Change 81.89 %   GLOBAL Pre 9.29 %   GLOBAL Post 19.53 %   GLOBAL % Change 110.23 %      Personal Goals: Goals established at orientation with interventions provided to work toward goal.     Personal Goals and Risk Factors at Admission - 08/03/16 1311      Core Components/Risk Factors/Patient Goals on Admission    Weight Management Yes;Obesity;Weight Loss   Admit Weight 264 lb 3.2 oz (119.8 kg)   Goal Weight: Short Term 260 lb (117.9 kg)  Goal Weight: Long Term 180 lb (81.6 kg)   Expected Outcomes Short Term: Continue to assess and modify interventions until short term weight is achieved;Long Term: Adherence to nutrition and physical activity/exercise program aimed toward attainment of established weight goal;Weight Loss: Understanding of general recommendations for a balanced deficit meal plan, which promotes 1-2 lb weight loss per week and includes a negative energy balance of [phone] kcal/d;Understanding recommendations for meals to include 15-35% energy as protein, 25-35% energy from fat, 35-60% energy from carbohydrates, less than  of dietary cholesterol, 20-35 gm of total fiber daily;Understanding of distribution of calorie intake throughout the  day with the consumption of 4-5 meals/snacks   Sedentary Yes   Intervention Provide advice, education, support and counseling about physical activity/exercise needs.;Develop an individualized exercise prescription for aerobic and resistive training based on initial evaluation findings, risk stratification, comorbidities and participant's personal goals.   Expected Outcomes Achievement of increased cardiorespiratory fitness and enhanced flexibility, muscular endurance and strength shown through measurements of functional capacity and personal statement of participant.   Increase Strength and Stamina Yes   Intervention Provide advice, education, support and counseling about physical activity/exercise needs.;Develop an individualized exercise prescription for aerobic and resistive training based on initial evaluation findings, risk stratification, comorbidities and participant's personal goals.   Expected Outcomes Achievement of increased cardiorespiratory fitness and enhanced flexibility, muscular endurance and strength shown through measurements of functional capacity and personal statement of participant.   Improve shortness of breath with ADL's Yes  Has COPD, wears oxygen as prescribed   Intervention Provide education, individualized exercise plan and daily activity instruction to help decrease symptoms of SOB with activities of daily living.   Expected Outcomes Short Term: Achieves a reduction of symptoms when performing activities of daily living.   Heart Failure Yes   Intervention Provide a combined exercise and nutrition program that is supplemented with education, support and counseling about heart failure. Directed toward relieving symptoms such as shortness of breath, decreased exercise tolerance, and extremity edema.   Expected Outcomes Improve functional capacity of life;Short term: Attendance in program 2-3 days a week with increased exercise capacity. Reported lower sodium intake. Reported  increased fruit and vegetable intake. Reports medication compliance.;Short term: Daily weights obtained and reported for increase. Utilizing diuretic protocols set by physician.;Long term: Adoption of self-care skills and reduction of barriers for early signs and symptoms recognition and intervention leading to self-care maintenance.   Hypertension Yes   Intervention Provide education on lifestyle modifcations including regular physical activity/exercise, weight management, moderate sodium restriction and increased consumption of fresh fruit, vegetables, and low fat dairy, alcohol moderation, and smoking cessation.;Monitor prescription use compliance.   Expected Outcomes Short Term: Continued assessment and intervention until BP is < 140/17mm HG in hypertensive participants. < 130/2mm HG in hypertensive participants with diabetes, heart failure or chronic kidney disease.;Long Term: Maintenance of blood pressure at goal levels.   Lipids Yes   Intervention Provide education and support for participant on nutrition & aerobic/resistive exercise along with prescribed medications to achieve LDL 70mg , HDL >40mg .   Expected Outcomes Short Term: Participant states understanding of desired cholesterol values and is compliant with medications prescribed. Participant is following exercise prescription and nutrition guidelines.;Long Term: Cholesterol controlled with medications as prescribed, with individualized exercise RX and with personalized nutrition plan. Value goals: LDL < , HDL > 40 mg.   Stress Yes   Intervention Offer individual and/or small group education and counseling on adjustment to heart disease, stress management and health-related lifestyle change.  Teach and support self-help strategies.;Refer participants experiencing significant psychosocial distress to appropriate mental health specialists for further evaluation and treatment. When possible, include family members and significant others in  education/counseling sessions.   Expected Outcomes Short Term: Participant demonstrates changes in health-related behavior, relaxation and other stress management skills, ability to obtain effective social support, and compliance with psychotropic medications if prescribed.;Long Term: Emotional wellbeing is indicated by absence of clinically significant psychosocial distress or social isolation.       Personal Goals Discharge:     Goals and Risk Factor Review - 10/22/16 1101      Core Components/Risk Factors/Patient Goals Review   Personal Goals Review Weight Management/Obesity;Lipids;Hypertension;Heart Failure   Review Dyquan has throughly enjoyed rehab.  Unfortunately his weight is back up today (total of 12 lbs since beginning of program).  He is having some swelling and shortness of breath as well and plans to call his cardiologist today after his next appointment.  Blood pressures have continued to be good and no changes in his lipids.  He has been out of his calcium for a few days and also plans to talk to the doctor about this as well.   Expected Outcomes Short: Shawntez will graduate from cardiac rehab.  Long: Continue to work on his risk factors.      Nutrition & Weight - Outcomes:     Pre Biometrics - 08/03/16 1540      Pre Biometrics   Height 5' 7.9" (1.725 m)   Weight 264 lb 3.2 oz (119.8 kg)   Waist Circumference 47 inches   Hip Circumference 47 inches   Waist to Hip Ratio 1 %   BMI (Calculated) 40.4   Single Leg Stand 1.89 seconds       Nutrition:     Nutrition Therapy & Goals - 08/27/16 1139      Nutrition Therapy   Diet basic heart healthy   Drug/Food Interactions Statins/Certain Fruits   Protein (specify units) 8-9oz   Fiber 30 grams   Whole Grain Foods 3 servings   Saturated Fats 14 max. grams   Fruits and Vegetables 8 servings/day   Sodium 1500 grams     Personal Nutrition Goals   Nutrition Goal Continue to increase vegetable and fruit intake, as  well as whole grains, nuts, beans, fish.      Intervention Plan   Intervention Prescribe, educate and counsel regarding individualized specific dietary modifications aiming towards targeted core components such as weight, hypertension, lipid management, diabetes, heart failure and other comorbidities.;Nutrition handout(s) given to patient.   Expected Outcomes Short Term Goal: Understand basic principles of dietary content, such as calories, fat, sodium, cholesterol and nutrients.;Short Term Goal: A plan has been developed with personal nutrition goals set during dietitian appointment.;Long Term Goal: Adherence to prescribed nutrition plan.      Nutrition Discharge:     Nutrition Assessments - 10/08/16 0941      MEDFICTS Scores   Pre Score 60   Post Score 42   Score Difference -18      Education Questionnaire Score:     Knowledge Questionnaire Score - 10/08/16 0839      Knowledge Questionnaire Score   Pre Score 25/28  responses reviewed wiht verbal response of understanding   Post Score 26/28      Goals reviewed with patient; copy given to patient.

## 2016-10-22 NOTE — Progress Notes (Signed)
Daily Session Note  Patient Details  Name: Kevin Pearson MRN: [id number] Date of Birth: 05-03-1951 Referring Provider:     Cardiac Rehab from 08/03/2016 in [facility]  Referring Provider  Peggye Form MD      Encounter Date: 10/22/2016  Check In:     Session Check In - 10/22/16 0852      Check-In   Location [facility]-Cardiac & Pulmonary Rehab   Staff Present Alberteen Sam, MA, ACSM RCEP, Exercise Physiologist;Amanda Oletta Darter, BA, ACSM CEP, Exercise Physiologist;Other  Darel Hong RN   Supervising physician immediately available to respond to emergencies See telemetry face sheet for immediately available ER MD   Medication changes reported     No   Fall or balance concerns reported    No   Warm-up and Cool-down Performed on first and last piece of equipment   Resistance Training Performed Yes   VAD Patient? No     Pain Assessment   Currently in Pain? No/denies         History  Smoking Status  . Former Smoker  . Types: Cigarettes  . Quit date: 08/2015  Smokeless Tobacco  . Never Used    Goals Met:  Independence with exercise equipment Exercise tolerated well Personal goals reviewed No report of cardiac concerns or symptoms Strength training completed today  Goals Unmet:  Not Applicable  Comments: Pt able to follow exercise prescription today without complaint.  Will continue to monitor for progression. See ITP for goals reviewed.   Dr. Emily Filbert is Medical Director for [place] and [name].

## 2016-10-23 ENCOUNTER — Encounter: Payer: Self-pay | Admitting: Speech Pathology

## 2016-10-23 NOTE — Therapy (Signed)
[facility] MAIN [facility] SERVICES [address] [place], [place], [address] Phone: [phone]   Fax:  [phone]  Speech Language Pathology Treatment  Patient Details  Name: Kevin Pearson MRN: [id number] Date of Birth: 02-04-1951 Referring Provider: Dr. Cristopher Peru  Encounter Date: 10/22/2016      End of Session - 10/23/16 0903    Visit Number 2   Number of Visits 13   Date for SLP Re-Evaluation 12/01/16   SLP Start Time 1005   SLP Stop Time  1100   SLP Time Calculation (min) 55 min   Activity Tolerance Patient tolerated treatment well      Past Medical History:  Diagnosis Date  . Acid reflux   . Anxiety   . Arthritis   . Atrial fibrillation (HCC)   . CHF (congestive heart failure) (HCC)   . Chronic orthostatic hypotension   . Clotting disorder (HCC)   . COPD (chronic obstructive pulmonary disease) (HCC)   . Depression   . Elevated PSA   . Heart attack (HCC)   . Heart disease   . Heart failure (HCC)   . Hepatitis   . High cholesterol   . Hypertension   . Sleep apnea   . Urinary retention     Past Surgical History:  Procedure Laterality Date  . BLADDER SURGERY    . cardiac stents    . CYSTOSCOPY WITH URETHRAL DILATATION N/A 06/25/2016   Procedure: CYSTOSCOPY WITH URETHRAL DILATATION;  Surgeon: Vanna Scotland, MD;  Location: [facility] ORS;  Service: Urology;  Laterality: N/A;  . EP IMPLANTABLE DEVICE     St. Jude BiV-ICD  . GREEN LIGHT LASER TURP (TRANSURETHRAL RESECTION OF PROSTATE  2016   done in [place]     There were no vitals filed for this visit.      Subjective Assessment - 10/23/16 0902    Subjective Patient pleasant and cooperative   Currently in Pain? No/denies               ADULT SLP TREATMENT - 10/23/16 0001      General Information   Behavior/Cognition Alert;Cooperative;Pleasant mood;Impulsive;Distractible;Decreased sustained attention   HPI Pt is a 66 y/o male with a h/o COPD, congestive heart  failure, obstructive sleep apnea and kidney disease. Pt fell in 08/2015 and was not found for 30 hours. Pt had broken ribs and severe confusion after the fall which have improved. However pt continues to have difficulty with memory. Per MD noted pt has cognitive impairment d/t likely anoxic ischemia from fall plus a h/o TBI, long standing COPD, chronic pain with pain medications and benzodiazapine use and history of heavy alcohol use and polysubstance use.      Treatment Provided   Treatment provided Cognitive-Linquistic     Pain Assessment   Pain Assessment No/denies pain     Cognitive-Linquistic Treatment   Treatment focused on Cognition   Skilled Treatment COGNITIVE BARRIERS: Patient reports difficulty organizing his personal papers and being able to leave his house having everything that he needs.  PATIENT ASSIGNMENTS: (1) Patient to select weekly/monthly planner and notebook (2) patient to begin compiling list of cognitive barriers (3) patient to review "Keeping a Journal to Remember Events" (4) Determine patient goal for speech therapy.     Assessment / Recommendations / Plan   Plan Continue with current plan of care     Progression Toward Goals   Progression toward goals Progressing toward goals  SLP Education - 10/23/16 [phone]    Education provided Yes   Education Details SLP role in treating cognitive impairment   Person(s) Educated Patient   Methods Explanation   Comprehension Verbalized understanding            SLP Long Term Goals - 10/20/16 1319      SLP LONG TERM GOAL #1   Title Patient will demonstrate functional use of external memory aids with 80% acc within structured settins.    Baseline 0   Time 6   Period Weeks   Status New     SLP LONG TERM GOAL #2   Title Pt will demonstrate functional cognitive-communication skills for independent completion of personal responsibilities.    Baseline 0   Time 6   Period Weeks   Status New     SLP LONG TERM  GOAL #3   Title Pt wil complete executive function skills tasks with 80% acc.    Baseline 0   Time 6   Period Weeks   Status New     SLP LONG TERM GOAL #4   Title Patient will complete memory strategy activities with 80% acc.    Baseline 0   Time 6   Period Weeks   Status New          Plan - 10/23/16 1191    Clinical Impression Statement The patient demonstrates decreased attention to others and is having some difficulty determining his own cognitive barriers to independence.  He has been given assignments to complete.   Speech Therapy Frequency 2x / week   Duration Other (comment)   Treatment/Interventions Functional tasks;Cognitive reorganization;SLP instruction and feedback;Compensatory strategies;Patient/family education;Internal/external aids   Potential to Achieve Goals Good   Potential Considerations Family/community support;Co-morbidities   SLP Home Exercise Plan Written assignments given   Consulted and Agree with Plan of Care Patient      Patient will benefit from skilled therapeutic intervention in order to improve the following deficits and impairments:   Cognitive communication deficit    Problem List Patient Active Problem List   Diagnosis Date Noted  . Chronic bilateral low back pain 08/27/2016  . H/O hypotension 07/29/2016  . Chronic obstructive pulmonary disease (HCC) 07/16/2016  . Chronic systolic congestive heart failure (HCC) 07/16/2016  . Peripheral neuropathy 07/16/2016  . Anxiety and depression 07/16/2016  . Arthralgia of right foot 07/16/2016  . Urethral stricture 07/16/2016  . Urinary retention 06/24/2016   Dollene Primrose, MS/CCC- SLP  Leandrew Koyanagi 10/23/2016, 9:05 AM  [place] [facility] MAIN [facility] SERVICES [address] [place], [place], [address] Phone: [phone]   Fax:  [phone]   Name: Kevin Pearson MRN: [id number] Date of Birth: 1950/08/05

## 2016-10-26 ENCOUNTER — Ambulatory Visit (INDEPENDENT_AMBULATORY_CARE_PROVIDER_SITE_OTHER): Payer: BLUE CROSS/BLUE SHIELD | Admitting: Urology

## 2016-10-26 ENCOUNTER — Telehealth: Payer: Self-pay

## 2016-10-26 ENCOUNTER — Encounter: Payer: Self-pay | Admitting: Urology

## 2016-10-26 VITALS — BP 134/79 | HR 80 | Ht 67.0 in | Wt 275.0 lb

## 2016-10-26 DIAGNOSIS — N320 Bladder-neck obstruction: Secondary | ICD-10-CM

## 2016-10-26 DIAGNOSIS — R310 Gross hematuria: Secondary | ICD-10-CM | POA: Diagnosis not present

## 2016-10-26 LAB — URINALYSIS, COMPLETE
Bilirubin, UA: NEGATIVE
Glucose, UA: NEGATIVE
Ketones, UA: NEGATIVE
Nitrite, UA: NEGATIVE
Specific Gravity, UA: 1.015 (ref 1.005–1.030)
Urobilinogen, Ur: 2 mg/dL — ABNORMAL HIGH (ref 0.2–1.0)
pH, UA: 7 (ref 5.0–7.5)

## 2016-10-26 LAB — MICROSCOPIC EXAMINATION
Epithelial Cells (non renal): NONE SEEN /hpf (ref 0–10)
RBC, UA: NONE SEEN /hpf (ref 0–?)
WBC, UA: >30 /hpf — ABNORMAL HIGH (ref 0–?)

## 2016-10-26 MED ORDER — LIDOCAINE HCL 2 % EX GEL
1.0000 "application " | Freq: Once | CUTANEOUS | Status: AC
  Administered 2016-10-26: 1 via URETHRAL

## 2016-10-26 MED ORDER — CIPROFLOXACIN HCL 500 MG PO TABS
500.0000 mg | ORAL_TABLET | Freq: Once | ORAL | Status: AC
  Administered 2016-10-26: 500 mg via ORAL

## 2016-10-26 NOTE — Telephone Encounter (Signed)
Contacted Kevin Pearson per referral from Dr. Darrold Junker. Spoke with patient  about scheduling a new patient appointment to be followed here in the CHF clinic. He stated that he wished to keep his 3:30 appointment with Dr. Darrold Junker today but would like to schedule an appointment here on Thursday.   I scheduled the patient an appointment on Thursday at 8:40 AM, and faxed appointment information to [facility] cardiology.   Thank you for the referral.

## 2016-10-26 NOTE — Progress Notes (Signed)
   10/26/16  CC:  Chief Complaint  Patient presents with  . Cysto    HPI: 66 year old male CAD s/p PCI/ stent/ ICD, MI x multiple, afib on anticoagulation/ antiplatelet therapy with severe bladder neck contracture s/p emergent urethral dilation under MAC  in the setting of urinary retention on 06/25/16.  Review of medical records indicate that he had an in office laser vaporization of the prostate on 06/26/2015 by Dr. Fanny Skates.  It is unclear from the note what sort of laser was used, settings, etc.  He then developed a significant bladder neck contracture with plans for surgical management of this but unable to proceed secondary to local comorbidities and cardiac status.  Over the past few months, he's been self cathetering daily with a 16 French catheter without difficulty to keep his bladder neck open. Initially his daughter was doing this for him, but now is able to do it by himself without any pain. He has had a couple episodes of passing small tissue-like pieces and small clots the following morning after cathetering.  He presents today for cystoscopy for further evaluation of his bladder neck as well as to rule out any underlying pathology in his bladder.  Blood pressure 134/79, pulse 80, height _0  (1.702 m), weight 275 lb (124.7 kg). NED. A&Ox3.   No respiratory distress   Abd soft, NT, ND Normal phallus with bilateral descended testicles  Cystoscopy Procedure Note  Patient identification was confirmed, informed consent was obtained, and patient was prepped using Betadine solution.  Lidocaine jelly was administered per urethral meatus.    Preoperative abx where received prior to procedure.     Pre-Procedure: - Inspection reveals a normal caliber ureteral meatus.  Procedure: The flexible cystoscope was introduced without difficulty - No urethral strictures/lesions are present. - Small fibrotic prostate with tight bladder neck anteriorly, approximately 16  French in size just barely able to accommodate scope.  The bladder neck is somewhat irregular in contour. - Bilateral ureteral orifices identified - Bladder mucosa  reveals no ulcers, tumors, or lesions - No bladder stones - Mild trabeculation  Retroflexion unremarkable.   Post-Procedure: - Patient tolerated the procedure well  Assessment/ Plan:  1. Gross hematuria Likely related to traumatic catheterizations in the setting of anticoagulation Cystoscopy today as above  - Urinalysis, Complete - ciprofloxacin (CIPRO) tablet 500 mg; Take 1 tablet (500 mg total) by mouth once. - lidocaine (XYLOCAINE) 2 % jelly 1 application; Place 1 application into the urethra once.  2. Bladder neck contracture Tight 16 French bladder neck contracture, managed with daily self catheter Doing well, we'll continue conservative management at this time  Return for reschedule follow up with me for in 4 months .  Hollice Espy, MD

## 2016-10-27 ENCOUNTER — Ambulatory Visit: Payer: BLUE CROSS/BLUE SHIELD | Admitting: Speech Pathology

## 2016-10-27 ENCOUNTER — Other Ambulatory Visit: Payer: BLUE CROSS/BLUE SHIELD | Admitting: Urology

## 2016-10-27 ENCOUNTER — Encounter: Payer: BLUE CROSS/BLUE SHIELD | Admitting: Respiratory Therapy

## 2016-10-27 DIAGNOSIS — I5022 Chronic systolic (congestive) heart failure: Secondary | ICD-10-CM

## 2016-10-27 DIAGNOSIS — R41841 Cognitive communication deficit: Secondary | ICD-10-CM

## 2016-10-27 NOTE — Progress Notes (Signed)
Discharge Summary  Patient Details  Name: Kevin Pearson MRN: [id number] Date of Birth: January 02, 1951 Referring Provider:     Cardiac Rehab from 08/03/2016 in [facility]  Referring Provider  Cephas Darby MD       Number of Visits: 36/36   Reason for Discharge:  Completed his 36 sessions.  Referred to Heart Failure Clinic and eventually to our pulmonary rehab.   Smoking History:  History  Smoking Status  . Former Smoker  . Types: Cigarettes  . Quit date: 08/2015  Smokeless Tobacco  . Never Used    Diagnosis:  Heart failure, chronic systolic (HCC)  ADL UCSD:   Initial Exercise Prescription:     Initial Exercise Prescription - 08/03/16 1500      Date of Initial Exercise RX and Referring Provider   Date 08/03/16   Referring Provider Cephas Darby MD     Oxygen   Oxygen Continuous   Liters 2     Treadmill   MPH 1.3   Grade 0   Minutes 15   METs 2     NuStep   Level 1   Minutes 15   METs 2     Biostep-RELP   Level 2   Minutes 15   METs 2     Prescription Details   Frequency (times per week) 2   Duration Progress to 45 minutes of aerobic exercise without signs/symptoms of physical distress     Intensity   THRR 40-80% of Max Heartrate 104-139   Ratings of Perceived Exertion 11-15   Perceived Dyspnea 0-4     Progression   Progression Continue to progress workloads to maintain intensity without signs/symptoms of physical distress.     Resistance Training   Training Prescription Yes   Weight 3 lbs   Reps 10-15      Discharge Exercise Prescription (Final Exercise Prescription Changes):     Exercise Prescription Changes - 10/08/16 1400      Response to Exercise   Blood Pressure (Admit) 126/66   Blood Pressure (Exercise) 120/58   Blood Pressure (Exit) 124/72   Heart Rate (Admit) 70 bpm   Heart Rate (Exercise) 113 bpm   Heart Rate (Exit) 70 bpm   Rating of Perceived Exertion (Exercise) 12   Symptoms none   Duration Continue with 45 min of aerobic exercise without signs/symptoms of physical distress.   Intensity THRR unchanged     Progression   Progression Continue to progress workloads to maintain intensity without signs/symptoms of physical distress.   Average METs 1.97     Resistance Training   Training Prescription Yes   Weight 3 lbs   Reps 10-15     Interval Training   Interval Training No     Oxygen   Oxygen Continuous   Liters 2     Treadmill   MPH 1.8   Grade 0.5   Minutes 15   METs 2.5     NuStep   Level 5   Minutes 15   METs 2.4     Biostep-RELP   Level 2   Minutes 15   METs 1     Home Exercise Plan   Plans to continue exercise at [employer] (comment)  [employer] and walking   Frequency Add 3 additional days to program exercise sessions.   Initial Home Exercises Provided 09/10/16      Functional Capacity:     6 Minute Walk    Row Name 08/03/16 1533 10/15/16 1017  6 Minute Walk   Phase Initial Discharge    Distance 806 feet 1058 feet    Distance % Change  - 23.8 %  252 ft    Walk Time 5.5 minutes 6 minutes    # of Rest Breaks 1  30 sec 0    MPH 1.66 2    METS 2.23 2.53    RPE 16 14    Perceived Dyspnea  3 3    VO2 Peak 7.08 6.88    Symptoms Yes (comment)  -    Comments SOB used oxygen tank pulling    Resting HR 69 bpm 75 bpm    Resting BP 148/72 112/60    Max Ex. HR 119 bpm 94 bpm    Max Ex. BP 156/80 134/64    2 Minute Post BP 126/70  -      Interval HR   Baseline HR 69 75    1 Minute HR 79 91    2 Minute HR 80 55    3 Minute HR 82 57    4 Minute HR 109 94    5 Minute HR 98 91    6 Minute HR 97 62    2 Minute Post HR 84  -    Interval Heart Rate? Yes Yes      Interval Oxygen   Interval Oxygen? Yes Yes    Baseline Oxygen Saturation % 100 % 99 %    Baseline Liters of Oxygen 2 L 2 L    1 Minute Oxygen Saturation % 97 % 98 %    1 Minute Liters of Oxygen 2 L 2 L    2 Minute Oxygen Saturation % 96 %  rest break  2:45-3:15 99 %    2 Minute Liters of Oxygen 2 L 2 L    3 Minute Oxygen Saturation % 100 % 97 %    3 Minute Liters of Oxygen 2 L 2 L    4 Minute Oxygen Saturation % 97 % 98 %    4 Minute Liters of Oxygen 2 L 2 L    5 Minute Oxygen Saturation % 98 % 97 %    5 Minute Liters of Oxygen 2 L 2 L    6 Minute Oxygen Saturation % 96 % 98 %    6 Minute Liters of Oxygen 2 L 2 L    2 Minute Post Oxygen Saturation % 100 %  -    2 Minute Post Liters of Oxygen 2 L  -       Psychological, QOL, Others - Outcomes: PHQ 2/9: Depression screen [facility] 2/9 10/08/2016 08/03/2016 08/03/2016  Decreased Interest 1 - 1  Down, Depressed, Hopeless 1 - 1  PHQ - 2 Score 2 - 2  Altered sleeping 1 - 1  Tired, decreased energy 1 - 1  Change in appetite 2 - 1  Feeling bad or failure about yourself  1 - 2  Trouble concentrating 1 - 1  Moving slowly or fidgety/restless 2 - 2  Suicidal thoughts 0 (No Data) 0  PHQ-9 Score 10 - 10  Difficult doing work/chores Somewhat difficult - Somewhat difficult    Quality of Life:     Quality of Life - 10/08/16 0838      Quality of Life Scores   Health/Function Pre 8.17 %   Health/Function Post 18.5 %   Health/Function % Change 126.44 %   Socioeconomic Pre 8.43 %   Socioeconomic Post 17.5 %   Socioeconomic %  Change  107.59 %   Psych/Spiritual Pre 10.14 %   Psych/Spiritual Post 21.07 %   Psych/Spiritual % Change 107.79 %   Family Pre 12.7 %   Family Post 23.1 %   Family % Change 81.89 %   GLOBAL Pre 9.29 %   GLOBAL Post 19.53 %   GLOBAL % Change 110.23 %      Personal Goals: Goals established at orientation with interventions provided to work toward goal.     Personal Goals and Risk Factors at Admission - 08/03/16 1311      Core Components/Risk Factors/Patient Goals on Admission    Weight Management Yes;Obesity;Weight Loss   Admit Weight 264 lb 3.2 oz (119.8 kg)   Goal Weight: Short Term 260 lb (117.9 kg)   Goal Weight: Long Term 180 lb (81.6 kg)   Expected  Outcomes Short Term: Continue to assess and modify interventions until short term weight is achieved;Long Term: Adherence to nutrition and physical activity/exercise program aimed toward attainment of established weight goal;Weight Loss: Understanding of general recommendations for a balanced deficit meal plan, which promotes 1-2 lb weight loss per week and includes a negative energy balance of [phone] kcal/d;Understanding recommendations for meals to include 15-35% energy as protein, 25-35% energy from fat, 35-60% energy from carbohydrates, less than 200mg  of dietary cholesterol, 20-35 gm of total fiber daily;Understanding of distribution of calorie intake throughout the day with the consumption of 4-5 meals/snacks   Sedentary Yes   Intervention Provide advice, education, support and counseling about physical activity/exercise needs.;Develop an individualized exercise prescription for aerobic and resistive training based on initial evaluation findings, risk stratification, comorbidities and participant's personal goals.   Expected Outcomes Achievement of increased cardiorespiratory fitness and enhanced flexibility, muscular endurance and strength shown through measurements of functional capacity and personal statement of participant.   Increase Strength and Stamina Yes   Intervention Provide advice, education, support and counseling about physical activity/exercise needs.;Develop an individualized exercise prescription for aerobic and resistive training based on initial evaluation findings, risk stratification, comorbidities and participant's personal goals.   Expected Outcomes Achievement of increased cardiorespiratory fitness and enhanced flexibility, muscular endurance and strength shown through measurements of functional capacity and personal statement of participant.   Improve shortness of breath with ADL's Yes  Has COPD, wears oxygen as prescribed   Intervention Provide education, individualized  exercise plan and daily activity instruction to help decrease symptoms of SOB with activities of daily living.   Expected Outcomes Short Term: Achieves a reduction of symptoms when performing activities of daily living.   Heart Failure Yes   Intervention Provide a combined exercise and nutrition program that is supplemented with education, support and counseling about heart failure. Directed toward relieving symptoms such as shortness of breath, decreased exercise tolerance, and extremity edema.   Expected Outcomes Improve functional capacity of life;Short term: Attendance in program 2-3 days a week with increased exercise capacity. Reported lower sodium intake. Reported increased fruit and vegetable intake. Reports medication compliance.;Short term: Daily weights obtained and reported for increase. Utilizing diuretic protocols set by physician.;Long term: Adoption of self-care skills and reduction of barriers for early signs and symptoms recognition and intervention leading to self-care maintenance.   Hypertension Yes   Intervention Provide education on lifestyle modifcations including regular physical activity/exercise, weight management, moderate sodium restriction and increased consumption of fresh fruit, vegetables, and low fat dairy, alcohol moderation, and smoking cessation.;Monitor prescription use compliance.   Expected Outcomes Short Term: Continued assessment and intervention until BP is <  140/34mm HG in hypertensive participants. < 130/24mm HG in hypertensive participants with diabetes, heart failure or chronic kidney disease.;Long Term: Maintenance of blood pressure at goal levels.   Lipids Yes   Intervention Provide education and support for participant on nutrition & aerobic/resistive exercise along with prescribed medications to achieve LDL 70mg , HDL >40mg .   Expected Outcomes Short Term: Participant states understanding of desired cholesterol values and is compliant with medications  prescribed. Participant is following exercise prescription and nutrition guidelines.;Long Term: Cholesterol controlled with medications as prescribed, with individualized exercise RX and with personalized nutrition plan. Value goals: LDL < , HDL > 40 mg.   Stress Yes   Intervention Offer individual and/or small group education and counseling on adjustment to heart disease, stress management and health-related lifestyle change. Teach and support self-help strategies.;Refer participants experiencing significant psychosocial distress to appropriate mental health specialists for further evaluation and treatment. When possible, include family members and significant others in education/counseling sessions.   Expected Outcomes Short Term: Participant demonstrates changes in health-related behavior, relaxation and other stress management skills, ability to obtain effective social support, and compliance with psychotropic medications if prescribed.;Long Term: Emotional wellbeing is indicated by absence of clinically significant psychosocial distress or social isolation.       Personal Goals Discharge:     Goals and Risk Factor Review    Row Name 08/27/16 1442 09/29/16 1042 10/22/16 1101         Core Components/Risk Factors/Patient Goals Review   Personal Goals Review Sedentary;Increase Strength and Stamina;Lipids;Hypertension;Heart Failure;Weight Management/Obesity Weight Management/Obesity;Lipids;Hypertension;Heart Failure Weight Management/Obesity;Lipids;Hypertension;Heart Failure     Review Stephane has mentioned that he is already noticing an improvement since beginning rehab. He has said that even his daughter has noticed the changes in him.  He not forgetting as much and moving better.  His blood pressures have been good in rehab, and no problems with his heart meds.  He weighs here daily and been under fairly good control.  He does not have a scale at home to weigh, but is going to work with the  foundation to get one.  He also wants to walk more, but needs better shoes.  He is hoping the foundation will help with this as well. Jemmie continues to see the benefits of rehab.  His weight is down today and he now has a scale (through [employer]) to track his weight daily.  He denies any heart failure symptoms currently.  His blood pressures have been good and he has not had any problems with his medications. Bunny has throughly enjoyed rehab.  Unfortunately his weight is back up today (total of 12 lbs since beginning of program).  He is having some swelling and shortness of breath as well and plans to call his cardiologist today after his next appointment.  Blood pressures have continued to be good and no changes in his lipids.  He has been out of his calcium for a few days and also plans to talk to the doctor about this as well.     Expected Outcomes Short: Jeniel will continue to come to rehab for exercise, get fitted for shoes.  He was also scheduled to meet with dietician today.  Long: Get new shoes, get scale for home use, monitor risk factors. Short: Sopheap will start to use his scale to help monitor symptoms.  Long: He will continue to work on risk factor modification. Short: Dashel will graduate from cardiac rehab.  Long: Continue to work on his risk factors.  Nutrition & Weight - Outcomes:     Pre Biometrics - 08/03/16 1540      Pre Biometrics   Height 5' 7.9" (1.725 m)   Weight 264 lb 3.2 oz (119.8 kg)   Waist Circumference 47 inches   Hip Circumference 47 inches   Waist to Hip Ratio 1 %   BMI (Calculated) 40.4   Single Leg Stand 1.89 seconds       Nutrition:     Nutrition Therapy & Goals - 08/27/16 1139      Nutrition Therapy   Diet basic heart healthy   Drug/Food Interactions Statins/Certain Fruits   Protein (specify units) 8-9oz   Fiber 30 grams   Whole Grain Foods 3 servings   Saturated Fats 14 max. grams   Fruits and Vegetables 8 servings/day   Sodium  1500 grams     Personal Nutrition Goals   Nutrition Goal Continue to increase vegetable and fruit intake, as well as whole grains, nuts, beans, fish.      Intervention Plan   Intervention Prescribe, educate and counsel regarding individualized specific dietary modifications aiming towards targeted core components such as weight, hypertension, lipid management, diabetes, heart failure and other comorbidities.;Nutrition handout(s) given to patient.   Expected Outcomes Short Term Goal: Understand basic principles of dietary content, such as calories, fat, sodium, cholesterol and nutrients.;Short Term Goal: A plan has been developed with personal nutrition goals set during dietitian appointment.;Long Term Goal: Adherence to prescribed nutrition plan.      Nutrition Discharge:     Nutrition Assessments - 10/08/16 0941      MEDFICTS Scores   Pre Score 60   Post Score 42   Score Difference -18      Education Questionnaire Score:     Knowledge Questionnaire Score - 10/08/16 0839      Knowledge Questionnaire Score   Pre Score 25/28  responses reviewed wiht verbal response of understanding   Post Score 26/28      Goals reviewed with patient; copy given to patient.

## 2016-10-27 NOTE — Progress Notes (Signed)
Cardiac Individual Treatment Plan  Patient Details  Name: Kevin Pearson MRN: [id number] Date of Birth: [birth year] Referring Provider:     Cardiac Rehab from 08/03/2016 in [facility] Cardiac [facility] Pulmonary Rehab  Referring Provider  Peggye Form MD      Initial Encounter Date:    Cardiac Rehab from 08/03/2016 in [facility] Cardiac and Pulmonary Rehab  Date  08/03/16  Referring Provider  Peggye Form MD      Visit Diagnosis: Heart failure, chronic systolic ([place])  Patient's Home Medications on Admission:  Current Outpatient Prescriptions:  .  acetaminophen (TYLENOL) 325 MG tablet, Take 650 mg by mouth every 6 (six) hours as needed., Disp: , Rfl:  .  albuterol (PROVENTIL HFA;VENTOLIN HFA) 108 (90 Base) MCG/ACT inhaler, Inhale 2 puffs into the lungs every 4 (four) hours as needed for shortness of breath., Disp: , Rfl:  .  budesonide (PULMICORT) 0.5 MG/2ML nebulizer solution, Inhale 2 mLs into the lungs daily., Disp: , Rfl: 1 .  carvedilol (COREG) 6.25 MG tablet, Take 6.25 mg by mouth 2 (two) times daily., Disp: , Rfl:  .  clonazePAM (KLONOPIN) 1 MG tablet, Take 0.5 mg by mouth 3 (three) times daily. , Disp: , Rfl:  .  clopidogrel (PLAVIX) 75 MG tablet, Take 75 mg by mouth daily., Disp: , Rfl:  .  dicyclomine (BENTYL) 10 MG capsule, Take 1 capsule (10 mg total) by mouth 3 (three) times daily as needed for spasms. (Patient not taking: Reported on 10/06/2016), Disp: 20 capsule, Rfl: 0 .  divalproex (DEPAKOTE ER) 500 MG 24 hr tablet, Take 500-750 mg by mouth 2 (two) times daily. Take 500 mg in the morning and 750 mg at night., Disp: , Rfl:  .  furosemide (LASIX) 40 MG tablet, Take 40 mg by mouth 2 (two) times daily., Disp: , Rfl:  .  gabapentin (NEURONTIN) 300 MG capsule, Take 300-600 mg by mouth 3 (three) times daily. Take 300 mg in the morning, 300 mg at 2 pm and 300 mg at night., Disp: , Rfl:  .  hydrALAZINE (APRESOLINE) 25 MG tablet, Take 25 mg by mouth 3 (three) times daily., Disp: , Rfl:   .  Hyoscyamine Sulfate SL (LEVSIN/SL) 0.125 MG SUBL, Place 1 tablet under the tongue daily., Disp: 30 each, Rfl: 0 .  ipratropium-albuterol (DUONEB) 0.5-2.5 (3) MG/3ML SOLN, Take 3 mLs by nebulization every 6 (six) hours as needed (wheezing/shortness of breath)., Disp: , Rfl:  .  losartan (COZAAR) 50 MG tablet, Take 50 mg by mouth daily., Disp: , Rfl:  .  Melatonin 1 MG TABS, Take 6 tablets by mouth at bedtime. , Disp: , Rfl:  .  nitroGLYCERIN (NITROSTAT) 0.4 MG SL tablet, Place 0.4 mg under the tongue every 5 (five) minutes as needed for chest pain., Disp: , Rfl:  .  omeprazole (PRILOSEC) 40 MG capsule, Take 40 mg by mouth daily., Disp: , Rfl:  .  potassium chloride SA (K-DUR,KLOR-CON) 20 MEQ tablet, Take 20 mEq by mouth 2 (two) times daily., Disp: , Rfl:  .  rivaroxaban (XARELTO) 20 MG TABS tablet, Take 20 mg by mouth at bedtime., Disp: , Rfl:  .  rosuvastatin (CRESTOR) 20 MG tablet, Take 20 mg by mouth daily., Disp: , Rfl:  .  tamsulosin (FLOMAX) 0.4 MG CAPS capsule, TAKE ONE CAPSULE BY MOUTH EVERY DAY AFTER SUPPER, Disp: 30 capsule, Rfl: 3 .  traMADol (ULTRAM) 50 MG tablet, Take 50 mg by mouth every 6 (six) hours as needed for moderate pain or severe pain. ,  Disp: , Rfl:  .  traZODone (DESYREL) 100 MG tablet, Take 100 mg by mouth at bedtime., Disp: , Rfl: 5  Past Medical History: Past Medical History:  Diagnosis Date  . Acid reflux   . Anxiety   . Arthritis   . Atrial fibrillation ([place])   . CHF (congestive heart failure) ([place])   . Chronic orthostatic hypotension   . Clotting disorder ([place])   . COPD (chronic obstructive pulmonary disease) ([place])   . Depression   . Elevated PSA   . Heart attack ([place])   . Heart disease   . Heart failure ([place])   . Hepatitis   . High cholesterol   . Hypertension   . Sleep apnea   . Urinary retention     Tobacco Use: History  Smoking Status  . Former Smoker  . Types: Cigarettes  . Quit date: 08/2015  Smokeless Tobacco  . Never Used     Labs: Recent Review Flowsheet Data    There is no flowsheet data to display.       Exercise Target Goals:    Exercise Program Goal: Individual exercise prescription set with THRR, safety & activity barriers. Participant demonstrates ability to understand and report RPE using BORG scale, to self-measure pulse accurately, and to acknowledge the importance of the exercise prescription.  Exercise Prescription Goal: Starting with aerobic activity 30 plus minutes a day, 3 days per week for initial exercise prescription. Provide home exercise prescription and guidelines that participant acknowledges understanding prior to discharge.  Activity Barriers & Risk Stratification:     Activity Barriers & Cardiac Risk Stratification - 08/03/16 1309      Activity Barriers & Cardiac Risk Stratification   Activity Barriers Muscular Weakness;Shortness of Breath   Cardiac Risk Stratification High      6 Minute Walk:     6 Minute Walk    Row Name 08/03/16 1533 10/15/16 1017       6 Minute Walk   Phase Initial Discharge    Distance 806 feet 1058 feet    Distance % Change  - 23.8 %  252 ft    Walk Time 5.5 minutes 6 minutes    # of Rest Breaks 1  30 sec 0    MPH 1.66 2    METS 2.23 2.53    RPE 16 14    Perceived Dyspnea  3 3    VO2 Peak 7.08 6.88    Symptoms Yes (comment)  -    Comments SOB used oxygen tank pulling    Resting HR 69 bpm 75 bpm    Resting BP 148/72 112/60    Max Ex. HR 119 bpm 94 bpm    Max Ex. BP 156/80 134/64    2 Minute Post BP 126/70  -      Interval HR   Baseline HR 69 75    1 Minute HR 79 91    2 Minute HR 80 55    3 Minute HR 82 57    4 Minute HR 109 94    5 Minute HR 98 91    6 Minute HR 97 62    2 Minute Post HR 84  -    Interval Heart Rate? Yes Yes      Interval Oxygen   Interval Oxygen? Yes Yes    Baseline Oxygen Saturation % 100 % 99 %    Baseline Liters of Oxygen 2 L 2 L    1 Minute Oxygen Saturation %  97 % 98 %    1 Minute Liters of  Oxygen 2 L 2 L    2 Minute Oxygen Saturation % 96 %  rest break 2:45-3:15 99 %    2 Minute Liters of Oxygen 2 L 2 L    3 Minute Oxygen Saturation % 100 % 97 %    3 Minute Liters of Oxygen 2 L 2 L    4 Minute Oxygen Saturation % 97 % 98 %    4 Minute Liters of Oxygen 2 L 2 L    5 Minute Oxygen Saturation % 98 % 97 %    5 Minute Liters of Oxygen 2 L 2 L    6 Minute Oxygen Saturation % 96 % 98 %    6 Minute Liters of Oxygen 2 L 2 L    2 Minute Post Oxygen Saturation % 100 %  -    2 Minute Post Liters of Oxygen 2 L  -       Oxygen Initial Assessment:   Oxygen Re-Evaluation:   Oxygen Discharge (Final Oxygen Re-Evaluation):   Initial Exercise Prescription:     Initial Exercise Prescription - 08/03/16 1500      Date of Initial Exercise RX and Referring Provider   Date 08/03/16   Referring Provider Peggye Form MD     Oxygen   Oxygen Continuous   Liters 2     Treadmill   MPH 1.3   Grade 0   Minutes 15   METs 2     NuStep   Level 1   Minutes 15   METs 2     Biostep-RELP   Level 2   Minutes 15   METs 2     Prescription Details   Frequency (times per week) 2   Duration Progress to 45 minutes of aerobic exercise without signs/symptoms of physical distress     Intensity   THRR 40-80% of Max Heartrate 104-139   Ratings of Perceived Exertion 11-15   Perceived Dyspnea 0-4     Progression   Progression Continue to progress workloads to maintain intensity without signs/symptoms of physical distress.     Resistance Training   Training Prescription Yes   Weight 3 lbs   Reps 10-15      Perform Capillary Blood Glucose checks as needed.  Exercise Prescription Changes:     Exercise Prescription Changes    Row Name 08/03/16 1400 08/12/16 1400 08/25/16 1600 09/09/16 1400 09/10/16 1000     Response to Exercise   Blood Pressure (Admit) 148/72 106/70 130/72 140/80  -   Blood Pressure (Exercise) 156/80 130/80 134/70 148/78  -   Blood Pressure (Exit)  126/70 126/70 100/62 118/60  -   Heart Rate (Admit) 69 bpm 69 bpm 68 bpm 72 bpm  -   Heart Rate (Exercise) 119 bpm 98 bpm 95 bpm 90 bpm  -   Heart Rate (Exit) 84 bpm 53 bpm 70 bpm 69 bpm  -   Oxygen Saturation (Admit) 100 %  -  -  -  -   Oxygen Saturation (Exercise) 96 %  -  -  -  -   Rating of Perceived Exertion (Exercise) _0 -   Perceived Dyspnea (Exercise) 3  -  -  -  -   Symptoms SOB none none none none   Duration  - Progress to 45 minutes of aerobic exercise without signs/symptoms of physical distress Progress to 45 minutes of aerobic exercise  without signs/symptoms of physical distress Continue with 45 min of aerobic exercise without signs/symptoms of physical distress. Continue with 45 min of aerobic exercise without signs/symptoms of physical distress.   Intensity  - THRR unchanged THRR unchanged THRR unchanged THRR unchanged     Progression   Progression  - Continue to progress workloads to maintain intensity without signs/symptoms of physical distress. Continue to progress workloads to maintain intensity without signs/symptoms of physical distress. Continue to progress workloads to maintain intensity without signs/symptoms of physical distress. Continue to progress workloads to maintain intensity without signs/symptoms of physical distress.   Average METs  - 1.35 2.23 2.2 2.2     Resistance Training   Training Prescription  - Yes Yes Yes Yes   Weight  - 3 lbs 3 lbs 3 lbs 3 lbs   Reps  - 10-15 10-15 10-15 10-15     Interval Training   Interval Training  - No No No No     Oxygen   Oxygen  -  - Continuous  when he remembers to wear it Continuous  Did not use on seated exercises Continuous  Did not use on seated exercises   Liters 2  - _0 Treadmill   MPH  -  - 1.3 1.3 1.3   Grade  -  - 0 0 0   Minutes  -  - _1 METs  -  - _2 NuStep   Level  - _3 Minutes  - _4 METs  - 1.7 2.4 2.2 2.2     Biostep-RELP   Level  - _5 Minutes  - _6 METs  - _7 Home Exercise Plan   Plans to continue exercise at  -  -  -  - Forensic scientist (comment)  Neurosurgeon and walking   Frequency  -  -  -  - Add 3 additional days to program exercise sessions.   Initial Home Exercises Provided  -  -  -  - 09/10/16     Exercise Review   Progression -  walk test results -  First Full Day of Exercise Yes  -  -   Row Name 09/23/16 1000 10/08/16 1400           Response to Exercise   Blood Pressure (Admit) 134/70 126/66      Blood Pressure (Exercise) 134/70 120/58      Blood Pressure (Exit) 126/62 124/72      Heart Rate (Admit) 88 bpm 70 bpm      Heart Rate (Exercise) 104 bpm 113 bpm      Heart Rate (Exit) 94 bpm 70 bpm      Rating of Perceived Exertion (Exercise) 12 12      Symptoms none none      Duration Continue with 45 min of aerobic exercise without signs/symptoms of physical distress. Continue with 45 min of aerobic exercise without signs/symptoms of physical distress.      Intensity THRR unchanged THRR unchanged        Progression   Progression Continue to progress workloads to maintain intensity without signs/symptoms of physical distress. Continue to progress workloads to maintain intensity without signs/symptoms of physical distress.      Average METs 2.3 1.97  Resistance Training   Training Prescription Yes Yes      Weight 3 lbs 3 lbs      Reps 10-15 10-15        Interval Training   Interval Training No No        Oxygen   Oxygen Continuous Continuous      Liters 2 2        Treadmill   MPH 1.8 1.8      Grade 0.5 0.5      Minutes 15 15      METs 2.5 2.5        NuStep   Level 5 5      Minutes 15 15      METs 2.4 2.4        Biostep-RELP   Level 2 2      Minutes 15 15      METs 2 1        Home Exercise Plan   Plans to continue exercise at [employer] (comment)  Neurosurgeon and walking [employer] (comment)  [place] and walking       Frequency Add 3 additional days to program exercise sessions. Add 3 additional days to program exercise sessions.      Initial Home Exercises Provided 09/10/16 09/10/16         Exercise Comments:     Exercise Comments    Row Name 08/11/16 1122 08/25/16 1618 09/03/16 1043 09/03/16 1047 09/10/16 0951   Exercise Comments First full day of exercise!  Patient was oriented to gym and equipment including functions, settings, policies, and procedures.  Patient's individual exercise prescription and treatment plan were reviewed.  All starting workloads were established based on the results of the 6 minute walk test done at initial orientation visit.  The plan for exercise progression was also introduced and progression will be customized based on patient's performance and goals. Laren is off to a good start with his rehab.  He is able to get from piece to piece without as much help now.  He is also up to level 4 on the NuStep. We will continue to monitor his progression. MET levels reviewed today. MET levels reviewed today. Reviewed METs average and discussed progression with pt today.   Row Name 10/27/16 1029           Exercise Comments Hodges graduated today from cardiac rehab with 36/36 sessions completed.  Details of the patient's exercise prescription and what He needs to do in order to continue the prescription and progress were discussed with patient.  Patient was given a copy of prescription and goals.  Patient verbalized understanding.  Inmer plans to continue to exercise by joining our [employer] and then [employer].          Exercise Goals and Review:   Exercise Goals Re-Evaluation :     Exercise Goals Re-Evaluation    Row Name 09/09/16 1445 09/10/16 1042 09/23/16 1012 09/29/16 1040 10/08/16 1439     Exercise Goal Re-Evaluation   Exercise Goals Review Increase Physical Activity;Increase Strenth and Stamina Increase Physical Activity Increase Physical Activity;Increase  Strenth and Stamina Increase Physical Activity;Increase Strenth and Stamina Increase Physical Activity;Increase Strenth and Stamina   Comments Kaspar has been doing well in rehab.  He is getting stronger and has noticed his stamina is starting to improve as well.  He is now up to level 5 on the NuStep.  We will continue to monitor his progression Reviewed  home exercise with pt today.  Pt plans to walk at home and to go to [employer] for exercise.  Reviewed THR, pulse, RPE, sign and symptoms, NTG use, and when to call 911 or MD.  Also discussed weather considerations and indoor options.  Pt voiced understanding. Wayman has been doing well with rehab.  He has not yet started walking at home as he has had some on going health stressors.  He is making some progress and is up to 1.8 mph on the treadmill.  We will continue to monitor his progression Tarus is feeling stronger and has increased his stamina since starting rehab.  He does continue to feel weak overall, but can tell the benefits of exercise.  He has yet to add in his home exercise as other things keep coming up.  He is going to try to make his health and exercise a greater priority in his life. Vrishank has been doing well in rehab.  He often needs encouragement to keep up his pace on the seated equipment.  We will continue to monitor his progression   Expected Outcomes Short: Continue to come to rehab exercise classes for increased activity and review home exercise guidelines.  Long: Become more independent in his exercise to continue to work on strength and stamina. Short: Aydn will add in home exercise.  Long: Zacharias will become more independent in exercise. Short: Raimundo will start walking at home and increase some on the BioStep.  Long: Continue to work on improving strength and stamina. Short: Hagen will start to go to the gym some on his off days.  Long: Exercise will become more routine to build strength and stamina. Short: Joffrey will increase  his resistance and pace on the steppers.  Long: Darreld will continue to come to class to work on strength and stamina.   [place] Name 10/15/16 1023 10/22/16 1057           Exercise Goal Re-Evaluation   Exercise Goals Review Increase Physical Activity;Increase Strenth and Stamina Increase Physical Activity;Increase Strenth and Stamina      Comments Makell improved his walk test by 23%! [facility] Tuesday.  He has done well in rehab and is now able to do more for himself as a result.  So much so, that he is now moiving into an independent living facility.  He is hoping to start pulmonary rehab after graduation.  He has mentioned how much he enjoys coming to the program.      Expected [place] will graduate from cardiac and hopefully will start in pulmonary once referral is established.         Discharge Exercise Prescription (Final Exercise Prescription Changes):     Exercise Prescription Changes - 10/08/16 1400      Response to Exercise   Blood Pressure (Admit) 126/66   Blood Pressure (Exercise) 120/58   Blood Pressure (Exit) 124/72   Heart Rate (Admit) 70 bpm   Heart Rate (Exercise) 113 bpm   Heart Rate (Exit) 70 bpm   Rating of Perceived Exertion (Exercise) 12   Symptoms none   Duration Continue with 45 min of aerobic exercise without signs/symptoms of physical distress.   Intensity THRR unchanged     Progression   Progression Continue to progress workloads to maintain intensity without signs/symptoms of physical distress.   Average METs 1.97     Resistance Training   Training Prescription Yes   Weight 3 lbs   Reps 10-15  Interval Training   Interval Training No     Oxygen   Oxygen Continuous   Liters 2     Treadmill   MPH 1.8   Grade 0.5   Minutes 15   METs 2.5     NuStep   Level 5   Minutes 15   METs 2.4     Biostep-RELP   Level 2   Minutes 15   METs 1     Home Exercise Plan   Plans to continue exercise at [employer] (comment)   [employer] and walking   Frequency Add 3 additional days to program exercise sessions.   Initial Home Exercises Provided 09/10/16      Nutrition:  Target Goals: Understanding of nutrition guidelines, daily intake of sodium '1500mg'$ , cholesterol '200mg'$ , calories 30% from fat and 7% or less from saturated fats, daily to have 5 or more servings of fruits and vegetables.  Biometrics:     Pre Biometrics - 08/03/16 1540      Pre Biometrics   Height 5' 7.9" (1.725 m)   Weight 264 lb 3.2 oz (119.8 kg)   Waist Circumference 47 inches   Hip Circumference 47 inches   Waist to Hip Ratio 1 %   BMI (Calculated) 40.4   Single Leg Stand 1.89 seconds       Nutrition Therapy Plan and Nutrition Goals:     Nutrition Therapy & Goals - 08/27/16 1139      Nutrition Therapy   Diet basic heart healthy   Drug/Food Interactions Statins/Certain Fruits   Protein (specify units) 8-9oz   Fiber 30 grams   Whole Grain Foods 3 servings   Saturated Fats 14 max. grams   Fruits and Vegetables 8 servings/day   Sodium 1500 grams     Personal Nutrition Goals   Nutrition Goal Continue to increase vegetable and fruit intake, as well as whole grains, nuts, beans, fish.      Intervention Plan   Intervention Prescribe, educate and counsel regarding individualized specific dietary modifications aiming towards targeted core components such as weight, hypertension, lipid management, diabetes, heart failure and other comorbidities.;Nutrition handout(s) given to patient.   Expected Outcomes Short Term Goal: Understand basic principles of dietary content, such as calories, fat, sodium, cholesterol and nutrients.;Short Term Goal: A plan has been developed with personal nutrition goals set during dietitian appointment.;Long Term Goal: Adherence to prescribed nutrition plan.      Nutrition Discharge: Rate Your Plate Scores:     Nutrition Assessments - 10/08/16 0941      MEDFICTS Scores   Pre Score 60   Post  Score 42   Score Difference -18      Nutrition Goals Re-Evaluation:     Nutrition Goals Re-Evaluation    Row Name 09/29/16 1124 10/22/16 1104           Goals   Current Weight 264 lb (119.7 kg) 276 lb (125.2 kg)      Nutrition Goal Continue to increase vegetable and fruit intake, as well as whole grains, nuts, beans, fish. Increase protein intake Continue to increase vegetable and fruit intake, as well as whole grains, nuts, beans, fish. Increase protein intake      Comment Jermaine is eating more fruits and vegetables.  He has completely switched to whole grains!  He has tried to increase his protien, but is only getting about 6 oz verus 8-9 oz.  He is using muscle milk and Boost for supplements. We talked about using real food  versus supplements. Delontae continues to work on improving his diet.  He is now staying away from fried foods and eating broiled or grilled.  He has been making more stews and is starting to enjoy cooking at home.  He is working with social work to get more food stamps to help as well.      Expected Outcome Short: Rolen will try to eat more real food and meals throughout the day.  Long: He will continue to work on dietary improvements. Short and Long: Karanveer will continue to work on improve his diet and aligning with heart healthy guidelines.         Nutrition Goals Discharge (Final Nutrition Goals Re-Evaluation):     Nutrition Goals Re-Evaluation - 10/22/16 1104      Goals   Current Weight 276 lb (125.2 kg)   Nutrition Goal Continue to increase vegetable and fruit intake, as well as whole grains, nuts, beans, fish. Increase protein intake   Comment Aziah continues to work on improving his diet.  He is now staying away from fried foods and eating broiled or grilled.  He has been making more stews and is starting to enjoy cooking at home.  He is working with social work to get more food stamps to help as well.   Expected Outcome Short and Long: Furqan will  continue to work on improve his diet and aligning with heart healthy guidelines.      Psychosocial: Target Goals: Acknowledge presence or absence of significant depression and/or stress, maximize coping skills, provide positive support system. Participant is able to verbalize types and ability to use techniques and skills needed for reducing stress and depression.   Initial Review & Psychosocial Screening:     Initial Psych Review & Screening - 08/03/16 1312      Initial Review   Comments Has chronic urological problem that requires surgical intervention. On hold until at least March.       Quality of Life Scores:      Quality of Life - 10/08/16 0838      Quality of Life Scores   Health/Function Pre 8.17 %   Health/Function Post 18.5 %   Health/Function % Change 126.44 %   Socioeconomic Pre 8.43 %   Socioeconomic Post 17.5 %   Socioeconomic % Change  107.59 %   Psych/Spiritual Pre 10.14 %   Psych/Spiritual Post 21.07 %   Psych/Spiritual % Change 107.79 %   Family Pre 12.7 %   Family Post 23.1 %   Family % Change 81.89 %   GLOBAL Pre 9.29 %   GLOBAL Post 19.53 %   GLOBAL % Change 110.23 %      PHQ-9: Recent Review Flowsheet Data    Depression screen [facility] 2/9 10/08/2016 08/03/2016 08/03/2016   Decreased Interest 1 - 1   Down, Depressed, Hopeless 1 - 1   PHQ - 2 Score 2 - 2   Altered sleeping 1 - 1   Tired, decreased energy 1 - 1   Change in appetite 2 - 1   Feeling bad or failure about yourself  1 - 2   Trouble concentrating 1 - 1   Moving slowly or fidgety/restless 2 - 2   Suicidal thoughts 0 (No Data)  0   PHQ-9 Score 10 - 10   Difficult doing work/chores Somewhat difficult - Somewhat difficult     Interpretation of Total Score  Total Score Depression Severity:  1-4 = Minimal depression, 5-9 = Mild depression, 10-14 =  Moderate depression, 15-19 = Moderately severe depression, 20-27 = Severe depression   Psychosocial Evaluation and Intervention:      Psychosocial Evaluation - 08/11/16 0928      Psychosocial Evaluation & Interventions   Interventions Encouraged to exercise with the program and follow exercise prescription;Stress management education   Comments Counselor met with Mr. Alwin Kicklighter) today for initial psychosocial evaluation.  He is a 66 year old who has quite a history of cardiac health issues with Congestive heart Failure and a pacemaker and defibrolator.  He also has COPD and some urological issues that are currently under treatment.  Mr. Seeley divorced 10 years ago after 58 years of marriage.  He was living in [place] until several months ago and his health had declined to such a degree that he moved here with his daughter and son-in-law.  His daughter is an Therapist, sports.  Mr. Hanner states he sleeps well and has a good appetite.  He admits to a history of depression and anxiety and is on medications for current symptoms, which he says may need more treatment - so he mentioned the need for a psychiatrist locally to help with this.  He states his mood is generally stable but he has had multiple stressors with the recent move and health issues.  His goals are to "get back on track" so he can move forward with the urological surgery in the next month or so.  Counselor will provide Mr. Perazzo with a list of local psychiatrists to help with his current mood and to manage his medications.     Continue Psychosocial Services  --  Counselor provided Psychiatrists names and numbers and will follow with Mr. Mccarrick on his mood and stress.  He will benefit from the psychoeducational components of this program.        Psychosocial Re-Evaluation:     Psychosocial Re-Evaluation    Row Name 08/20/16 [phone] 09/29/16 1127           Psychosocial Re-Evaluation   Current issues with  - Current Depression;Current Anxiety/Panic;Current Stress Concerns      Comments Counselor follow up with Mr. Venhuizen stating he is enjoying this program and feels it is beneficial  overall.  He has not contacted a psychiatrist yet to help with his mental health needs here locally.  Counselor provided him with a great local resource that he agreed to contact.  She has appointments available within the next 3 weeks.  Counselor and Cardiac Rehab staff will continue to follow with Mr. Dastrup.   Manuelito is scheduled to meet with psychiatrist in two weeks.  He has noticed an improvement in how he feels since starting, but now has ongoing panic attacks and stress about his health.        Expected Outcomes  - Short: Continue to exercise and meet with pyschiatrist.  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        Initial Review   Source of Stress Concerns  - Chronic Illness;Unable to perform yard/household activities;Unable to participate in former interests or hobbies;Family         Psychosocial Discharge (Final  Psychosocial Re-Evaluation):     Psychosocial Re-Evaluation - 09/29/16 1127      Psychosocial Re-Evaluation   Current issues with Current Depression;Current Anxiety/Panic;Current Stress Concerns   Comments Bradely is scheduled to meet with psychiatrist in two weeks.  He has noticed an improvement in how he feels since starting, but now has ongoing panic attacks and stress about his health.     Expected Outcomes Short: Continue to exercise and meet with pyschiatrist.  Long: Reduced stress and pain attacks   Interventions Therapist referral;Encouraged to attend Cardiac Rehabilitation for the exercise;Stress management education   Continue Psychosocial Services  Follow up required by counselor   Comments Learned about his brain  atrophy and calcium deposits.  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     Initial Review   Source of Stress Concerns Chronic Illness;Unable to perform yard/household activities;Unable to participate in former interests or hobbies;Family      Vocational Rehabilitation: Provide vocational rehab assistance to qualifying candidates.   Vocational Rehab Evaluation & Intervention:     Vocational Rehab - 08/03/16 1311      Initial Vocational Rehab Evaluation & Intervention   Assessment shows need for Vocational Rehabilitation No      Education: Education Goals: Education classes will be provided on a weekly basis, covering required topics. Participant will state understanding/return demonstration of topics presented.  Learning Barriers/Preferences:     Learning Barriers/Preferences - 08/03/16 1310      Learning Barriers/Preferences   Learning Barriers Hearing   Learning Preferences Skilled Demonstration      Education Topics: General Nutrition Guidelines/Fats and Fiber: -Group instruction provided by verbal, written material, models and posters to present the general guidelines for heart healthy nutrition. Gives an explanation and review of dietary fats and fiber.   Cardiac Rehab from 10/27/2016 in [facility] Cardiac and Pulmonary Rehab  Date  09/08/16  Educator  CR  Instruction Review Code  2- meets goals/outcomes      Controlling Sodium/Reading Food Labels: -Group verbal and written material supporting the discussion of sodium use in heart healthy nutrition. Review and explanation with models, verbal and written materials for utilization of the food label.   Exercise Physiology & Risk Factors: - Group verbal and written instruction with models to review the exercise physiology of the cardiovascular system and associated critical values. Details cardiovascular disease risk factors and the goals associated with each risk factor.   Cardiac  Rehab from 10/27/2016 in [facility] Cardiac and Pulmonary Rehab  Date  09/22/16  Educator  [facility]  Instruction Review Code  2- meets goals/outcomes      Aerobic Exercise & Resistance Training: - Gives group verbal and written discussion on the health impact of inactivity. On the components of aerobic and resistive training programs and the benefits of this training and how to safely progress through these programs.   Flexibility, Balance, General Exercise Guidelines: - Provides group verbal and written instruction on the benefits of flexibility and balance training programs. Provides general exercise guidelines with specific guidelines to those with heart or lung disease. Demonstration and skill practice provided.   Cardiac Rehab from 10/27/2016 in [facility] Cardiac and Pulmonary Rehab  Date  09/29/16  Educator  [facility]  Instruction Review Code  2- meets goals/outcomes      Stress Management: - Provides group verbal and written instruction about the health risks of elevated stress, cause of high stress, and healthy ways to reduce stress.   Cardiac Rehab from 10/27/2016  in [facility] Cardiac and Pulmonary Rehab  Date  08/13/16  Educator  TS  Instruction Review Code  2- meets goals/outcomes      Depression: - Provides group verbal and written instruction on the correlation between heart/lung disease and depressed mood, treatment options, and the stigmas associated with seeking treatment.   Cardiac Rehab from 10/27/2016 in [facility] Cardiac and Pulmonary Rehab  Date  09/10/16  Educator  TS  Instruction Review Code  2- meets goals/outcomes      Anatomy & Physiology of the Heart: - Group verbal and written instruction and models provide basic cardiac anatomy and physiology, with the coronary electrical and arterial systems. Review of: AMI, Angina, Valve disease, Heart Failure, Cardiac Arrhythmia, Pacemakers, and the ICD.   Cardiac Rehab from 10/27/2016 in [facility] Cardiac and Pulmonary Rehab  Date  10/08/16  Educator   SB  Instruction Review Code  2- meets goals/outcomes      Cardiac Procedures: - Group verbal and written instruction and models to describe the testing methods done to diagnose heart disease. Reviews the outcomes of the test results. Describes the treatment choices: Medical Management, Angioplasty, or Coronary Bypass Surgery.   Cardiac Rehab from 10/27/2016 in [facility] Cardiac and Pulmonary Rehab  Date  10/13/16  Educator  SB  Instruction Review Code  2- meets goals/outcomes      Cardiac Medications: - Group verbal and written instruction to review commonly prescribed medications for heart disease. Reviews the medication, class of the drug, and side effects. Includes the steps to properly store meds and maintain the prescription regimen.   Cardiac Rehab from 10/27/2016 in [facility] Cardiac and Pulmonary Rehab  Date  10/22/16  Educator  KS  Instruction Review Code  2- meets goals/outcomes [Part 1]      Go Sex-Intimacy & Heart Disease, Get SMART - Goal Setting: - Group verbal and written instruction through game format to discuss heart disease and the return to sexual intimacy. Provides group verbal and written material to discuss and apply goal setting through the application of the S.M.A.R.T. Method.   Cardiac Rehab from 10/27/2016 in [facility] Cardiac and Pulmonary Rehab  Date  10/13/16  Educator  SB  Instruction Review Code  2- meets goals/outcomes      Other Matters of the Heart: - Provides group verbal, written materials and models to describe Heart Failure, Angina, Valve Disease, and Diabetes in the realm of heart disease. Includes description of the disease process and treatment options available to the cardiac patient.   Cardiac Rehab from 10/27/2016 in [facility] Cardiac and Pulmonary Rehab  Date  10/08/16  Educator  SB  Instruction Review Code  2- meets goals/outcomes      Exercise & Equipment Safety: - Individual verbal instruction and demonstration of equipment use and safety with use of  the equipment.   Cardiac Rehab from 10/27/2016 in [facility] Cardiac and Pulmonary Rehab  Date  08/03/16  Educator  SB  Instruction Review Code  2- meets goals/outcomes      Infection Prevention: - Provides verbal and written material to individual with discussion of infection control including proper hand washing and proper equipment cleaning during exercise session.   Cardiac Rehab from 10/27/2016 in [facility] Cardiac and Pulmonary Rehab  Date  08/03/16  Educator  Sb  Instruction Review Code  2- meets goals/outcomes      Falls Prevention: - Provides verbal and written material to individual with discussion of falls prevention and safety.   Cardiac Rehab from 10/27/2016 in [facility] Cardiac  and Pulmonary Rehab  Date  08/03/16  Educator  Sb  Instruction Review Code  2- meets goals/outcomes      Diabetes: - Individual verbal and written instruction to review signs/symptoms of diabetes, desired ranges of glucose level fasting, after meals and with exercise. Advice that pre and post exercise glucose checks will be done for 3 sessions at entry of program.    Knowledge Questionnaire Score:     Knowledge Questionnaire Score - 10/08/16 0839      Knowledge Questionnaire Score   Pre Score 25/28  responses reviewed wiht verbal response of understanding   Post Score 26/28      Core Components/Risk Factors/Patient Goals at Admission:     Personal Goals and Risk Factors at Admission - 08/03/16 1311      Core Components/Risk Factors/Patient Goals on Admission    Weight Management Yes;Obesity;Weight Loss   Admit Weight 264 lb 3.2 oz (119.8 kg)   Goal Weight: Short Term 260 lb (117.9 kg)   Goal Weight: Long Term 180 lb (81.6 kg)   Expected Outcomes Short Term: Continue to assess and modify interventions until short term weight is achieved;Long Term: Adherence to nutrition and physical activity/exercise program aimed toward attainment of established weight goal;Weight Loss: Understanding of general  recommendations for a balanced deficit meal plan, which promotes 1-2 lb weight loss per week and includes a negative energy balance of [phone] kcal/d;Understanding recommendations for meals to include 15-35% energy as protein, 25-35% energy from fat, 35-60% energy from carbohydrates, less than 280m of dietary cholesterol, 20-35 gm of total fiber daily;Understanding of distribution of calorie intake throughout the day with the consumption of 4-5 meals/snacks   Sedentary Yes   Intervention Provide advice, education, support and counseling about physical activity/exercise needs.;Develop an individualized exercise prescription for aerobic and resistive training based on initial evaluation findings, risk stratification, comorbidities and participant's personal goals.   Expected Outcomes Achievement of increased cardiorespiratory fitness and enhanced flexibility, muscular endurance and strength shown through measurements of functional capacity and personal statement of participant.   Increase Strength and Stamina Yes   Intervention Provide advice, education, support and counseling about physical activity/exercise needs.;Develop an individualized exercise prescription for aerobic and resistive training based on initial evaluation findings, risk stratification, comorbidities and participant's personal goals.   Expected Outcomes Achievement of increased cardiorespiratory fitness and enhanced flexibility, muscular endurance and strength shown through measurements of functional capacity and personal statement of participant.   Improve shortness of breath with ADL's Yes  Has COPD, wears oxygen as prescribed   Intervention Provide education, individualized exercise plan and daily activity instruction to help decrease symptoms of SOB with activities of daily living.   Expected Outcomes Short Term: Achieves a reduction of symptoms when performing activities of daily living.   Heart Failure Yes   Intervention Provide  a combined exercise and nutrition program that is supplemented with education, support and counseling about heart failure. Directed toward relieving symptoms such as shortness of breath, decreased exercise tolerance, and extremity edema.   Expected Outcomes Improve functional capacity of life;Short term: Attendance in program 2-3 days a week with increased exercise capacity. Reported lower sodium intake. Reported increased fruit and vegetable intake. Reports medication compliance.;Short term: Daily weights obtained and reported for increase. Utilizing diuretic protocols set by physician.;Long term: Adoption of self-care skills and reduction of barriers for early signs and symptoms recognition and intervention leading to self-care maintenance.   Hypertension Yes   Intervention Provide education on lifestyle modifcations including regular physical  activity/exercise, weight management, moderate sodium restriction and increased consumption of fresh fruit, vegetables, and low fat dairy, alcohol moderation, and smoking cessation.;Monitor prescription use compliance.   Expected Outcomes Short Term: Continued assessment and intervention until BP is < 140/31m HG in hypertensive participants. < 130/891mHG in hypertensive participants with diabetes, heart failure or chronic kidney disease.;Long Term: Maintenance of blood pressure at goal levels.   Lipids Yes   Intervention Provide education and support for participant on nutrition & aerobic/resistive exercise along with prescribed medications to achieve LDL <7013mHDL >70m31m Expected Outcomes Short Term: Participant states understanding of desired cholesterol values and is compliant with medications prescribed. Participant is following exercise prescription and nutrition guidelines.;Long Term: Cholesterol controlled with medications as prescribed, with individualized exercise RX and with personalized nutrition plan. Value goals: LDL < 70mg68mL > 40 mg.   Stress  Yes   Intervention Offer individual and/or small group education and counseling on adjustment to heart disease, stress management and health-related lifestyle change. Teach and support self-help strategies.;Refer participants experiencing significant psychosocial distress to appropriate mental health specialists for further evaluation and treatment. When possible, include family members and significant others in education/counseling sessions.   Expected Outcomes Short Term: Participant demonstrates changes in health-related behavior, relaxation and other stress management skills, ability to obtain effective social support, and compliance with psychotropic medications if prescribed.;Long Term: Emotional wellbeing is indicated by absence of clinically significant psychosocial distress or social isolation.      Core Components/Risk Factors/Patient Goals Review:      Goals and Risk Factor Review    Row Name 08/27/16 1442 09/29/16 1042 10/22/16 1101         Core Components/Risk Factors/Patient Goals Review   Personal Goals Review Sedentary;Increase Strength and Stamina;Lipids;Hypertension;Heart Failure;Weight Management/Obesity Weight Management/Obesity;Lipids;Hypertension;Heart Failure Weight Management/Obesity;Lipids;Hypertension;Heart Failure     Review CarmeJoranmentioned that he is already noticing an improvement since beginning rehab. He has said that even his daughter has noticed the changes in him.  He not forgetting as much and moving better.  His blood pressures have been good in rehab, and no problems with his heart meds.  He weighs here daily and been under fairly good control.  He does not have a scale at home to weigh, but is going to work with the foundation to get one.  He also wants to walk more, but needs better shoes.  He is hoping the foundation will help with this as well. CarmeDaxxinues to see the benefits of rehab.  His weight is down today and he now has a scale (through the  [employer] his weight daily.  He denies any heart failure symptoms currently.  His blood pressures have been good and he has not had any problems with his medications. CarmeVincenthroughly enjoyed rehab.  Unfortunately his weight is back up today (total of 12 lbs since beginning of program).  He is having some swelling and shortness of breath as well and plans to call his cardiologist today after his next appointment.  Blood pressures have continued to be good and no changes in his lipids.  He has been out of his calcium for a few days and also plans to talk to the doctor about this as well.     Expected Outcomes Short: CarmeOndra continue to come to rehab for exercise, get fitted for shoes.  He was also scheduled to meet with dietician today.  Long: Get new shoes, get scale for home use, monitor  risk factors. Short: Denario will start to use his scale to help monitor symptoms.  Long: He will continue to work on risk factor modification. Short: Ray will graduate from cardiac rehab.  Long: Continue to work on his risk factors.        Core Components/Risk Factors/Patient Goals at Discharge (Final Review):      Goals and Risk Factor Review - 10/22/16 1101      Core Components/Risk Factors/Patient Goals Review   Personal Goals Review Weight Management/Obesity;Lipids;Hypertension;Heart Failure   Review Theodoros has throughly enjoyed rehab.  Unfortunately his weight is back up today (total of 12 lbs since beginning of program).  He is having some swelling and shortness of breath as well and plans to call his cardiologist today after his next appointment.  Blood pressures have continued to be good and no changes in his lipids.  He has been out of his calcium for a few days and also plans to talk to the doctor about this as well.   Expected Outcomes Short: Armari will graduate from cardiac rehab.  Long: Continue to work on his risk factors.      ITP Comments:     ITP Comments    Row Name  08/03/16 1249 08/05/16 0628 08/25/16 1023 09/02/16 0558 09/30/16 0557   ITP Comments Medical Review completed. Initial ITP created.   Documentation of diagnosis can be found Encounter Office visit on 07/03/2016 30 day review. Continue with ITP unless directed changes per Medical Director review. New to program Weight up several pounds today.  Burnell had complany this weekend and they all ate well per [place].  No HF symptoms reported, he was advised to watch for symptoms and call MD about symptoms. Nnamdi stated understanding of advice provided to him. 30 day review. Continue with ITP unless directed changes per Medical Director review.  New to program 30 day review. Continue with ITP unless directed changes per Medical Director review   [place] Name 10/08/16 1015           ITP Comments Eydan went to ED after he coughed up blood at home.  Chest xray and CT were negative, no known cause other than possible irritation.  He is to call to schedule a follow up with his physician.          Comments: Discharge ITP

## 2016-10-27 NOTE — Progress Notes (Signed)
Daily Session Note  Patient Details  Name: Kevin Pearson MRN: [id number] Date of Birth: 1951-01-07 Referring Provider:     Cardiac Rehab from 08/03/2016 in [facility] Cardiac and Pulmonary Rehab  Referring Provider  Peggye Form MD      Encounter Date: 10/27/2016  Check In:     Session Check In - 10/27/16 0842      Check-In   Location [facility]-Cardiac & Pulmonary Rehab   Staff Present Alberteen Sam, MA, ACSM RCEP, Exercise Physiologist;Susanne Bice, RN, BSN, CCRP;Laureen Owens Shark, BS, RRT, Respiratory Therapist   Supervising physician immediately available to respond to emergencies See telemetry face sheet for immediately available ER MD   Medication changes reported     No   Fall or balance concerns reported    No   Tobacco Cessation No Change   Warm-up and Cool-down Performed on first and last piece of equipment   Resistance Training Performed Yes   VAD Patient? No     Pain Assessment   Currently in Pain? No/denies   Multiple Pain Sites No         History  Smoking Status  . Former Smoker  . Types: Cigarettes  . Quit date: 08/2015  Smokeless Tobacco  . Never Used    Goals Met:  Independence with exercise equipment Exercise tolerated well No report of cardiac concerns or symptoms Strength training completed today  Goals Unmet:  Not Applicable  Comments:  Kevin Pearson graduated today from cardiac rehab with 36/36 sessions completed.  Details of the patient's exercise prescription and what He needs to do in order to continue the prescription and progress were discussed with patient.  Patient was given a copy of prescription and goals.  Patient verbalized understanding.  Kevin Pearson plans to continue to exercise by joining our [employer] and then [employer].    Dr. Emily Filbert is Medical Director for [name].

## 2016-10-28 ENCOUNTER — Encounter: Payer: Self-pay | Admitting: Speech Pathology

## 2016-10-28 NOTE — Therapy (Signed)
[facility] MAIN [facility] SERVICES [address] [place], [place], [address] Phone: [phone]   Fax:  [phone]  Speech Language Pathology Treatment  Patient Details  Name: Kevin Pearson MRN: [id number] Date of Birth: [birth year] Referring Provider: Dr. Cristopher Peru  Encounter Date: 10/27/2016      End of Session - 10/28/16 0921    Visit Number 3   Number of Visits 13   Date for SLP Re-Evaluation 12/01/16   SLP Start Time 1010   SLP Stop Time  1100   SLP Time Calculation (min) 50 min   Activity Tolerance Patient tolerated treatment well      Past Medical History:  Diagnosis Date  . Acid reflux   . Anxiety   . Arthritis   . Atrial fibrillation (HCC)   . CHF (congestive heart failure) (HCC)   . Chronic orthostatic hypotension   . Clotting disorder (HCC)   . COPD (chronic obstructive pulmonary disease) (HCC)   . Depression   . Elevated PSA   . Heart attack (HCC)   . Heart disease   . Heart failure (HCC)   . Hepatitis   . High cholesterol   . Hypertension   . Sleep apnea   . Urinary retention     Past Surgical History:  Procedure Laterality Date  . BLADDER SURGERY    . cardiac stents    . CYSTOSCOPY WITH URETHRAL DILATATION N/A 06/25/2016   Procedure: CYSTOSCOPY WITH URETHRAL DILATATION;  Surgeon: Vanna Scotland, MD;  Location: [facility] ORS;  Service: Urology;  Laterality: N/A;  . EP IMPLANTABLE DEVICE     St. Jude BiV-ICD  . GREEN LIGHT LASER TURP (TRANSURETHRAL RESECTION OF PROSTATE  2016   done in [place]     There were no vitals filed for this visit.      Subjective Assessment - 10/28/16 0920    Subjective "I think this will help" RE: compensatory strategies discussed today   Currently in Pain? No/denies               ADULT SLP TREATMENT - 10/28/16 0001      General Information   Behavior/Cognition Alert;Cooperative;Pleasant mood;Impulsive;Distractible;Decreased sustained attention   HPI Pt is a 66 y/o male  with a h/o COPD, congestive heart failure, obstructive sleep apnea and kidney disease. Pt fell in 08/2015 and was not found for 30 hours. Pt had broken ribs and severe confusion after the fall which have improved. However pt continues to have difficulty with memory. Per MD noted pt has cognitive impairment d/t likely anoxic ischemia from fall plus a h/o TBI, long standing COPD, chronic pain with pain medications and benzodiazapine use and history of heavy alcohol use and polysubstance use.      Treatment Provided   Treatment provided Cognitive-Linquistic     Pain Assessment   Pain Assessment No/denies pain     Cognitive-Linquistic Treatment   Treatment focused on Cognition   Skilled Treatment COGNITIVE BARRIERS: Patient identified having difficulty effectively completing grocery shopping due to forgetting his list when he went to the store.  The patient participated in problem solving solutions to improve effectiveness in this task.  He agrees to have a section in his planner/notebook for Meal Planning- to include menus and grocery list in 1 spot.  Patient identifies difficulty finding places and he cannot use the GPS app his daughter put on his phone.  He is to get help in finding a gps app that better fits his needs.  PATIENT  ASSIGNMENTS: Patient completed assignments given last session.  New assignments (1) Patient to select weekly/monthly planner and notebook that will fit into one binder.  (2) transfer information from previous planners (3) begin meal planning section (4) get help to select a GPS phone app.     Assessment / Recommendations / Plan   Plan Continue with current plan of care     Progression Toward Goals   Progression toward goals Progressing toward goals          SLP Education - 10/28/16 0920    Education provided Yes   Education Details identifying barriers and problemsoving potential compensatory strategies   Person(s) Educated Patient   Methods Explanation    Comprehension Verbalized understanding            SLP Long Term Goals - 10/20/16 1319      SLP LONG TERM GOAL #1   Title Patient will demonstrate functional use of external memory aids with 80% acc within structured settins.    Baseline 0   Time 6   Period Weeks   Status New     SLP LONG TERM GOAL #2   Title Pt will demonstrate functional cognitive-communication skills for independent completion of personal responsibilities.    Baseline 0   Time 6   Period Weeks   Status New     SLP LONG TERM GOAL #3   Title Pt wil complete executive function skills tasks with 80% acc.    Baseline 0   Time 6   Period Weeks   Status New     SLP LONG TERM GOAL #4   Title Patient will complete memory strategy activities with 80% acc.    Baseline 0   Time 6   Period Weeks   Status New          Plan - 10/28/16 6962    Clinical Impression Statement The patient is actively participating in identifying cognitive barriers and problem solving effective strategies.   Speech Therapy Frequency 2x / week   Duration Other (comment)   Treatment/Interventions Functional tasks;Cognitive reorganization;SLP instruction and feedback;Compensatory strategies;Patient/family education;Internal/external aids   Potential to Achieve Goals Good   Potential Considerations Family/community support;Co-morbidities   SLP Home Exercise Plan Written assignments given   Consulted and Agree with Plan of Care Patient      Patient will benefit from skilled therapeutic intervention in order to improve the following deficits and impairments:   Cognitive communication deficit    Problem List Patient Active Problem List   Diagnosis Date Noted  . Chronic bilateral low back pain 08/27/2016  . H/O hypotension 07/29/2016  . Chronic obstructive pulmonary disease (HCC) 07/16/2016  . Chronic systolic congestive heart failure (HCC) 07/16/2016  . Peripheral neuropathy 07/16/2016  . Anxiety and depression 07/16/2016  .  Arthralgia of right foot 07/16/2016  . Urethral stricture 07/16/2016  . Urinary retention 06/24/2016   Dollene Primrose, MS/CCC- SLP  Leandrew Koyanagi 10/28/2016, 9:22 AM  [facility] MAIN [facility] SERVICES [address] [place], [place], [address] Phone: [phone]   Fax:  [phone]   Name: Kevin Pearson MRN: [id number] Date of Birth: 02-29-1951

## 2016-10-29 ENCOUNTER — Ambulatory Visit
Admission: RE | Admit: 2016-10-29 | Discharge: 2016-10-29 | Disposition: A | Discharge status: Home / Self Care | Payer: BLUE CROSS/BLUE SHIELD | Source: Ambulatory Visit | Attending: Family Medicine | Admitting: Family Medicine

## 2016-10-29 ENCOUNTER — Other Ambulatory Visit: Payer: Self-pay | Admitting: Family Medicine

## 2016-10-29 ENCOUNTER — Encounter: Payer: Self-pay | Admitting: Family

## 2016-10-29 ENCOUNTER — Ambulatory Visit: Payer: BLUE CROSS/BLUE SHIELD | Admitting: Family

## 2016-10-29 ENCOUNTER — Ambulatory Visit: Payer: BLUE CROSS/BLUE SHIELD | Admitting: Speech Pathology

## 2016-10-29 ENCOUNTER — Ambulatory Visit: Payer: BLUE CROSS/BLUE SHIELD | Attending: Family | Admitting: Family

## 2016-10-29 VITALS — BP 110/72 | HR 70 | Resp 20 | Ht 67.0 in | Wt 272.0 lb

## 2016-10-29 DIAGNOSIS — M25511 Pain in right shoulder: Secondary | ICD-10-CM | POA: Insufficient documentation

## 2016-10-29 DIAGNOSIS — I959 Hypotension, unspecified: Secondary | ICD-10-CM | POA: Diagnosis not present

## 2016-10-29 DIAGNOSIS — Z885 Allergy status to narcotic agent status: Secondary | ICD-10-CM | POA: Insufficient documentation

## 2016-10-29 DIAGNOSIS — I4891 Unspecified atrial fibrillation: Secondary | ICD-10-CM | POA: Insufficient documentation

## 2016-10-29 DIAGNOSIS — Z7951 Long term (current) use of inhaled steroids: Secondary | ICD-10-CM | POA: Diagnosis not present

## 2016-10-29 DIAGNOSIS — I252 Old myocardial infarction: Secondary | ICD-10-CM | POA: Diagnosis not present

## 2016-10-29 DIAGNOSIS — M1990 Unspecified osteoarthritis, unspecified site: Secondary | ICD-10-CM | POA: Insufficient documentation

## 2016-10-29 DIAGNOSIS — I5022 Chronic systolic (congestive) heart failure: Secondary | ICD-10-CM | POA: Diagnosis present

## 2016-10-29 DIAGNOSIS — Z7901 Long term (current) use of anticoagulants: Secondary | ICD-10-CM | POA: Insufficient documentation

## 2016-10-29 DIAGNOSIS — J449 Chronic obstructive pulmonary disease, unspecified: Secondary | ICD-10-CM | POA: Insufficient documentation

## 2016-10-29 DIAGNOSIS — F419 Anxiety disorder, unspecified: Secondary | ICD-10-CM | POA: Insufficient documentation

## 2016-10-29 DIAGNOSIS — G4733 Obstructive sleep apnea (adult) (pediatric): Secondary | ICD-10-CM | POA: Diagnosis not present

## 2016-10-29 DIAGNOSIS — E785 Hyperlipidemia, unspecified: Secondary | ICD-10-CM | POA: Insufficient documentation

## 2016-10-29 DIAGNOSIS — M25512 Pain in left shoulder: Secondary | ICD-10-CM | POA: Insufficient documentation

## 2016-10-29 DIAGNOSIS — Z87891 Personal history of nicotine dependence: Secondary | ICD-10-CM | POA: Insufficient documentation

## 2016-10-29 DIAGNOSIS — I110 Hypertensive heart disease with heart failure: Secondary | ICD-10-CM | POA: Insufficient documentation

## 2016-10-29 DIAGNOSIS — G8929 Other chronic pain: Secondary | ICD-10-CM

## 2016-10-29 DIAGNOSIS — Z9981 Dependence on supplemental oxygen: Secondary | ICD-10-CM | POA: Diagnosis not present

## 2016-10-29 DIAGNOSIS — F329 Major depressive disorder, single episode, unspecified: Secondary | ICD-10-CM | POA: Insufficient documentation

## 2016-10-29 DIAGNOSIS — I950 Idiopathic hypotension: Secondary | ICD-10-CM

## 2016-10-29 DIAGNOSIS — K219 Gastro-esophageal reflux disease without esophagitis: Secondary | ICD-10-CM | POA: Diagnosis not present

## 2016-10-29 DIAGNOSIS — R41841 Cognitive communication deficit: Secondary | ICD-10-CM | POA: Diagnosis not present

## 2016-10-29 DIAGNOSIS — Z7902 Long term (current) use of antithrombotics/antiplatelets: Secondary | ICD-10-CM | POA: Diagnosis not present

## 2016-10-29 DIAGNOSIS — Z79899 Other long term (current) drug therapy: Secondary | ICD-10-CM | POA: Diagnosis not present

## 2016-10-29 NOTE — Progress Notes (Signed)
Patient ID: Kevin Pearson, male    DOB: 10-18-1950, 66 y.o.   MRN: [id number]  HPI  Mr Dogan is a 66 y/o male with a history of GERD, anxiety, atrial fibrillation, depression, COPD (chronic O2), TBI, MI (multiple stents), hepatitis, hyperlipidemia, HTN, obstructive sleep apnea (without CPAP), remote tobacco/drug/alcohol use and chronic heart failure.   Reviewed last echo report on 02/14/16 which showed an EF of 35%.   Was in the ED 10/06/16 due to hemoptysis. Evaluated and discharged home. ED visit on 07/27/16 due to chest pain. Troponins were negative and he was discharged home. Was in the ED 07/02/16 due to bladder spasms. Treated and discharged home. Admitted 06/24/16 due to urinary retention. Had urethral dilation done and was discharged the next day.   He presents today for his initial visit with a Kevin complaint of chronic edema in his lower legs. He describes it as moderate in intensity and had been getting worse over the last week or so. Has associated fatigue, shortness of breath, abdominal distention and weight gain with this.   Past Medical History:  Diagnosis Date  . Acid reflux   . Anxiety   . Arthritis   . Atrial fibrillation (HCC)   . CHF (congestive heart failure) (HCC)   . Chronic orthostatic hypotension   . Clotting disorder (HCC)   . COPD (chronic obstructive pulmonary disease) (HCC)   . Depression   . Elevated PSA   . Heart attack (HCC)   . Heart disease   . Heart failure (HCC)   . Hepatitis   . High cholesterol   . Hypertension   . Sleep apnea   . Urinary retention    Past Surgical History:  Procedure Laterality Date  . BLADDER SURGERY    . cardiac stents    . CYSTOSCOPY WITH URETHRAL DILATATION N/A 06/25/2016   Procedure: CYSTOSCOPY WITH URETHRAL DILATATION;  Surgeon: Vanna Scotland, MD;  Location: [facility] ORS;  Service: Urology;  Laterality: N/A;  . EP IMPLANTABLE DEVICE     St. Jude BiV-ICD  . GREEN LIGHT LASER TURP (TRANSURETHRAL RESECTION OF PROSTATE   2016   done in [facility]    Family History  Problem Relation Age of Onset  . Other Father     Cerebral hemorrhage  . Kidney cancer Neg Hx   . Kidney disease Neg Hx   . Prostate cancer Neg Hx    Social History  Substance Use Topics  . Smoking status: Former Smoker    Types: Cigarettes    Quit date: 08/2015  . Smokeless tobacco: Never Used  . Alcohol use Yes   Allergies  Allergen Reactions  . Other Anaphylaxis and Itching    Mango skin  . Codeine Nausea And Vomiting   Prior to Admission medications   Medication Sig Start Date End Date Taking? Authorizing Provider  acetaminophen (TYLENOL) 325 MG tablet Take 650 mg by mouth every 6 (six) hours as needed.   Yes Historical Provider, MD  albuterol (PROVENTIL HFA;VENTOLIN HFA) 108 (90 Base) MCG/ACT inhaler Inhale 2 puffs into the lungs every 4 (four) hours as needed for shortness of breath.   Yes Historical Provider, MD  budesonide (PULMICORT) 0.5 MG/2ML nebulizer solution Inhale 2 mLs into the lungs daily. 09/26/16  Yes Historical Provider, MD  carvedilol (COREG) 6.25 MG tablet Take 6.25 mg by mouth 2 (two) times daily.   Yes Historical Provider, MD  clonazePAM (KLONOPIN) 1 MG tablet Take 0.5 mg by mouth 3 (three) times daily.  Yes Historical Provider, MD  clopidogrel (PLAVIX) 75 MG tablet Take 75 mg by mouth daily.   Yes Historical Provider, MD  divalproex (DEPAKOTE ER) 500 MG 24 hr tablet Take 500-750 mg by mouth 2 (two) times daily. Take 500 mg in the morning and 750 mg at night.   Yes Historical Provider, MD  furosemide (LASIX) 40 MG tablet Take 40 mg by mouth 2 (two) times daily.   Yes Historical Provider, MD  gabapentin (NEURONTIN) 300 MG capsule Take 300-600 mg by mouth 3 (three) times daily. Take 300 mg in the morning, 300 mg at 2 pm and 300 mg at night.   Yes Historical Provider, MD  hydrALAZINE (APRESOLINE) 25 MG tablet Take 25 mg by mouth 3 (three) times daily.   Yes Historical Provider, MD  Hyoscyamine Sulfate SL (LEVSIN/SL) 0.125  MG SUBL Place 1 tablet under the tongue daily. 07/07/16  Yes Shannon A McGowan, PA-C  ipratropium-albuterol (DUONEB) 0.5-2.5 (3) MG/3ML SOLN Take 3 mLs by nebulization every 6 (six) hours as needed (wheezing/shortness of breath).   Yes Historical Provider, MD  losartan (COZAAR) 50 MG tablet Take 50 mg by mouth daily.   Yes Historical Provider, MD  Melatonin 1 MG TABS Take 6 tablets by mouth at bedtime.    Yes Historical Provider, MD  nitroGLYCERIN (NITROSTAT) 0.4 MG SL tablet Place 0.4 mg under the tongue every 5 (five) minutes as needed for chest pain.   Yes Historical Provider, MD  omeprazole (PRILOSEC) 40 MG capsule Take 40 mg by mouth daily.   Yes Historical Provider, MD  potassium chloride SA (K-DUR,KLOR-CON) 20 MEQ tablet Take 20 mEq by mouth 2 (two) times daily.   Yes Historical Provider, MD  rivaroxaban (XARELTO) 20 MG TABS tablet Take 20 mg by mouth at bedtime.   Yes Historical Provider, MD  rosuvastatin (CRESTOR) 20 MG tablet Take 20 mg by mouth daily.   Yes Historical Provider, MD  tamsulosin (FLOMAX) 0.4 MG CAPS capsule TAKE ONE CAPSULE BY MOUTH EVERY DAY AFTER SUPPER 07/24/16  Yes Vanna Scotland, MD  traMADol (ULTRAM) 50 MG tablet Take 50 mg by mouth every 6 (six) hours as needed for moderate pain or severe pain.    Yes Historical Provider, MD  traZODone (DESYREL) 100 MG tablet Take 100 mg by mouth at bedtime. 09/23/16  Yes Historical Provider, MD     Review of Systems  Constitutional: Positive for fatigue. Negative for appetite change.  HENT: Negative for congestion, postnasal drip and sore throat.   Eyes: Negative.   Respiratory: Positive for shortness of breath. Negative for chest tightness.   Cardiovascular: Positive for leg swelling. Negative for chest pain and palpitations.  Gastrointestinal: Positive for abdominal distention. Negative for abdominal pain.  Endocrine: Negative.   Musculoskeletal: Positive for arthralgias (bilateral shoulder pain). Negative for back pain and neck  pain.  Skin: Negative.   Allergic/Immunologic: Negative.   Neurological: Positive for tremors and light-headedness (when changing positions too quickly). Negative for dizziness.       Memory issues   Hematological: Negative for adenopathy. Does not bruise/bleed easily.  Psychiatric/Behavioral: Negative for dysphoric mood and sleep disturbance (sleeping on 2 pillows; wearing oxygen at 2L around the clock). The patient is nervous/anxious.     Vitals:   10/29/16 0850  BP: 110/72  Pulse: 70  Resp: 20  SpO2: 99%  Weight: 272 lb (123.4 kg)  Height:  (1.702 m)   Wt Readings from Last 3 Encounters:  10/29/16 272 lb (123.4 kg)  10/26/16  275 lb (124.7 kg)  10/06/16 250 lb (113.4 kg)   Lab Results  Component Value Date   CREATININE 1.26 (H) 10/06/2016   CREATININE 1.25 (H) 07/27/2016   CREATININE 1.30 (H) 07/02/2016     Physical Exam  Constitutional: He is oriented to person, place, and time. He appears well-developed and well-nourished.  HENT:  Head: Normocephalic and atraumatic.  Neck: Normal range of motion. Neck supple. No JVD present.  Cardiovascular: Normal rate and regular rhythm.   Pulmonary/Chest: Effort normal. He has no wheezes. He has no rales.  Abdominal: Soft. He exhibits distension. There is no tenderness.  Musculoskeletal: He exhibits edema (2+ pitting edema in bilateral lower legs). He exhibits no tenderness.  Neurological: He is alert and oriented to person, place, and time.  Skin: Skin is warm and dry.  Psychiatric: He has a normal mood and affect. His behavior is normal.  Nursing note and vitals reviewed.     Assessment & Plan:  1: Chronic heart failure with reduced ejection fraction- - NYHA class II - mildly fluid overloaded  - has recently had his diuretic increased to furosemide  BID for an additional 2 days and then will decrease it back to  BID - hasn't been weighing but does have a scale. Instructed to weigh every morning, write the  weight down and call for an overnight weight gain of >2 pounds or a weekly weight gain of >5 pounds - not adding salt to his food. Reviewed a  sodium diet and written dietary information was given to him. Does occasionally eat fast food and is aware that it's high is sodium - has decreased his fluid intake down considerably over the last 3 days. Was drinking 128 ounces of fluid daily but is now down to 48 ounces daily and is working on decreasing it some more to 32 ounces daily - discussed possibly changing his losartan to entresto and/or changing his diuretic to torsemide to see if he responds better - saw cardiologist (Paraschos) 10/26/16 and returns to him in 3 months  2: Hypotension- - BP looks good today - saw PCP ([place]) 10/23/16  3: Obstructive sleep apnea- - wears oxygen at 2L around the clock - unable to tolerate wearing CPAP due to anxiety/claustrophobia  Patient did not bring his medications nor a list. Each medication was verbally reviewed with the patient and he was encouraged to bring the bottles to every visit to confirm accuracy of list.  Return in 2 weeks and will check a BMP at that time.

## 2016-10-29 NOTE — Patient Instructions (Addendum)
Begin weighing daily and call for an overnight weight gain of > 2 pounds or a weekly weight gain of >5 pounds. 

## 2016-10-30 ENCOUNTER — Encounter: Payer: Self-pay | Admitting: Speech Pathology

## 2016-10-30 NOTE — Therapy (Signed)
[place] [facility] MAIN [facility] SERVICES [address] [place], [place], [address] Phone: [phone]   Fax:  [phone]  Speech Language Pathology Treatment  Patient Details  Name: Kevin Pearson MRN: [id number] Date of Birth: Dec 23, 1950 Referring Provider: Dr. Cristopher Peru  Encounter Date: 10/29/2016      End of Session - 10/30/16 1022    Visit Number 4   Number of Visits 13   Date for SLP Re-Evaluation 12/01/16   SLP Start Time 1005   SLP Stop Time  1100   SLP Time Calculation (min) 55 min   Activity Tolerance Patient tolerated treatment well      Past Medical History:  Diagnosis Date  . Acid reflux   . Anxiety   . Arthritis   . Atrial fibrillation (HCC)   . CHF (congestive heart failure) (HCC)   . Chronic orthostatic hypotension   . Clotting disorder (HCC)   . COPD (chronic obstructive pulmonary disease) (HCC)   . Depression   . Elevated PSA   . Heart attack (HCC)   . Heart disease   . Heart failure (HCC)   . Hepatitis   . High cholesterol   . Hypertension   . Sleep apnea   . TBI (traumatic brain injury) (HCC)   . Urinary retention     Past Surgical History:  Procedure Laterality Date  . BLADDER SURGERY    . cardiac stents    . CYSTOSCOPY WITH URETHRAL DILATATION N/A 06/25/2016   Procedure: CYSTOSCOPY WITH URETHRAL DILATATION;  Surgeon: Vanna Scotland, MD;  Location: [facility] ORS;  Service: Urology;  Laterality: N/A;  . EP IMPLANTABLE DEVICE     St. Jude BiV-ICD  . GREEN LIGHT LASER TURP (TRANSURETHRAL RESECTION OF PROSTATE  2016   done in [place]     There were no vitals filed for this visit.      Subjective Assessment - 10/30/16 1021    Subjective "I think this will help" RE: compensatory strategies discussed today   Currently in Pain? No/denies               ADULT SLP TREATMENT - 10/30/16 0001      General Information   Behavior/Cognition Alert;Cooperative;Pleasant mood;Impulsive;Distractible;Decreased sustained  attention   HPI Pt is a 66 y/o male with a h/o COPD, congestive heart failure, obstructive sleep apnea and kidney disease. Pt fell in 08/2015 and was not found for 30 hours. Pt had broken ribs and severe confusion after the fall which have improved. However pt continues to have difficulty with memory. Per MD noted pt has cognitive impairment d/t likely anoxic ischemia from fall plus a h/o TBI, long standing COPD, chronic pain with pain medications and benzodiazapine use and history of heavy alcohol use and polysubstance use.      Treatment Provided   Treatment provided Cognitive-Linquistic     Pain Assessment   Pain Assessment No/denies pain     Cognitive-Linquistic Treatment   Treatment focused on Cognition   Skilled Treatment COGNITIVE BARRIERS: The patient was not able to complete his assignments due to not being able to problem solve when first option could not be completed.  The patient participated in problem solving solutions to improve effectiveness in this task.  PATIENT ASSIGNMENTS: Patient could not complete assignments given last session.  New assignments (1) Patient to select full size weekly/monthly planner and similar sized spiral notebooknotebook.  (2) transfer information from previous planners (3) begin meal planning section (4) get help to select a GPS  phone app.     Assessment / Recommendations / Plan   Plan Continue with current plan of care     Progression Toward Goals   Progression toward goals Progressing toward goals          SLP Education - 10/30/16 1021    Education provided Yes   Education Details identifying barriers and problem solving potential compensatory strategies   Person(s) Educated Patient   Methods Explanation   Comprehension Verbalized understanding            SLP Long Term Goals - 10/20/16 1319      SLP LONG TERM GOAL #1   Title Patient will demonstrate functional use of external memory aids with 80% acc within structured settins.     Baseline 0   Time 6   Period Weeks   Status New     SLP LONG TERM GOAL #2   Title Pt will demonstrate functional cognitive-communication skills for independent completion of personal responsibilities.    Baseline 0   Time 6   Period Weeks   Status New     SLP LONG TERM GOAL #3   Title Pt wil complete executive function skills tasks with 80% acc.    Baseline 0   Time 6   Period Weeks   Status New     SLP LONG TERM GOAL #4   Title Patient will complete memory strategy activities with 80% acc.    Baseline 0   Time 6   Period Weeks   Status New          Plan - 10/30/16 1023    Clinical Impression Statement The patient is actively participating in identifying cognitive barriers and problem solving effective strategies.   Speech Therapy Frequency 2x / week   Duration Other (comment)   Treatment/Interventions Functional tasks;Cognitive reorganization;SLP instruction and feedback;Compensatory strategies;Patient/family education;Internal/external aids   Potential Considerations Family/community support;Co-morbidities   SLP Home Exercise Plan Written assignments given   Consulted and Agree with Plan of Care Patient      Patient will benefit from skilled therapeutic intervention in order to improve the following deficits and impairments:   Cognitive communication deficit    Problem List Patient Active Problem List   Diagnosis Date Noted  . Hypotension 10/29/2016  . Obstructive sleep apnea 10/29/2016  . Chronic bilateral low back pain 08/27/2016  . H/O hypotension 07/29/2016  . Chronic obstructive pulmonary disease (HCC) 07/16/2016  . Chronic systolic congestive heart failure (HCC) 07/16/2016  . Peripheral neuropathy 07/16/2016  . Anxiety and depression 07/16/2016  . Arthralgia of right foot 07/16/2016  . Urethral stricture 07/16/2016  . Urinary retention 06/24/2016   Dollene Primrose, MS/CCC- SLP  Leandrew Koyanagi 10/30/2016, 10:24 AM  [facility] MAIN [facility] SERVICES [address] [place], [place], [address] Phone: [phone]   Fax:  [phone]   Name: Derak Schurman MRN: [id number] Date of Birth: 1950-10-13

## 2016-11-03 ENCOUNTER — Ambulatory Visit: Payer: BLUE CROSS/BLUE SHIELD | Attending: Neurology | Admitting: Speech Pathology

## 2016-11-03 ENCOUNTER — Encounter: Payer: Self-pay | Admitting: Speech Pathology

## 2016-11-03 DIAGNOSIS — R41841 Cognitive communication deficit: Secondary | ICD-10-CM

## 2016-11-03 NOTE — Therapy (Signed)
[facility] MAIN [facility] SERVICES [address] [place], [place], [address] Phone: [phone]   Fax:  [phone]  Speech Language Pathology Treatment  Patient Details  Name: Kevin Pearson MRN: [id number] Date of Birth: 12-Feb-1951 Referring Provider: Dr. Cristopher Peru  Encounter Date: 11/03/2016      End of Session - 11/03/16 1109    Visit Number 5   Number of Visits 13   Date for SLP Re-Evaluation 12/01/16   SLP Start Time 1000   SLP Stop Time  1055   SLP Time Calculation (min) 55 min   Activity Tolerance Patient tolerated treatment well      Past Medical History:  Diagnosis Date  . Acid reflux   . Anxiety   . Arthritis   . Atrial fibrillation (HCC)   . CHF (congestive heart failure) (HCC)   . Chronic orthostatic hypotension   . Clotting disorder (HCC)   . COPD (chronic obstructive pulmonary disease) (HCC)   . Depression   . Elevated PSA   . Heart attack (HCC)   . Heart disease   . Heart failure (HCC)   . Hepatitis   . High cholesterol   . Hypertension   . Sleep apnea   . TBI (traumatic brain injury) (HCC)   . Urinary retention     Past Surgical History:  Procedure Laterality Date  . BLADDER SURGERY    . cardiac stents    . CYSTOSCOPY WITH URETHRAL DILATATION N/A 06/25/2016   Procedure: CYSTOSCOPY WITH URETHRAL DILATATION;  Surgeon: Vanna Scotland, MD;  Location: [facility] ORS;  Service: Urology;  Laterality: N/A;  . EP IMPLANTABLE DEVICE     St. Jude BiV-ICD  . GREEN LIGHT LASER TURP (TRANSURETHRAL RESECTION OF PROSTATE  2016   done in [place]     There were no vitals filed for this visit.      Subjective Assessment - 11/03/16 1109    Subjective "I think this will help" RE: compensatory strategies discussed today   Currently in Pain? No/denies               ADULT SLP TREATMENT - 11/03/16 0001      General Information   Behavior/Cognition Alert;Cooperative;Pleasant mood;Impulsive;Distractible;Decreased sustained  attention   HPI Pt is a 66 y/o male with a h/o COPD, congestive heart failure, obstructive sleep apnea and kidney disease. Pt fell in 08/2015 and was not found for 30 hours. Pt had broken ribs and severe confusion after the fall which have improved. However pt continues to have difficulty with memory. Per MD noted pt has cognitive impairment d/t likely anoxic ischemia from fall plus a h/o TBI, long standing COPD, chronic pain with pain medications and benzodiazapine use and history of heavy alcohol use and polysubstance use.      Treatment Provided   Treatment provided Cognitive-Linquistic     Pain Assessment   Pain Assessment No/denies pain     Cognitive-Linquistic Treatment   Treatment focused on Cognition   Skilled Treatment COGNITIVE BARRIERS: The patient was not able to complete his assignments due to not being able to problem solve when first option could not be completed.  The patient participated in problem solving solutions to improve effectiveness in this task.  The patient identified grocery shopping with his ex-wife; she forgets items and there is confusion at the checkout line.  Patient participated in problem-solving strategies and took notes.  PATIENT ASSIGNMENTS: Patient could not complete assignments given last session.  New assignments (1) Patient to  bring all books/calendars so we can be sure all information is put into his selected planner  (2) transfer information from previous planners (3) begin meal planning section (4) get help to select a GPS phone app.     Assessment / Recommendations / Plan   Plan Continue with current plan of care     Progression Toward Goals   Progression toward goals Progressing toward goals          SLP Education - 11/03/16 1109    Education provided Yes   Education Details identifying barriers and problem solving potential compensatory strategies   Person(s) Educated Patient   Methods Explanation   Comprehension Verbalized understanding             SLP Long Term Goals - 10/20/16 1319      SLP LONG TERM GOAL #1   Title Patient will demonstrate functional use of external memory aids with 80% acc within structured settins.    Baseline 0   Time 6   Period Weeks   Status New     SLP LONG TERM GOAL #2   Title Pt will demonstrate functional cognitive-communication skills for independent completion of personal responsibilities.    Baseline 0   Time 6   Period Weeks   Status New     SLP LONG TERM GOAL #3   Title Pt wil complete executive function skills tasks with 80% acc.    Baseline 0   Time 6   Period Weeks   Status New     SLP LONG TERM GOAL #4   Title Patient will complete memory strategy activities with 80% acc.    Baseline 0   Time 6   Period Weeks   Status New          Plan - 11/03/16 1110    Clinical Impression Statement The patient is actively participating in identifying cognitive barriers and problem solving effective strategies.   Speech Therapy Frequency 2x / week   Duration Other (comment)   Treatment/Interventions Functional tasks;Cognitive reorganization;SLP instruction and feedback;Compensatory strategies;Patient/family education;Internal/external aids   Potential to Achieve Goals Good   Potential Considerations Family/community support;Co-morbidities   SLP Home Exercise Plan Written assignments given   Consulted and Agree with Plan of Care Patient      Patient will benefit from skilled therapeutic intervention in order to improve the following deficits and impairments:   Cognitive communication deficit    Problem List Patient Active Problem List   Diagnosis Date Noted  . Hypotension 10/29/2016  . Obstructive sleep apnea 10/29/2016  . Chronic bilateral low back pain 08/27/2016  . H/O hypotension 07/29/2016  . Chronic obstructive pulmonary disease (HCC) 07/16/2016  . Chronic systolic congestive heart failure (HCC) 07/16/2016  . Peripheral neuropathy 07/16/2016  . Anxiety and  depression 07/16/2016  . Arthralgia of right foot 07/16/2016  . Urethral stricture 07/16/2016  . Urinary retention 06/24/2016   Kevin Primrose, MS/CCC- SLP  Leandrew Koyanagi 11/03/2016, 11:11 AM  [facility] MAIN [facility] SERVICES [address] [place], [place], [address] Phone: [phone]   Fax:  [phone]   Name: Maceo Hernan MRN: [id number] Date of Birth: 1950-09-19

## 2016-11-05 ENCOUNTER — Encounter: Payer: Self-pay | Admitting: Speech Pathology

## 2016-11-05 ENCOUNTER — Ambulatory Visit: Payer: BLUE CROSS/BLUE SHIELD | Admitting: Speech Pathology

## 2016-11-05 DIAGNOSIS — R41841 Cognitive communication deficit: Secondary | ICD-10-CM | POA: Diagnosis not present

## 2016-11-05 NOTE — Therapy (Signed)
[facility] [address] [place], [place], [address] Phone: [phone]   Fax:  [phone]  Speech Language Pathology Treatment  Patient Details  Name: Kevin EngCarmen Pearson MRN: [id number] Date of Birth: 1950/08/07 Referring Provider: Dr. Cristopher PeruHemang Shah  Encounter Date: 11/05/2016      End of Session - 11/05/16 1448    Visit Number 6   Number of Visits 13   Date for SLP Re-Evaluation 12/01/16   SLP Start Time 0945   SLP Stop Time  1045   SLP Time Calculation (min) 60 min   Activity Tolerance Patient tolerated treatment well      Past Medical History:  Diagnosis Date  . Acid reflux   . Anxiety   . Arthritis   . Atrial fibrillation (HCC)   . CHF (congestive heart failure) (HCC)   . Chronic orthostatic hypotension   . Clotting disorder (HCC)   . COPD (chronic obstructive pulmonary disease) (HCC)   . Depression   . Elevated PSA   . Heart attack (HCC)   . Heart disease   . Heart failure (HCC)   . Hepatitis   . High cholesterol   . Hypertension   . Sleep apnea   . TBI (traumatic brain injury) (HCC)   . Urinary retention     Past Surgical History:  Procedure Laterality Date  . BLADDER SURGERY    . cardiac stents    . CYSTOSCOPY WITH URETHRAL DILATATION N/A 06/25/2016   Procedure: CYSTOSCOPY WITH URETHRAL DILATATION;  Surgeon: Vanna ScotlandAshley Brandon, MD;  Location: [facility] ORS;  Service: Urology;  Laterality: N/A;  . EP IMPLANTABLE DEVICE     St. Jude BiV-ICD  . GREEN LIGHT LASER TURP (TRANSURETHRAL RESECTION OF PROSTATE  2016   [place] in [place]     There were no vitals filed for this visit.      Subjective Assessment - 11/05/16 1448    Subjective "I think this will help" RE: compensatory strategies discussed today   Currently in Pain? No/denies               ADULT SLP TREATMENT - 11/05/16 0001      General Information   Behavior/Cognition Alert;Cooperative;Pleasant mood;Impulsive;Distractible;Decreased sustained  attention   HPI Pt is a 66 y/o male with a h/o COPD, congestive heart failure, obstructive sleep apnea and kidney disease. Pt fell in 08/2015 and was not found for 30 hours. Pt had broken ribs and severe confusion after the fall which have improved. However pt continues to have difficulty with memory. Per MD noted pt has cognitive impairment d/t likely anoxic ischemia from fall plus a h/o TBI, long standing COPD, chronic pain with pain medications and benzodiazapine use and history of heavy alcohol use and polysubstance use.      Treatment Provided   Treatment provided Cognitive-Linquistic     Pain Assessment   Pain Assessment No/denies pain     Cognitive-Linquistic Treatment   Treatment focused on Cognition   Skilled Treatment COGNITIVE BARRIERS: The patient brought all his competing planners and notebooks as requested.  He has a bag in which they all fit and is easy/convenient to carry.  The patient has not been able to settle on one planner, so SLP designated the largest as the best option.  This, along with a notebook, will serve as his one organizing/planning system.  He is to begin transferring contact information from the various books to the one.     Assessment / Recommendations / Plan  Plan Continue with current plan of care     Progression Toward Goals   Progression toward goals Progressing toward goals          SLP Education - 11/05/16 1448    Education Details identifying barriers and problem solving potential compensatory strategies   Person(s) Educated Patient   Methods Explanation   Comprehension Verbalized understanding            SLP Long Term Goals - 10/20/16 1319      SLP LONG TERM GOAL #1   Title Patient will demonstrate functional use of external memory aids with 80% acc within structured settins.    Baseline 0   Time 6   Period Weeks   Status New     SLP LONG TERM GOAL #2   Title Pt will demonstrate functional cognitive-communication skills for  independent completion of personal responsibilities.    Baseline 0   Time 6   Period Weeks   Status New     SLP LONG TERM GOAL #3   Title Pt wil complete executive function skills tasks with 80% acc.    Baseline 0   Time 6   Period Weeks   Status New     SLP LONG TERM GOAL #4   Title Patient will complete memory strategy activities with 80% acc.    Baseline 0   Time 6   Period Weeks   Status New          Plan - 11/05/16 1449    Clinical Impression Statement The patient is actively participating in identifying cognitive barriers and problem solving effective strategies.  He is having difficulty selecting an organizing system, requiring more direction from SLP today.   Speech Therapy Frequency 2x / week   Duration Other (comment)   Treatment/Interventions Functional tasks;Cognitive reorganization;SLP instruction and feedback;Compensatory strategies;Patient/family education;Internal/external aids   Potential to Achieve Goals Good   Potential Considerations Family/community support;Co-morbidities   SLP Home Exercise Plan Written assignments given   Consulted and Agree with Plan of Care Patient      Patient will benefit from skilled therapeutic intervention in order to improve the following deficits and impairments:   Cognitive communication deficit    Problem List Patient Active Problem List   Diagnosis Date Noted  . Hypotension 10/29/2016  . Obstructive sleep apnea 10/29/2016  . Chronic bilateral low back pain 08/27/2016  . H/O hypotension 07/29/2016  . Chronic obstructive pulmonary disease (HCC) 07/16/2016  . Chronic systolic congestive heart failure (HCC) 07/16/2016  . Peripheral neuropathy 07/16/2016  . Anxiety and depression 07/16/2016  . Arthralgia of right foot 07/16/2016  . Urethral stricture 07/16/2016  . Urinary retention 06/24/2016   Dollene Primrose, MS/CCC- SLP  Kevin Pearson 11/05/2016, 2:50 PM  [facility]  MAIN [facility] SERVICES [address] [place], [place], [address] Phone: [phone]   Fax:  [phone]   Name: Kevin Pearson MRN: [id number] Date of Birth: [birth year]

## 2016-11-10 ENCOUNTER — Ambulatory Visit: Payer: BLUE CROSS/BLUE SHIELD | Admitting: Speech Pathology

## 2016-11-10 DIAGNOSIS — R41841 Cognitive communication deficit: Secondary | ICD-10-CM

## 2016-11-11 ENCOUNTER — Encounter: Payer: Self-pay | Admitting: Speech Pathology

## 2016-11-11 NOTE — Therapy (Signed)
[facility] MAIN [facility] SERVICES [address] [place], [place], [address] Phone: [phone]   Fax:  [phone]  Speech Language Pathology Treatment  Patient Details  Name: Kevin Pearson MRN: [id number] Date of Birth: 30-Sep-1950 Referring Provider: Dr. Cristopher Peru  Encounter Date: 11/10/2016      End of Session - 11/11/16 1054    Visit Number 7   Number of Visits 13   Date for SLP Re-Evaluation 12/01/16   SLP Start Time 1000   SLP Stop Time  1045   SLP Time Calculation (min) 45 min   Activity Tolerance Patient tolerated treatment well      Past Medical History:  Diagnosis Date  . Acid reflux   . Anxiety   . Arthritis   . Atrial fibrillation (HCC)   . CHF (congestive heart failure) (HCC)   . Chronic orthostatic hypotension   . Clotting disorder (HCC)   . COPD (chronic obstructive pulmonary disease) (HCC)   . Depression   . Elevated PSA   . Heart attack (HCC)   . Heart disease   . Heart failure (HCC)   . Hepatitis   . High cholesterol   . Hypertension   . Sleep apnea   . TBI (traumatic brain injury) (HCC)   . Urinary retention     Past Surgical History:  Procedure Laterality Date  . BLADDER SURGERY    . cardiac stents    . CYSTOSCOPY WITH URETHRAL DILATATION N/A 06/25/2016   Procedure: CYSTOSCOPY WITH URETHRAL DILATATION;  Surgeon: Vanna Scotland, MD;  Location: [facility] ORS;  Service: Urology;  Laterality: N/A;  . EP IMPLANTABLE DEVICE     St. Jude BiV-ICD  . GREEN LIGHT LASER TURP (TRANSURETHRAL RESECTION OF PROSTATE  2016   [place] in [place]     There were no vitals filed for this visit.      Subjective Assessment - 11/11/16 1054    Subjective "I think this will help" RE: compensatory strategies discussed today   Currently in Pain? No/denies               ADULT SLP TREATMENT - 11/11/16 0001      General Information   Behavior/Cognition Alert;Cooperative;Pleasant mood;Impulsive;Distractible;Decreased sustained  attention   HPI Pt is a 66 y/o male with a h/o COPD, congestive heart failure, obstructive sleep apnea and kidney disease. Pt fell in 08/2015 and was not found for 30 hours. Pt had broken ribs and severe confusion after the fall which have improved. However pt continues to have difficulty with memory. Per MD noted pt has cognitive impairment d/t likely anoxic ischemia from fall plus a h/o TBI, long standing COPD, chronic pain with pain medications and benzodiazapine use and history of heavy alcohol use and polysubstance use.      Treatment Provided   Treatment provided Cognitive-Linquistic     Pain Assessment   Pain Assessment No/denies pain     Cognitive-Linquistic Treatment   Treatment focused on Cognition   Skilled Treatment COGNITIVE BARRIERS: The patient has selected a Building control surveyor and composition book.  He has a storage clipboard in which they fit, is easy/convenient to carry, and can carry office supplies.  The notebook has numbered pages and he has begun an index to identify where he is taking notes/recording information.  He is to begin transferring contact information from the various books to the one.     Assessment / Recommendations / Plan   Plan Continue with current plan of care  Progression Toward Goals   Progression toward goals Progressing toward goals          SLP Education - 11/11/16 1054    Education provided Yes   Education Details identifying barriers and problem solving potential compensatory strategies   Person(s) Educated Patient   Methods Explanation   Comprehension Verbalized understanding            SLP Long Term Goals - 10/20/16 1319      SLP LONG TERM GOAL #1   Title Patient will demonstrate functional use of external memory aids with 80% acc within structured settins.    Baseline 0   Time 6   Period Weeks   Status New     SLP LONG TERM GOAL #2   Title Pt will demonstrate functional cognitive-communication skills for independent  completion of personal responsibilities.    Baseline 0   Time 6   Period Weeks   Status New     SLP LONG TERM GOAL #3   Title Pt wil complete executive function skills tasks with 80% acc.    Baseline 0   Time 6   Period Weeks   Status New     SLP LONG TERM GOAL #4   Title Patient will complete memory strategy activities with 80% acc.    Baseline 0   Time 6   Period Weeks   Status New          Plan - 11/11/16 1055    Clinical Impression Statement The patient is actively participating in identifying cognitive barriers and problem solving effective strategies.  He has selected an organizing system.   Speech Therapy Frequency 2x / week   Duration Other (comment)   Treatment/Interventions Functional tasks;Cognitive reorganization;SLP instruction and feedback;Compensatory strategies;Patient/family education;Internal/external aids   Potential to Achieve Goals Good   Potential Considerations Family/community support;Co-morbidities   SLP Home Exercise Plan Written assignments given   Consulted and Agree with Plan of Care Patient      Patient will benefit from skilled therapeutic intervention in order to improve the following deficits and impairments:   Cognitive communication deficit    Problem List Patient Active Problem List   Diagnosis Date Noted  . Hypotension 10/29/2016  . Obstructive sleep apnea 10/29/2016  . Chronic bilateral low back pain 08/27/2016  . H/O hypotension 07/29/2016  . Chronic obstructive pulmonary disease (HCC) 07/16/2016  . Chronic systolic congestive heart failure (HCC) 07/16/2016  . Peripheral neuropathy 07/16/2016  . Anxiety and depression 07/16/2016  . Arthralgia of right foot 07/16/2016  . Urethral stricture 07/16/2016  . Urinary retention 06/24/2016   Dollene PrimroseSusan G Gryphon Vanderveen, MS/CCC- SLP  Leandrew KoyanagiAbernathy, Susie 11/11/2016, 10:56 AM  [facility] [address] [place], [place], [address] Phone:  [phone]   Fax:  [phone]   Name: Camelia EngCarmen Sciascia MRN: [id number] Date of Birth: 08-14-1950

## 2016-11-12 ENCOUNTER — Ambulatory Visit: Payer: BLUE CROSS/BLUE SHIELD | Admitting: Family

## 2016-11-12 ENCOUNTER — Encounter: Payer: Self-pay | Admitting: Speech Pathology

## 2016-11-12 ENCOUNTER — Ambulatory Visit: Payer: BLUE CROSS/BLUE SHIELD | Admitting: Speech Pathology

## 2016-11-12 DIAGNOSIS — R41841 Cognitive communication deficit: Secondary | ICD-10-CM | POA: Diagnosis not present

## 2016-11-12 NOTE — Therapy (Signed)
[place] [facility] MAIN [facility] SERVICES [address] [place], [place], [address] Phone: [phone]   Fax:  [phone]  Speech Language Pathology Treatment  Patient Details  Name: Abdinasir Spadafore MRN: [id number] Date of Birth: [birth year] Referring Provider: Dr. Cristopher Peru  Encounter Date: 11/12/2016      End of Session - 11/12/16 1530    Visit Number 8   Number of Visits 13   Date for SLP Re-Evaluation 12/01/16   SLP Start Time 1000   SLP Stop Time  1100   SLP Time Calculation (min) 60 min   Activity Tolerance Patient tolerated treatment well      Past Medical History:  Diagnosis Date  . Acid reflux   . Anxiety   . Arthritis   . Atrial fibrillation (HCC)   . CHF (congestive heart failure) (HCC)   . Chronic orthostatic hypotension   . Clotting disorder (HCC)   . COPD (chronic obstructive pulmonary disease) (HCC)   . Depression   . Elevated PSA   . Heart attack (HCC)   . Heart disease   . Heart failure (HCC)   . Hepatitis   . High cholesterol   . Hypertension   . Sleep apnea   . TBI (traumatic brain injury) (HCC)   . Urinary retention     Past Surgical History:  Procedure Laterality Date  . BLADDER SURGERY    . cardiac stents    . CYSTOSCOPY WITH URETHRAL DILATATION N/A 06/25/2016   Procedure: CYSTOSCOPY WITH URETHRAL DILATATION;  Surgeon: Vanna Scotland, MD;  Location: [facility] ORS;  Service: Urology;  Laterality: N/A;  . EP IMPLANTABLE DEVICE     St. Jude BiV-ICD  . GREEN LIGHT LASER TURP (TRANSURETHRAL RESECTION OF PROSTATE  2016   done in [place]     There were no vitals filed for this visit.      Subjective Assessment - 11/12/16 1529    Subjective Patient was anxious today   Currently in Pain? No/denies               ADULT SLP TREATMENT - 11/12/16 0001      General Information   Behavior/Cognition Alert;Cooperative;Pleasant mood;Impulsive;Distractible;Decreased sustained attention   HPI Pt is a 66 y/o male with a  h/o COPD, congestive heart failure, obstructive sleep apnea and kidney disease. Pt fell in 08/2015 and was not found for 30 hours. Pt had broken ribs and severe confusion after the fall which have improved. However pt continues to have difficulty with memory. Per MD noted pt has cognitive impairment d/t likely anoxic ischemia from fall plus a h/o TBI, long standing COPD, chronic pain with pain medications and benzodiazapine use and history of heavy alcohol use and polysubstance use.      Treatment Provided   Treatment provided Cognitive-Linquistic     Pain Assessment   Pain Assessment No/denies pain     Cognitive-Linquistic Treatment   Treatment focused on Cognition   Skilled Treatment COGNITIVE BARRIERS:  (1) The patient came today without his oxygen.  He stated that his portable unit would not charge because it drew down his car battery and that is the only way he knows how to keep the unit charged.  The patient required max cues/guidance to acknowledge that having continuous oxygen must be the priority.  He was able to borrow a portable tank from [facility] (across the hall).  (2) The patient then identified problem with scheduling secondary to not having his planner with him when he  made an appointment.  He required max SLP cues to prioritize/choose appointment to keep, reason how he will get to the chosen appointment, and how to inform the other appointment he needed to reschedule. (3) The patient did not have his planner with him, although he did have his storage clipboard and notebook.  He agrees to keep his planner and notebook in the storage clipboard at all times and to have his storage clipboard with him at all times.  (4) The patient use a piece of scrap paper to write himself an important message.  He was guided to use the notebook for such notes.     Assessment / Recommendations / Plan   Plan Continue with current plan of care     Progression Toward Goals   Progression toward goals  Progressing toward goals          SLP Education - 11/12/16 1529    Education provided Yes   Education Details identifying barriers and problem solving potential compensatory strategies   Person(s) Educated Patient   Methods Explanation   Comprehension Verbalized understanding            SLP Long Term Goals - 10/20/16 1319      SLP LONG TERM GOAL #1   Title Patient will demonstrate functional use of external memory aids with 80% acc within structured settins.    Baseline 0   Time 6   Period Weeks   Status New     SLP LONG TERM GOAL #2   Title Pt will demonstrate functional cognitive-communication skills for independent completion of personal responsibilities.    Baseline 0   Time 6   Period Weeks   Status New     SLP LONG TERM GOAL #3   Title Pt wil complete executive function skills tasks with 80% acc.    Baseline 0   Time 6   Period Weeks   Status New     SLP LONG TERM GOAL #4   Title Patient will complete memory strategy activities with 80% acc.    Baseline 0   Time 6   Period Weeks   Status New          Plan - 11/12/16 1530    Clinical Impression Statement The patient had a stressful morning creating anxiety.  This left him unable to make prudent choices and decisions.  Given cues/guidance from SLP to establish priorities and problem solve functional solutions he was able to calm and participate in the process.   Speech Therapy Frequency 2x / week   Duration Other (comment)   Treatment/Interventions Functional tasks;Cognitive reorganization;SLP instruction and feedback;Compensatory strategies;Patient/family education;Internal/external aids   Potential to Achieve Goals Good   Potential Considerations Family/community support;Co-morbidities   SLP Home Exercise Plan Written assignments given   Consulted and Agree with Plan of Care Patient      Patient will benefit from skilled therapeutic intervention in order to improve the following deficits and  impairments:   Cognitive communication deficit    Problem List Patient Active Problem List   Diagnosis Date Noted  . Hypotension 10/29/2016  . Obstructive sleep apnea 10/29/2016  . Chronic bilateral low back pain 08/27/2016  . H/O hypotension 07/29/2016  . Chronic obstructive pulmonary disease (HCC) 07/16/2016  . Chronic systolic congestive heart failure (HCC) 07/16/2016  . Peripheral neuropathy 07/16/2016  . Anxiety and depression 07/16/2016  . Arthralgia of right foot 07/16/2016  . Urethral stricture 07/16/2016  . Urinary retention 06/24/2016   Dollene Primrose,  MS/CCC- SLP  Leandrew KoyanagiAbernathy, Susie 11/12/2016, 3:32 PM  [place] [facility] MAIN [facility] SERVICES [address] [place], [place], [address] Phone: [phone]   Fax:  [phone]   Name: Camelia EngCarmen Barbe MRN: [id number] Date of Birth: 1950/11/15

## 2016-11-17 ENCOUNTER — Ambulatory Visit: Payer: BLUE CROSS/BLUE SHIELD | Admitting: Speech Pathology

## 2016-11-17 ENCOUNTER — Encounter: Payer: Self-pay | Admitting: Speech Pathology

## 2016-11-17 DIAGNOSIS — R41841 Cognitive communication deficit: Secondary | ICD-10-CM | POA: Diagnosis not present

## 2016-11-17 NOTE — Therapy (Signed)
[place] [facility] MAIN [facility] SERVICES [address] [place], [place], [address] Phone: [phone]   Fax:  [phone]  Speech Language Pathology Treatment  Patient Details  Name: Kevin Pearson MRN: [id number] Date of Birth: Oct 16, 1950 Referring Provider: Dr. Cristopher Peru  Encounter Date: 11/17/2016      End of Session - 11/17/16 1150    Visit Number 9   Number of Visits 13   Date for SLP Re-Evaluation 12/01/16   SLP Start Time 1000   SLP Stop Time  1100   SLP Time Calculation (min) 60 min   Activity Tolerance Patient tolerated treatment well      Past Medical History:  Diagnosis Date  . Acid reflux   . Anxiety   . Arthritis   . Atrial fibrillation (HCC)   . CHF (congestive heart failure) (HCC)   . Chronic orthostatic hypotension   . Clotting disorder (HCC)   . COPD (chronic obstructive pulmonary disease) (HCC)   . Depression   . Elevated PSA   . Heart attack (HCC)   . Heart disease   . Heart failure (HCC)   . Hepatitis   . High cholesterol   . Hypertension   . Sleep apnea   . TBI (traumatic brain injury) (HCC)   . Urinary retention     Past Surgical History:  Procedure Laterality Date  . BLADDER SURGERY    . cardiac stents    . CYSTOSCOPY WITH URETHRAL DILATATION N/A 06/25/2016   Procedure: CYSTOSCOPY WITH URETHRAL DILATATION;  Surgeon: Vanna Scotland, MD;  Location: [facility] ORS;  Service: Urology;  Laterality: N/A;  . EP IMPLANTABLE DEVICE     St. Jude BiV-ICD  . GREEN LIGHT LASER TURP (TRANSURETHRAL RESECTION OF PROSTATE  2016   done in [place]     There were no vitals filed for this visit.      Subjective Assessment - 11/17/16 1146    Subjective The patient stated he felt organized today   Currently in Pain? No/denies               ADULT SLP TREATMENT - 11/17/16 0001      General Information   Behavior/Cognition Alert;Cooperative;Pleasant mood;Impulsive;Distractible;Decreased sustained attention   HPI Pt is a  66 y/o male with a h/o COPD, congestive heart failure, obstructive sleep apnea and kidney disease. Pt fell in 08/2015 and was not found for 30 hours. Pt had broken ribs and severe confusion after the fall which have improved. However pt continues to have difficulty with memory. Per MD noted pt has cognitive impairment d/t likely anoxic ischemia from fall plus a h/o TBI, long standing COPD, chronic pain with pain medications and benzodiazapine use and history of heavy alcohol use and polysubstance use.      Treatment Provided   Treatment provided Cognitive-Linquistic     Pain Assessment   Pain Assessment No/denies pain     Cognitive-Linquistic Treatment   Treatment focused on Cognition   Skilled Treatment COGNITIVE BARRIERS:  The patient had his oxygen in place, his storage clipboard, and his planner with notebook.  The patient has been using the planner and the notebook.  Overall, the patient is much less anxious / stressed and is organized.  The patient was able to state what tasks he needs to accomplish over the next week and indicate a plan.     Assessment / Recommendations / Plan   Plan Continue with current plan of care     Progression Toward Goals  Progression toward goals Progressing toward goals          SLP Education - 11/17/16 1146    Education provided Yes   Education Details identifying barriers and problem solving potential compensatory strategies   Person(s) Educated Patient   Methods Explanation   Comprehension Verbalized understanding            SLP Long Term Goals - 10/20/16 1319      SLP LONG TERM GOAL #1   Title Patient will demonstrate functional use of external memory aids with 80% acc within structured settins.    Baseline 0   Time 6   Period Weeks   Status New     SLP LONG TERM GOAL #2   Title Pt will demonstrate functional cognitive-communication skills for independent completion of personal responsibilities.    Baseline 0   Time 6   Period  Weeks   Status New     SLP LONG TERM GOAL #3   Title Pt wil complete executive function skills tasks with 80% acc.    Baseline 0   Time 6   Period Weeks   Status New     SLP LONG TERM GOAL #4   Title Patient will complete memory strategy activities with 80% acc.    Baseline 0   Time 6   Period Weeks   Status New          Plan - 11/17/16 1151    Clinical Impression Statement The patient was organized, with portable oxygen in place and his planner/notebook together in his storage clipboard.  He was calm and able to participate in the session with less internal distraction.   Speech Therapy Frequency 2x / week   Duration Other (comment)   Treatment/Interventions Functional tasks;Cognitive reorganization;SLP instruction and feedback;Compensatory strategies;Patient/family education;Internal/external aids   Potential to Achieve Goals Good   Potential Considerations Family/community support;Co-morbidities   SLP Home Exercise Plan Written assignments given   Consulted and Agree with Plan of Care Patient      Patient will benefit from skilled therapeutic intervention in order to improve the following deficits and impairments:   Cognitive communication deficit    Problem List Patient Active Problem List   Diagnosis Date Noted  . Hypotension 10/29/2016  . Obstructive sleep apnea 10/29/2016  . Chronic bilateral low back pain 08/27/2016  . H/O hypotension 07/29/2016  . Chronic obstructive pulmonary disease (HCC) 07/16/2016  . Chronic systolic congestive heart failure (HCC) 07/16/2016  . Peripheral neuropathy 07/16/2016  . Anxiety and depression 07/16/2016  . Arthralgia of right foot 07/16/2016  . Urethral stricture 07/16/2016  . Urinary retention 06/24/2016   Dollene PrimroseSusan G Trinette Vera, MS/CCC- SLP  Leandrew KoyanagiAbernathy, Susie 11/17/2016, 11:53 AM  [place] [facility] MAIN [facility] [address] [place], [place], [address] Phone: [phone]   Fax:   [phone]   Name: Kevin EngCarmen Pearson MRN: [id number] Date of Birth: January 02, 1951

## 2016-11-19 ENCOUNTER — Ambulatory Visit: Payer: BLUE CROSS/BLUE SHIELD | Attending: Family | Admitting: Family

## 2016-11-19 ENCOUNTER — Ambulatory Visit: Payer: BLUE CROSS/BLUE SHIELD | Admitting: Speech Pathology

## 2016-11-19 ENCOUNTER — Encounter: Payer: Self-pay | Admitting: Family

## 2016-11-19 VITALS — BP 121/72 | HR 69 | Resp 20 | Ht 66.93 in | Wt 276.0 lb

## 2016-11-19 DIAGNOSIS — K219 Gastro-esophageal reflux disease without esophagitis: Secondary | ICD-10-CM | POA: Insufficient documentation

## 2016-11-19 DIAGNOSIS — I959 Hypotension, unspecified: Secondary | ICD-10-CM | POA: Insufficient documentation

## 2016-11-19 DIAGNOSIS — E7800 Pure hypercholesterolemia, unspecified: Secondary | ICD-10-CM | POA: Diagnosis not present

## 2016-11-19 DIAGNOSIS — R42 Dizziness and giddiness: Secondary | ICD-10-CM | POA: Diagnosis not present

## 2016-11-19 DIAGNOSIS — Z8042 Family history of malignant neoplasm of prostate: Secondary | ICD-10-CM | POA: Insufficient documentation

## 2016-11-19 DIAGNOSIS — Z9981 Dependence on supplemental oxygen: Secondary | ICD-10-CM | POA: Insufficient documentation

## 2016-11-19 DIAGNOSIS — F419 Anxiety disorder, unspecified: Secondary | ICD-10-CM | POA: Diagnosis not present

## 2016-11-19 DIAGNOSIS — I4891 Unspecified atrial fibrillation: Secondary | ICD-10-CM | POA: Diagnosis not present

## 2016-11-19 DIAGNOSIS — Z888 Allergy status to other drugs, medicaments and biological substances status: Secondary | ICD-10-CM | POA: Insufficient documentation

## 2016-11-19 DIAGNOSIS — Z9889 Other specified postprocedural states: Secondary | ICD-10-CM | POA: Diagnosis not present

## 2016-11-19 DIAGNOSIS — F329 Major depressive disorder, single episode, unspecified: Secondary | ICD-10-CM | POA: Insufficient documentation

## 2016-11-19 DIAGNOSIS — G4733 Obstructive sleep apnea (adult) (pediatric): Secondary | ICD-10-CM

## 2016-11-19 DIAGNOSIS — R41841 Cognitive communication deficit: Secondary | ICD-10-CM

## 2016-11-19 DIAGNOSIS — I110 Hypertensive heart disease with heart failure: Secondary | ICD-10-CM | POA: Insufficient documentation

## 2016-11-19 DIAGNOSIS — I252 Old myocardial infarction: Secondary | ICD-10-CM | POA: Insufficient documentation

## 2016-11-19 DIAGNOSIS — Z8051 Family history of malignant neoplasm of kidney: Secondary | ICD-10-CM | POA: Diagnosis not present

## 2016-11-19 DIAGNOSIS — J449 Chronic obstructive pulmonary disease, unspecified: Secondary | ICD-10-CM | POA: Insufficient documentation

## 2016-11-19 DIAGNOSIS — Z79899 Other long term (current) drug therapy: Secondary | ICD-10-CM | POA: Insufficient documentation

## 2016-11-19 DIAGNOSIS — Z955 Presence of coronary angioplasty implant and graft: Secondary | ICD-10-CM | POA: Diagnosis not present

## 2016-11-19 DIAGNOSIS — Z7901 Long term (current) use of anticoagulants: Secondary | ICD-10-CM | POA: Insufficient documentation

## 2016-11-19 DIAGNOSIS — I950 Idiopathic hypotension: Secondary | ICD-10-CM

## 2016-11-19 DIAGNOSIS — Z87891 Personal history of nicotine dependence: Secondary | ICD-10-CM | POA: Insufficient documentation

## 2016-11-19 DIAGNOSIS — I5022 Chronic systolic (congestive) heart failure: Secondary | ICD-10-CM | POA: Diagnosis present

## 2016-11-19 MED ORDER — SACUBITRIL-VALSARTAN 24-26 MG PO TABS: 1.0000 | ORAL_TABLET | Freq: Two times a day (BID) | ORAL | 3 refills | Status: DC

## 2016-11-19 NOTE — Patient Instructions (Addendum)
Continue weighing daily and call for an overnight weight gain of > 2 pounds or a weekly weight gain of >5 pounds.  Stop losartan and begin entresto 24/26mg  twice daily.

## 2016-11-19 NOTE — Progress Notes (Signed)
Patient ID: Kevin Pearson, male    DOB: [birth year], 66 y.o.   MRN: [id number]  HPI  Kevin Pearson is a 66 y/o male with a history of GERD, anxiety, atrial fibrillation, depression, COPD (chronic O2), TBI, MI (multiple stents), hepatitis, hyperlipidemia, HTN, obstructive sleep apnea (without CPAP), remote tobacco/drug/alcohol use and chronic heart failure.   Reviewed last echo report on 02/14/16 which showed an EF of 35%.   Was in the ED 10/06/16 due to hemoptysis. Evaluated and discharged home. ED visit on 07/27/16 due to chest pain. Troponins were negative and Kevin Pearson was discharged home. Was in the ED 07/02/16 due to bladder spasms. Treated and discharged home. Admitted 06/24/16 due to urinary retention. Had urethral dilation done and was discharged the next day.   Kevin Pearson presents today for his follow-up visit with a chief complaint of moderate fatigue with little exertion. Kevin Pearson describes it as chronic in nature and has been present for the last few years. Does wax and wane in severity. Fatigue does improve slightly with rest. Has associated shortness of breath, pedal edema, weight gain and light-headedness along with this.  Past Medical History:  Diagnosis Date  . Acid reflux   . Anxiety   . Arthritis   . Atrial fibrillation (HCC)   . CHF (congestive heart failure) (HCC)   . Chronic orthostatic hypotension   . Clotting disorder (HCC)   . COPD (chronic obstructive pulmonary disease) (HCC)   . Depression   . Elevated PSA   . Heart attack (HCC)   . Heart disease   . Heart failure (HCC)   . Hepatitis   . High cholesterol   . Hypertension   . Sleep apnea   . TBI (traumatic brain injury) (HCC)   . Urinary retention    Past Surgical History:  Procedure Laterality Date  . BLADDER SURGERY    . cardiac stents    . CYSTOSCOPY WITH URETHRAL DILATATION N/A 06/25/2016   Procedure: CYSTOSCOPY WITH URETHRAL DILATATION;  Surgeon: Vanna Scotland, MD;  Location: [facility] ORS;  Service: Urology;  Laterality: N/A;  .  EP IMPLANTABLE DEVICE     St. Jude BiV-ICD  . GREEN LIGHT LASER TURP (TRANSURETHRAL RESECTION OF PROSTATE  2016   done in [facility]    Family History  Problem Relation Age of Onset  . Other Father        Cerebral hemorrhage  . Kidney cancer Neg Hx   . Kidney disease Neg Hx   . Prostate cancer Neg Hx    Social History  Substance Use Topics  . Smoking status: Former Smoker    Types: Cigarettes    Quit date: 08/2015  . Smokeless tobacco: Never Used  . Alcohol use Yes   Allergies  Allergen Reactions  . Other Anaphylaxis and Itching    Mango skin  . Codeine Nausea And Vomiting   Prior to Admission medications   Medication Sig Start Date End Date Taking? Authorizing Provider  acetaminophen (TYLENOL) 325 MG tablet Take 650 mg by mouth every 6 (six) hours as needed.   Yes Historical Provider, MD  albuterol (PROVENTIL HFA;VENTOLIN HFA) 108 (90 Base) MCG/ACT inhaler Inhale 2 puffs into the lungs every 4 (four) hours as needed for shortness of breath.   Yes Historical Provider, MD  budesonide (PULMICORT) 0.5 MG/2ML nebulizer solution Inhale 2 mLs into the lungs daily. 09/26/16  Yes Historical Provider, MD  carvedilol (COREG) 6.25 MG tablet Take 6.25 mg by mouth 2 (two) times daily.   Yes Historical  Provider, MD  clonazePAM (KLONOPIN) 1 MG tablet Take 0.5 mg by mouth 3 (three) times daily.    Yes Historical Provider, MD  clopidogrel (PLAVIX) 75 MG tablet Take 75 mg by mouth daily.   Yes Historical Provider, MD  divalproex (DEPAKOTE ER) 500 MG 24 hr tablet Take 500-750 mg by mouth 2 (two) times daily. Take 500 mg in the morning and 750 mg at night.   Yes Historical Provider, MD  furosemide (LASIX) 40 MG tablet Take 40 mg by mouth 2 (two) times daily.   Yes Historical Provider, MD  gabapentin (NEURONTIN) 300 MG capsule Take 300-600 mg by mouth 3 (three) times daily. Take 300 mg in the morning, 300 mg at 2 pm and 300 mg at night.   Yes Historical Provider, MD  hydrALAZINE (APRESOLINE) 25 MG tablet  Take 25 mg by mouth 3 (three) times daily.   Yes Historical Provider, MD  Hyoscyamine Sulfate SL (LEVSIN/SL) 0.125 MG SUBL Place 1 tablet under the tongue daily. 07/07/16  Yes Shannon A McGowan, PA-C  ipratropium-albuterol (DUONEB) 0.5-2.5 (3) MG/3ML SOLN Take 3 mLs by nebulization every 6 (six) hours as needed (wheezing/shortness of breath).   Yes Historical Provider, MD  losartan (COZAAR) 50 MG tablet Take 50 mg by mouth daily.   Yes Historical Provider, MD  Melatonin 1 MG TABS Take 6 tablets by mouth at bedtime.    Yes Historical Provider, MD  nitroGLYCERIN (NITROSTAT) 0.4 MG SL tablet Place 0.4 mg under the tongue every 5 (five) minutes as needed for chest pain.   Yes Historical Provider, MD  omeprazole (PRILOSEC) 40 MG capsule Take 40 mg by mouth daily.   Yes Historical Provider, MD  potassium chloride SA (K-DUR,KLOR-CON) 20 MEQ tablet Take 20 mEq by mouth 2 (two) times daily.   Yes Historical Provider, MD  rivaroxaban (XARELTO) 20 MG TABS tablet Take 20 mg by mouth at bedtime.   Yes Historical Provider, MD  rosuvastatin (CRESTOR) 20 MG tablet Take 20 mg by mouth daily.   Yes Historical Provider, MD  tamsulosin (FLOMAX) 0.4 MG CAPS capsule TAKE ONE CAPSULE BY MOUTH EVERY DAY AFTER SUPPER 07/24/16  Yes Vanna Scotland, MD  traMADol (ULTRAM) 50 MG tablet Take 50 mg by mouth every 6 (six) hours as needed for moderate pain or severe pain.    Yes Historical Provider, MD  traZODone (DESYREL) 100 MG tablet Take 100 mg by mouth at bedtime. 09/23/16  Yes Historical Provider, MD   Review of Systems  Constitutional: Positive for fatigue. Negative for appetite change.  HENT: Negative for congestion and postnasal drip.   Eyes: Negative.   Respiratory: Positive for shortness of breath. Negative for chest tightness.   Cardiovascular: Positive for leg swelling. Negative for chest pain and palpitations.  Gastrointestinal: Positive for abdominal distention. Negative for abdominal pain.  Endocrine: Negative.    Genitourinary: Negative.   Musculoskeletal: Positive for arthralgias (bilateral foot pain). Negative for back pain.  Skin: Negative.   Allergic/Immunologic: Negative.   Neurological: Positive for tremors, light-headedness (when changing positions too quickly) and numbness (bilateral feet). Negative for dizziness.       Memory issues   Hematological: Negative for adenopathy. Does not bruise/bleed easily.  Psychiatric/Behavioral: Negative for dysphoric mood and sleep disturbance (sleeping on 2 pillows; wearing oxygen at 2L around the clock). The patient is nervous/anxious.     Vitals:   11/19/16 1140  BP: 121/72  Pulse: 69  Resp: 20  SpO2: 99%  Weight: 276 lb (125.2 kg)  Height:  5' 6.93" (1.7 m)   Wt Readings from Last 3 Encounters:  11/19/16 276 lb (125.2 kg)  10/29/16 272 lb (123.4 kg)  10/26/16 275 lb (124.7 kg)   Lab Results  Component Value Date   CREATININE 1.26 (H) 10/06/2016   CREATININE 1.25 (H) 07/27/2016   CREATININE 1.30 (H) 07/02/2016   Physical Exam  Constitutional: Kevin Pearson is oriented to person, place, and time. Kevin Pearson appears well-developed and well-nourished.  HENT:  Head: Normocephalic and atraumatic.  Neck: Normal range of motion. Neck supple. No JVD present.  Cardiovascular: Normal rate and regular rhythm.   Pulmonary/Chest: Effort normal. Kevin Pearson has no wheezes. Kevin Pearson has no rales.  Abdominal: Soft. Kevin Pearson exhibits distension. There is no tenderness.  Musculoskeletal: Kevin Pearson exhibits edema (3+ pitting edema in bilateral lower legs). Kevin Pearson exhibits no tenderness.  Neurological: Kevin Pearson is alert and oriented to person, place, and time.  Skin: Skin is warm and dry.  Psychiatric: Kevin Pearson has a normal mood and affect. His behavior is normal.  Nursing note and vitals reviewed.  Assessment & Plan:  1: Chronic heart failure with reduced ejection fraction- - NYHA class II - mildly fluid overloaded  - has been taking furosemide 40mg  BID - has been weighing daily. Instructed to weigh every  morning, write the weight down and call for an overnight weight gain of >2 pounds or a weekly weight gain of >5 pounds. By our scale, Kevin Pearson's gained 4 pounds in the last month - not adding salt to his food. Reviewed a 2000mg  sodium diet  - has decreased his fluid intake down but says that Kevin Pearson's still drinking about 1/2 gallon of fluids daily. Is working on decreasing it some more.  - saw cardiologist (Paraschos) 10/26/16 and returns to him in 3 months - stop losartan and begin entresto 24/26mg  twice daily.  - check a BMP at next visit  2: Hypotension- - BP looks good today - saw PCP ([place]) 10/23/16  3: Obstructive sleep apnea- - wears oxygen at 2L around the clock - unable to tolerate wearing CPAP due to anxiety/claustrophobia  Medication list was reviewed.  Return in 1 month and will check a BMP at that time.

## 2016-11-20 ENCOUNTER — Encounter: Payer: Self-pay | Admitting: Speech Pathology

## 2016-11-20 NOTE — Therapy (Signed)
[place] MAIN [facility] SERVICES [address] [place], [place], [address] Phone: [phone]   Fax:  [phone]  Speech Language Pathology Treatment  Patient Details  Name: Kevin Pearson MRN: [id number] Date of Birth: Apr 10, 1951 Referring Provider: Dr. Jennings Books  Encounter Date: 11/19/2016      End of Session - 11/20/16 1112    Visit Number 10   Number of Visits 13   Date for SLP Re-Evaluation 12/01/16   SLP Start Time 1000   SLP Stop Time  1149   SLP Time Calculation (min) 109 min   Activity Tolerance Patient tolerated treatment well      Past Medical History:  Diagnosis Date  . Acid reflux   . Anxiety   . Arthritis   . Atrial fibrillation ([place])   . CHF (congestive heart failure) ([place])   . Chronic orthostatic hypotension   . Clotting disorder ([place])   . COPD (chronic obstructive pulmonary disease) ([place])   . Depression   . Elevated PSA   . Heart attack ([place])   . Heart disease   . Heart failure ([place])   . Hepatitis   . High cholesterol   . Hypertension   . Sleep apnea   . TBI (traumatic brain injury) ([place])   . Urinary retention     Past Surgical History:  Procedure Laterality Date  . BLADDER SURGERY    . cardiac stents    . CYSTOSCOPY WITH URETHRAL DILATATION N/A 06/25/2016   Procedure: CYSTOSCOPY WITH URETHRAL DILATATION;  Surgeon: Hollice Espy, MD;  Location: [facility] ORS;  Service: Urology;  Laterality: N/A;  . EP IMPLANTABLE DEVICE     St. Jude BiV-ICD  . GREEN LIGHT LASER TURP (TRANSURETHRAL RESECTION OF PROSTATE  2016   done in [place]     There were no vitals filed for this visit.      Subjective Assessment - 11/20/16 1111    Subjective The patient stated he felt organized today   Currently in Pain? No/denies               ADULT SLP TREATMENT - 11/20/16 0001      General Information   Behavior/Cognition Alert;Cooperative;Pleasant mood;Impulsive;Distractible;Decreased sustained attention   HPI Pt is a  66 y/o male with a h/o COPD, congestive heart failure, obstructive sleep apnea and kidney disease. Pt fell in 08/2015 and was not found for 30 hours. Pt had broken ribs and severe confusion after the fall which have improved. However pt continues to have difficulty with memory. Per MD noted pt has cognitive impairment d/t likely anoxic ischemia from fall plus a h/o TBI, long standing COPD, chronic pain with pain medications and benzodiazapine use and history of heavy alcohol use and polysubstance use.      Treatment Provided   Treatment provided Cognitive-Linquistic     Pain Assessment   Pain Assessment No/denies pain     Cognitive-Linquistic Treatment   Treatment focused on Cognition   Skilled Treatment COGNITIVE BARRIERS:  The patient had his oxygen in place, his storage clipboard, and his planner with notebook.  The patient has been using the planner and the notebook.  Overall, the patient is much less anxious / stressed and is more organized.  The patient was able to state what tasks he needs to accomplish over the next week and indicate a plan.  Today the patient identifies that he does not always remember to all of the projects he has going on, that he gets distracted by  the immediacy of a particular issue.  Patient participated in developing plan to use a space on his planner to keep a running list of projects and to use the weekly section to plan and prioritize each day.     Assessment / Recommendations / Plan   Plan Continue with current plan of care     Progression Toward Goals   Progression toward goals Progressing toward goals          SLP Education - Nov 30, 2016 1111    Education provided Yes   Education Details identifying barriers and problem solving potential compensatory strategies   Person(s) Educated Patient   Methods Explanation   Comprehension Verbalized understanding            SLP Long Term Goals - Nov 30, 2016 1112      SLP LONG TERM GOAL #1   Title Patient will  demonstrate functional use of external memory aids with 80% acc within structured settins.    Time 6   Period Weeks   Status Partially Met     SLP LONG TERM GOAL #2   Title Pt will demonstrate functional cognitive-communication skills for independent completion of personal responsibilities.    Time 6   Period Weeks   Status Partially Met     SLP LONG TERM GOAL #3   Title Pt wil complete executive function skills tasks with 80% acc.    Time 6   Period Weeks   Status Partially Met     SLP LONG TERM GOAL #4   Title Patient will complete memory strategy activities with 80% acc.    Time 6   Period Weeks   Status Partially Met          Plan - 2016/11/30 1112    Clinical Impression Statement The patient was organized, with portable oxygen in place and his planner/notebook together in his storage clipboard.  He was calm and able to participate in the session with less internal distraction.   Speech Therapy Frequency 2x / week   Duration Other (comment)   Treatment/Interventions Functional tasks;Cognitive reorganization;SLP instruction and feedback;Compensatory strategies;Patient/family education;Internal/external aids   Potential to Achieve Goals Good   Potential Considerations Family/community support;Co-morbidities   SLP Home Exercise Plan Written assignments given   Consulted and Agree with Plan of Care Patient      Patient will benefit from skilled therapeutic intervention in order to improve the following deficits and impairments:   Cognitive communication deficit      G-Codes - 2016/11/30 1113    Functional Assessment Tool Used Clinical judgement   Functional Limitations Memory   Memory Current Status (A1937) At least 1 percent but less than 20 percent impaired, limited or restricted   Memory Goal Status (T0240) At least 1 percent but less than 20 percent impaired, limited or restricted      Problem List Patient Active Problem List   Diagnosis Date Noted  .  Hypotension 10/29/2016  . Obstructive sleep apnea 10/29/2016  . Chronic bilateral low back pain 08/27/2016  . H/O hypotension 07/29/2016  . Chronic obstructive pulmonary disease ([place]) 07/16/2016  . Chronic systolic congestive heart failure ([place]) 07/16/2016  . Peripheral neuropathy 07/16/2016  . Anxiety and depression 07/16/2016  . Arthralgia of right foot 07/16/2016  . Urethral stricture 07/16/2016  . Urinary retention 06/24/2016   Leroy Sea, MS/CCC- SLP  Lou Miner Nov 30, 2016, 11:14 AM  [place] [address] [place], [place], [address] Phone: [phone]  Fax:  [phone]   Name: Kevin Pearson MRN: [id number] Date of Birth: 1950-07-09

## 2016-11-21 ENCOUNTER — Other Ambulatory Visit: Payer: Self-pay | Admitting: Urology

## 2016-11-21 DIAGNOSIS — N359 Urethral stricture, unspecified: Secondary | ICD-10-CM

## 2016-11-24 ENCOUNTER — Encounter: Payer: Self-pay | Admitting: Speech Pathology

## 2016-11-24 ENCOUNTER — Ambulatory Visit: Payer: BLUE CROSS/BLUE SHIELD | Admitting: Speech Pathology

## 2016-11-24 DIAGNOSIS — R41841 Cognitive communication deficit: Secondary | ICD-10-CM

## 2016-11-24 NOTE — Therapy (Signed)
[place] MAIN [facility] SERVICES [address] [place], [place], [address] Phone: [phone]   Fax:  [phone]  Speech Language Pathology Treatment/Discharge Summary  Patient Details  Name: Kevin Pearson MRN: [id number] Date of Birth: [birth year] Referring Provider: Dr. Jennings Books  Encounter Date: 11/24/2016      End of Session - 11/24/16 1317    Visit Number 11   Number of Visits 13   Date for SLP Re-Evaluation 12/01/16   SLP Start Time 1007   SLP Stop Time  1050   SLP Time Calculation (min) 43 min   Activity Tolerance Patient tolerated treatment well      Past Medical History:  Diagnosis Date  . Acid reflux   . Anxiety   . Arthritis   . Atrial fibrillation ([place])   . CHF (congestive heart failure) ([place])   . Chronic orthostatic hypotension   . Clotting disorder ([place])   . COPD (chronic obstructive pulmonary disease) ([place])   . Depression   . Elevated PSA   . Heart attack ([place])   . Heart disease   . Heart failure ([place])   . Hepatitis   . High cholesterol   . Hypertension   . Sleep apnea   . TBI (traumatic brain injury) ([place])   . Urinary retention     Past Surgical History:  Procedure Laterality Date  . BLADDER SURGERY    . cardiac stents    . CYSTOSCOPY WITH URETHRAL DILATATION N/A 06/25/2016   Procedure: CYSTOSCOPY WITH URETHRAL DILATATION;  Surgeon: Hollice Espy, MD;  Location: [facility] ORS;  Service: Urology;  Laterality: N/A;  . EP IMPLANTABLE DEVICE     St. Jude BiV-ICD  . GREEN LIGHT LASER TURP (TRANSURETHRAL RESECTION OF PROSTATE  2016   done in [place]     There were no vitals filed for this visit.      Subjective Assessment - 11/24/16 1317    Subjective The patient stated he felt organized today   Currently in Pain? No/denies               ADULT SLP TREATMENT - 11/24/16 0001      General Information   Behavior/Cognition Alert;Cooperative;Pleasant mood;Impulsive;Distractible;Decreased sustained  attention   HPI Pt is a 66 y/o male with a h/o COPD, congestive heart failure, obstructive sleep apnea and kidney disease. Pt fell in 08/2015 and was not found for 30 hours. Pt had broken ribs and severe confusion after the fall which have improved. However pt continues to have difficulty with memory. Per MD noted pt has cognitive impairment d/t likely anoxic ischemia from fall plus a h/o TBI, long standing COPD, chronic pain with pain medications and benzodiazapine use and history of heavy alcohol use and polysubstance use.      Treatment Provided   Treatment provided Cognitive-Linquistic     Pain Assessment   Pain Assessment No/denies pain     Cognitive-Linquistic Treatment   Treatment focused on Cognition   Skilled Treatment PATIENT EDUCATION:  The patient had his oxygen in place, his storage clipboard, his planner but his notebook was in his truck.  The patient has been using the planner and the notebook.  Overall, the patient is much less anxious / stressed and is more organized.  The patient was able to state what tasks he needs to accomplish over the next week and indicate a plan.  Today the patient identifies that he does not always remember to all of the projects he has going on,  that he gets distracted by the immediacy of a particular issue.  Patient participated in developing plan to use a space on his planner to keep a running list of projects and to use the weekly section to plan and prioritize each day.  He is advised to develop a section in his notebook for every problem and/or project.  He can then maintain notes on the progress of each project.     Assessment / Recommendations / Plan   Plan Continue with current plan of care     Progression Toward Goals   Progression toward goals Progressing toward goals          SLP Education - 11/24/16 1317    Education provided Yes   Education Details identify barriers and problem solve compensatory strategies   Person(s) Educated Patient    Methods Explanation   Comprehension Verbalized understanding            SLP Long Term Goals - 11/24/16 1323      SLP LONG TERM GOAL #1   Title Patient will demonstrate functional use of external memory aids with 80% acc within structured settins.    Time 6   Period Weeks   Status Partially Met     SLP LONG TERM GOAL #2   Title Pt will demonstrate functional cognitive-communication skills for independent completion of personal responsibilities.    Time 6   Period Weeks   Status Partially Met     SLP LONG TERM GOAL #3   Title Pt wil complete executive function skills tasks with 80% acc.    Time 6   Period Weeks   Status Partially Met     SLP LONG TERM GOAL #4   Title Patient will complete memory strategy activities with 80% acc.    Time 6   Period Weeks   Status Partially Met          Plan - 11/24/16 1318    Clinical Impression Statement The patient was organized, with portable oxygen in place and his planner/notebook together in his storage clipboard.  He was calm and able to participate in the session with less internal distraction.  While the patient states understanding of the organizational systems implemented, he is easily distracted by internal and external stimuli.  When under stress, the patient is less able to make prudent choices and decisions.  He is not fully independent to establish priorities and problem solve functional strategies.     Speech Therapy Frequency Other (comment)  Discharge   Treatment/Interventions Functional tasks;Cognitive reorganization;SLP instruction and feedback;Compensatory strategies;Patient/family education;Internal/external aids   Potential to Achieve Goals Good   Potential Considerations Family/community support;Co-morbidities   SLP Home Exercise Plan Written assignments given   Consulted and Agree with Plan of Care Patient      Patient will benefit from skilled therapeutic intervention in order to improve the following  deficits and impairments:   Cognitive communication deficit    Problem List Patient Active Problem List   Diagnosis Date Noted  . Hypotension 10/29/2016  . Obstructive sleep apnea 10/29/2016  . Chronic bilateral low back pain 08/27/2016  . H/O hypotension 07/29/2016  . Chronic obstructive pulmonary disease ([place]) 07/16/2016  . Chronic systolic congestive heart failure ([place]) 07/16/2016  . Peripheral neuropathy 07/16/2016  . Anxiety and depression 07/16/2016  . Arthralgia of right foot 07/16/2016  . Urethral stricture 07/16/2016  . Urinary retention 06/24/2016   Leroy Sea, MS/CCC- SLP  Lou Miner 11/24/2016, 1:26 PM  [place]  [place] MAIN [facility] SERVICES [place], [place], [address] Phone: [phone]   Fax:  [phone]   Name: Apolonio Cutting MRN: [id number] Date of Birth: [birth year]

## 2016-11-25 ENCOUNTER — Telehealth: Payer: Self-pay

## 2016-11-25 NOTE — Telephone Encounter (Signed)
Kevin Pearson daughter contacted the office to verify medication changes made at Kevin Pearson last visit.   I went over Kevin Pearson medication and advised that he needs to stop his losartan when beginning Entresto. She states that he took his losartan this morning so I advised that he not start the Entresto until 5/24, as the medications should not be taken together.   She also voice concern that her father is not compliant with his fluid intake. She states that he is very swollen. He did have 3+ pitting edema on his last office visit on 11/19/2016. She states that she has been giving him 40 mg of lasix twice daily and the past 2 days has given him an additional 20 mg. I did remind her that due to his kidney function his diuretic should not be increased without guidance from a provider his last Creatinine level drawn on 10/06/2016 showed elevation at 1.26. She is unsure if there has been any change in weight as patient is not weighing daily. I inquired if patient had a scale at home and she states that he does but it is still in the box.   I did inform her that weighing daily is very important and is the first sign that the patient is retaining fluid and that it is very important for Mr. Kevin Pearson to get better control on his fluid intake. Per Mr. Kevin Pearson he is drinking about 1/2 gallon a day, but his daughter states its more than that. Per doctor Paraschos he needs to drink no more than 32 oz daily.   I spoke with Kevin Kindredina Hackney FNP and she advised that he needs to be given an additional potassium tablet when taking an additional lasix. She would also like to see him in clinic next week to recheck his labs. Appointment made with his daughter for 12/01/2016 at 11:40 AM.

## 2016-11-26 ENCOUNTER — Encounter: Payer: BLUE CROSS/BLUE SHIELD | Admitting: Speech Pathology

## 2016-11-27 ENCOUNTER — Ambulatory Visit: Payer: BLUE CROSS/BLUE SHIELD | Admitting: Urology

## 2016-12-01 ENCOUNTER — Ambulatory Visit: Payer: BLUE CROSS/BLUE SHIELD | Attending: Family | Admitting: Family

## 2016-12-01 ENCOUNTER — Encounter: Payer: Self-pay | Admitting: Family

## 2016-12-01 VITALS — BP 127/67 | HR 67 | Resp 20 | Ht 66.93 in | Wt 272.0 lb

## 2016-12-01 DIAGNOSIS — Z9981 Dependence on supplemental oxygen: Secondary | ICD-10-CM | POA: Diagnosis not present

## 2016-12-01 DIAGNOSIS — K219 Gastro-esophageal reflux disease without esophagitis: Secondary | ICD-10-CM | POA: Insufficient documentation

## 2016-12-01 DIAGNOSIS — F329 Major depressive disorder, single episode, unspecified: Secondary | ICD-10-CM | POA: Diagnosis not present

## 2016-12-01 DIAGNOSIS — Z7901 Long term (current) use of anticoagulants: Secondary | ICD-10-CM | POA: Insufficient documentation

## 2016-12-01 DIAGNOSIS — E7800 Pure hypercholesterolemia, unspecified: Secondary | ICD-10-CM | POA: Diagnosis not present

## 2016-12-01 DIAGNOSIS — F419 Anxiety disorder, unspecified: Secondary | ICD-10-CM | POA: Diagnosis not present

## 2016-12-01 DIAGNOSIS — I950 Idiopathic hypotension: Secondary | ICD-10-CM

## 2016-12-01 DIAGNOSIS — I110 Hypertensive heart disease with heart failure: Secondary | ICD-10-CM | POA: Diagnosis present

## 2016-12-01 DIAGNOSIS — I4891 Unspecified atrial fibrillation: Secondary | ICD-10-CM | POA: Diagnosis present

## 2016-12-01 DIAGNOSIS — I959 Hypotension, unspecified: Secondary | ICD-10-CM | POA: Diagnosis not present

## 2016-12-01 DIAGNOSIS — J449 Chronic obstructive pulmonary disease, unspecified: Secondary | ICD-10-CM | POA: Diagnosis not present

## 2016-12-01 DIAGNOSIS — I252 Old myocardial infarction: Secondary | ICD-10-CM | POA: Insufficient documentation

## 2016-12-01 DIAGNOSIS — G4733 Obstructive sleep apnea (adult) (pediatric): Secondary | ICD-10-CM | POA: Diagnosis not present

## 2016-12-01 DIAGNOSIS — I5022 Chronic systolic (congestive) heart failure: Secondary | ICD-10-CM

## 2016-12-01 DIAGNOSIS — F32A Depression, unspecified: Secondary | ICD-10-CM

## 2016-12-01 LAB — BASIC METABOLIC PANEL
Anion gap: 6 (ref 5–15)
BUN: 28 mg/dL — ABNORMAL HIGH (ref 6–20)
CO2: 24 mmol/L (ref 22–32)
Calcium: 9.9 mg/dL (ref 8.9–10.3)
Chloride: 107 mmol/L (ref 101–111)
Creatinine, Ser: 1.33 mg/dL — ABNORMAL HIGH (ref 0.61–1.24)
GFR calc Af Amer: >60 mL/min (ref >60)
GFR calc non Af Amer: 55 mL/min — ABNORMAL LOW (ref >60)
Glucose, Bld: 99 mg/dL (ref 65–99)
Potassium: 5.2 mmol/L — ABNORMAL HIGH (ref 3.5–5.1)
Sodium: 137 mmol/L (ref 135–145)

## 2016-12-01 NOTE — Progress Notes (Signed)
Patient ID: Kevin Pearson, male    DOB: Mar 04, 1951, 66 y.o.   MRN: [id number]  HPI  Kevin Pearson is a 66 y/o male with a history of GERD, anxiety, atrial fibrillation, depression, COPD (chronic O2), TBI, MI (multiple stents), hepatitis, hyperlipidemia, HTN, obstructive sleep apnea (without CPAP), remote tobacco/drug/alcohol use and chronic heart failure.   Reviewed last echo report on 02/14/16 which showed an EF of 35%.   Was in the ED 10/06/16 due to hemoptysis. Evaluated and discharged home. ED visit on 07/27/16 due to chest pain. Troponins were negative and he was discharged home. Was in the ED 07/02/16 due to bladder spasms. Treated and discharged home. Admitted 06/24/16 due to urinary retention. Had urethral dilation done and was discharged the next day.   He presents today for his follow-up visit with a chief complaint of moderate fatigue with little exertion. He describes it as chronic in nature and has been present for the last few years. Does wax and wane in severity. Fatigue does improve slightly with rest. Has associated shortness of breath, pedal edema, and light-headedness along with this.  Past Medical History:  Diagnosis Date  . Acid reflux   . Anxiety   . Arthritis   . Atrial fibrillation (HCC)   . CHF (congestive heart failure) (HCC)   . Chronic orthostatic hypotension   . Clotting disorder (HCC)   . COPD (chronic obstructive pulmonary disease) (HCC)   . Depression   . Elevated PSA   . Heart attack (HCC)   . Heart disease   . Heart failure (HCC)   . Hepatitis   . High cholesterol   . Hypertension   . Sleep apnea   . TBI (traumatic brain injury) (HCC)   . Urinary retention    Past Surgical History:  Procedure Laterality Date  . BLADDER SURGERY    . cardiac stents    . CYSTOSCOPY WITH URETHRAL DILATATION N/A 06/25/2016   Procedure: CYSTOSCOPY WITH URETHRAL DILATATION;  Surgeon: Kevin Scotland, MD;  Location: [facility] ORS;  Service: Urology;  Laterality: N/A;  . EP  IMPLANTABLE DEVICE     St. Jude BiV-ICD  . GREEN LIGHT LASER TURP (TRANSURETHRAL RESECTION OF PROSTATE  2016   done in [facility]    Family History  Problem Relation Age of Onset  . Other Father        Cerebral hemorrhage  . Kidney cancer Neg Hx   . Kidney disease Neg Hx   . Prostate cancer Neg Hx    Social History  Substance Use Topics  . Smoking status: Former Smoker    Types: Cigarettes    Quit date: 08/2015  . Smokeless tobacco: Never Used  . Alcohol use Yes   Allergies  Allergen Reactions  . Other Anaphylaxis and Itching    Mango skin  . Codeine Nausea And Vomiting   Prior to Admission medications   Medication Sig Start Date End Date Taking? Authorizing Provider  acetaminophen (TYLENOL) 325 MG tablet Take 650 mg by mouth every 6 (six) hours as needed.   Yes [provider]  albuterol (PROVENTIL HFA;VENTOLIN HFA) 108 (90 Base) MCG/ACT inhaler Inhale 2 puffs into the lungs every 4 (four) hours as needed for shortness of breath.   Yes [provider]  budesonide (PULMICORT) 0.5 MG/2ML nebulizer solution Inhale 2 mLs into the lungs daily. 09/26/16  Yes [provider]  carvedilol (COREG) 6.25 MG tablet Take 6.25 mg by mouth 2 (two) times daily.   Yes [provider]  clonazePAM (KLONOPIN) 1 MG tablet Take 0.5 mg by mouth 3 (three) times daily.    Yes [provider]  clopidogrel (PLAVIX) 75 MG tablet Take 75 mg by mouth daily.   Yes [provider]  divalproex (DEPAKOTE ER) 500 MG 24 hr tablet Take 500-750 mg by mouth 2 (two) times daily. Take 500 mg in the morning and 750 mg at night.   Yes [provider]  furosemide (LASIX) 40 MG tablet Take 40 mg by mouth 2 (two) times daily.   Yes [provider]  gabapentin (NEURONTIN) 300 MG capsule Take 600 mg by mouth 3 (three) times daily. Take 300 mg in the morning, 300 mg at 2 pm and 300 mg at night.   Yes [provider]  hydrALAZINE (APRESOLINE) 25 MG tablet  Take 25 mg by mouth 3 (three) times daily.   Yes [provider]  Hyoscyamine Sulfate SL (LEVSIN/SL) 0.125 MG SUBL Place 1 tablet under the tongue daily. 07/07/16  Yes McGowan, Carollee Herter A, PA-C  ipratropium-albuterol (DUONEB) 0.5-2.5 (3) MG/3ML SOLN Take 3 mLs by nebulization every 6 (six) hours as needed (wheezing/shortness of breath).   Yes [provider]  Melatonin 1 MG TABS Take 6 tablets by mouth at bedtime.    Yes [provider]  nitroGLYCERIN (NITROSTAT) 0.4 MG SL tablet Place 0.4 mg under the tongue every 5 (five) minutes as needed for chest pain.   Yes [provider]  omeprazole (PRILOSEC) 40 MG capsule Take 40 mg by mouth daily.   Yes [provider]  potassium chloride SA (K-DUR,KLOR-CON) 20 MEQ tablet Take 20 mEq by mouth 2 (two) times daily.   Yes [provider]  rivaroxaban (XARELTO) 20 MG TABS tablet Take 20 mg by mouth at bedtime.   Yes [provider]  rosuvastatin (CRESTOR) 20 MG tablet Take 20 mg by mouth daily.   Yes [provider]  sacubitril-valsartan (ENTRESTO) 24-26 MG Take 1 tablet by mouth 2 (two) times daily. 11/19/16  Yes [name] A, FNP  tamsulosin (FLOMAX) 0.4 MG CAPS capsule TAKE ONE CAPSULE BY MOUTH EVERY DAY AFTER SUPPER 11/23/16  Yes Kevin Scotland, MD  traMADol (ULTRAM) 50 MG tablet Take 50 mg by mouth every 6 (six) hours as needed for moderate pain or severe pain.    Yes [provider]  traZODone (DESYREL) 100 MG tablet Take 100 mg by mouth at bedtime. 09/23/16  Yes [provider]    Review of Systems  Constitutional: Positive for fatigue. Negative for appetite change.  HENT: Negative for congestion and postnasal drip.   Eyes: Negative.   Respiratory: Positive for shortness of breath. Negative for chest tightness.   Cardiovascular: Positive for leg swelling. Negative for chest pain and palpitations.  Gastrointestinal: Positive for abdominal distention. Negative for  abdominal pain.  Endocrine: Negative.   Genitourinary: Negative.   Musculoskeletal: Positive for arthralgias (bilateral foot pain). Negative for back pain.  Skin: Negative.   Allergic/Immunologic: Negative.   Neurological: Positive for tremors, light-headedness (when changing positions too quickly) and numbness (bilateral feet). Negative for dizziness.       Memory issues   Hematological: Negative for adenopathy. Does not bruise/bleed easily.  Psychiatric/Behavioral: Negative for dysphoric mood and sleep disturbance (sleeping on 2 pillows; wearing oxygen at 2L around the clock). The patient is nervous/anxious.    Vitals:   12/01/16 1144  BP: 127/67  Pulse: 67  Resp: 20  SpO2: 100%  Weight: 272 lb (123.4 kg)  Height:  5' 6.93" (1.7 m)   Wt Readings from Last 3 Encounters:  12/01/16 272 lb (123.4 kg)  11/19/16 276 lb (125.2 kg)  10/29/16 272 lb (123.4 kg)     Lab Results  Component Value Date   CREATININE 1.26 (H) 10/06/2016   CREATININE 1.25 (H) 07/27/2016   CREATININE 1.30 (H) 07/02/2016   Physical Exam  Constitutional: He is oriented to person, place, and time. He appears well-developed and well-nourished.  HENT:  Head: Normocephalic and atraumatic.  Neck: Normal range of motion. Neck supple. No JVD present.  Cardiovascular: Normal rate and regular rhythm.   Pulmonary/Chest: Effort normal. He has no wheezes. He has no rales.  Abdominal: Soft. He exhibits distension. There is no tenderness.  Musculoskeletal: He exhibits edema (3+ pitting edema in bilateral lower legs). He exhibits no tenderness.  Neurological: He is alert and oriented to person, place, and time.  Skin: Skin is warm and dry.  Psychiatric: He has a normal mood and affect. His behavior is normal.  Nursing note and vitals reviewed.  Assessment & Plan:  1: Chronic heart failure with reduced ejection fraction- - NYHA class II - mildly fluid overloaded  - has been taking furosemide 40mg  BID along with an  additional 20mg  daily for a few days due to increased edema - will check a BMP today due to increased diuretic usage - has not been weighing daily. He says that he's recently moved and thinks his scale is at his daughter's house. Instructed to weigh every morning, write the weight down and call for an overnight weight gain of >2 pounds or a weekly weight gain of >5 pounds. By our scale, he's lost 4 pounds since 11/19/16 - not adding salt to his food. Reviewed a 2000mg  sodium diet  - continues to drink excessive fluids and says that he's probably drinking at least 48 ounces of fluid daily. Understands the importance of decreasing consumption and says that he'll work on it - saw cardiologist (Paraschos) 10/26/16 and returns to him in 3 months - began entresto 24/26mg  twice daily on 11/26/16  2: Hypotension- - BP looks good today - saw PCP ([place]) 10/23/16  3: Obstructive sleep apnea- - wears oxygen at 2L around the clock - unable to tolerate wearing CPAP due to anxiety/claustrophobia  4: Anxiety/depression- - recently moved into his own apartment and after he moved the air conditioner broke and the temperature inside the apartment got up into the upper 80's - is supposed to be getting it fixed today - having difficulties with his daughter & son and is working on distancing himself from them - emotional support offered - asked who we should call as an emergency contact since his daughter is currently listed and he says that for now, she needs to remain a contact as he's not doing his own medications yet  Patient did not bring his medications nor a list. Each medication was verbally reviewed with the patient and he was encouraged to bring the bottles to every visit to confirm accuracy of list.  Return in 6 weeks or sooner for any questions/problems before then.

## 2016-12-01 NOTE — Patient Instructions (Signed)
Continue weighing daily and call for an overnight weight gain of > 2 pounds or a weekly weight gain of >5 pounds. 

## 2016-12-02 ENCOUNTER — Other Ambulatory Visit: Payer: Self-pay | Admitting: Family

## 2016-12-02 ENCOUNTER — Telehealth: Payer: Self-pay | Admitting: Family

## 2016-12-02 NOTE — Telephone Encounter (Signed)
LM on patient's voicemail about lab work but also called his daughter who takes care of his medications. She says that he doesn't take the potassium tablet consistently because he says that it's too big for him to swallow. Advised his daughter to take the potassium out of his pillbox completely then and we will recheck it when he comes in next time.   Relayed this information on patient's voicemail as well.

## 2016-12-07 ENCOUNTER — Encounter: Payer: Self-pay | Admitting: Physical Therapy

## 2016-12-07 ENCOUNTER — Ambulatory Visit: Payer: BLUE CROSS/BLUE SHIELD | Attending: Family Medicine | Admitting: Physical Therapy

## 2016-12-07 ENCOUNTER — Telehealth: Payer: Self-pay

## 2016-12-07 DIAGNOSIS — M25512 Pain in left shoulder: Secondary | ICD-10-CM | POA: Diagnosis present

## 2016-12-07 DIAGNOSIS — M25511 Pain in right shoulder: Secondary | ICD-10-CM | POA: Diagnosis present

## 2016-12-07 DIAGNOSIS — M6281 Muscle weakness (generalized): Secondary | ICD-10-CM

## 2016-12-07 DIAGNOSIS — G8929 Other chronic pain: Secondary | ICD-10-CM

## 2016-12-07 DIAGNOSIS — M25611 Stiffness of right shoulder, not elsewhere classified: Secondary | ICD-10-CM

## 2016-12-07 DIAGNOSIS — M25612 Stiffness of left shoulder, not elsewhere classified: Secondary | ICD-10-CM | POA: Insufficient documentation

## 2016-12-07 DIAGNOSIS — R293 Abnormal posture: Secondary | ICD-10-CM

## 2016-12-07 DIAGNOSIS — R41841 Cognitive communication deficit: Secondary | ICD-10-CM | POA: Diagnosis present

## 2016-12-07 NOTE — Telephone Encounter (Signed)
Mr. Kevin Pearson's daughter called the office stating that he father was experiencing increased fatigue. She would like to know if she should decrease his Entresto. She states that her dad gets up at 8 am and then goes back to sleep at 10 am until 2 pm and that is not like him.   I spoke with Kevin Pearson in regards to making changes to his medications and she does not feel that [facility] fatigue. She does not want to make any changes to the medication at this time and advised that if the daughter is still concerned a bout his sleep she should contact his primary care physician.  Mr. Kevin SignsGuttos daughter verbalized understanding, and will contact his PCP if the fatigue persists.
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
## 2016-12-10 ENCOUNTER — Ambulatory Visit: Payer: BLUE CROSS/BLUE SHIELD | Admitting: Physical Therapy

## 2016-12-10 ENCOUNTER — Telehealth: Payer: Self-pay | Admitting: Radiology

## 2016-12-10 DIAGNOSIS — M25611 Stiffness of right shoulder, not elsewhere classified: Secondary | ICD-10-CM

## 2016-12-10 DIAGNOSIS — M25511 Pain in right shoulder: Principal | ICD-10-CM

## 2016-12-10 DIAGNOSIS — R293 Abnormal posture: Secondary | ICD-10-CM

## 2016-12-10 DIAGNOSIS — M6281 Muscle weakness (generalized): Secondary | ICD-10-CM

## 2016-12-10 DIAGNOSIS — M25612 Stiffness of left shoulder, not elsewhere classified: Secondary | ICD-10-CM

## 2016-12-10 DIAGNOSIS — G8929 Other chronic pain: Secondary | ICD-10-CM

## 2016-12-10 DIAGNOSIS — M25512 Pain in left shoulder: Principal | ICD-10-CM

## 2016-12-10 NOTE — Telephone Encounter (Signed)
Appt made
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
## 2016-12-10 NOTE — Telephone Encounter (Signed)
Catheterizing does not cause incontinence. I wonder if he may have a urinary tract infection which is irritating the lining of his bladder?  Please have him come in and provide a sample for us for UA/urine culture to rule this out.    Vanna ScotlandAshley Thang Flett, MD

## 2016-12-10 NOTE — Telephone Encounter (Signed)
Daughter, Charlynne PanderCara, states pt has been self cathing without problem but for past 3 days is having incontinence. Daughter states pt takes Lasix 40mg  bid as well as flomax.  Also started Entresto last week for CHF. Daughter asks if he is cathing too frequently, causing incontinence, or could this be caused by something else. Please advise.  Daughter may be reached at [phone].

## 2016-12-11 ENCOUNTER — Telehealth: Payer: Self-pay

## 2016-12-11 ENCOUNTER — Ambulatory Visit: Payer: BLUE CROSS/BLUE SHIELD | Admitting: Urology

## 2016-12-11 ENCOUNTER — Other Ambulatory Visit
Admission: RE | Admit: 2016-12-11 | Discharge: 2016-12-11 | Disposition: A | Discharge status: Home / Self Care | Payer: BLUE CROSS/BLUE SHIELD | Source: Ambulatory Visit | Attending: Urology | Admitting: Urology

## 2016-12-11 ENCOUNTER — Ambulatory Visit (INDEPENDENT_AMBULATORY_CARE_PROVIDER_SITE_OTHER): Payer: BLUE CROSS/BLUE SHIELD | Admitting: Urology

## 2016-12-11 DIAGNOSIS — R32 Unspecified urinary incontinence: Secondary | ICD-10-CM | POA: Diagnosis not present

## 2016-12-11 LAB — URINALYSIS, COMPLETE (UACMP) WITH MICROSCOPIC
Bilirubin Urine: NEGATIVE
Glucose, UA: NEGATIVE mg/dL
Hgb urine dipstick: NEGATIVE
Leukocytes, UA: NEGATIVE
Nitrite: NEGATIVE
Protein, ur: NEGATIVE mg/dL
Specific Gravity, Urine: 1.02 (ref 1.005–1.030)
pH: 5.5 (ref 5.0–8.0)

## 2016-12-11 LAB — BLADDER SCAN AMB NON-IMAGING

## 2016-12-11 NOTE — Telephone Encounter (Signed)
Patient notified

## 2016-12-11 NOTE — Telephone Encounter (Signed)
-----   Message from Vanna ScotlandAshley Brandon, MD sent at 12/11/2016  3:27 PM EDT ----- UA completely negative.  No sign of infection.  Please have him schedule follow up with me in the next few weeks to discuss urinary frequency/ urgency if symptoms fail to improve.  Vanna ScotlandAshley Brandon, MD

## 2016-12-11 NOTE — Progress Notes (Signed)
Patient presented today for nurse visit for bladder scan, UA and urine culture.    He's had increased urinary frequency, and incontinence. He is recently started a new blood pressure medication in addition to his Lasix.  UA today is unremarkable, no evidence of infection.  Results for orders placed or performed in visit on 12/11/16  BLADDER SCAN AMB NON-IMAGING  Result Value Ref Range   Scan Result 58ml

## 2016-12-13 LAB — URINE CULTURE: Culture: <10000 — AB

## 2016-12-14 ENCOUNTER — Emergency Department: Payer: BLUE CROSS/BLUE SHIELD

## 2016-12-14 ENCOUNTER — Emergency Department
Admission: EM | Admit: 2016-12-14 | Discharge: 2016-12-14 | Disposition: A | Discharge status: Home / Self Care | Payer: BLUE CROSS/BLUE SHIELD | Attending: Student in an Organized Health Care Education/Training Program | Admitting: Student in an Organized Health Care Education/Training Program

## 2016-12-14 ENCOUNTER — Encounter: Payer: BLUE CROSS/BLUE SHIELD | Admitting: Physical Therapy

## 2016-12-14 ENCOUNTER — Encounter: Payer: Self-pay | Admitting: Emergency Medicine

## 2016-12-14 DIAGNOSIS — Y929 Unspecified place or not applicable: Secondary | ICD-10-CM | POA: Diagnosis not present

## 2016-12-14 DIAGNOSIS — Y939 Activity, unspecified: Secondary | ICD-10-CM | POA: Insufficient documentation

## 2016-12-14 DIAGNOSIS — Y999 Unspecified external cause status: Secondary | ICD-10-CM | POA: Insufficient documentation

## 2016-12-14 DIAGNOSIS — Z7902 Long term (current) use of antithrombotics/antiplatelets: Secondary | ICD-10-CM | POA: Diagnosis not present

## 2016-12-14 DIAGNOSIS — S39012A Strain of muscle, fascia and tendon of lower back, initial encounter: Secondary | ICD-10-CM | POA: Insufficient documentation

## 2016-12-14 DIAGNOSIS — S0990XA Unspecified injury of head, initial encounter: Secondary | ICD-10-CM | POA: Insufficient documentation

## 2016-12-14 DIAGNOSIS — J449 Chronic obstructive pulmonary disease, unspecified: Secondary | ICD-10-CM | POA: Diagnosis not present

## 2016-12-14 DIAGNOSIS — Z87891 Personal history of nicotine dependence: Secondary | ICD-10-CM | POA: Insufficient documentation

## 2016-12-14 DIAGNOSIS — I252 Old myocardial infarction: Secondary | ICD-10-CM | POA: Diagnosis not present

## 2016-12-14 DIAGNOSIS — S3992XA Unspecified injury of lower back, initial encounter: Secondary | ICD-10-CM | POA: Diagnosis present

## 2016-12-14 DIAGNOSIS — M62830 Muscle spasm of back: Secondary | ICD-10-CM

## 2016-12-14 DIAGNOSIS — W108XXA Fall (on) (from) other stairs and steps, initial encounter: Secondary | ICD-10-CM | POA: Insufficient documentation

## 2016-12-14 DIAGNOSIS — I110 Hypertensive heart disease with heart failure: Secondary | ICD-10-CM | POA: Diagnosis not present

## 2016-12-14 DIAGNOSIS — I509 Heart failure, unspecified: Secondary | ICD-10-CM | POA: Insufficient documentation

## 2016-12-14 DIAGNOSIS — W19XXXA Unspecified fall, initial encounter: Secondary | ICD-10-CM

## 2016-12-14 MED ORDER — TRAMADOL HCL 50 MG PO TABS: 50.0000 mg | ORAL_TABLET | Freq: Once | ORAL | Status: DC

## 2016-12-14 MED ORDER — CYCLOBENZAPRINE HCL 5 MG PO TABS: 5.0000 mg | ORAL_TABLET | Freq: Three times a day (TID) | ORAL | 0 refills | Status: DC | PRN

## 2016-12-14 MED ORDER — CYCLOBENZAPRINE HCL 10 MG PO TABS: 5.0000 mg | ORAL_TABLET | Freq: Once | ORAL | Status: DC

## 2016-12-14 NOTE — ED Triage Notes (Signed)
States was one foot up stepladder and fell last night. No LOC. Mechanical fall. Abrasions and back pain.

## 2016-12-14 NOTE — ED Provider Notes (Signed)
[facility] Emergency Department Provider Note   ____________________________________________   I have reviewed the triage vital signs and the nursing notes.   HISTORY  Chief Complaint Back Pain    HPI Quillan Whitter is a 66 y.o. male presents with lumbar back pain after falling off of a step stool last night denies loss of consciousness, and was ambulatory following the fall. Patient describes falling to his left and landing on his left shoulder and the left part of his back. Patient is unsure if he lost consciousness but was ambulatory following the fall. Patient denies any episodes of nausea or vomiting following the injury. Denied radicular symptoms in the upper or lower extremities, saddle anesthesia or bowel/bladder incontinence. Patient has remote head injury. Patient denies fever, chills, headache, vision changes, chest pain, chest tightness, shortness of breath, abdominal pain, nausea and vomiting.  Past Medical History:  Diagnosis Date  . Acid reflux   . Anxiety   . Arthritis   . Atrial fibrillation (HCC)   . CHF (congestive heart failure) (HCC)   . Chronic orthostatic hypotension   . Clotting disorder (HCC)   . COPD (chronic obstructive pulmonary disease) (HCC)   . Depression   . Elevated PSA   . Heart attack (HCC)   . Heart disease   . Heart failure (HCC)   . Hepatitis   . High cholesterol   . Hypertension   . Sleep apnea   . TBI (traumatic brain injury) (HCC)   . Urinary retention     Patient Active Problem List   Diagnosis Date Noted  . Hypotension 10/29/2016  . Obstructive sleep apnea 10/29/2016  . Chronic bilateral low back pain 08/27/2016  . H/O hypotension 07/29/2016  . Chronic obstructive pulmonary disease (HCC) 07/16/2016  . Chronic systolic congestive heart failure (HCC) 07/16/2016  . Peripheral neuropathy 07/16/2016  . Anxiety and depression 07/16/2016  . Arthralgia of right foot 07/16/2016  . Urethral stricture  07/16/2016  . Urinary retention 06/24/2016    Past Surgical History:  Procedure Laterality Date  . BLADDER SURGERY    . cardiac stents    . CYSTOSCOPY WITH URETHRAL DILATATION N/A 06/25/2016   Procedure: CYSTOSCOPY WITH URETHRAL DILATATION;  Surgeon: Vanna Scotland, MD;  Location: [facility] ORS;  Service: Urology;  Laterality: N/A;  . EP IMPLANTABLE DEVICE     St. Jude BiV-ICD  . GREEN LIGHT LASER TURP (TRANSURETHRAL RESECTION OF PROSTATE  2016   done in [place]     Prior to Admission medications   Medication Sig Start Date End Date Taking? Authorizing Provider  acetaminophen (TYLENOL) 325 MG tablet Take 650 mg by mouth every 6 (six) hours as needed.    [provider]  albuterol (PROVENTIL HFA;VENTOLIN HFA) 108 (90 Base) MCG/ACT inhaler Inhale 2 puffs into the lungs every 4 (four) hours as needed for shortness of breath.    [provider]  budesonide (PULMICORT) 0.5 MG/2ML nebulizer solution Inhale 2 mLs into the lungs daily. 09/26/16   [provider]  carvedilol (COREG) 6.25 MG tablet Take 6.25 mg by mouth 2 (two) times daily.    [provider]  clonazePAM (KLONOPIN) 1 MG tablet Take 0.5 mg by mouth 3 (three) times daily.     [provider]  clopidogrel (PLAVIX) 75 MG tablet Take 75 mg by mouth daily.    [provider]  cyclobenzaprine (FLEXERIL) 5 MG tablet Take 1 tablet (5 mg total) by mouth 3 (three) times daily as needed. 12/14/16  Aarin Bluett M, PA-C  divalproex (DEPAKOTE ER) 500 MG 24 hr tablet Take 500-750 mg by mouth 2 (two) times daily. Take 500 mg in the morning and 750 mg at night.    [provider]  furosemide (LASIX) 40 MG tablet Take 40 mg by mouth 2 (two) times daily.    [provider]  gabapentin (NEURONTIN) 300 MG capsule Take 600 mg by mouth 3 (three) times daily. Take 300 mg in the morning, 300 mg at 2 pm and 300 mg at night.    [provider]  hydrALAZINE (APRESOLINE) 25 MG tablet Take 25  mg by mouth 3 (three) times daily.    [provider]  Hyoscyamine Sulfate SL (LEVSIN/SL) 0.125 MG SUBL Place 1 tablet under the tongue daily. 07/07/16   McGowan, Carollee Herter A, PA-C  ipratropium-albuterol (DUONEB) 0.5-2.5 (3) MG/3ML SOLN Take 3 mLs by nebulization every 6 (six) hours as needed (wheezing/shortness of breath).    [provider]  Melatonin 1 MG TABS Take 6 tablets by mouth at bedtime.     [provider]  nitroGLYCERIN (NITROSTAT) 0.4 MG SL tablet Place 0.4 mg under the tongue every 5 (five) minutes as needed for chest pain.    [provider]  omeprazole (PRILOSEC) 40 MG capsule Take 40 mg by mouth daily.    [provider]  rivaroxaban (XARELTO) 20 MG TABS tablet Take 20 mg by mouth at bedtime.    [provider]  rosuvastatin (CRESTOR) 20 MG tablet Take 20 mg by mouth daily.    [provider]  sacubitril-valsartan (ENTRESTO) 24-26 MG Take 1 tablet by mouth 2 (two) times daily. 11/19/16   Delma Freeze, FNP  tamsulosin (FLOMAX) 0.4 MG CAPS capsule TAKE ONE CAPSULE BY MOUTH EVERY DAY AFTER SUPPER 11/23/16   Vanna Scotland, MD  traMADol (ULTRAM) 50 MG tablet Take 50 mg by mouth every 6 (six) hours as needed for moderate pain or severe pain.     [provider]  traZODone (DESYREL) 100 MG tablet Take 100 mg by mouth at bedtime. 09/23/16   [provider]    Allergies Other and Codeine  Family History  Problem Relation Age of Onset  . Other Father        Cerebral hemorrhage  . Kidney cancer Neg Hx   . Kidney disease Neg Hx   . Prostate cancer Neg Hx     Social History Social History  Substance Use Topics  . Smoking status: Former Smoker    Types: Cigarettes    Quit date: 08/2015  . Smokeless tobacco: Never Used  . Alcohol use Yes    Review of Systems Constitutional: Negative for fever/chills Eyes: No visual changes. ENT:  Negative for sore throat and for difficulty  swallowing Cardiovascular: Denies chest pain. Respiratory: Denies cough Denies shortness of breath. Gastrointestinal: No abdominal pain.  No nausea, vomiting, diarrhea. Musculoskeletal: Left side thoracic and lumbar back pain. Skin: Negative for rash. Neurological: Negative for headaches.  Negative focal weakness or numbness. Unsure of loss of consciousness.. Able to ambulate. ____________________________________________   PHYSICAL EXAM:  VITAL SIGNS: ED Triage Vitals  Enc Vitals Group     BP 12/14/16 0958 (!) 115/54     Pulse Rate 12/14/16 0956 74     Resp 12/14/16 0956 20     Temp 12/14/16 0956 97.6 F (36.4 C)     Temp Source 12/14/16 0956 Oral     SpO2 12/14/16 0956 97 %  Weight 12/14/16 0957 270 lb (122.5 kg)     Height 12/14/16 0957 5\' 8"  (1.727 m)     Head Circumference --      Peak Flow --      Pain Score 12/14/16 0956 8     Pain Loc --      Pain Edu? --      Excl. in GC? --     Constitutional: Alert and oriented. Well appearing and in no acute distress.  Head: Normocephalic and atraumatic. Eyes: Conjunctivae are normal. PERRL. Normal extraocular movements.  Cardiovascular: Normal rate, regular rhythm. Normal distal pulses. Respiratory: Normal respiratory effort.  Musculoskeletal: Left side thoracic and lumbar back pain without radiculopathy. Negative spinous process tenderness. Pain is musculoskeletal in nature, tenderness to palpation along the left side thoracic and lumbar paraspinals Neurologic: Normal speech and language. No gross focal neurologic deficits are appreciated. No gait instability. No sensory loss or abnormal reflexes.  Skin:  Skin is warm, dry and intact. No rash noted. Psychiatric: Mood and affect are normal.  ____________________________________________   LABS (all labs ordered are listed, but only abnormal results are displayed)  Labs Reviewed - No data to  display ____________________________________________  EKG None ____________________________________________  RADIOLOGY CT head without contrast IMPRESSION: No acute or traumatic finding. Brain atrophy, similar to the previous exam.  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  ____________________________________________   PROCEDURES  Procedure(s) performed:     Critical Care performed: no ____________________________________________   INITIAL IMPRESSION / ASSESSMENT AND PLAN / ED COURSE  Pertinent labs & imaging results that were available during my care of the patient were reviewed by me and considered in my medical decision making (see chart for details).  Patient presents with left side thoracic and lumbar back pain since falling off a stool yesterday. Patient has been ambulatory since the injury. Physical exam and imaging are reassuring for no acute fracture or neurovascular injury. Pain and symptoms are consistent with muscular skeletal strain and muscle spasm. Vital signs were reassuring. Upon reviewing with patient options for pain management, patient reported he had to drive home and elected to go home with a prescription for Flexeril and began medication at home. Recommended patient follow-up with his neurologist regarding recent fall. Patient informed of clinical course, understand medical decision-making process, and agree with plan.  Patient was advised to follow up with PCP as needed and was also advised to return to the emergency department for symptoms that change or worsen.      ____________________________________________   FINAL CLINICAL IMPRESSION(S) / ED DIAGNOSES  Final diagnoses:  Strain of lumbar region, initial encounter  Fall, initial encounter  Muscle spasm of back       NEW MEDICATIONS STARTED DURING THIS VISIT:  Discharge Medication List as of 12/14/2016 11:52 AM    START taking these medications   Details   cyclobenzaprine (FLEXERIL) 5 MG tablet Take 1 tablet (5 mg total) by mouth 3 (three) times daily as needed., Starting Mon 12/14/2016, Print         Note:  This document was prepared using Dragon voice recognition software and may include unintentional dictation errors.    Usbaldo Pannone, Karl Pockraci M, PA-C 12/14/16 1823    Willy Eddyobinson, Patrick, MD 12/15/16 [phone]

## 2016-12-14 NOTE — ED Notes (Signed)
See triage note  States he was standing on small stool  Lost balance and fell back hit screen door   Having pain to back and head

## 2016-12-14 NOTE — ED Triage Notes (Signed)
Pt reports falling off 301ft step stool last night, fell through screen door and glass door. Pt unsure of LOC, reports some small lacerations to hand and back.

## 2016-12-16 ENCOUNTER — Other Ambulatory Visit: Payer: Self-pay | Admitting: Specialist

## 2016-12-16 DIAGNOSIS — R0609 Other forms of dyspnea: Secondary | ICD-10-CM

## 2016-12-16 DIAGNOSIS — R0600 Dyspnea, unspecified: Secondary | ICD-10-CM

## 2016-12-16 DIAGNOSIS — I429 Cardiomyopathy, unspecified: Secondary | ICD-10-CM

## 2016-12-17 ENCOUNTER — Ambulatory Visit: Payer: BLUE CROSS/BLUE SHIELD | Admitting: Physical Therapy

## 2016-12-17 ENCOUNTER — Encounter: Payer: Self-pay | Admitting: Physical Therapy

## 2016-12-17 DIAGNOSIS — M25511 Pain in right shoulder: Principal | ICD-10-CM

## 2016-12-17 DIAGNOSIS — R41841 Cognitive communication deficit: Secondary | ICD-10-CM

## 2016-12-17 DIAGNOSIS — R293 Abnormal posture: Secondary | ICD-10-CM

## 2016-12-17 DIAGNOSIS — G8929 Other chronic pain: Secondary | ICD-10-CM

## 2016-12-17 DIAGNOSIS — M25611 Stiffness of right shoulder, not elsewhere classified: Secondary | ICD-10-CM

## 2016-12-17 DIAGNOSIS — M6281 Muscle weakness (generalized): Secondary | ICD-10-CM

## 2016-12-17 DIAGNOSIS — M25512 Pain in left shoulder: Principal | ICD-10-CM

## 2016-12-17 DIAGNOSIS — M25612 Stiffness of left shoulder, not elsewhere classified: Secondary | ICD-10-CM
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
## 2016-12-21 ENCOUNTER — Encounter: Payer: Self-pay | Admitting: Physical Therapy

## 2016-12-21 ENCOUNTER — Ambulatory Visit: Payer: BLUE CROSS/BLUE SHIELD | Admitting: Physical Therapy

## 2016-12-21 DIAGNOSIS — M25611 Stiffness of right shoulder, not elsewhere classified: Secondary | ICD-10-CM

## 2016-12-21 DIAGNOSIS — M25511 Pain in right shoulder: Principal | ICD-10-CM

## 2016-12-21 DIAGNOSIS — G8929 Other chronic pain: Secondary | ICD-10-CM

## 2016-12-21 DIAGNOSIS — R41841 Cognitive communication deficit: Secondary | ICD-10-CM

## 2016-12-21 DIAGNOSIS — M25612 Stiffness of left shoulder, not elsewhere classified: Secondary | ICD-10-CM

## 2016-12-21 DIAGNOSIS — M25512 Pain in left shoulder: Principal | ICD-10-CM

## 2016-12-21 DIAGNOSIS — R293 Abnormal posture: Secondary | ICD-10-CM

## 2016-12-21 DIAGNOSIS — M6281 Muscle weakness (generalized): Secondary | ICD-10-CM
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
## 2016-12-22 NOTE — Progress Notes (Deleted)
[facility]  Clinic day:  12/22/2016  Chief Complaint: Kevin Pearson is a 66 y.o. male with iron deficiency anemia who is referred in consultation by Dr. Mady Haagensen for assessment and management.  HPI: ***  Labs on 11/13/2016 revealed a hematocrit of 35.6, hemoglobin 12, MCV 85, platelets 247,000, white count 9200 with an ANC of 6200. SPEP was negative. Creatinine was 1.42. Ferritin was 18.  Iron saturation was 12% with a TIBC of 447. Folate was 14.1 and B12 309 on 09/07/2016.  TSH was normal.   Past Medical History:  Diagnosis Date  . Acid reflux   . Anxiety   . Arthritis   . Atrial fibrillation (HCC)   . CHF (congestive heart failure) (HCC)   . Chronic orthostatic hypotension   . Clotting disorder (HCC)   . COPD (chronic obstructive pulmonary disease) (HCC)   . Depression   . Elevated PSA   . Heart attack (HCC)   . Heart disease   . Heart failure (HCC)   . Hepatitis   . High cholesterol   . Hypertension   . Sleep apnea   . TBI (traumatic brain injury) (HCC)   . Urinary retention     Past Surgical History:  Procedure Laterality Date  . BLADDER SURGERY    . cardiac stents    . CYSTOSCOPY WITH URETHRAL DILATATION N/A 06/25/2016   Procedure: CYSTOSCOPY WITH URETHRAL DILATATION;  Surgeon: Vanna Scotland, MD;  Location: [facility] ORS;  Service: Urology;  Laterality: N/A;  . EP IMPLANTABLE DEVICE     St. Jude BiV-ICD  . GREEN LIGHT LASER TURP (TRANSURETHRAL RESECTION OF PROSTATE  2016   done in [facility]     Family History  Problem Relation Age of Onset  . Other Father        Cerebral hemorrhage  . Kidney cancer Neg Hx   . Kidney disease Neg Hx   . Prostate cancer Neg Hx     Social History:  reports that he quit smoking about 16 months ago. His smoking use included Cigarettes. He has never used smokeless tobacco. He reports that he drinks alcohol. He reports that he does not use drugs.  The patient is accompanied by *** alone  today.  Allergies:  Allergies  Allergen Reactions  . Other Anaphylaxis and Itching    Mango skin  . Codeine Nausea And Vomiting    Current Medications: Current Outpatient Prescriptions  Medication Sig Dispense Refill  . acetaminophen (TYLENOL) 325 MG tablet Take 650 mg by mouth every 6 (six) hours as needed.    Marland Kitchen albuterol (PROVENTIL HFA;VENTOLIN HFA) 108 (90 Base) MCG/ACT inhaler Inhale 2 puffs into the lungs every 4 (four) hours as needed for shortness of breath.    . budesonide (PULMICORT) 0.5 MG/2ML nebulizer solution Inhale 2 mLs into the lungs daily.  1  . carvedilol (COREG) 6.25 MG tablet Take 6.25 mg by mouth 2 (two) times daily.    . clonazePAM (KLONOPIN) 1 MG tablet Take 0.5 mg by mouth 3 (three) times daily.     . clopidogrel (PLAVIX) 75 MG tablet Take 75 mg by mouth daily.    . cyclobenzaprine (FLEXERIL) 5 MG tablet Take 1 tablet (5 mg total) by mouth 3 (three) times daily as needed. 20 tablet 0  . divalproex (DEPAKOTE ER) 500 MG 24 hr tablet Take 500-750 mg by mouth 2 (two) times daily. Take 500 mg in the morning and 750 mg at night.    . furosemide (  LASIX) 40 MG tablet Take 40 mg by mouth 2 (two) times daily.    Marland Kitchen gabapentin (NEURONTIN) 300 MG capsule Take 600 mg by mouth 3 (three) times daily. Take 300 mg in the morning, 300 mg at 2 pm and 300 mg at night.    . hydrALAZINE (APRESOLINE) 25 MG tablet Take 25 mg by mouth 3 (three) times daily.    Marland Kitchen Hyoscyamine Sulfate SL (LEVSIN/SL) 0.125 MG SUBL Place 1 tablet under the tongue daily. 30 each 0  . ipratropium-albuterol (DUONEB) 0.5-2.5 (3) MG/3ML SOLN Take 3 mLs by nebulization every 6 (six) hours as needed (wheezing/shortness of breath).    . Melatonin 1 MG TABS Take 6 tablets by mouth at bedtime.     . nitroGLYCERIN (NITROSTAT) 0.4 MG SL tablet Place 0.4 mg under the tongue every 5 (five) minutes as needed for chest pain.    Marland Kitchen omeprazole (PRILOSEC) 40 MG capsule Take 40 mg by mouth daily.    . rivaroxaban (XARELTO) 20 MG  TABS tablet Take 20 mg by mouth at bedtime.    . rosuvastatin (CRESTOR) 20 MG tablet Take 20 mg by mouth daily.    . sacubitril-valsartan (ENTRESTO) 24-26 MG Take 1 tablet by mouth 2 (two) times daily. 60 tablet 3  . tamsulosin (FLOMAX) 0.4 MG CAPS capsule TAKE ONE CAPSULE BY MOUTH EVERY DAY AFTER SUPPER 30 capsule 3  . traMADol (ULTRAM) 50 MG tablet Take 50 mg by mouth every 6 (six) hours as needed for moderate pain or severe pain.     . traZODone (DESYREL) 100 MG tablet Take 100 mg by mouth at bedtime.  5   No current facility-administered medications for this visit.     Review of Systems:  GENERAL:  Feels good.  Active.  No fevers, sweats or weight loss. PERFORMANCE STATUS (ECOG):  *** HEENT:  No visual changes, runny nose, sore throat, mouth sores or tenderness. Lungs: No shortness of breath or cough.  No hemoptysis. Cardiac:  No chest pain, palpitations, orthopnea, or PND. GI:  No nausea, vomiting, diarrhea, constipation, melena or hematochezia. GU:  No urgency, frequency, dysuria, or hematuria. Musculoskeletal:  No back pain.  No joint pain.  No muscle tenderness. Extremities:  No pain or swelling. Skin:  No rashes or skin changes. Neuro:  No headache, numbness or weakness, balance or coordination issues. Endocrine:  No diabetes, thyroid issues, hot flashes or night sweats. Psych:  No mood changes, depression or anxiety. Pain:  No focal pain. Review of systems:  All other systems reviewed and found to be negative.   Physical Exam: There were no vitals taken for this visit. GENERAL:  Well developed, well nourished, sitting comfortably in the exam room in no acute distress. MENTAL STATUS:  Alert and oriented to person, place and time. HEAD:  *** hair.  Normocephalic, atraumatic, face symmetric, no Cushingoid features. EYES:  *** eyes.  Pupils equal round and reactive to light and accomodation.  No conjunctivitis or scleral icterus. ENT:  Oropharynx clear without lesion.  Tongue  normal. Mucous membranes moist.  RESPIRATORY:  Clear to auscultation without rales, wheezes or rhonchi. CARDIOVASCULAR:  Regular rate and rhythm without murmur, rub or gallop. ABDOMEN:  Soft, non-tender, with active bowel sounds, and no hepatosplenomegaly.  No masses. SKIN:  No rashes, ulcers or lesions. EXTREMITIES: No edema, no skin discoloration or tenderness.  No palpable cords. LYMPH NODES: No palpable cervical, supraclavicular, axillary or inguinal adenopathy  NEUROLOGICAL: Unremarkable. PSYCH:  Appropriate.  No visits with results within  3 Day(s) from this visit.  Latest known visit with results is:  Hospital Outpatient Visit on 12/11/2016  Component Date Value Ref Range Status  . Color, Urine 12/11/2016 YELLOW  YELLOW Final  . APPearance 12/11/2016 CLEAR  CLEAR Final  . Specific Gravity, Urine 12/11/2016 1.020  1.005 - 1.030 Final  . pH 12/11/2016 5.5  5.0 - 8.0 Final  . Glucose, UA 12/11/2016 NEGATIVE  NEGATIVE mg/dL Final  . Hgb urine dipstick 12/11/2016 NEGATIVE  NEGATIVE Final  . Bilirubin Urine 12/11/2016 NEGATIVE  NEGATIVE Final  . Ketones, ur 12/11/2016 TRACE* NEGATIVE mg/dL Final  . Protein, ur 96/04/540906/02/2017 NEGATIVE  NEGATIVE mg/dL Final  . Nitrite 81/19/147806/02/2017 NEGATIVE  NEGATIVE Final  . Leukocytes, UA 12/11/2016 NEGATIVE  NEGATIVE Final  . Squamous Epithelial / LPF 12/11/2016 0-5* NONE SEEN Final  . WBC, UA 12/11/2016 0-5  0 - 5 WBC/hpf Final  . RBC / HPF 12/11/2016 0-5  0 - 5 RBC/hpf Final  . Bacteria, UA 12/11/2016 FEW* NONE SEEN Final  . Hyaline Casts, UA 12/11/2016 PRESENT   Final  . Specimen Description 12/11/2016 URINE, CLEAN CATCH   Final  . Special Requests 12/11/2016 NONE   Final  . Culture 12/11/2016 *  Final                   Value:<10,000 COLONIES/mL INSIGNIFICANT GROWTH Performed at [facility], [address] [place], [place] [address]   . Report Status 12/11/2016 12/13/2016 FINAL   Final    Assessment:  Camelia EngCarmen Curet is a 66 y.o. male  ***  Plan: 1. *** 2. *** 3. *** 4. *** 5. ***  Rosey BathMelissa C Corcoran, MD  12/22/2016, 6:02 PM

## 2016-12-23 ENCOUNTER — Ambulatory Visit
Admission: RE | Admit: 2016-12-23 | Discharge: 2016-12-23 | Disposition: A | Discharge status: Home / Self Care | Payer: BLUE CROSS/BLUE SHIELD | Source: Ambulatory Visit | Attending: Specialist | Admitting: Specialist

## 2016-12-23 ENCOUNTER — Inpatient Hospital Stay: Payer: BLUE CROSS/BLUE SHIELD | Attending: Hematology and Oncology | Admitting: Hematology and Oncology

## 2016-12-23 ENCOUNTER — Ambulatory Visit: Payer: BLUE CROSS/BLUE SHIELD | Admitting: Hematology and Oncology

## 2016-12-23 DIAGNOSIS — I429 Cardiomyopathy, unspecified: Secondary | ICD-10-CM | POA: Diagnosis present

## 2016-12-23 DIAGNOSIS — R0609 Other forms of dyspnea: Secondary | ICD-10-CM | POA: Diagnosis present

## 2016-12-23 DIAGNOSIS — R0600 Dyspnea, unspecified: Secondary | ICD-10-CM

## 2016-12-23 DIAGNOSIS — R918 Other nonspecific abnormal finding of lung field: Secondary | ICD-10-CM | POA: Diagnosis not present

## 2016-12-23 DIAGNOSIS — I2510 Atherosclerotic heart disease of native coronary artery without angina pectoris: Secondary | ICD-10-CM | POA: Insufficient documentation

## 2016-12-23 DIAGNOSIS — I700 Atherosclerosis of aorta: Secondary | ICD-10-CM | POA: Diagnosis not present

## 2016-12-23 DIAGNOSIS — K449 Diaphragmatic hernia without obstruction or gangrene: Secondary | ICD-10-CM | POA: Insufficient documentation

## 2016-12-24 ENCOUNTER — Ambulatory Visit: Payer: BLUE CROSS/BLUE SHIELD | Admitting: Physical Therapy

## 2016-12-24 DIAGNOSIS — M6281 Muscle weakness (generalized): Secondary | ICD-10-CM

## 2016-12-24 DIAGNOSIS — M25612 Stiffness of left shoulder, not elsewhere classified: Secondary | ICD-10-CM

## 2016-12-24 DIAGNOSIS — R293 Abnormal posture: Secondary | ICD-10-CM

## 2016-12-24 DIAGNOSIS — M25512 Pain in left shoulder: Principal | ICD-10-CM

## 2016-12-24 DIAGNOSIS — M25511 Pain in right shoulder: Secondary | ICD-10-CM | POA: Diagnosis not present

## 2016-12-24 DIAGNOSIS — G8929 Other chronic pain: Secondary | ICD-10-CM

## 2016-12-24 DIAGNOSIS — M25611 Stiffness of right shoulder, not elsewhere classified: Secondary | ICD-10-CM
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
## 2016-12-28 ENCOUNTER — Encounter: Payer: Self-pay | Admitting: Physical Therapy

## 2016-12-28 ENCOUNTER — Ambulatory Visit: Payer: BLUE CROSS/BLUE SHIELD | Admitting: Physical Therapy

## 2016-12-28 DIAGNOSIS — M25512 Pain in left shoulder: Principal | ICD-10-CM

## 2016-12-28 DIAGNOSIS — M25612 Stiffness of left shoulder, not elsewhere classified: Secondary | ICD-10-CM

## 2016-12-28 DIAGNOSIS — R293 Abnormal posture: Secondary | ICD-10-CM

## 2016-12-28 DIAGNOSIS — M6281 Muscle weakness (generalized): Secondary | ICD-10-CM

## 2016-12-28 DIAGNOSIS — M25611 Stiffness of right shoulder, not elsewhere classified: Secondary | ICD-10-CM

## 2016-12-28 DIAGNOSIS — M25511 Pain in right shoulder: Principal | ICD-10-CM

## 2016-12-28 DIAGNOSIS — G8929 Other chronic pain: Secondary | ICD-10-CM
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
## 2016-12-31 ENCOUNTER — Encounter: Payer: Self-pay | Admitting: Physical Therapy

## 2016-12-31 ENCOUNTER — Ambulatory Visit: Payer: BLUE CROSS/BLUE SHIELD | Admitting: Physical Therapy

## 2016-12-31 DIAGNOSIS — M25512 Pain in left shoulder: Principal | ICD-10-CM

## 2016-12-31 DIAGNOSIS — R293 Abnormal posture: Secondary | ICD-10-CM

## 2016-12-31 DIAGNOSIS — M25511 Pain in right shoulder: Secondary | ICD-10-CM | POA: Diagnosis not present

## 2016-12-31 DIAGNOSIS — M6281 Muscle weakness (generalized): Secondary | ICD-10-CM

## 2016-12-31 DIAGNOSIS — M25612 Stiffness of left shoulder, not elsewhere classified: Secondary | ICD-10-CM

## 2016-12-31 DIAGNOSIS — M25611 Stiffness of right shoulder, not elsewhere classified: Secondary | ICD-10-CM

## 2016-12-31 DIAGNOSIS — G8929 Other chronic pain: Secondary | ICD-10-CM
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
## 2017-01-04 ENCOUNTER — Ambulatory Visit: Payer: BLUE CROSS/BLUE SHIELD | Attending: Family | Admitting: Family

## 2017-01-04 ENCOUNTER — Encounter: Payer: Self-pay | Admitting: Family

## 2017-01-04 VITALS — BP 100/60 | HR 70 | Resp 20 | Ht 68.0 in | Wt 276.5 lb

## 2017-01-04 DIAGNOSIS — M549 Dorsalgia, unspecified: Secondary | ICD-10-CM | POA: Insufficient documentation

## 2017-01-04 DIAGNOSIS — W19XXXA Unspecified fall, initial encounter: Secondary | ICD-10-CM | POA: Insufficient documentation

## 2017-01-04 DIAGNOSIS — I950 Idiopathic hypotension: Secondary | ICD-10-CM

## 2017-01-04 DIAGNOSIS — F329 Major depressive disorder, single episode, unspecified: Secondary | ICD-10-CM | POA: Diagnosis not present

## 2017-01-04 DIAGNOSIS — Z87891 Personal history of nicotine dependence: Secondary | ICD-10-CM | POA: Diagnosis not present

## 2017-01-04 DIAGNOSIS — J449 Chronic obstructive pulmonary disease, unspecified: Secondary | ICD-10-CM | POA: Diagnosis not present

## 2017-01-04 DIAGNOSIS — I951 Orthostatic hypotension: Secondary | ICD-10-CM | POA: Insufficient documentation

## 2017-01-04 DIAGNOSIS — I5022 Chronic systolic (congestive) heart failure: Secondary | ICD-10-CM | POA: Diagnosis present

## 2017-01-04 DIAGNOSIS — I110 Hypertensive heart disease with heart failure: Secondary | ICD-10-CM | POA: Insufficient documentation

## 2017-01-04 DIAGNOSIS — K219 Gastro-esophageal reflux disease without esophagitis: Secondary | ICD-10-CM | POA: Insufficient documentation

## 2017-01-04 DIAGNOSIS — Z9981 Dependence on supplemental oxygen: Secondary | ICD-10-CM | POA: Insufficient documentation

## 2017-01-04 DIAGNOSIS — E7800 Pure hypercholesterolemia, unspecified: Secondary | ICD-10-CM | POA: Insufficient documentation

## 2017-01-04 DIAGNOSIS — F419 Anxiety disorder, unspecified: Secondary | ICD-10-CM | POA: Diagnosis not present

## 2017-01-04 DIAGNOSIS — I252 Old myocardial infarction: Secondary | ICD-10-CM | POA: Insufficient documentation

## 2017-01-04 DIAGNOSIS — I4891 Unspecified atrial fibrillation: Secondary | ICD-10-CM | POA: Diagnosis not present

## 2017-01-04 DIAGNOSIS — G4733 Obstructive sleep apnea (adult) (pediatric): Secondary | ICD-10-CM | POA: Diagnosis not present

## 2017-01-04 DIAGNOSIS — R062 Wheezing: Secondary | ICD-10-CM | POA: Insufficient documentation

## 2017-01-04 DIAGNOSIS — F32A Depression, unspecified: Secondary | ICD-10-CM

## 2017-01-04 DIAGNOSIS — Z7901 Long term (current) use of anticoagulants: Secondary | ICD-10-CM | POA: Insufficient documentation

## 2017-01-04 DIAGNOSIS — Z79899 Other long term (current) drug therapy: Secondary | ICD-10-CM | POA: Diagnosis not present

## 2017-01-04 LAB — BASIC METABOLIC PANEL
Anion gap: 6 (ref 5–15)
BUN: 25 mg/dL — ABNORMAL HIGH (ref 6–20)
CO2: 30 mmol/L (ref 22–32)
Calcium: 9.9 mg/dL (ref 8.9–10.3)
Chloride: 101 mmol/L (ref 101–111)
Creatinine, Ser: 1.37 mg/dL — ABNORMAL HIGH (ref 0.61–1.24)
GFR calc Af Amer: >60 mL/min (ref >60)
GFR calc non Af Amer: 53 mL/min — ABNORMAL LOW (ref >60)
Glucose, Bld: 103 mg/dL — ABNORMAL HIGH (ref 65–99)
Potassium: 4.3 mmol/L (ref 3.5–5.1)
Sodium: 137 mmol/L (ref 135–145)

## 2017-01-04 NOTE — Progress Notes (Signed)
Patient ID: Kevin Pearson, male    DOB: 03-24-1951, 66 y.o.   MRN: [id number]  HPI  Kevin Pearson is a 66 y/o male with a history of GERD, anxiety, atrial fibrillation, depression, COPD (chronic O2), TBI, MI (multiple stents), hepatitis, hyperlipidemia, HTN, obstructive sleep apnea (without CPAP), remote tobacco/drug/alcohol use and chronic heart failure.   Reviewed last echo report on 02/14/16 which showed an EF of 35%.   Was in the ED 12/14/16 due to back pain after a mechanical fall. Treated and released. Was in the ED 10/06/16 due to hemoptysis. Evaluated and discharged home. ED visit on 07/27/16 due to chest pain. Troponins were negative and he was discharged home. Was in the ED 07/02/16 due to bladder spasms. Treated and discharged home. Admitted 06/24/16 due to urinary retention. Had urethral dilation done and was discharged the next day.   He presents today for his follow-up visit with a chief complaint of moderate fatigue with little exertion. He describes it as chronic in nature and has been present for the last few years. Does wax and wane in severity. Fatigue does improve slightly with rest. Has associated shortness of breath, pedal edema, and light-headedness along with this.  Past Medical History:  Diagnosis Date  . Acid reflux   . Anxiety   . Arthritis   . Atrial fibrillation (HCC)   . CHF (congestive heart failure) (HCC)   . Chronic orthostatic hypotension   . Clotting disorder (HCC)   . COPD (chronic obstructive pulmonary disease) (HCC)   . Depression   . Elevated PSA   . Heart attack (HCC)   . Heart disease   . Heart failure (HCC)   . Hepatitis   . High cholesterol   . Hypertension   . Sleep apnea   . TBI (traumatic brain injury) (HCC)   . Urinary retention    Past Surgical History:  Procedure Laterality Date  . BLADDER SURGERY    . cardiac stents    . CYSTOSCOPY WITH URETHRAL DILATATION N/A 06/25/2016   Procedure: CYSTOSCOPY WITH URETHRAL DILATATION;  Surgeon: Vanna Scotland, MD;  Location: [facility] ORS;  Service: Urology;  Laterality: N/A;  . EP IMPLANTABLE DEVICE     St. Jude BiV-ICD  . GREEN LIGHT LASER TURP (TRANSURETHRAL RESECTION OF PROSTATE  2016   done in [facility]    Family History  Problem Relation Age of Onset  . Other Father        Cerebral hemorrhage  . Kidney cancer Neg Hx   . Kidney disease Neg Hx   . Prostate cancer Neg Hx    Social History  Substance Use Topics  . Smoking status: Former Smoker    Types: Cigarettes    Quit date: 08/2015  . Smokeless tobacco: Never Used  . Alcohol use Yes   Allergies  Allergen Reactions  . Other Anaphylaxis and Itching    Mango skin  . Codeine Nausea And Vomiting   Prior to Admission medications   Medication Sig Start Date End Date Taking? Authorizing Provider  acetaminophen (TYLENOL) 325 MG tablet Take 650 mg by mouth every 6 (six) hours as needed.   Yes [provider]  albuterol (PROVENTIL HFA;VENTOLIN HFA) 108 (90 Base) MCG/ACT inhaler Inhale 2 puffs into the lungs every 4 (four) hours as needed for shortness of breath.   Yes [provider]  budesonide (PULMICORT) 0.5 MG/2ML nebulizer solution Inhale 2 mLs into the lungs daily. 09/26/16  Yes [provider]  carvedilol (COREG) 6.25 MG  tablet Take 6.25 mg by mouth 2 (two) times daily.   Yes [provider]  clonazePAM (KLONOPIN) 1 MG tablet Take 0.5 mg by mouth 3 (three) times daily.    Yes [provider]  clopidogrel (PLAVIX) 75 MG tablet Take 75 mg by mouth daily.   Yes [provider]  divalproex (DEPAKOTE ER) 500 MG 24 hr tablet Take 500-750 mg by mouth 2 (two) times daily. Take 500 mg in the morning and 750 mg at night.   Yes [provider]  furosemide (LASIX) 40 MG tablet Take 40 mg by mouth 2 (two) times daily.   Yes [provider]  gabapentin (NEURONTIN) 300 MG capsule Take 600 mg by mouth 3 (three) times daily. Take 300 mg in the morning, 300 mg at 2 pm and 300 mg at  night.   Yes [provider]  hydrALAZINE (APRESOLINE) 25 MG tablet Take 25 mg by mouth 3 (three) times daily.   Yes [provider]  Hyoscyamine Sulfate SL (LEVSIN/SL) 0.125 MG SUBL Place 1 tablet under the tongue daily. 07/07/16  Yes McGowan, Carollee HerterShannon A, PA-C  ipratropium-albuterol (DUONEB) 0.5-2.5 (3) MG/3ML SOLN Take 3 mLs by nebulization every 6 (six) hours as needed (wheezing/shortness of breath).   Yes [provider]  Melatonin 1 MG TABS Take 6 tablets by mouth at bedtime.    Yes [provider]  nitroGLYCERIN (NITROSTAT) 0.4 MG SL tablet Place 0.4 mg under the tongue every 5 (five) minutes as needed for chest pain.   Yes [provider]  omeprazole (PRILOSEC) 40 MG capsule Take 40 mg by mouth daily.   Yes [provider]  potassium chloride SA (K-DUR,KLOR-CON) 20 MEQ tablet Take 20 mEq by mouth 2 (two) times daily.   Yes [provider]  rivaroxaban (XARELTO) 20 MG TABS tablet Take 20 mg by mouth at bedtime.   Yes [provider]  rosuvastatin (CRESTOR) 20 MG tablet Take 20 mg by mouth daily.   Yes [provider]  sacubitril-valsartan (ENTRESTO) 24-26 MG Take 1 tablet by mouth 2 (two) times daily. 11/19/16  Yes Clarisa KindredHackney, Tina A, FNP  tamsulosin (FLOMAX) 0.4 MG CAPS capsule TAKE ONE CAPSULE BY MOUTH EVERY DAY AFTER SUPPER 11/23/16  Yes Vanna ScotlandBrandon, Ashley, MD  traMADol (ULTRAM) 50 MG tablet Take 50 mg by mouth every 6 (six) hours as needed for moderate pain or severe pain.    Yes [provider]  traZODone (DESYREL) 100 MG tablet Take 100 mg by mouth at bedtime. 09/23/16  Yes [provider]    Review of Systems  Constitutional: Positive for fatigue. Negative for appetite change.  HENT: Negative for congestion, postnasal drip and sore throat.   Eyes: Negative.   Respiratory: Positive for shortness of breath. Negative for cough and chest tightness.   Cardiovascular: Positive for leg swelling. Negative  for chest pain and palpitations.  Gastrointestinal: Positive for abdominal distention. Negative for abdominal pain.  Endocrine: Negative.   Genitourinary: Negative.   Musculoskeletal: Positive for arthralgias (bilateral foot pain). Negative for back pain.  Skin: Negative.   Allergic/Immunologic: Negative.   Neurological: Positive for tremors, light-headedness (when changing positions too quickly) and numbness (bilateral feet). Negative for dizziness.       Memory issues   Hematological: Negative for adenopathy. Does not bruise/bleed easily.  Psychiatric/Behavioral: Negative for dysphoric mood and sleep disturbance (sleeping on 2 pillows; wearing oxygen at 2L around the clock). The patient is nervous/anxious.    Vitals:   01/04/17  1004 01/04/17 1056  BP: (!) 83/49 100/60  Pulse: 70   Resp: 20   SpO2: 96%   Weight: 276 lb 8 oz (125.4 kg)   Height: 5\' 8"  (1.727 m)    Wt Readings from Last 3 Encounters:  01/04/17 276 lb 8 oz (125.4 kg)  12/14/16 270 lb (122.5 kg)  12/01/16 272 lb (123.4 kg)    Lab Results  Component Value Date   CREATININE 1.33 (H) 12/01/2016   CREATININE 1.26 (H) 10/06/2016   CREATININE 1.25 (H) 07/27/2016   Physical Exam  Constitutional: He is oriented to person, place, and time. He appears well-developed and well-nourished.  HENT:  Head: Normocephalic and atraumatic.  Neck: Normal range of motion. Neck supple. No JVD present.  Cardiovascular: Normal rate and regular rhythm.   Pulmonary/Chest: Effort normal. He has no wheezes. He has no rales.  Abdominal: Soft. He exhibits distension. There is no tenderness.  Musculoskeletal: He exhibits edema (2+ pitting edema in bilateral lower legs). He exhibits no tenderness.  Neurological: He is alert and oriented to person, place, and time.  Skin: Skin is warm and dry.  Psychiatric: He has a normal mood and affect. His behavior is normal.  Nursing note and vitals reviewed.  Assessment & Plan:  1: Chronic heart  failure with reduced ejection fraction- - NYHA class III - mildly fluid overloaded  - has been taking furosemide 40mg  BID  - has not been weighing daily. Instructed to weigh every morning, write the weight down and call for an overnight weight gain of >2 pounds or a weekly weight gain of >5 pounds. By our scale, he's gained 4.8 pounds since last year - not adding salt to his food. Reviewed a 2000mg  sodium diet  - continues to drink excessive fluids; counseled on the importance of keeping fluid intake to close to 48 ounces daily - saw cardiologist (Paraschos) 10/26/16 and returns to him July 2018 - wearing compression socks - tolerating entresto without known side effects; called daughter to confirm that he's not taking losartan with entresto as it was still listed on his med list that he brought (she confirmed he was taking entresto and not losartan) - will check a BMP today - Pharm D reviewed medications with patient  2: Hypotension- - BP low but improved upon recheck with a manual cuff - saw PCP ([place]) 12/02/16  3: Obstructive sleep apnea- - wears oxygen at 2L around the clock - unable to tolerate wearing CPAP due to anxiety/claustrophobia - saw pulmonologist Meredeth Ide) 12/16/16  4: Anxiety/depression- - recently moved into his own apartment and he's still adjusting - having difficulties with his daughter & son and is working on distancing himself from them - emotional support offered - asked who we should call as an emergency contact since his daughter is currently listed and he says that for now, she needs to remain a contact as he's not doing his own medications yet [facility] made  Medication list was reviewed.  Return in 2 months or sooner for any questions/problems before then.

## 2017-01-04 NOTE — Patient Instructions (Signed)
Resume weighing daily and call for an overnight weight gain of > 2 pounds or a weekly weight gain of >5 pounds. 

## 2017-01-07 ENCOUNTER — Ambulatory Visit: Payer: BLUE CROSS/BLUE SHIELD | Admitting: Family

## 2017-01-08 ENCOUNTER — Encounter: Payer: BLUE CROSS/BLUE SHIELD | Admitting: Physical Therapy

## 2017-01-12 ENCOUNTER — Telehealth: Payer: Self-pay | Admitting: Family

## 2017-01-12 NOTE — Telephone Encounter (Signed)
Lem[facility] with [facility] would like an order for pt to have PT twice weekly for two weeks. Routed to Clarisa Kindredina Hackney, FNP who placed the order.

## 2017-01-13 ENCOUNTER — Telehealth: Payer: Self-pay | Admitting: Family

## 2017-01-13 ENCOUNTER — Encounter: Payer: BLUE CROSS/BLUE SHIELD | Admitting: Physical Therapy

## 2017-01-13 NOTE — Telephone Encounter (Signed)
[facility] for [facility] to receive PT twice a week for the next 2 weeks.

## 2017-01-20 ENCOUNTER — Encounter: Payer: BLUE CROSS/BLUE SHIELD | Admitting: Physical Therapy

## 2017-01-21 ENCOUNTER — Other Ambulatory Visit: Payer: Self-pay

## 2017-01-21 ENCOUNTER — Telehealth: Payer: Self-pay | Admitting: Urology

## 2017-01-21 MED ORDER — SACUBITRIL-VALSARTAN 24-26 MG PO TABS: 1.0000 | ORAL_TABLET | Freq: Two times a day (BID) | ORAL | 3 refills | Status: DC

## 2017-01-21 NOTE — Telephone Encounter (Signed)
Pt called office, LMOM that he needs to speak to Dr. Delana MeyerBrandon's nurse, did not leave details other than he has a bit of a situation going on that he needs to speak to the nurse when possible.  Please advise. Thanks.

## 2017-01-21 NOTE — Telephone Encounter (Signed)
Called patient. Advised unable to hear due to phone going in out. Patient states not in good service area. Call then disconnected. Attempted to call patient back 2 times, no answer.

## 2017-01-27 ENCOUNTER — Telehealth: Payer: Self-pay | Admitting: Cardiovascular Disease

## 2017-01-27 NOTE — Telephone Encounter (Signed)
Pt of CHF clinic and Dr. Darrold JunkerParaschos.  Forwarding to Clarisa Kindredina Hackney, FNP.

## 2017-01-27 NOTE — Telephone Encounter (Signed)
Nurse with [facility] states pt woke up with generalized pain and malaise. Sounds congested. Temp is fine, no fever. BP is elevated at 140/82. States this is the final home health visit, and is being D/C today from home health. Weight is steady. Pt would like to be referred to cardiac rehab.

## 2017-02-09 ENCOUNTER — Ambulatory Visit: Payer: BLUE CROSS/BLUE SHIELD | Admitting: Physical Therapy

## 2017-02-10 ENCOUNTER — Telehealth: Payer: Self-pay | Admitting: Urology

## 2017-02-10 NOTE — Telephone Encounter (Signed)
Pt called and left a message.  He would like to speak to a doctor.  Please give him a call.  [phone].  He didn't say why he wanted someone to call him.

## 2017-02-11 NOTE — Telephone Encounter (Signed)
Returned pt call. Pt stated that about 6 weeks ago he developed chest pain that radiated to his upper back between his shoulder blades. Pt stated that he has been f/u with PCP for that. Pt then stated that since chest pain started he has developed lower abd and side pain. Nurse inquired about pt performing CIC. Pt then stated he quit CIC about a month ago. Pt inquired about lower abd pain being from the bladder. Reinforced with pt to restart CIC as that will most likely relieve some of his pain. Reinforced with pt if he performs CIC and his pain does not go away to call back and will get him in for a PVR. Pt voiced understanding of whole conversation. Pt has a f/u on file for later in the month.

## 2017-02-15 ENCOUNTER — Other Ambulatory Visit: Payer: Self-pay | Admitting: Gastroenterology

## 2017-02-15 DIAGNOSIS — R1013 Epigastric pain: Secondary | ICD-10-CM

## 2017-02-18 ENCOUNTER — Ambulatory Visit
Admission: RE | Admit: 2017-02-18 | Discharge: 2017-02-18 | Disposition: A | Discharge status: Home / Self Care | Payer: BLUE CROSS/BLUE SHIELD | Source: Ambulatory Visit | Attending: Gastroenterology | Admitting: Gastroenterology

## 2017-02-18 DIAGNOSIS — K319 Disease of stomach and duodenum, unspecified: Secondary | ICD-10-CM | POA: Insufficient documentation

## 2017-02-18 DIAGNOSIS — R1013 Epigastric pain: Secondary | ICD-10-CM

## 2017-02-18 DIAGNOSIS — K449 Diaphragmatic hernia without obstruction or gangrene: Secondary | ICD-10-CM | POA: Diagnosis not present

## 2017-02-18 DIAGNOSIS — K219 Gastro-esophageal reflux disease without esophagitis: Secondary | ICD-10-CM | POA: Diagnosis not present

## 2017-02-26 ENCOUNTER — Encounter: Payer: Self-pay | Admitting: Urology

## 2017-02-26 ENCOUNTER — Ambulatory Visit (INDEPENDENT_AMBULATORY_CARE_PROVIDER_SITE_OTHER): Payer: BLUE CROSS/BLUE SHIELD | Admitting: Urology

## 2017-02-26 VITALS — BP 116/79 | HR 69 | Ht 68.0 in | Wt 276.4 lb

## 2017-02-26 DIAGNOSIS — N320 Bladder-neck obstruction: Secondary | ICD-10-CM

## 2017-02-26 NOTE — Progress Notes (Signed)
02/26/2017 8:57 AM   Kevin Pearson 07-29-1950 [id number]   HPI: 66 yo M with ischemic cardiomyopathy (EF 35%), CAD s/p PCI/ stent/ ICD, MI x multiple, afib on anticoagulation/ antiplatelet therapy with severe bladder neck contracture s/p emergent urethral dilation under MAC  in the setting of urinary retention on 06/25/16.  Review of medical records indicate that he had an in office laser vaporization of the prostate on 06/26/2015 by Dr. Fanny Skates.  It is unclear from the note what sort of laser was used, settings, etc.  He then developed a significant bladder neck contracture with plans for surgical management of this but unable to proceed secondary to local comorbidities and cardiac status.  He was previously on daily self catheter. Initially, his daughter was helping with this but no history of himself. More recently, he reports that he has had increasing issues with depression and motivation. He has not been cathetering as regular. He approximately once per week. At times, he has difficulty passing the catheter and other times he has no issues.  Today, he has no significant urinary complaints. No urinary frequency, urgency, or incontinence. His stream is good caliber. He gets up once at night to void.   He is taking Flomax.  He does have a  long-standing history of erectile dysfunction.   PMH: Past Medical History:  Diagnosis Date  . Acid reflux   . Anxiety   . Arthritis   . Atrial fibrillation ([place])   . CHF (congestive heart failure) ([place])   . Chronic orthostatic hypotension   . Clotting disorder ([place])   . COPD (chronic obstructive pulmonary disease) ([place])   . Depression   . Elevated PSA   . Heart attack ([place])   . Heart disease   . Heart failure ([place])   . Hepatitis   . High cholesterol   . Hypertension   . Sleep apnea   . TBI (traumatic brain injury) ([place])   . Urinary retention     Surgical History: Past Surgical History:  Procedure Laterality Date  .  BLADDER SURGERY    . cardiac stents    . CYSTOSCOPY WITH URETHRAL DILATATION N/A 06/25/2016   Procedure: CYSTOSCOPY WITH URETHRAL DILATATION;  Surgeon: Hollice Espy, MD;  Location: [facility] ORS;  Service: Urology;  Laterality: N/A;  . EP IMPLANTABLE DEVICE     St. Jude BiV-ICD  . GREEN LIGHT LASER TURP (TRANSURETHRAL RESECTION OF PROSTATE  2016   done in [facility]     Home Medications:  Allergies as of 02/26/2017      Reactions   Other Anaphylaxis, Itching   Mango skin   Codeine Nausea And Vomiting      Medication List       Accurate as of 02/26/17  8:57 AM. Always use your most recent med list.          acetaminophen 325 MG tablet Commonly known as:  TYLENOL Take 650 mg by mouth every 6 (six) hours as needed.   albuterol 108 (90 Base) MCG/ACT inhaler Commonly known as:  PROVENTIL HFA;VENTOLIN HFA Inhale 2 puffs into the lungs every 4 (four) hours as needed for shortness of breath.   budesonide 0.5 MG/2ML nebulizer solution Commonly known as:  PULMICORT Inhale 2 mLs into the lungs daily.   carvedilol 6.25 MG tablet Commonly known as:  COREG Take 6.25 mg by mouth 2 (two) times daily.   clonazePAM 1 MG tablet Commonly known as:  KLONOPIN Take 0.5 mg by mouth 3 (three) times daily.  clopidogrel 75 MG tablet Commonly known as:  PLAVIX Take 75 mg by mouth daily.   cyclobenzaprine 5 MG tablet Commonly known as:  FLEXERIL Take 1 tablet (5 mg total) by mouth 3 (three) times daily as needed.   divalproex 500 MG 24 hr tablet Commonly known as:  DEPAKOTE ER Take 500-750 mg by mouth 2 (two) times daily. Take 500 mg in the morning and 750 mg at night.   furosemide 40 MG tablet Commonly known as:  LASIX Take 40 mg by mouth 2 (two) times daily.   gabapentin 300 MG capsule Commonly known as:  NEURONTIN Take 600 mg by mouth 3 (three) times daily. Take 300 mg in the morning, 300 mg at 2 pm and 300 mg at night.   hydrALAZINE 25 MG tablet Commonly known as:  APRESOLINE Take 25  mg by mouth 3 (three) times daily.   Hyoscyamine Sulfate SL 0.125 MG Subl Commonly known as:  LEVSIN/SL Place 1 tablet under the tongue daily.   ipratropium-albuterol 0.5-2.5 (3) MG/3ML Soln Commonly known as:  DUONEB Take 3 mLs by nebulization every 6 (six) hours as needed (wheezing/shortness of breath).   Melatonin 1 MG Tabs Take 6 tablets by mouth at bedtime.   nitroGLYCERIN 0.4 MG SL tablet Commonly known as:  NITROSTAT Place 0.4 mg under the tongue every 5 (five) minutes as needed for chest pain.   omeprazole 40 MG capsule Commonly known as:  PRILOSEC Take 40 mg by mouth daily.   rosuvastatin 20 MG tablet Commonly known as:  CRESTOR Take 20 mg by mouth daily.   sacubitril-valsartan 24-26 MG Commonly known as:  ENTRESTO Take 1 tablet by mouth 2 (two) times daily.   tamsulosin 0.4 MG Caps capsule Commonly known as:  FLOMAX TAKE ONE CAPSULE BY MOUTH EVERY DAY AFTER SUPPER   traMADol 50 MG tablet Commonly known as:  ULTRAM Take 50 mg by mouth every 6 (six) hours as needed for moderate pain or severe pain.   traZODone 100 MG tablet Commonly known as:  DESYREL Take 100 mg by mouth at bedtime.   XARELTO 20 MG Tabs tablet Generic drug:  rivaroxaban Take 20 mg by mouth at bedtime.       Allergies:  Allergies  Allergen Reactions  . Other Anaphylaxis and Itching    Mango skin  . Codeine Nausea And Vomiting    Family History: Family History  Problem Relation Age of Onset  . Other Father        Cerebral hemorrhage  . Kidney cancer Neg Hx   . Kidney disease Neg Hx   . Prostate cancer Neg Hx     Social History:  reports that he quit smoking about 18 months ago. His smoking use included Cigarettes. He has never used smokeless tobacco. He reports that he drinks alcohol. He reports that he does not use drugs.  ROS: UROLOGY Frequent Urination?: Yes Hard to postpone urination?: No Burning/pain with urination?: No Get up at night to urinate?: Yes Leakage of  urine?: No Urine stream starts and stops?: No Trouble starting stream?: No Do you have to strain to urinate?: No Blood in urine?: No Urinary tract infection?: No Sexually transmitted disease?: No Injury to kidneys or bladder?: Yes Painful intercourse?: No Weak stream?: No Erection problems?: Yes Penile pain?: No  Gastrointestinal Nausea?: No Vomiting?: No Indigestion/heartburn?: No Diarrhea?: No Constipation?: No  Constitutional Fever: No Night sweats?: No Weight loss?: No Fatigue?: No  Skin Skin rash/lesions?: No Itching?: No  Eyes Blurred  vision?: No Double vision?: No  Ears/Nose/Throat Sore throat?: No Sinus problems?: Yes  Hematologic/Lymphatic Swollen glands?: No Easy bruising?: Yes  Cardiovascular Leg swelling?: Yes Chest pain?: No  Respiratory Cough?: No Shortness of breath?: Yes  Endocrine Excessive thirst?: Yes  Musculoskeletal Back pain?: Yes Joint pain?: Yes  Neurological Headaches?: No Dizziness?: No  Psychologic Depression?: Yes Anxiety?: Yes  Physical Exam: BP 116/79 (BP Location: Left Arm, Patient Position: Sitting, Cuff Size: Large)   Pulse 69   Ht _0  (1.727 m)   Wt 276 lb 6.4 oz (125.4 kg)   BMI 42.03 kg/m   Constitutional:  Alert and oriented, No acute distress.  Accompanied by daughter today.  HEENT: [place] AT, moist mucus membranes.  Trachea midline, no masses. Cardiovascular: Bilateral lower extremity edema, chronic with venous stasis changes Respiratory: Normal respiratory effort, no increased work of breathing.  Wearing O2. GI: Abdomen is soft, nontender, nondistended, no abdominal masses, obese..  GU: No CVA tenderness. Neurologic: Grossly intact, no focal deficits, moving all 4 extremities. Psychiatric: Normal mood and affect.  Laboratory Data: Lab Results  Component Value Date   WBC 13.5 (H) 10/06/2016   HGB 11.7 (L) 10/06/2016   HCT 36.6 (L) 10/06/2016   MCV 81.0 10/06/2016   PLT 246 10/06/2016    Lab  Results  Component Value Date   CREATININE 1.37 (H) 01/04/2017    Urinalysis N/a  Pertinent Imaging: n/a  Assessment & Plan:   1. Bladder neck contracture Severe pinpoint bladder neck contracture likely secondary to history of laser ablation of the prostate in 06/2016 Status post dilation under monitored anesthesia care in 06/2016 in the setting of retention and severe urinary symptoms Currently managed with daily CIC to keep his bladder neck patent, no urinary complaints otherwise Encouraged at least every other day catheter to avoid restenosis of contracture- lengthy discussion today about importance of compliance   Return in about 1 year (around 02/26/2018) for recheck symptoms.  Hollice Espy, MD  [facility] [address] [place], [place] [address] [phone]

## 2017-03-02 ENCOUNTER — Telehealth: Payer: Self-pay | Admitting: Urology

## 2017-03-02 NOTE — Telephone Encounter (Signed)
Pt's daughter, Charlynne Pander, called this morning.  Dr Apolinar Junes saw him last Friday.  He wasn't sure which medication she told him to stop taking.  I asked Dr Apolinar Junes and it was the Flomax.  Pt told her it wasn't working, so she told him to stop taking it.  The daughter says pt doesn't know what he's talking about and he should not stop taking it.  Please give her a call   [phone]  I asked was she with him at his appt and she said no.

## 2017-03-05 ENCOUNTER — Emergency Department: Payer: BLUE CROSS/BLUE SHIELD

## 2017-03-05 ENCOUNTER — Observation Stay
Admission: EM | Admit: 2017-03-05 | Discharge: 2017-03-06 | Disposition: A | Discharge status: Home / Self Care | Payer: BLUE CROSS/BLUE SHIELD | Attending: Internal Medicine | Admitting: Internal Medicine

## 2017-03-05 ENCOUNTER — Encounter: Payer: Self-pay | Admitting: Emergency Medicine

## 2017-03-05 DIAGNOSIS — R338 Other retention of urine: Secondary | ICD-10-CM | POA: Diagnosis not present

## 2017-03-05 DIAGNOSIS — E785 Hyperlipidemia, unspecified: Secondary | ICD-10-CM | POA: Diagnosis not present

## 2017-03-05 DIAGNOSIS — I252 Old myocardial infarction: Secondary | ICD-10-CM | POA: Insufficient documentation

## 2017-03-05 DIAGNOSIS — M1990 Unspecified osteoarthritis, unspecified site: Secondary | ICD-10-CM | POA: Diagnosis not present

## 2017-03-05 DIAGNOSIS — Z7901 Long term (current) use of anticoagulants: Secondary | ICD-10-CM | POA: Insufficient documentation

## 2017-03-05 DIAGNOSIS — G4733 Obstructive sleep apnea (adult) (pediatric): Secondary | ICD-10-CM | POA: Insufficient documentation

## 2017-03-05 DIAGNOSIS — I110 Hypertensive heart disease with heart failure: Secondary | ICD-10-CM | POA: Diagnosis not present

## 2017-03-05 DIAGNOSIS — K219 Gastro-esophageal reflux disease without esophagitis: Secondary | ICD-10-CM | POA: Diagnosis not present

## 2017-03-05 DIAGNOSIS — I5042 Chronic combined systolic (congestive) and diastolic (congestive) heart failure: Secondary | ICD-10-CM | POA: Insufficient documentation

## 2017-03-05 DIAGNOSIS — I4891 Unspecified atrial fibrillation: Secondary | ICD-10-CM | POA: Insufficient documentation

## 2017-03-05 DIAGNOSIS — Z888 Allergy status to other drugs, medicaments and biological substances status: Secondary | ICD-10-CM | POA: Diagnosis not present

## 2017-03-05 DIAGNOSIS — J441 Chronic obstructive pulmonary disease with (acute) exacerbation: Secondary | ICD-10-CM | POA: Diagnosis present

## 2017-03-05 DIAGNOSIS — N401 Enlarged prostate with lower urinary tract symptoms: Secondary | ICD-10-CM | POA: Diagnosis not present

## 2017-03-05 DIAGNOSIS — F419 Anxiety disorder, unspecified: Secondary | ICD-10-CM | POA: Insufficient documentation

## 2017-03-05 DIAGNOSIS — Z87891 Personal history of nicotine dependence: Secondary | ICD-10-CM | POA: Insufficient documentation

## 2017-03-05 DIAGNOSIS — R531 Weakness: Secondary | ICD-10-CM | POA: Insufficient documentation

## 2017-03-05 DIAGNOSIS — F329 Major depressive disorder, single episode, unspecified: Secondary | ICD-10-CM | POA: Diagnosis not present

## 2017-03-05 DIAGNOSIS — I5022 Chronic systolic (congestive) heart failure: Secondary | ICD-10-CM

## 2017-03-05 DIAGNOSIS — Z791 Long term (current) use of non-steroidal anti-inflammatories (NSAID): Secondary | ICD-10-CM | POA: Insufficient documentation

## 2017-03-05 DIAGNOSIS — Z7902 Long term (current) use of antithrombotics/antiplatelets: Secondary | ICD-10-CM | POA: Diagnosis not present

## 2017-03-05 DIAGNOSIS — I2510 Atherosclerotic heart disease of native coronary artery without angina pectoris: Secondary | ICD-10-CM | POA: Insufficient documentation

## 2017-03-05 LAB — CBC
HCT: 35.6 % — ABNORMAL LOW (ref 40.0–52.0)
Hemoglobin: 12.2 g/dL — ABNORMAL LOW (ref 13.0–18.0)
MCH: 29.6 pg (ref 26.0–34.0)
MCHC: 34.2 g/dL (ref 32.0–36.0)
MCV: 86.6 fL (ref 80.0–100.0)
Platelets: 189 10*3/uL (ref 150–440)
RBC: 4.1 MIL/uL — ABNORMAL LOW (ref 4.40–5.90)
RDW: 16.3 % — ABNORMAL HIGH (ref 11.5–14.5)
WBC: 7.6 10*3/uL (ref 3.8–10.6)

## 2017-03-05 LAB — BASIC METABOLIC PANEL
Anion gap: 9 (ref 5–15)
BUN: 26 mg/dL — ABNORMAL HIGH (ref 6–20)
CO2: 24 mmol/L (ref 22–32)
Calcium: 9.1 mg/dL (ref 8.9–10.3)
Chloride: 108 mmol/L (ref 101–111)
Creatinine, Ser: 1.25 mg/dL — ABNORMAL HIGH (ref 0.61–1.24)
GFR calc Af Amer: >60 mL/min (ref >60)
GFR calc non Af Amer: 59 mL/min — ABNORMAL LOW (ref >60)
Glucose, Bld: 168 mg/dL — ABNORMAL HIGH (ref 65–99)
Potassium: 3.7 mmol/L (ref 3.5–5.1)
Sodium: 141 mmol/L (ref 135–145)

## 2017-03-05 LAB — URINALYSIS, COMPLETE (UACMP) WITH MICROSCOPIC
Bacteria, UA: NONE SEEN
Bilirubin Urine: NEGATIVE
Glucose, UA: NEGATIVE mg/dL
Hgb urine dipstick: NEGATIVE
Ketones, ur: NEGATIVE mg/dL
Leukocytes, UA: NEGATIVE
Nitrite: NEGATIVE
Protein, ur: NEGATIVE mg/dL
Specific Gravity, Urine: 1.009 (ref 1.005–1.030)
pH: 6 (ref 5.0–8.0)

## 2017-03-05 LAB — TROPONIN I: Troponin I: <0.03 ng/mL (ref <0.03)

## 2017-03-05 LAB — BRAIN NATRIURETIC PEPTIDE: B Natriuretic Peptide: 266 pg/mL — ABNORMAL HIGH (ref 0.0–100.0)

## 2017-03-05 MED ORDER — IPRATROPIUM-ALBUTEROL 0.5-2.5 (3) MG/3ML IN SOLN
3.0000 mL | Freq: Once | RESPIRATORY_TRACT | Status: AC
  Administered 2017-03-05: 3 mL via RESPIRATORY_TRACT
  Filled 2017-03-05: qty 3

## 2017-03-05 MED ORDER — METHYLPREDNISOLONE SODIUM SUCC 125 MG IJ SOLR
60.0000 mg | Freq: Once | INTRAMUSCULAR | Status: AC
  Administered 2017-03-05: 60 mg via INTRAVENOUS
  Filled 2017-03-05: qty 2

## 2017-03-05 NOTE — ED Notes (Signed)
Pt ambulated with monitoring on, pt used crutch as a cane with this nurse and EDT at standby, pt o2sat decreased to 92% and RR increased to 35 while walking.  Pt appeared short of breath during activity.

## 2017-03-05 NOTE — ED Triage Notes (Signed)
[facility] complaining of increased weakness and shortness of breath with exertion. Patient denies any specific pain. Patient alert and oriented x4 upon arrival.

## 2017-03-05 NOTE — ED Provider Notes (Signed)
[facility] Emergency Department Provider Note    First MD Initiated Contact with Patient 03/05/17 2206     (approximate)  I have reviewed the triage vital signs and the nursing notes.   HISTORY  Chief Complaint Weakness    HPI Kevin Pearson is a 66 y.o. male sensor chief complaint of 3 days worsening shortness of breath and exertional dyspnea as well as worsening lower extremity swelling. Denies any fevers. No cough. No abdominal pain. No chest pain or pressure. Patient was significant history of both COPD as well as congestive heart failure with chronic oxygen requirement. States it is dyspnea and fatigue has become significant enough that he's having trouble walking even a few steps. As been compliant with all of his medications.  No recent antibiotics.   Past Medical History:  Diagnosis Date  . Acid reflux   . Anxiety   . Arthritis   . Atrial fibrillation (HCC)   . CHF (congestive heart failure) (HCC)   . Chronic orthostatic hypotension   . Clotting disorder (HCC)   . COPD (chronic obstructive pulmonary disease) (HCC)   . Depression   . Elevated PSA   . Heart attack (HCC)   . Heart disease   . Heart failure (HCC)   . Hepatitis   . High cholesterol   . Hypertension   . Sleep apnea   . TBI (traumatic brain injury) (HCC)   . Urinary retention    Family History  Problem Relation Age of Onset  . Other Father        Cerebral hemorrhage  . Kidney cancer Neg Hx   . Kidney disease Neg Hx   . Prostate cancer Neg Hx    Past Surgical History:  Procedure Laterality Date  . BLADDER SURGERY    . cardiac stents    . CYSTOSCOPY WITH URETHRAL DILATATION N/A 06/25/2016   Procedure: CYSTOSCOPY WITH URETHRAL DILATATION;  Surgeon: Vanna Scotland, MD;  Location: [facility] ORS;  Service: Urology;  Laterality: N/A;  . EP IMPLANTABLE DEVICE     St. Jude BiV-ICD  . GREEN LIGHT LASER TURP (TRANSURETHRAL RESECTION OF PROSTATE  2016   done in [facility]    Patient  Active Problem List   Diagnosis Date Noted  . Hypotension 10/29/2016  . Obstructive sleep apnea 10/29/2016  . Chronic bilateral low back pain 08/27/2016  . H/O hypotension 07/29/2016  . Chronic obstructive pulmonary disease (HCC) 07/16/2016  . Chronic systolic congestive heart failure (HCC) 07/16/2016  . Peripheral neuropathy 07/16/2016  . Anxiety and depression 07/16/2016  . Arthralgia of right foot 07/16/2016  . Urethral stricture 07/16/2016  . Urinary retention 06/24/2016      Prior to Admission medications   Medication Sig Start Date End Date Taking? Authorizing Provider  acetaminophen (TYLENOL) 325 MG tablet Take 650 mg by mouth every 6 (six) hours as needed.    [provider]  albuterol (PROVENTIL HFA;VENTOLIN HFA) 108 (90 Base) MCG/ACT inhaler Inhale 2 puffs into the lungs every 4 (four) hours as needed for shortness of breath.    [provider]  budesonide (PULMICORT) 0.5 MG/2ML nebulizer solution Inhale 2 mLs into the lungs daily. 09/26/16   [provider]  carvedilol (COREG) 6.25 MG tablet Take 6.25 mg by mouth 2 (two) times daily.    [provider]  clonazePAM (KLONOPIN) 1 MG tablet Take 0.5 mg by mouth 3 (three) times daily.     [provider]  clopidogrel (PLAVIX) 75 MG tablet Take 75  mg by mouth daily.    [provider]  cyclobenzaprine (FLEXERIL) 5 MG tablet Take 1 tablet (5 mg total) by mouth 3 (three) times daily as needed. Patient not taking: Reported on 02/26/2017 12/14/16   Little, Traci M, PA-C  divalproex (DEPAKOTE ER) 500 MG 24 hr tablet Take 500-750 mg by mouth 2 (two) times daily. Take 500 mg in the morning and 750 mg at night.    [provider]  furosemide (LASIX) 40 MG tablet Take 40 mg by mouth 2 (two) times daily.    [provider]  gabapentin (NEURONTIN) 300 MG capsule Take 600 mg by mouth 3 (three) times daily. Take 300 mg in the morning, 300 mg at 2 pm and 300 mg at night.     [provider]  hydrALAZINE (APRESOLINE) 25 MG tablet Take 25 mg by mouth 3 (three) times daily.    [provider]  Hyoscyamine Sulfate SL (LEVSIN/SL) 0.125 MG SUBL Place 1 tablet under the tongue daily. 07/07/16   McGowan, Carollee HerterShannon A, PA-C  ipratropium-albuterol (DUONEB) 0.5-2.5 (3) MG/3ML SOLN Take 3 mLs by nebulization every 6 (six) hours as needed (wheezing/shortness of breath).    [provider]  Melatonin 1 MG TABS Take 6 tablets by mouth at bedtime.     [provider]  nitroGLYCERIN (NITROSTAT) 0.4 MG SL tablet Place 0.4 mg under the tongue every 5 (five) minutes as needed for chest pain.    [provider]  omeprazole (PRILOSEC) 40 MG capsule Take 40 mg by mouth daily.    [provider]  rivaroxaban (XARELTO) 20 MG TABS tablet Take 20 mg by mouth at bedtime.    [provider]  rosuvastatin (CRESTOR) 20 MG tablet Take 20 mg by mouth daily.    [provider]  sacubitril-valsartan (ENTRESTO) 24-26 MG Take 1 tablet by mouth 2 (two) times daily. 01/21/17   Delma FreezeHackney, Tina A, FNP  tamsulosin (FLOMAX) 0.4 MG CAPS capsule TAKE ONE CAPSULE BY MOUTH EVERY DAY AFTER SUPPER 11/23/16   Vanna ScotlandBrandon, Ashley, MD  traMADol (ULTRAM) 50 MG tablet Take 50 mg by mouth every 6 (six) hours as needed for moderate pain or severe pain.     [provider]  traZODone (DESYREL) 100 MG tablet Take 100 mg by mouth at bedtime. 09/23/16   [provider]    Allergies Other and Codeine    Social History Social History  Substance Use Topics  . Smoking status: Former Smoker    Types: Cigarettes    Quit date: 08/2015  . Smokeless tobacco: Never Used  . Alcohol use Yes    Review of Systems Patient denies headaches, rhinorrhea, blurry vision, numbness, shortness of breath, chest pain, edema, cough, abdominal pain, nausea, vomiting, diarrhea, dysuria, fevers, rashes or hallucinations unless otherwise stated above in  HPI. ____________________________________________   PHYSICAL EXAM:  VITAL SIGNS: Vitals:   03/05/17 2149 03/05/17 2304  BP: 93/63 91/72  Pulse: 70 71  Resp: 14 (!) 21  SpO2: 99% 96%    Constitutional: Alert and oriented. Chronically ill appearing and in no acute distress. Eyes: Conjunctivae are normal.  Head: Atraumatic. Nose: No congestion/rhinnorhea. Mouth/Throat: Mucous membranes are moist.   Neck: No stridor. Painless ROM.  Cardiovascular: Normal rate, regular rhythm. Grossly normal heart sounds.  Good peripheral circulation. Respiratory: mild tachypnea, speaking in short phrases, diminished breathsounds in bilateral posterior bases. Gastrointestinal: Soft and nontender. No distention. No abdominal bruits. No CVA tenderness. Musculoskeletal:+ bilateral lower extremity tenderness nor edema.  No joint effusions. Neurologic:  Normal speech and language. No gross focal neurologic deficits are appreciated. No facial droop Skin:  Skin is warm, dry and intact. No rash noted. Psychiatric: Mood and affect are normal. Speech and behavior are normal.  ____________________________________________   LABS (all labs ordered are listed, but only abnormal results are displayed)  Results for orders placed or performed during the hospital encounter of 03/05/17 (from the past 24 hour(s))  Basic metabolic panel     Status: Abnormal   Collection Time: 03/05/17  9:55 PM  Result Value Ref Range   Sodium 141 135 - 145 mmol/L   Potassium 3.7 3.5 - 5.1 mmol/L   Chloride 108 101 - 111 mmol/L   CO2 24 22 - 32 mmol/L   Glucose, Bld 168 (H) 65 - 99 mg/dL   BUN 26 (H) 6 - 20 mg/dL   Creatinine, Ser 9.60 (H) 0.61 - 1.24 mg/dL   Calcium 9.1 8.9 - 45.4 mg/dL   GFR calc non Af Amer 59 (L) >60 mL/min   GFR calc Af Amer >60 >60 mL/min   Anion gap 9 5 - 15  CBC     Status: Abnormal   Collection Time: 03/05/17  9:55 PM  Result Value Ref Range   WBC 7.6 3.8 - 10.6 K/uL   RBC 4.10 (L) 4.40 - 5.90  MIL/uL   Hemoglobin 12.2 (L) 13.0 - 18.0 g/dL   HCT 09.8 (L) 11.9 - 14.7 %   MCV 86.6 80.0 - 100.0 fL   MCH 29.6 26.0 - 34.0 pg   MCHC 34.2 32.0 - 36.0 g/dL   RDW 82.9 (H) 56.2 - 13.0 %   Platelets 189 150 - 440 K/uL  Brain natriuretic peptide     Status: Abnormal   Collection Time: 03/05/17  9:55 PM  Result Value Ref Range   B Natriuretic Peptide 266.0 (H) 0.0 - 100.0 pg/mL  Troponin I     Status: None   Collection Time: 03/05/17  9:55 PM  Result Value Ref Range   Troponin I <0.03 <0.03 ng/mL  Urinalysis, Complete w Microscopic     Status: Abnormal   Collection Time: 03/05/17 10:22 PM  Result Value Ref Range   Color, Urine STRAW (A) YELLOW   APPearance CLEAR (A) CLEAR   Specific Gravity, Urine 1.009 1.005 - 1.030   pH 6.0 5.0 - 8.0   Glucose, UA NEGATIVE NEGATIVE mg/dL   Hgb urine dipstick NEGATIVE NEGATIVE   Bilirubin Urine NEGATIVE NEGATIVE   Ketones, ur NEGATIVE NEGATIVE mg/dL   Protein, ur NEGATIVE NEGATIVE mg/dL   Nitrite NEGATIVE NEGATIVE   Leukocytes, UA NEGATIVE NEGATIVE   RBC / HPF 0-5 0 - 5 RBC/hpf   WBC, UA 0-5 0 - 5 WBC/hpf   Bacteria, UA NONE SEEN NONE SEEN   Squamous Epithelial / LPF 0-5 (A) NONE SEEN   Mucus PRESENT    Hyaline Casts, UA PRESENT    ____________________________________________  EKG My review and personal interpretation at Time: 21:54   Indication: sob  Rate: 70  Rhythm: V-paced Axis: normal Other: no sgarbossa ____________________________________________  RADIOLOGY  I personally reviewed all radiographic images ordered to evaluate for the above acute complaints and reviewed radiology reports and findings.  These findings were personally discussed with the patient.  Please see medical record for radiology report.  ____________________________________________   PROCEDURES  Procedure(s) performed:  Procedures    Critical Care performed: no ____________________________________________   INITIAL IMPRESSION / ASSESSMENT AND PLAN  / ED  COURSE  Pertinent labs & imaging results that were available during my care of the patient were reviewed by me and considered in my medical decision making (see chart for details).  DDX: Asthma, copd, CHF, pna, ptx, malignancy, Pe, anemia   Kevin Pearson is a 66 y.o. who presents to the ED with weakness and shortness of breath as described above. Does not have any evidence of EKG changes as compared to previous. Troponin is negative. Chest x-ray shows stable cardiomegaly with no evidence of edema but does show medial swelling suggestive of bronchitis versus COPD. No evidence of consolidation or pneumonia. I do feel that his presentation is more secondary to COPD exacerbation and do feel that as he has received multiple nebulizer treatments as well as IV steroids and magnesium that he will require admission to the hospital for further evaluation and management.      ____________________________________________   FINAL CLINICAL IMPRESSION(S) / ED DIAGNOSES  Final diagnoses:  Weakness  COPD exacerbation (HCC)      NEW MEDICATIONS STARTED DURING THIS VISIT:  New Prescriptions   No medications on file     Note:  This document was prepared using Dragon voice recognition software and may include unintentional dictation errors.    Willy Eddy, MD 03/05/17 [phone]

## 2017-03-05 NOTE — Telephone Encounter (Signed)
Spoke with pt daughter, Charlynne PanderCara, in reference to pt taking tamsulosin. Charlynne PanderCara stated that she feels like pt should stay on medication. Reinforced with Charlynne PanderCara if pt is agreeable that would be ok. Charlynne PanderCara voiced understanding of whole conversation.

## 2017-03-06 DIAGNOSIS — J441 Chronic obstructive pulmonary disease with (acute) exacerbation: Secondary | ICD-10-CM | POA: Diagnosis present

## 2017-03-06 LAB — BASIC METABOLIC PANEL
Anion gap: 6 (ref 5–15)
BUN: 25 mg/dL — ABNORMAL HIGH (ref 6–20)
CO2: 27 mmol/L (ref 22–32)
Calcium: 9.5 mg/dL (ref 8.9–10.3)
Chloride: 111 mmol/L (ref 101–111)
Creatinine, Ser: 1.36 mg/dL — ABNORMAL HIGH (ref 0.61–1.24)
GFR calc Af Amer: >60 mL/min (ref >60)
GFR calc non Af Amer: 53 mL/min — ABNORMAL LOW (ref >60)
Glucose, Bld: 179 mg/dL — ABNORMAL HIGH (ref 65–99)
Potassium: 3.6 mmol/L (ref 3.5–5.1)
Sodium: 144 mmol/L (ref 135–145)

## 2017-03-06 LAB — CBC
HCT: 39.5 % — ABNORMAL LOW (ref 40.0–52.0)
Hemoglobin: 13.2 g/dL (ref 13.0–18.0)
MCH: 29.9 pg (ref 26.0–34.0)
MCHC: 33.3 g/dL (ref 32.0–36.0)
MCV: 89.6 fL (ref 80.0–100.0)
Platelets: 186 10*3/uL (ref 150–440)
RBC: 4.41 MIL/uL (ref 4.40–5.90)
RDW: 17.1 % — ABNORMAL HIGH (ref 11.5–14.5)
WBC: 5.1 10*3/uL (ref 3.8–10.6)

## 2017-03-06 LAB — MAGNESIUM: Magnesium: 1.7 mg/dL (ref 1.7–2.4)

## 2017-03-06 LAB — PHOSPHORUS: Phosphorus: <1 mg/dL — CL (ref 2.5–4.6)

## 2017-03-06 MED ORDER — BISACODYL 5 MG PO TBEC: 5.0000 mg | DELAYED_RELEASE_TABLET | Freq: Every day | ORAL | Status: DC | PRN

## 2017-03-06 MED ORDER — MELATONIN 5 MG PO TABS
5.0000 mg | ORAL_TABLET | Freq: Every day | ORAL | Status: DC
  Filled 2017-03-06: qty 1

## 2017-03-06 MED ORDER — TRAZODONE HCL 50 MG PO TABS
100.0000 mg | ORAL_TABLET | Freq: Every day | ORAL | Status: DC
Start: 2017-03-06 — End: 2017-03-06

## 2017-03-06 MED ORDER — HYOSCYAMINE SULFATE 0.125 MG SL SUBL
0.1250 mg | SUBLINGUAL_TABLET | Freq: Every day | SUBLINGUAL | Status: DC
  Administered 2017-03-06: 0.125 mg via SUBLINGUAL
  Filled 2017-03-06: qty 1

## 2017-03-06 MED ORDER — FUROSEMIDE 10 MG/ML IJ SOLN
40.0000 mg | INTRAMUSCULAR | Status: AC
  Administered 2017-03-06: 40 mg via INTRAVENOUS
  Filled 2017-03-06: qty 4

## 2017-03-06 MED ORDER — BUDESONIDE 0.5 MG/2ML IN SUSP
2.0000 mL | Freq: Every day | RESPIRATORY_TRACT | Status: DC
  Administered 2017-03-06: 0.5 mg via RESPIRATORY_TRACT
  Filled 2017-03-06: qty 2

## 2017-03-06 MED ORDER — ROSUVASTATIN CALCIUM 20 MG PO TABS
20.0000 mg | ORAL_TABLET | Freq: Every day | ORAL | Status: DC
  Administered 2017-03-06: 10:00:00 20 mg via ORAL
  Filled 2017-03-06: qty 1

## 2017-03-06 MED ORDER — SENNOSIDES-DOCUSATE SODIUM 8.6-50 MG PO TABS: 1.0000 | ORAL_TABLET | Freq: Every evening | ORAL | Status: DC | PRN

## 2017-03-06 MED ORDER — TAMSULOSIN HCL 0.4 MG PO CAPS: 0.4000 mg | ORAL_CAPSULE | Freq: Every day | ORAL | Status: DC

## 2017-03-06 MED ORDER — SODIUM CHLORIDE 0.9% FLUSH
3.0000 mL | Freq: Two times a day (BID) | INTRAVENOUS | Status: DC
  Administered 2017-03-06 (×2): 3 mL via INTRAVENOUS

## 2017-03-06 MED ORDER — SODIUM CHLORIDE 0.9 % IV SOLN: 250.0000 mL | INTRAVENOUS | Status: DC | PRN

## 2017-03-06 MED ORDER — GABAPENTIN 300 MG PO CAPS
600.0000 mg | ORAL_CAPSULE | Freq: Three times a day (TID) | ORAL | Status: DC
  Administered 2017-03-06: 10:00:00 600 mg via ORAL
  Filled 2017-03-06: qty 2

## 2017-03-06 MED ORDER — NITROGLYCERIN 0.4 MG SL SUBL: 0.4000 mg | SUBLINGUAL_TABLET | SUBLINGUAL | Status: DC | PRN

## 2017-03-06 MED ORDER — FUROSEMIDE 40 MG PO TABS
40.0000 mg | ORAL_TABLET | Freq: Two times a day (BID) | ORAL | Status: DC
  Administered 2017-03-06: 40 mg via ORAL
  Filled 2017-03-06: qty 1

## 2017-03-06 MED ORDER — DEXTROSE 5 % IV SOLN
500.0000 mg | INTRAVENOUS | Status: DC
  Administered 2017-03-06: 500 mg via INTRAVENOUS
  Filled 2017-03-06: qty 500

## 2017-03-06 MED ORDER — SACUBITRIL-VALSARTAN 24-26 MG PO TABS
1.0000 | ORAL_TABLET | Freq: Two times a day (BID) | ORAL | Status: DC
  Administered 2017-03-06: 10:00:00 1 via ORAL
  Filled 2017-03-06 (×2): qty 1

## 2017-03-06 MED ORDER — DIVALPROEX SODIUM ER 500 MG PO TB24
500.0000 mg | ORAL_TABLET | Freq: Every day | ORAL | Status: DC
  Administered 2017-03-06: 10:00:00 500 mg via ORAL
  Filled 2017-03-06: qty 1

## 2017-03-06 MED ORDER — ONDANSETRON HCL 4 MG PO TABS: 4.0000 mg | ORAL_TABLET | Freq: Four times a day (QID) | ORAL | Status: DC | PRN

## 2017-03-06 MED ORDER — HYDRALAZINE HCL 25 MG PO TABS
25.0000 mg | ORAL_TABLET | Freq: Three times a day (TID) | ORAL | Status: DC
  Administered 2017-03-06: 10:00:00 25 mg via ORAL
  Filled 2017-03-06 (×3): qty 1

## 2017-03-06 MED ORDER — TRAZODONE HCL 50 MG PO TABS: 50.0000 mg | ORAL_TABLET | Freq: Every day | ORAL | 0 refills | Status: DC

## 2017-03-06 MED ORDER — BUDESONIDE 0.5 MG/2ML IN SUSP: 2.0000 mL | Freq: Every day | RESPIRATORY_TRACT | Status: DC

## 2017-03-06 MED ORDER — ACETAMINOPHEN 650 MG RE SUPP: 650.0000 mg | Freq: Four times a day (QID) | RECTAL | Status: DC | PRN

## 2017-03-06 MED ORDER — DEXTROSE 5 % IV SOLN: 500.0000 mg | INTRAVENOUS | Status: DC

## 2017-03-06 MED ORDER — CARVEDILOL 3.125 MG PO TABS
6.2500 mg | ORAL_TABLET | Freq: Two times a day (BID) | ORAL | Status: DC
  Administered 2017-03-06: 6.25 mg via ORAL
  Filled 2017-03-06: qty 2

## 2017-03-06 MED ORDER — SODIUM CHLORIDE 0.9% FLUSH: 3.0000 mL | INTRAVENOUS | Status: DC | PRN

## 2017-03-06 MED ORDER — RIVAROXABAN 20 MG PO TABS
20.0000 mg | ORAL_TABLET | Freq: Every day | ORAL | Status: DC
  Filled 2017-03-06: qty 1

## 2017-03-06 MED ORDER — CLONAZEPAM 0.5 MG PO TABS
0.5000 mg | ORAL_TABLET | Freq: Three times a day (TID) | ORAL | Status: DC
  Administered 2017-03-06: 0.5 mg via ORAL
  Filled 2017-03-06: qty 1

## 2017-03-06 MED ORDER — ACETAMINOPHEN 325 MG PO TABS: 650.0000 mg | ORAL_TABLET | Freq: Four times a day (QID) | ORAL | Status: DC | PRN

## 2017-03-06 MED ORDER — DIVALPROEX SODIUM ER 500 MG PO TB24
750.0000 mg | ORAL_TABLET | Freq: Every day | ORAL | Status: DC
  Filled 2017-03-06: qty 1

## 2017-03-06 MED ORDER — IPRATROPIUM BROMIDE 0.02 % IN SOLN: 0.5000 mg | Freq: Four times a day (QID) | RESPIRATORY_TRACT | Status: DC | PRN

## 2017-03-06 MED ORDER — CLOPIDOGREL BISULFATE 75 MG PO TABS
75.0000 mg | ORAL_TABLET | Freq: Every day | ORAL | Status: DC
  Administered 2017-03-06: 10:00:00 75 mg via ORAL
  Filled 2017-03-06: qty 1

## 2017-03-06 MED ORDER — METHYLPREDNISOLONE SODIUM SUCC 125 MG IJ SOLR
60.0000 mg | Freq: Four times a day (QID) | INTRAMUSCULAR | Status: DC
  Administered 2017-03-06: 60 mg via INTRAVENOUS
  Filled 2017-03-06: qty 2

## 2017-03-06 MED ORDER — TRAMADOL HCL 50 MG PO TABS: 50.0000 mg | ORAL_TABLET | Freq: Four times a day (QID) | ORAL | Status: DC | PRN

## 2017-03-06 MED ORDER — PANTOPRAZOLE SODIUM 40 MG PO TBEC
40.0000 mg | DELAYED_RELEASE_TABLET | Freq: Every day | ORAL | Status: DC
  Administered 2017-03-06: 40 mg via ORAL
  Filled 2017-03-06: qty 1

## 2017-03-06 MED ORDER — DIVALPROEX SODIUM ER 500 MG PO TB24: 500.0000 mg | ORAL_TABLET | Freq: Two times a day (BID) | ORAL | Status: DC

## 2017-03-06 MED ORDER — MAGNESIUM CITRATE PO SOLN
1.0000 | Freq: Once | ORAL | Status: DC | PRN
  Filled 2017-03-06: qty 296

## 2017-03-06 MED ORDER — ALBUTEROL SULFATE (2.5 MG/3ML) 0.083% IN NEBU: 2.5000 mg | INHALATION_SOLUTION | Freq: Four times a day (QID) | RESPIRATORY_TRACT | Status: DC | PRN

## 2017-03-06 MED ORDER — ONDANSETRON HCL 4 MG/2ML IJ SOLN: 4.0000 mg | Freq: Four times a day (QID) | INTRAMUSCULAR | Status: DC | PRN

## 2017-03-06 NOTE — H&P (Addendum)
History and Physical   [facility] - [place] @ [facility] Admission History and Physical [employer], D.O.    Patient Name: Kevin Pearson MR#: [id number] Date of Birth: October 16, 1950 Date of Admission: 03/05/2017  Referring MD/NP/PA: Dr. Roxan Hockey Primary Care Physician: Sharilyn Sites, MD  Chief Complaint:  Chief Complaint  Patient presents with  . Weakness  Please note the entire history is obtained from the patient's emergency department chart, emergency department provider. Patient's personal history is limited by somnolence, lethargy.   HPI: Kevin Pearson is a 66 y.o. male with a known history of COPD, CHF, afib, CAD s/p MI, HTN, HLD, OSA, TBI presents to the emergency department for evaluation of weakness and SOB.  Per ED records, patient was in a usual state of health until three days ago when he developed progressively worsening shortness of breath and bilateral lower extrmity edema.  .  EMS/ED Course: Patient received Duoneb and Solumedrol. Medical admission has been requested for further management of COPD exacerbation.  Review of Systems:  Unable to obtain 2/2 somnolence, lethargy.   Past Medical History:  Diagnosis Date  . Acid reflux   . Anxiety   . Arthritis   . Atrial fibrillation (HCC)   . CHF (congestive heart failure) (HCC)   . Chronic orthostatic hypotension   . Clotting disorder (HCC)   . COPD (chronic obstructive pulmonary disease) (HCC)   . Depression   . Elevated PSA   . Heart attack (HCC)   . Heart disease   . Heart failure (HCC)   . Hepatitis   . High cholesterol   . Hypertension   . Sleep apnea   . TBI (traumatic brain injury) (HCC)   . Urinary retention     Past Surgical History:  Procedure Laterality Date  . BLADDER SURGERY    . cardiac stents    . CYSTOSCOPY WITH URETHRAL DILATATION N/A 06/25/2016   Procedure: CYSTOSCOPY WITH URETHRAL DILATATION;  Surgeon: Vanna Scotland, MD;  Location: [facility] ORS;  Service: Urology;  Laterality:  N/A;  . EP IMPLANTABLE DEVICE     St. Jude BiV-ICD  . GREEN LIGHT LASER TURP (TRANSURETHRAL RESECTION OF PROSTATE  2016   done in [place]      reports that he quit smoking about 18 months ago. His smoking use included Cigarettes. He has never used smokeless tobacco. He reports that he drinks alcohol. He reports that he does not use drugs.  Allergies  Allergen Reactions  . Other Anaphylaxis and Itching    Mango skin  . Codeine Nausea And Vomiting    Family History  Problem Relation Age of Onset  . Other Father        Cerebral hemorrhage  . Kidney cancer Neg Hx   . Kidney disease Neg Hx   . Prostate cancer Neg Hx     Prior to Admission medications   Medication Sig Start Date End Date Taking? Authorizing Provider  acetaminophen (TYLENOL) 325 MG tablet Take 650 mg by mouth every 6 (six) hours as needed.    [provider]  albuterol (PROVENTIL HFA;VENTOLIN HFA) 108 (90 Base) MCG/ACT inhaler Inhale 2 puffs into the lungs every 4 (four) hours as needed for shortness of breath.    [provider]  budesonide (PULMICORT) 0.5 MG/2ML nebulizer solution Inhale 2 mLs into the lungs daily. 09/26/16   [provider]  carvedilol (COREG) 6.25 MG tablet Take 6.25 mg by mouth 2 (two) times daily.    [provider]  clonazePAM Scarlette Calico)  1 MG tablet Take 0.5 mg by mouth 3 (three) times daily.     [provider]  clopidogrel (PLAVIX) 75 MG tablet Take 75 mg by mouth daily.    [provider]  cyclobenzaprine (FLEXERIL) 5 MG tablet Take 1 tablet (5 mg total) by mouth 3 (three) times daily as needed. Patient not taking: Reported on 02/26/2017 12/14/16   Little, Traci M, PA-C  divalproex (DEPAKOTE ER) 500 MG 24 hr tablet Take 500-750 mg by mouth 2 (two) times daily. Take 500 mg in the morning and 750 mg at night.    [provider]  furosemide (LASIX) 40 MG tablet Take 40 mg by mouth 2 (two) times daily.    [provider]  gabapentin  (NEURONTIN) 300 MG capsule Take 600 mg by mouth 3 (three) times daily. Take 300 mg in the morning, 300 mg at 2 pm and 300 mg at night.    [provider]  hydrALAZINE (APRESOLINE) 25 MG tablet Take 25 mg by mouth 3 (three) times daily.    [provider]  Hyoscyamine Sulfate SL (LEVSIN/SL) 0.125 MG SUBL Place 1 tablet under the tongue daily. 07/07/16   McGowan, Carollee Herter A, PA-C  ipratropium-albuterol (DUONEB) 0.5-2.5 (3) MG/3ML SOLN Take 3 mLs by nebulization every 6 (six) hours as needed (wheezing/shortness of breath).    [provider]  Melatonin 1 MG TABS Take 6 tablets by mouth at bedtime.     [provider]  nitroGLYCERIN (NITROSTAT) 0.4 MG SL tablet Place 0.4 mg under the tongue every 5 (five) minutes as needed for chest pain.    [provider]  omeprazole (PRILOSEC) 40 MG capsule Take 40 mg by mouth daily.    [provider]  rivaroxaban (XARELTO) 20 MG TABS tablet Take 20 mg by mouth at bedtime.    [provider]  rosuvastatin (CRESTOR) 20 MG tablet Take 20 mg by mouth daily.    [provider]  sacubitril-valsartan (ENTRESTO) 24-26 MG Take 1 tablet by mouth 2 (two) times daily. 01/21/17   Delma Freeze, FNP  tamsulosin (FLOMAX) 0.4 MG CAPS capsule TAKE ONE CAPSULE BY MOUTH EVERY DAY AFTER SUPPER 11/23/16   Vanna Scotland, MD  traMADol (ULTRAM) 50 MG tablet Take 50 mg by mouth every 6 (six) hours as needed for moderate pain or severe pain.     [provider]  traZODone (DESYREL) 100 MG tablet Take 100 mg by mouth at bedtime. 09/23/16   [provider]    Physical Exam: Vitals:   03/05/17 2319 03/05/17 2323 03/05/17 2330 03/06/17 0002  BP:   99/67 90/77  Pulse: 76  70 70  Resp: (!) 35  (!) 37 17  Temp:  (!) 97.2 F (36.2 C)    TempSrc:  Oral    SpO2: 92%  97% 96%  Weight:      Height:        GENERAL: 66 y.o.-year-old male patient, lying in the bed in no acute distress.  Sleeping, snoring.   Arouses but falls back to sleep almost immediately.  HEENT: Head atraumatic, normocephalic. Pupils equal. Mucus membranes moist. NECK: Supple. CHEST: Decreased breath sounds at the bases, mild diffuse wheeze.  No use of accessory muscles of respiration.  No reproducible chest wall tenderness.  CARDIOVASCULAR: S1, S2 normal. No murmurs, rubs, or gallops. Cap refill <2 seconds. Pulses intact distally.  ABDOMEN: Soft, nondistended, nontender. No rebound, guarding, rigidity. Normoactive bowel sounds present in all four quadrants.  EXTREMITIES:  No pedal edema, cyanosis, or clubbing. No calf tenderness or Homan's sign.  NEUROLOGIC: The patient arouses but does not stay awake long enough to perform neuro exam.  SKIN: Warm, dry, and intact without obvious rash, lesion, or ulcer.    Labs on Admission:  CBC:  Recent Labs Lab 03/05/17 2155  WBC 7.6  HGB 12.2*  HCT 35.6*  MCV 86.6  PLT 189   Basic Metabolic Panel:  Recent Labs Lab 03/05/17 2155  NA 141  K 3.7  CL 108  CO2 24  GLUCOSE 168*  BUN 26*  CREATININE 1.25*  CALCIUM 9.1   GFR: Estimated Creatinine Clearance: 76.8 mL/min (A) (by C-G formula based on SCr of 1.25 mg/dL (H)). Liver Function Tests: No results for input(s): AST, ALT, ALKPHOS, BILITOT, PROT, ALBUMIN in the last 168 hours. No results for input(s): LIPASE, AMYLASE in the last 168 hours. No results for input(s): AMMONIA in the last 168 hours. Coagulation Profile: No results for input(s): INR, PROTIME in the last 168 hours. Cardiac Enzymes:  Recent Labs Lab 03/05/17 2155  TROPONINI <0.03   BNP (last 3 results) No results for input(s): PROBNP in the last 8760 hours. HbA1C: No results for input(s): HGBA1C in the last 72 hours. CBG: No results for input(s): GLUCAP in the last 168 hours. Lipid Profile: No results for input(s): CHOL, HDL, LDLCALC, TRIG, CHOLHDL, LDLDIRECT in the last 72 hours. Thyroid Function Tests: No results for input(s): TSH, T4TOTAL,  FREET4, T3FREE, THYROIDAB in the last 72 hours. Anemia Panel: No results for input(s): VITAMINB12, FOLATE, FERRITIN, TIBC, IRON, RETICCTPCT in the last 72 hours. Urine analysis:    Component Value Date/Time   COLORURINE STRAW (A) 03/05/2017 2222   APPEARANCEUR CLEAR (A) 03/05/2017 2222   APPEARANCEUR Cloudy (A) 10/26/2016 1139   LABSPEC 1.009 03/05/2017 2222   PHURINE 6.0 03/05/2017 2222   GLUCOSEU NEGATIVE 03/05/2017 2222   HGBUR NEGATIVE 03/05/2017 2222   BILIRUBINUR NEGATIVE 03/05/2017 2222   BILIRUBINUR Negative 10/26/2016 1139   KETONESUR NEGATIVE 03/05/2017 2222   PROTEINUR NEGATIVE 03/05/2017 2222   NITRITE NEGATIVE 03/05/2017 2222   LEUKOCYTESUR NEGATIVE 03/05/2017 2222   LEUKOCYTESUR 2+ (A) 10/26/2016 1139   Sepsis Labs: @LABRCNTIP (procalcitonin:4,lacticidven:4) )No results found for this or any previous visit (from the past 240 hour(s)).   Radiological Exams on Admission: Dg Chest 2 View  Result Date: 03/05/2017 CLINICAL DATA:  Weakness and shortness of breath EXAM: CHEST  2 VIEW COMPARISON:  12/23/2016 FINDINGS: Left-sided pacing device as before. Coarse interstitial opacity. No pleural effusion. No focal consolidation. Normal heart size. Aortic atherosclerosis. No pneumothorax. IMPRESSION: Course perihilar interstitial opacity could reflect mild central airways inflammation. No focal infiltrate, edema or pleural effusion. Electronically Signed   By: Jasmine PangKim  Fujinaga M.D.   On: 03/05/2017 22:39    EKG: Paced at 70 bpm with normal axis and nonspecific ST-T wave changes.   Assessment/Plan  This is a 66 y.o. male with a history of COPD, CHF, afib, CAD s/p MI, HTN, HLD, OSA, TBI  now being admitted with:  #. Acute exacerbation of COPD - Admit observation, continuous pulse ox - IV steroids and azithromycin - Continue Pulmicort - Nebulizers, O2 therapy and expectorants as needed.  - Continuous pulse oximetry - Consider pulmonary consult if not improving.   #. H/o  HTN - Continue Entresto, Coreg, hydralazine  #. H/O BPH - Continue Flomax  #. History of CAD - Continue Plavix, nitro  #. History of GERD - Continue Protonix for Pepcid  #. History  of afib - Continue Xarelto, Coreg  #. History of HLD - Continue Crestor  Admission status: Observation IV Fluids: HL Diet/Nutrition: HH Consults called: None  DVT ZO:XWRUEAV SCDs and early ambulation. Code Status: Full Code  Disposition Plan: To home in 1-2 days  All the records are reviewed and case discussed with ED provider. Management plans discussed with the patient and/or family who express understanding and agree with plan of care.  Raistlin Gum D.O. on 03/06/2017 at 12:10 AM Between 7am to 6pm - Pager - [phone] After 6pm go to [email or web address] - Biomedical [employer] [facility] Office [phone] CC: Primary care physician; Sharilyn Sites, MD   03/06/2017, 12:10 AM

## 2017-03-06 NOTE — Progress Notes (Signed)
Patient is a&o x4, pleasant and conversive, patient is discharging to home, family providing transportation, no new Rx per doctor, on supplemental o2, patient recommended to f/u with CHF clinic, PCP, and cardiology. Shift uneventful, security returned belongings from safe.

## 2017-03-06 NOTE — Plan of Care (Signed)
Problem: Acute Rehab PT Goals(only PT should resolve) Goal: Pt Will Ambulate Outcome: Progressing Patient will be able to ambulate over 1.0 m/s to decrease risk of falling with community ambulation.

## 2017-03-06 NOTE — Progress Notes (Signed)
Pt lethargic and unable to clearly answer questions without falling asleep. Vital signs stable. Pnt's admission profile not completed at this time. No other concerns will continue to monitor and assess. No family at bedside.

## 2017-03-06 NOTE — Evaluation (Signed)
Physical Therapy Evaluation Patient Details Name: Kevin Pearson MRN: [id number] DOB: [birth year] Today's Date: 03/06/2017   History of Present Illness  Patient is a 66 yo patient presenting to the hospital with a COPD exaccerbation. Patient has a history of CHF, chronic SOB, Afib MI, and CAD. Patient reports increased breathless upon coming to the hospital.   Clinical Impression  Patient demonstrates minimal balance difficulties and strength deficits  in standing. Patient does walk with a widened BOS with standing. Performed ambulation x131f with 2L O2 through nasal cannula. Patient demonstrates no O2 desaturation during ambulation. Patient will benefit from OP PT to address oxygen desaturation difficulties. Recommend OP PT.     Follow Up Recommendations Outpatient PT    Equipment Recommendations       Recommendations for Other Services       Precautions / Restrictions Precautions Precautions: Fall      Mobility  Bed Mobility                  Transfers Overall transfer level: Independent               General transfer comment: Able to perform without PT assitance   Ambulation/Gait Ambulation/Gait assistance: Supervision Ambulation Distance (Feet): 180 Feet   Gait Pattern/deviations: Step-through pattern;Wide base of support;Decreased stride length   Gait velocity interpretation: Below normal speed for age/gender    Stairs            Wheelchair Mobility    Modified Rankin (Stroke Patients Only)       Balance Overall balance assessment: Independent;History of Falls                                           Pertinent Vitals/Pain Pain Assessment: No/denies pain    Home Living Family/patient expects to be discharged to:: Private residence Living Arrangements: Alone Available Help at Discharge: Family Type of Home: Apartment Home Access: Level entry     Home Layout: One level        Prior Function Level of  Independence: Independent               Hand Dominance        Extremity/Trunk Assessment   Upper Extremity Assessment Upper Extremity Assessment: Overall WFL for tasks assessed    Lower Extremity Assessment Lower Extremity Assessment: Generalized weakness       Communication   Communication: No difficulties  Cognition Arousal/Alertness: Awake/alert   Overall Cognitive Status: Within Functional Limits for tasks assessed                                        General Comments      Exercises Other Exercises Other Exercises: Patient ambulates 180 ft with supervision assist. Patient O2 measured with all measurements staying above 100%   Assessment/Plan    PT Assessment All further PT needs can be met in the next venue of care  PT Problem List Decreased strength;Decreased balance;Decreased range of motion       PT Treatment Interventions      PT Goals (Current goals can be found in the Care Plan section)  Acute Rehab PT Goals Patient Stated Goal: To go home PT Goal Formulation: With patient/family Time For Goal Achievement: 03/20/17 Potential to Achieve Goals: Good  Frequency     Barriers to discharge        Co-evaluation               AM-PAC PT "6 Clicks" Daily Activity  Outcome Measure Difficulty turning over in bed (including adjusting bedclothes, sheets and blankets)?: None Difficulty moving from lying on back to sitting on the side of the bed? : None Difficulty sitting down on and standing up from a chair with arms (e.g., wheelchair, bedside commode, etc,.)?: None Help needed moving to and from a bed to chair (including a wheelchair)?: None Help needed walking in hospital room?: None Help needed climbing 3-5 steps with a railing? : A Little 6 Click Score: 23    End of Session Equipment Utilized During Treatment: Gait belt Activity Tolerance: Patient tolerated treatment well Patient left: in bed;with call bell/phone  within reach;with family/visitor present Nurse Communication: Mobility status PT Visit Diagnosis: Unsteadiness on feet (R26.81);Other abnormalities of gait and mobility (R26.89);Muscle weakness (generalized) (M62.81);Difficulty in walking, not elsewhere classified (R26.2)    Time: 0415-9301 PT Time Calculation (min) (ACUTE ONLY): 13 min   Charges:   PT Evaluation $PT Eval Low Complexity: 1 Low $PT Eval Moderate Complexity: 1 Mod PT Treatments $Gait Training: 8-22 mins   PT G Codes:   PT G-Codes **NOT FOR INPATIENT CLASS** Functional Assessment Tool Used: AM-PAC 6 Clicks Basic Mobility Functional Limitation: Mobility: Walking and moving around Mobility: Walking and Moving Around Current Status (S3799): At least 1 percent but less than 20 percent impaired, limited or restricted Mobility: Walking and Moving Around Goal Status [phone]): At least 1 percent but less than 20 percent impaired, limited or restricted  Blythe Stanford, PT DPT 03/06/17, 1:25 PM [phone]

## 2017-03-06 NOTE — Discharge Summary (Signed)
[facility] [place] [facility]   PATIENT NAME: Kevin Pearson    MR#:  [id number]  DATE OF BIRTH:  20-Apr-1951  DATE OF ADMISSION:  03/05/2017   ADMITTING PHYSICIAN: Tonye Royalty, DO  DATE OF DISCHARGE: 03/06/2017 12:20 PM  PRIMARY CARE PHYSICIAN: Sharilyn Sites, MD   ADMISSION DIAGNOSIS:  Weakness [R53.1] COPD exacerbation (HCC) [J44.1] DISCHARGE DIAGNOSIS:  Active Problems:   COPD exacerbation (HCC)  SECONDARY DIAGNOSIS:   Past Medical History:  Diagnosis Date  . Acid reflux   . Anxiety   . Arthritis   . Atrial fibrillation (HCC)   . CHF (congestive heart failure) (HCC)   . Chronic orthostatic hypotension   . Clotting disorder (HCC)   . COPD (chronic obstructive pulmonary disease) (HCC)   . Depression   . Elevated PSA   . Heart attack (HCC)   . Heart disease   . Heart failure (HCC)   . Hepatitis   . High cholesterol   . Hypertension   . Sleep apnea   . TBI (traumatic brain injury) (HCC)   . Urinary retention    HOSPITAL COURSE:  This is a 66 y.o. male with a history of COPD, CHF, afib, CAD s/p MI, HTN, HLD, OSA, TBI  admitted with:  #. Acute exacerbation of COPD - he responded well to nebs, steroids.  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 H/o HTN - Continue Entresto, Coreg, hydralazine  #. H/O BPH - Continue Flomax  #. History of CAD - Continue Plavix, nitro  #. History of GERD - Continue Protonix for Pepcid  #. History of afib - Continue Xarelto, Coreg  #. History of HLD - Continue Crestor  # Sleepiness - cut back on the dose of trazodone. D/w daughter who is in agreement and so is patient.  DISCHARGE CONDITIONS:  stable CONSULTS OBTAINED:   DRUG ALLERGIES:   Allergies  Allergen Reactions  . Other Anaphylaxis and  Itching    Mango skin  . Codeine Nausea And Vomiting   DISCHARGE MEDICATIONS:   Allergies as of 03/06/2017      Reactions   Other Anaphylaxis, Itching   Mango skin   Codeine Nausea And Vomiting      Medication List    TAKE these medications   acetaminophen 325 MG tablet Commonly known as:  TYLENOL Take 650 mg by mouth every 6 (six) hours as needed.   albuterol 108 (90 Base) MCG/ACT inhaler Commonly known as:  PROVENTIL HFA;VENTOLIN HFA Inhale 2 puffs into the lungs every 4 (four) hours as needed for shortness of breath.   budesonide 0.5 MG/2ML nebulizer solution Commonly known as:  PULMICORT Inhale 2 mLs into the lungs daily.   carvedilol 6.25 MG tablet Commonly known as:  COREG Take 6.25 mg by mouth 2 (two) times daily.   clonazePAM 1 MG tablet Commonly known as:  KLONOPIN Take 1 mg by mouth 3 (three) times daily.   clopidogrel 75 MG tablet Commonly known as:  PLAVIX Take 75 mg by mouth daily.   cyclobenzaprine 5 MG tablet Commonly known as:  FLEXERIL Take 1 tablet (5 mg total) by mouth 3 (three) times daily as needed.   divalproex 250 MG 24 hr tablet Commonly known as:  DEPAKOTE ER Take 750 mg by mouth 2 (two) times  daily.   furosemide 40 MG tablet Commonly known as:  LASIX Take 40 mg by mouth 2 (two) times daily.   gabapentin 300 MG capsule Commonly known as:  NEURONTIN Take 600 mg by mouth 3 (three) times daily. Take 300 mg in the morning, 300 mg at 2 pm and 300 mg at night.   hydrALAZINE 25 MG tablet Commonly known as:  APRESOLINE Take 25 mg by mouth 3 (three) times daily.   Hyoscyamine Sulfate SL 0.125 MG Subl Commonly known as:  LEVSIN/SL Place 1 tablet under the tongue daily.   ipratropium-albuterol 0.5-2.5 (3) MG/3ML Soln Commonly known as:  DUONEB Take 3 mLs by nebulization every 6 (six) hours as needed (wheezing/shortness of breath).   Melatonin 10 MG Tabs Take 10 mg by mouth at bedtime.   nitroGLYCERIN 0.4 MG SL tablet Commonly known  as:  NITROSTAT Place 0.4 mg under the tongue every 5 (five) minutes as needed for chest pain.   omeprazole 40 MG capsule Commonly known as:  PRILOSEC Take 40 mg by mouth daily.   rosuvastatin 20 MG tablet Commonly known as:  CRESTOR Take 20 mg by mouth daily.   sacubitril-valsartan 24-26 MG Commonly known as:  ENTRESTO Take 1 tablet by mouth 2 (two) times daily.   tamsulosin 0.4 MG Caps capsule Commonly known as:  FLOMAX TAKE ONE CAPSULE BY MOUTH EVERY DAY AFTER SUPPER   traMADol 50 MG tablet Commonly known as:  ULTRAM Take 50 mg by mouth every 6 (six) hours as needed for moderate pain or severe pain.   traZODone 50 MG tablet Commonly known as:  DESYREL Take 1 tablet (50 mg total) by mouth at bedtime. What changed:  medication strength  how much to take  Another medication with the same name was removed. Continue taking this medication, and follow the directions you see here.   TRINTELLIX 5 MG Tabs Generic drug:  vortioxetine HBr Take by mouth.   XARELTO 20 MG Tabs tablet Generic drug:  rivaroxaban Take 20 mg by mouth at bedtime.            Discharge Care Instructions        Start     Ordered   03/06/17 0000  traZODone (DESYREL) 50 MG tablet  Daily at bedtime     03/06/17 0810   03/06/17 0000  Increase activity slowly     03/06/17 0810   03/06/17 0000  Diet - low sodium heart healthy     03/06/17 0810   03/06/17 0000  AMB referral to CHF clinic     03/06/17 0810   03/06/17 0000  Amb Referral to Cardiac Rehabilitation    Question Answer Comment  Diagnosis: Heart Failure (see criteria below if ordering Phase II)   Heart Failure Type: Chronic Systolic & Diastolic      03/06/17 0814       DISCHARGE INSTRUCTIONS:   DIET:  Cardiac diet DISCHARGE CONDITION:  Stable ACTIVITY:  Activity as tolerated OXYGEN:  Home Oxygen: Yes.    Oxygen Delivery: 2 liters/min via Patient connected to nasal cannula oxygen DISCHARGE LOCATION:  home   If you  experience worsening of your admission symptoms, develop shortness of breath, life threatening emergency, suicidal or homicidal thoughts you must seek medical attention immediately by calling 911 or calling your MD immediately  if symptoms less severe.  You Must read complete instructions/literature along with all the possible adverse reactions/side effects for all the Medicines you take and that have been prescribed to you.  Take any new Medicines after you have completely understood and accpet all the possible adverse reactions/side effects.   Please note  You were cared for by a hospitalist during your hospital stay. If you have any questions about your discharge medications or the care you received while you were in the hospital after you are discharged, you can call the unit and asked to speak with the hospitalist on call if the hospitalist that took care of you is not available. Once you are discharged, your primary care physician will handle any further medical issues. Please note that NO REFILLS for any discharge medications will be authorized once you are discharged, as it is imperative that you return to your primary care physician (or establish a relationship with a primary care physician if you do not have one) for your aftercare needs so that they can reassess your need for medications and monitor your lab values.    On the day of Discharge:  VITAL SIGNS:  Blood pressure 134/78, pulse 70, temperature 97.6 F (36.4 C), temperature source Oral, resp. rate 19, height 5\' 8"  (1.727 m), weight 126.5 kg (278 lb 12.8 oz), SpO2 98 %. PHYSICAL EXAMINATION:  GENERAL:  66 y.o.-year-old patient lying in the bed with no acute distress.  EYES: Pupils equal, round, reactive to light and accommodation. No scleral icterus. Extraocular muscles intact.  HEENT: Head atraumatic, normocephalic. Oropharynx and nasopharynx clear.  NECK:  Supple, no jugular venous distention. No thyroid enlargement, no  tenderness.  LUNGS: Normal breath sounds bilaterally, no wheezing, rales,rhonchi or crepitation. No use of accessory muscles of respiration.  CARDIOVASCULAR: S1, S2 normal. No murmurs, rubs, or gallops.  ABDOMEN: Soft, non-tender, non-distended. Bowel sounds present. No organomegaly or mass.  EXTREMITIES: No pedal edema, cyanosis, or clubbing.  NEUROLOGIC: Cranial nerves II through XII are intact. Muscle strength 5/5 in all extremities. Sensation intact. Gait not checked.  PSYCHIATRIC: The patient is alert and oriented x 3.  SKIN: No obvious rash, lesion, or ulcer.  DATA REVIEW:   CBC  Recent Labs Lab 03/06/17 0442  WBC 5.1  HGB 13.2  HCT 39.5*  PLT 186    Chemistries   Recent Labs Lab 03/06/17 0442  NA 144  K 3.6  CL 111  CO2 27  GLUCOSE 179*  BUN 25*  CREATININE 1.36*  CALCIUM 9.5  MG 1.7     Follow-up Information    Heffington, Loraine LericheMark, MD. Schedule an appointment as soon as possible for a visit in 1 week(s).   Specialty:  Family Medicine Contact information: [address] [facility] [place] [phone]        Marcina MillardParaschos, Alexander, MD. Schedule an appointment as soon as possible for a visit in 2 week(s).   Specialty:  Cardiology Contact information: [address] [facility] [place] [address] [phone]           Management plans discussed with the patient, family and they are in agreement.  CODE STATUS: Prior   TOTAL TIME TAKING CARE OF THIS PATIENT: 45 minutes.    Delfino LovettVipul Lenise Pearson M.D on 03/06/2017 at 8:13 PM  Between 7am to 6pm - Pager - [phone]  After 6pm go to [email or web address] - Scientist, research (life sciences)password [email or web address] [email or web address]  [facility]  Office  [phone]  CC: Primary care physician; Sharilyn SitesHeffington, Mark, MD   Note: This dictation was prepared with Dragon dictation along with smaller phrase technology. Any transcriptional errors that result from this process are  unintentional.

## 2017-03-06 NOTE — Discharge Instructions (Signed)
Weakness Weakness is a lack of strength. You may feel weak all over your body (generalized), or you may feel weak in one specific part of your body (focal). There are many potential causes of weakness. Sometimes, the cause of your weakness may not be known. Some causes of weakness can be serious, so it is important to see your doctor. Follow these instructions at home:  Rest as needed.  Try to get enough sleep. Talk to your doctor about how much sleep you need each night.  Take over-the-counter and prescription medicines only as told by your doctor.  Eat a healthy, well-balanced diet. This includes: ? Proteins to build muscles, such as lean meats and fish. ? Fresh fruits and vegetables. ? Carbohydrates to boost energy, such as whole grains.  Drink enough fluid to keep your pee (urine) clear or pale yellow.  Do strength exercises, such as arm curls and leg raises, for 30 minutes at least 2 days a week or as told by your doctor.  Think about working with a physical therapist or trainer to help you get stronger.  Keep all follow-up visits as told by your doctor. This is important. Contact a doctor if:  Your weakness does not get better or it gets worse.  Your weakness affects your ability to: ? Think clearly. ? Do your normal daily activities. Get help right away if:  You have sudden weakness.  You have trouble breathing or shortness of breath.  You have problems with your vision.  You have trouble talking or swallowing.  You have trouble standing or walking.  You have chest pain.  You are light-headed.  You pass out (lose consciousness). This information is not intended to replace advice given to you by your health care provider. Make sure you discuss any questions you have with your health care provider. Document Released: 06/04/2008 Document Revised: 07/18/2015 Document Reviewed: 04/12/2015 Elsevier Interactive Patient Education  2018 [employer]  

## 2017-03-07 LAB — HIV ANTIBODY (ROUTINE TESTING W REFLEX): HIV Screen 4th Generation wRfx: NONREACTIVE

## 2017-03-09 ENCOUNTER — Encounter: Payer: Self-pay | Admitting: *Deleted

## 2017-03-16 ENCOUNTER — Ambulatory Visit: Payer: BLUE CROSS/BLUE SHIELD | Attending: Family | Admitting: Family

## 2017-03-16 VITALS — BP 102/69 | HR 68 | Resp 20 | Ht 68.0 in | Wt 277.4 lb

## 2017-03-16 DIAGNOSIS — I950 Idiopathic hypotension: Secondary | ICD-10-CM

## 2017-03-16 DIAGNOSIS — G4733 Obstructive sleep apnea (adult) (pediatric): Secondary | ICD-10-CM | POA: Insufficient documentation

## 2017-03-16 DIAGNOSIS — M549 Dorsalgia, unspecified: Secondary | ICD-10-CM | POA: Insufficient documentation

## 2017-03-16 DIAGNOSIS — I4891 Unspecified atrial fibrillation: Secondary | ICD-10-CM | POA: Diagnosis not present

## 2017-03-16 DIAGNOSIS — J449 Chronic obstructive pulmonary disease, unspecified: Secondary | ICD-10-CM | POA: Insufficient documentation

## 2017-03-16 DIAGNOSIS — K219 Gastro-esophageal reflux disease without esophagitis: Secondary | ICD-10-CM | POA: Insufficient documentation

## 2017-03-16 DIAGNOSIS — I110 Hypertensive heart disease with heart failure: Secondary | ICD-10-CM | POA: Insufficient documentation

## 2017-03-16 DIAGNOSIS — I959 Hypotension, unspecified: Secondary | ICD-10-CM | POA: Insufficient documentation

## 2017-03-16 DIAGNOSIS — Z87891 Personal history of nicotine dependence: Secondary | ICD-10-CM | POA: Insufficient documentation

## 2017-03-16 DIAGNOSIS — F329 Major depressive disorder, single episode, unspecified: Secondary | ICD-10-CM | POA: Diagnosis not present

## 2017-03-16 DIAGNOSIS — I252 Old myocardial infarction: Secondary | ICD-10-CM | POA: Insufficient documentation

## 2017-03-16 DIAGNOSIS — I5022 Chronic systolic (congestive) heart failure: Secondary | ICD-10-CM | POA: Diagnosis present

## 2017-03-16 DIAGNOSIS — Z9981 Dependence on supplemental oxygen: Secondary | ICD-10-CM | POA: Diagnosis not present

## 2017-03-16 DIAGNOSIS — F32A Depression, unspecified: Secondary | ICD-10-CM

## 2017-03-16 DIAGNOSIS — Z7901 Long term (current) use of anticoagulants: Secondary | ICD-10-CM | POA: Insufficient documentation

## 2017-03-16 DIAGNOSIS — R531 Weakness: Secondary | ICD-10-CM | POA: Insufficient documentation

## 2017-03-16 DIAGNOSIS — E7800 Pure hypercholesterolemia, unspecified: Secondary | ICD-10-CM | POA: Insufficient documentation

## 2017-03-16 DIAGNOSIS — F419 Anxiety disorder, unspecified: Secondary | ICD-10-CM | POA: Diagnosis not present

## 2017-03-16 NOTE — Progress Notes (Signed)
Patient ID: Kevin Pearson, male    DOB: Dec 10, 1950, 66 y.o.   MRN: [id number]  HPI  Kevin Pearson is Pearson 66 y/o male with Pearson history of GERD, anxiety, atrial fibrillation, depression, COPD (chronic O2), TBI, MI (multiple stents), hepatitis, hyperlipidemia, HTN, obstructive sleep apnea (without CPAP), remote tobacco/drug/alcohol use and chronic heart failure.   Reviewed last echo report on 02/14/16 which showed an EF of 35%.   Admitted 03/05/17 due to COPD, weakness and HF. Initially needed nebulizers, steroids and IV diuretics. Discharged home the next day. Was in the ED 12/14/16 due to back pain after Pearson mechanical fall. Treated and released. Was in the ED 10/06/16 due to hemoptysis. Evaluated and discharged home. ED visit on 07/27/16 due to chest pain. Troponins were negative and he was discharged home. Was in the ED 07/02/16 due to bladder spasms. Treated and discharged home. Admitted 06/24/16 due to urinary retention. Had urethral dilation done and was discharged the next day.   He presents today for his follow-up visit with Pearson chief complaint of moderate fatigue with little exertion. He describes it as chronic in nature and has been present for the last few years. Does wax and wane in severity. Fatigue does improve slightly with rest. Has associated shortness of breath, pedal edema, and light-headedness along with this.  Past Medical History:  Diagnosis Date  . Acid reflux   . Anxiety   . Arthritis   . Atrial fibrillation (HCC)   . CHF (congestive heart failure) (HCC)   . Chronic orthostatic hypotension   . Clotting disorder (HCC)   . COPD (chronic obstructive pulmonary disease) (HCC)   . Depression   . Elevated PSA   . Heart attack (HCC)   . Heart disease   . Heart failure (HCC)   . Hepatitis   . High cholesterol   . Hypertension   . Sleep apnea   . TBI (traumatic brain injury) (HCC)   . Urinary retention    Past Surgical History:  Procedure Laterality Date  . BLADDER SURGERY    . cardiac  stents    . CYSTOSCOPY WITH URETHRAL DILATATION N/Pearson 06/25/2016   Procedure: CYSTOSCOPY WITH URETHRAL DILATATION;  Surgeon: Kevin Scotland, MD;  Location: [facility] ORS;  Service: Urology;  Laterality: N/Pearson;  . EP IMPLANTABLE DEVICE     St. Jude BiV-ICD  . GREEN LIGHT LASER TURP (TRANSURETHRAL RESECTION OF PROSTATE  2016   done in [facility]    Family History  Problem Relation Age of Onset  . Other Father        Cerebral hemorrhage  . Kidney cancer Neg Hx   . Kidney disease Neg Hx   . Prostate cancer Neg Hx    Social History  Substance Use Topics  . Smoking status: Former Smoker    Types: Cigarettes    Quit date: 08/2015  . Smokeless tobacco: Never Used  . Alcohol use Yes   Allergies  Allergen Reactions  . Other Anaphylaxis and Itching    Mango skin  . Codeine Nausea And Vomiting   Prior to Admission medications   Medication Sig Start Date End Date Taking? Authorizing Provider  acetaminophen (TYLENOL) 325 MG tablet Take 650 mg by mouth every 6 (six) hours as needed.   Yes [provider]  albuterol (PROVENTIL HFA;VENTOLIN HFA) 108 (90 Base) MCG/ACT inhaler Inhale 2 puffs into the lungs every 4 (four) hours as needed for shortness of breath.   Yes [provider]  budesonide (PULMICORT) 0.5 MG/2ML  nebulizer solution Inhale 2 mLs into the lungs daily. 09/26/16  Yes [provider]  carvedilol (COREG) 6.25 MG tablet Take 6.25 mg by mouth 2 (two) times daily.   Yes [provider]  clonazePAM (KLONOPIN) 1 MG tablet Take 1 mg by mouth 3 (three) times daily.    Yes [provider]  clopidogrel (PLAVIX) 75 MG tablet Take 75 mg by mouth daily.   Yes [provider]  divalproex (DEPAKOTE ER) 250 MG 24 hr tablet Take 750 mg by mouth 2 (two) times daily.    Yes [provider]  furosemide (LASIX) 40 MG tablet Take 40 mg by mouth 2 (two) times daily.   Yes [provider]  gabapentin (NEURONTIN) 300 MG capsule Take 600 mg by mouth 3  (three) times daily. Take 300 mg in the morning, 300 mg at 2 pm and 300 mg at night.   Yes [provider]  hydrALAZINE (APRESOLINE) 25 MG tablet Take 25 mg by mouth 3 (three) times daily.   Yes [provider]  ipratropium-albuterol (DUONEB) 0.5-2.5 (3) MG/3ML SOLN Take 3 mLs by nebulization every 6 (six) hours as needed (wheezing/shortness of breath).   Yes [provider]  Melatonin 10 MG TABS Take 10 mg by mouth at bedtime.    Yes [provider]  nitroGLYCERIN (NITROSTAT) 0.4 MG SL tablet Place 0.4 mg under the tongue every 5 (five) minutes as needed for chest pain.   Yes [provider]  omeprazole (PRILOSEC) 40 MG capsule Take 40 mg by mouth daily.   Yes [provider]  rivaroxaban (XARELTO) 20 MG TABS tablet Take 20 mg by mouth at bedtime.   Yes [provider]  rosuvastatin (CRESTOR) 20 MG tablet Take 20 mg by mouth daily.   Yes [provider]  sacubitril-valsartan (ENTRESTO) 24-26 MG Take 1 tablet by mouth 2 (two) times daily. 01/21/17  Yes Kevin Pearson, Kevin Amster A, FNP  tamsulosin (FLOMAX) 0.4 MG CAPS capsule TAKE ONE CAPSULE BY MOUTH EVERY DAY AFTER SUPPER 11/23/16  Yes Kevin Pearson, Ashley, MD  traMADol (ULTRAM) 50 MG tablet Take 50 mg by mouth every 6 (six) hours as needed for moderate pain or severe pain.    Yes [provider]  traZODone (DESYREL) 50 MG tablet Take 1 tablet (50 mg total) by mouth at bedtime. 03/06/17  Yes Delfino LovettShah, Vipul, MD  vortioxetine HBr (TRINTELLIX) 5 MG TABS Take by mouth.   Yes [provider]   Review of Systems  Constitutional: Positive for fatigue. Negative for appetite change.  HENT: Negative for congestion, postnasal drip and sore throat.   Eyes: Negative.   Respiratory: Positive for shortness of breath. Negative for cough and chest tightness.   Cardiovascular: Positive for leg swelling. Negative for chest pain and palpitations.  Gastrointestinal: Positive for abdominal distention.  Negative for abdominal pain.  Endocrine: Negative.   Genitourinary: Negative.   Musculoskeletal: Positive for arthralgias (bilateral foot pain). Negative for back pain.  Skin: Negative.   Allergic/Immunologic: Negative.   Neurological: Positive for tremors, light-headedness (when changing positions too quickly) and numbness (bilateral feet). Negative for dizziness.       Memory issues   Hematological: Negative for adenopathy. Does not bruise/bleed easily.  Psychiatric/Behavioral: Negative for dysphoric mood and sleep disturbance (sleeping on 2 pillows; wearing oxygen at 2L around the clock). The patient is nervous/anxious.    Vitals:   03/16/17 1333  BP: 102/69  Pulse: 68  Resp: 20  SpO2: 100%  Weight: 277 lb  6 oz (125.8 kg)  Height:  (1.727 m)   Wt Readings from Last 3 Encounters:  03/16/17 277 lb 6 oz (125.8 kg)  03/06/17 278 lb 12.8 oz (126.5 kg)  02/26/17 276 lb 6.4 oz (125.4 kg)    Lab Results  Component Value Date   CREATININE 1.36 (H) 03/06/2017   CREATININE 1.25 (H) 03/05/2017   CREATININE 1.37 (H) 01/04/2017   Physical Exam  Constitutional: He is oriented to person, place, and time. He appears well-developed and well-nourished.  HENT:  Head: Normocephalic and atraumatic.  Neck: Normal range of motion. Neck supple. No JVD present.  Cardiovascular: Normal rate and regular rhythm.   Pulmonary/Chest: Effort normal. He has no wheezes. He has no rales.  Abdominal: Soft. He exhibits no distension. There is no tenderness.  Musculoskeletal: He exhibits edema (2+ pitting edema in bilateral lower legs). He exhibits no tenderness.  Neurological: He is alert and oriented to person, place, and time.  Skin: Skin is warm and dry.  Psychiatric: He has Pearson normal mood and affect. His behavior is normal.  Nursing note and vitals reviewed.  Assessment & Plan:  1: Chronic heart failure with reduced ejection fraction- - NYHA class III - mildly fluid overloaded  - has been  taking furosemide  BID  - has been weighing daily. Reminded to weigh every morning, write the weight down and call for an overnight weight gain of >2 pounds or Pearson weekly weight gain of >5 pounds.  - not adding salt to his food. Reviewed Pearson  sodium diet as he admits that he tends to eat salty foods at times - continues to drink excessive fluids; counseled on the importance of keeping fluid intake to close to 48 ounces daily - saw cardiologist (Paraschos) 01/28/17 - wearing compression socks & he was encouraged to elevate his legs as much as possible - due to low BP, unable to titrate entresto at this time.  2: Hypotension- - BP low  - saw PCP ([place]) 03/04/17 - BMP done 03/06/17 reviewed; sodium 144, potassium 3.6, GFR 53  3: Obstructive sleep apnea- - wears oxygen at 2-3L around the clock - unable to tolerate wearing CPAP due to anxiety/claustrophobia - saw pulmonologist Meredeth Ide) 12/16/16  4: Anxiety/depression- - continues to live on his own and is managing his medications - patient says that he no longer wants [place] to speak with his daughter or son regarding anything pertaining to his health.  - patient signed Pearson consent to share information form and wrote that we speak to no one other than the patient  Medication list was reviewed.  Return in 3 months or sooner for any questions/problems before then.

## 2017-03-16 NOTE — Patient Instructions (Signed)
Continue weighing daily and call for an overnight weight gain of > 2 pounds or a weekly weight gain of >5 pounds. 

## 2017-03-17 ENCOUNTER — Encounter: Payer: Self-pay | Admitting: Family

## 2017-03-19 ENCOUNTER — Other Ambulatory Visit: Payer: Self-pay | Admitting: Family

## 2017-03-22 ENCOUNTER — Encounter: Payer: Self-pay | Admitting: Emergency Medicine

## 2017-03-22 ENCOUNTER — Emergency Department: Payer: BLUE CROSS/BLUE SHIELD

## 2017-03-22 ENCOUNTER — Emergency Department
Admission: EM | Admit: 2017-03-22 | Discharge: 2017-03-22 | Disposition: A | Discharge status: Home / Self Care | Payer: BLUE CROSS/BLUE SHIELD | Attending: Emergency Medicine | Admitting: Emergency Medicine

## 2017-03-22 DIAGNOSIS — I5020 Unspecified systolic (congestive) heart failure: Secondary | ICD-10-CM

## 2017-03-22 DIAGNOSIS — J449 Chronic obstructive pulmonary disease, unspecified: Secondary | ICD-10-CM | POA: Insufficient documentation

## 2017-03-22 DIAGNOSIS — Z7901 Long term (current) use of anticoagulants: Secondary | ICD-10-CM | POA: Insufficient documentation

## 2017-03-22 DIAGNOSIS — R0600 Dyspnea, unspecified: Secondary | ICD-10-CM

## 2017-03-22 DIAGNOSIS — Z87891 Personal history of nicotine dependence: Secondary | ICD-10-CM | POA: Insufficient documentation

## 2017-03-22 DIAGNOSIS — I5022 Chronic systolic (congestive) heart failure: Secondary | ICD-10-CM | POA: Diagnosis not present

## 2017-03-22 DIAGNOSIS — I110 Hypertensive heart disease with heart failure: Secondary | ICD-10-CM | POA: Diagnosis not present

## 2017-03-22 DIAGNOSIS — L03115 Cellulitis of right lower limb: Secondary | ICD-10-CM | POA: Insufficient documentation

## 2017-03-22 DIAGNOSIS — L03116 Cellulitis of left lower limb: Secondary | ICD-10-CM | POA: Insufficient documentation

## 2017-03-22 DIAGNOSIS — Z79899 Other long term (current) drug therapy: Secondary | ICD-10-CM | POA: Insufficient documentation

## 2017-03-22 DIAGNOSIS — R0602 Shortness of breath: Secondary | ICD-10-CM | POA: Diagnosis present

## 2017-03-22 DIAGNOSIS — Z7902 Long term (current) use of antithrombotics/antiplatelets: Secondary | ICD-10-CM | POA: Diagnosis not present

## 2017-03-22 DIAGNOSIS — L0390 Cellulitis, unspecified: Secondary | ICD-10-CM

## 2017-03-22 LAB — COMPREHENSIVE METABOLIC PANEL
ALT: 13 U/L — ABNORMAL LOW (ref 17–63)
AST: 21 U/L (ref 15–41)
Albumin: 3.3 g/dL — ABNORMAL LOW (ref 3.5–5.0)
Alkaline Phosphatase: 37 U/L — ABNORMAL LOW (ref 38–126)
Anion gap: 9 (ref 5–15)
BUN: 25 mg/dL — ABNORMAL HIGH (ref 6–20)
CO2: 26 mmol/L (ref 22–32)
Calcium: 9 mg/dL (ref 8.9–10.3)
Chloride: 104 mmol/L (ref 101–111)
Creatinine, Ser: 1.21 mg/dL (ref 0.61–1.24)
GFR calc Af Amer: >60 mL/min (ref >60)
GFR calc non Af Amer: >60 mL/min (ref >60)
Glucose, Bld: 97 mg/dL (ref 65–99)
Potassium: 4.1 mmol/L (ref 3.5–5.1)
Sodium: 139 mmol/L (ref 135–145)
Total Bilirubin: 0.6 mg/dL (ref 0.3–1.2)
Total Protein: 6.9 g/dL (ref 6.5–8.1)

## 2017-03-22 LAB — TROPONIN I: Troponin I: <0.03 ng/mL (ref <0.03)

## 2017-03-22 LAB — CBC
HCT: 38.5 % — ABNORMAL LOW (ref 40.0–52.0)
Hemoglobin: 12.9 g/dL — ABNORMAL LOW (ref 13.0–18.0)
MCH: 30 pg (ref 26.0–34.0)
MCHC: 33.5 g/dL (ref 32.0–36.0)
MCV: 89.6 fL (ref 80.0–100.0)
Platelets: 183 10*3/uL (ref 150–440)
RBC: 4.3 MIL/uL — ABNORMAL LOW (ref 4.40–5.90)
RDW: 16.7 % — ABNORMAL HIGH (ref 11.5–14.5)
WBC: 7.1 10*3/uL (ref 3.8–10.6)

## 2017-03-22 LAB — BRAIN NATRIURETIC PEPTIDE: B Natriuretic Peptide: 259 pg/mL — ABNORMAL HIGH (ref 0.0–100.0)

## 2017-03-22 MED ORDER — SODIUM CHLORIDE 0.9 % IV SOLN
INTRAVENOUS | Status: AC
  Administered 2017-03-22: 3 g via INTRAVENOUS
  Filled 2017-03-22: qty 3

## 2017-03-22 MED ORDER — AMOXICILLIN-POT CLAVULANATE 875-125 MG PO TABS: 1.0000 | ORAL_TABLET | Freq: Two times a day (BID) | ORAL | 0 refills | Status: DC

## 2017-03-22 MED ORDER — SODIUM CHLORIDE 0.9 % IV SOLN
3.0000 g | Freq: Once | INTRAVENOUS | Status: AC
  Administered 2017-03-22: 3 g via INTRAVENOUS

## 2017-03-22 MED ORDER — FUROSEMIDE 10 MG/ML IJ SOLN
40.0000 mg | Freq: Once | INTRAMUSCULAR | Status: AC
  Administered 2017-03-22: 40 mg via INTRAVENOUS
  Filled 2017-03-22: qty 4

## 2017-03-22 NOTE — ED Notes (Signed)
Pt received written dc instructions, denied questions, stated understood importance os followup, rx abx and sxs that would prompt return to ED. E-sign ad not working.

## 2017-03-22 NOTE — Discharge Instructions (Signed)
Follow-up with your doctor tomorrow. Take an extra Lasix in the morning. Also take Augmentin one twice a day in case her developing a cellulitis in your legs. Return if you're worse at all, worse redness, any fever, or shortness of breath, or feeling sicker.

## 2017-03-22 NOTE — ED Provider Notes (Signed)
[facility] Emergency Department Provider Note   ____________________________________________   First MD Initiated Contact with Patient 03/22/17 1753     (approximate)  I have reviewed the triage vital signs and the nursing notes.   HISTORY  Chief Complaint Shortness of Breath   HPI Kevin Pearson is a 66 y.o. male Who reports increasing leg swelling  And shortness of breath. He was in the hospital at the end but left early. He is taking Lasix. 40 mg twice a day. He thinks he may need more. His legs are swelling more.they have been gradually getting worse. His shortness of breath is returning and getting worse slowly  Past Medical History:  Diagnosis Date  . Acid reflux   . Anxiety   . Arthritis   . Atrial fibrillation (HCC)   . CHF (congestive heart failure) (HCC)   . Chronic orthostatic hypotension   . Clotting disorder (HCC)   . COPD (chronic obstructive pulmonary disease) (HCC)   . Depression   . Elevated PSA   . Heart attack (HCC)   . Heart disease   . Heart failure (HCC)   . Hepatitis   . High cholesterol   . Hypertension   . Sleep apnea   . TBI (traumatic brain injury) (HCC)   . Urinary retention     Patient Active Problem List   Diagnosis Date Noted  . COPD exacerbation (HCC) 03/06/2017  . Hypotension 10/29/2016  . Obstructive sleep apnea 10/29/2016  . Chronic bilateral low back pain 08/27/2016  . H/O hypotension 07/29/2016  . Chronic obstructive pulmonary disease (HCC) 07/16/2016  . Chronic systolic congestive heart failure (HCC) 07/16/2016  . Peripheral neuropathy 07/16/2016  . Anxiety and depression 07/16/2016  . Arthralgia of right foot 07/16/2016  . Urethral stricture 07/16/2016  . Urinary retention 06/24/2016    Past Surgical History:  Procedure Laterality Date  . BLADDER SURGERY    . cardiac stents    . CYSTOSCOPY WITH URETHRAL DILATATION N/A 06/25/2016   Procedure: CYSTOSCOPY WITH URETHRAL DILATATION;  Surgeon:  Vanna Scotland, MD;  Location: [facility] ORS;  Service: Urology;  Laterality: N/A;  . EP IMPLANTABLE DEVICE     St. Jude BiV-ICD  . GREEN LIGHT LASER TURP (TRANSURETHRAL RESECTION OF PROSTATE  2016   done in [place]     Prior to Admission medications   Medication Sig Start Date End Date Taking? Authorizing Provider  acetaminophen (TYLENOL) 325 MG tablet Take 650 mg by mouth every 6 (six) hours as needed.    [provider]  albuterol (PROVENTIL HFA;VENTOLIN HFA) 108 (90 Base) MCG/ACT inhaler Inhale 2 puffs into the lungs every 4 (four) hours as needed for shortness of breath.    [provider]  budesonide (PULMICORT) 0.5 MG/2ML nebulizer solution Inhale 2 mLs into the lungs daily. 09/26/16   [provider]  carvedilol (COREG) 6.25 MG tablet Take 6.25 mg by mouth 2 (two) times daily.    [provider]  clonazePAM (KLONOPIN) 1 MG tablet Take 1 mg by mouth 3 (three) times daily.     [provider]  clopidogrel (PLAVIX) 75 MG tablet Take 75 mg by mouth daily.    [provider]  divalproex (DEPAKOTE ER) 250 MG 24 hr tablet Take 750 mg by mouth 2 (two) times daily.     [provider]  ENTRESTO 24-26 MG TAKE 1 TABLET BY MOUTH TWICE DAILY 03/19/17   [name] A, FNP  furosemide (LASIX) 40 MG tablet Take 40  mg by mouth 2 (two) times daily.    [provider]  gabapentin (NEURONTIN) 300 MG capsule Take 600 mg by mouth 3 (three) times daily. Take 300 mg in the morning, 300 mg at 2 pm and 300 mg at night.    [provider]  hydrALAZINE (APRESOLINE) 25 MG tablet Take 25 mg by mouth 3 (three) times daily.    [provider]  ipratropium-albuterol (DUONEB) 0.5-2.5 (3) MG/3ML SOLN Take 3 mLs by nebulization every 6 (six) hours as needed (wheezing/shortness of breath).    [provider]  Melatonin 10 MG TABS Take 10 mg by mouth at bedtime.     [provider]  nitroGLYCERIN (NITROSTAT) 0.4 MG SL tablet  Place 0.4 mg under the tongue every 5 (five) minutes as needed for chest pain.    [provider]  omeprazole (PRILOSEC) 40 MG capsule Take 40 mg by mouth daily.    [provider]  rivaroxaban (XARELTO) 20 MG TABS tablet Take 20 mg by mouth at bedtime.    [provider]  rosuvastatin (CRESTOR) 20 MG tablet Take 20 mg by mouth daily.    [provider]  sacubitril-valsartan (ENTRESTO) 24-26 MG Take 1 tablet by mouth 2 (two) times daily. 01/21/17   Delma Freeze, FNP  tamsulosin (FLOMAX) 0.4 MG CAPS capsule TAKE ONE CAPSULE BY MOUTH EVERY DAY AFTER SUPPER 11/23/16   Vanna Scotland, MD  traMADol (ULTRAM) 50 MG tablet Take 50 mg by mouth every 6 (six) hours as needed for moderate pain or severe pain.     [provider]  traZODone (DESYREL) 50 MG tablet Take 1 tablet (50 mg total) by mouth at bedtime. 03/06/17   Delfino Lovett, MD  vortioxetine HBr (TRINTELLIX) 5 MG TABS Take by mouth.    [provider]    Allergies Other and Codeine  Family History  Problem Relation Age of Onset  . Other Father        Cerebral hemorrhage  . Kidney cancer Neg Hx   . Kidney disease Neg Hx   . Prostate cancer Neg Hx     Social History Social History  Substance Use Topics  . Smoking status: Former Smoker    Types: Cigarettes    Quit date: 08/2015  . Smokeless tobacco: Never Used  . Alcohol use Yes    Review of Systems  Constitutional: No fever/chills Eyes: No visual changes. ENT: No sore throat. Cardiovascular: Denies chest pain. Respiratory: shortness of breath. Gastrointestinal: No abdominal pain.  No nausea, no vomiting.  No diarrhea.  No constipation. Genitourinary: Negative for dysuria. Musculoskeletal: Negative for back pain. Skin: Negative for rash. Neurological: Negative for headaches, focal weakness  ____________________________________________   PHYSICAL EXAM:  VITAL SIGNS: ED Triage Vitals  Enc Vitals Group     BP 03/22/17  1750 125/73     Pulse Rate 03/22/17 1750 73     Resp 03/22/17 1750 18     Temp 03/22/17 1750 97.9 F (36.6 C)     Temp Source 03/22/17 1750 Oral     SpO2 03/22/17 1750 100 %     Weight 03/22/17 1751 270 lb (122.5 kg)     Height 03/22/17 1751  (1.727 m)     Head Circumference --      Peak Flow --      Pain Score --      Pain Loc --      Pain Edu? --      Excl.  in GC? --     Constitutional: Alert and oriented. Well appearing and in no acute distress. Eyes: Conjunctivae are normal. PERRL. EOMI. Head: Atraumatic. Nose: No congestion/rhinnorhea. Mouth/Throat: Mucous membranes are moist.  Oropharynx non-erythematous. Neck: No stridor. Cardiovascular: Normal rate, regular rhythm. Grossly normal heart sounds.  Good peripheral circulation. Respiratory: Normal respiratory effort.  No retractions. Lungs CTAB. Gastrointestinal: Soft and nontender. No distention. No abdominal bruits. No CVA tenderness. Musculoskeletal: No lower extremity tenderness 2+ edema.  No joint effusions. Neurologic:  Normal speech and language. No gross focal neurologic deficits are appreciated.  Skin:  Skin is warm, dry and intact. No rash noted. Psychiatric: Mood and affect are normal. Speech and behavior are normal.  ____________________________________________   LABS (all labs ordered are listed, but only abnormal results are displayed)  Labs Reviewed  CBC - Abnormal; Notable for the following:       Result Value   RBC 4.30 (*)    Hemoglobin 12.9 (*)    HCT 38.5 (*)    RDW 16.7 (*)    All other components within normal limits  COMPREHENSIVE METABOLIC PANEL - Abnormal; Notable for the following:    BUN 25 (*)    Albumin 3.3 (*)    ALT 13 (*)    Alkaline Phosphatase 37 (*)    All other components within normal limits  BRAIN NATRIURETIC PEPTIDE - Abnormal; Notable for the following:    B Natriuretic Peptide 259.0 (*)    All other components within normal limits  TROPONIN I    ____________________________________________  EKG  EKG read and interpreted by me fully paced ____________________________________________  RADIOLOGY  IMPRESSION: No acute cardiopulmonary disease.   Electronically Signed   By: Amie Portland M.D.   On: 03/22/2017 18:28 ____________________________________________   PROCEDURES  Procedure(s) performed:  Procedures  Critical Care performed:   ____________________________________________   INITIAL IMPRESSION / ASSESSMENT AND PLAN / ED COURSE  Pertinent labs & imaging results that were available during my care of the patient were reviewed by me and considered in my medical decision making (see chart for details).   Patient received 40 of Lasix IV  Up to go to the bbathroom Can barely make it to the toilet his legs are slightly red below the kneeleads me to believe he may be devel developing cellulitis     ____________________________________________   FINAL CLINICAL IMPRESSION(S) / ED DIAGNOSES  Final diagnoses:  Dyspnea, unspecified type  Systolic congestive heart failure, unspecified HF chronicity (HCC)  Cellulitis, unspecified cellulitis site      NEW MEDICATIONS STARTED DURING THIS VISIT:  New Prescriptions   No medications on file     Note:  This document was prepared using Dragon voice recognition software and may include unintentional dictation errors.    Arnaldo Natal, MD 03/22/17 2130

## 2017-03-22 NOTE — ED Notes (Signed)
IV nurse start 22g RAC however unable to draw. Lab called for draw.

## 2017-03-22 NOTE — ED Triage Notes (Signed)
Patient states SOB with exertion and increased pedal edema, denies chest pain.

## 2017-03-23 ENCOUNTER — Ambulatory Visit: Payer: BLUE CROSS/BLUE SHIELD

## 2017-03-23 ENCOUNTER — Other Ambulatory Visit: Payer: Self-pay | Admitting: Urology

## 2017-03-23 DIAGNOSIS — N359 Urethral stricture, unspecified: Secondary | ICD-10-CM

## 2017-03-24 ENCOUNTER — Ambulatory Visit: Payer: BLUE CROSS/BLUE SHIELD | Attending: Family | Admitting: Family

## 2017-03-24 ENCOUNTER — Encounter: Payer: Self-pay | Admitting: Family

## 2017-03-24 VITALS — BP 101/63 | HR 68 | Resp 18 | Ht 68.0 in | Wt 280.4 lb

## 2017-03-24 DIAGNOSIS — Z79899 Other long term (current) drug therapy: Secondary | ICD-10-CM | POA: Insufficient documentation

## 2017-03-24 DIAGNOSIS — Z7901 Long term (current) use of anticoagulants: Secondary | ICD-10-CM | POA: Diagnosis not present

## 2017-03-24 DIAGNOSIS — J449 Chronic obstructive pulmonary disease, unspecified: Secondary | ICD-10-CM | POA: Diagnosis not present

## 2017-03-24 DIAGNOSIS — I4891 Unspecified atrial fibrillation: Secondary | ICD-10-CM | POA: Insufficient documentation

## 2017-03-24 DIAGNOSIS — F32A Depression, unspecified: Secondary | ICD-10-CM

## 2017-03-24 DIAGNOSIS — E7800 Pure hypercholesterolemia, unspecified: Secondary | ICD-10-CM | POA: Diagnosis not present

## 2017-03-24 DIAGNOSIS — R531 Weakness: Secondary | ICD-10-CM | POA: Insufficient documentation

## 2017-03-24 DIAGNOSIS — L0390 Cellulitis, unspecified: Secondary | ICD-10-CM | POA: Diagnosis not present

## 2017-03-24 DIAGNOSIS — E785 Hyperlipidemia, unspecified: Secondary | ICD-10-CM | POA: Insufficient documentation

## 2017-03-24 DIAGNOSIS — I959 Hypotension, unspecified: Secondary | ICD-10-CM | POA: Insufficient documentation

## 2017-03-24 DIAGNOSIS — I110 Hypertensive heart disease with heart failure: Secondary | ICD-10-CM | POA: Insufficient documentation

## 2017-03-24 DIAGNOSIS — I5022 Chronic systolic (congestive) heart failure: Secondary | ICD-10-CM

## 2017-03-24 DIAGNOSIS — I252 Old myocardial infarction: Secondary | ICD-10-CM | POA: Diagnosis not present

## 2017-03-24 DIAGNOSIS — G4733 Obstructive sleep apnea (adult) (pediatric): Secondary | ICD-10-CM | POA: Diagnosis not present

## 2017-03-24 DIAGNOSIS — F329 Major depressive disorder, single episode, unspecified: Secondary | ICD-10-CM | POA: Insufficient documentation

## 2017-03-24 DIAGNOSIS — K219 Gastro-esophageal reflux disease without esophagitis: Secondary | ICD-10-CM | POA: Insufficient documentation

## 2017-03-24 DIAGNOSIS — Z9981 Dependence on supplemental oxygen: Secondary | ICD-10-CM | POA: Diagnosis not present

## 2017-03-24 DIAGNOSIS — Z87891 Personal history of nicotine dependence: Secondary | ICD-10-CM | POA: Insufficient documentation

## 2017-03-24 DIAGNOSIS — F419 Anxiety disorder, unspecified: Secondary | ICD-10-CM

## 2017-03-24 DIAGNOSIS — I950 Idiopathic hypotension: Secondary | ICD-10-CM

## 2017-03-24 DIAGNOSIS — L03116 Cellulitis of left lower limb: Secondary | ICD-10-CM

## 2017-03-24 MED ORDER — AMOXICILLIN-POT CLAVULANATE 875-125 MG PO TABS: 1.0000 | ORAL_TABLET | Freq: Two times a day (BID) | ORAL | 0 refills | Status: AC

## 2017-03-24 MED ORDER — FUROSEMIDE 40 MG PO TABS: 40.0000 mg | ORAL_TABLET | Freq: Two times a day (BID) | ORAL | 0 refills | Status: DC

## 2017-03-24 NOTE — Progress Notes (Signed)
Patient ID: Kevin Pearson, male    DOB: [birth year], 66 y.o.   MRN: [id number]  HPI  Kevin Pearson is a 66 y/o male with a history of GERD, anxiety, atrial fibrillation, depression, COPD (chronic O2), TBI, MI (multiple stents), hepatitis, hyperlipidemia, HTN, obstructive sleep apnea (without CPAP), remote tobacco/drug/alcohol use and chronic heart failure.   Reviewed last echo report on 02/14/16 which showed an EF of 35%.   Was in the ED 03/22/17 due to HF exacerbation and cellulitis. Given IV lasix and antibiotics and released home. Admitted 03/05/17 due to COPD, weakness and HF. Initially needed nebulizers, steroids and IV diuretics. Discharged home the next day. Was in the ED 12/14/16 due to back pain after a mechanical fall. Treated and released. Was in the ED 10/06/16 due to hemoptysis. Evaluated and discharged home. ED visit on 07/27/16 due to chest pain. Troponins were negative and he was discharged home. Was in the ED 07/02/16 due to bladder spasms. Treated and discharged home. Admitted 06/24/16 due to urinary retention. Had urethral dilation done and was discharged the next day.   He presents today for a follow-up visit after a recent ED visit with a chief complaint of worsening pedal edema. He says that he's chronically had swelling in his legs for many years but it seems to be worse over the last few days or so. Has associated fatigue, shortness of breath, abdominal distention, vomiting, diarrhea and weight gain along with this. Denies any chest pain or palpitations.  Past Medical History:  Diagnosis Date  . Acid reflux   . Anxiety   . Arthritis   . Atrial fibrillation (HCC)   . CHF (congestive heart failure) (HCC)   . Chronic orthostatic hypotension   . Clotting disorder (HCC)   . COPD (chronic obstructive pulmonary disease) (HCC)   . Depression   . Elevated PSA   . Heart attack (HCC)   . Heart disease   . Heart failure (HCC)   . Hepatitis   . High cholesterol   . Hypertension   . Sleep  apnea   . TBI (traumatic brain injury) (HCC)   . Urinary retention    Past Surgical History:  Procedure Laterality Date  . BLADDER SURGERY    . cardiac stents    . CYSTOSCOPY WITH URETHRAL DILATATION N/A 06/25/2016   Procedure: CYSTOSCOPY WITH URETHRAL DILATATION;  Surgeon: Vanna Scotland, MD;  Location: [facility] ORS;  Service: Urology;  Laterality: N/A;  . EP IMPLANTABLE DEVICE     St. Jude BiV-ICD  . GREEN LIGHT LASER TURP (TRANSURETHRAL RESECTION OF PROSTATE  2016   done in [facility]    Family History  Problem Relation Age of Onset  . Other Father        Cerebral hemorrhage  . Kidney cancer Neg Hx   . Kidney disease Neg Hx   . Prostate cancer Neg Hx    Social History  Substance Use Topics  . Smoking status: Former Smoker    Types: Cigarettes    Quit date: 08/2015  . Smokeless tobacco: Never Used  . Alcohol use Yes   Allergies  Allergen Reactions  . Other Anaphylaxis and Itching    Mango skin  . Codeine Nausea And Vomiting   Prior to Admission medications   Medication Sig Start Date End Date Taking? Authorizing Provider  acetaminophen (TYLENOL) 325 MG tablet Take 650 mg by mouth every 6 (six) hours as needed.   Yes [provider]  albuterol (PROVENTIL HFA;VENTOLIN HFA) 108 (90  Base) MCG/ACT inhaler Inhale 2 puffs into the lungs every 4 (four) hours as needed for shortness of breath.   Yes [provider]  budesonide (PULMICORT) 0.5 MG/2ML nebulizer solution Inhale 2 mLs into the lungs daily. 09/26/16  Yes [provider]  carvedilol (COREG) 6.25 MG tablet Take 6.25 mg by mouth 2 (two) times daily.   Yes [provider]  clonazePAM (KLONOPIN) 1 MG tablet Take 1 mg by mouth 3 (three) times daily.    Yes [provider]  clopidogrel (PLAVIX) 75 MG tablet Take 75 mg by mouth daily.   Yes [provider]  divalproex (DEPAKOTE ER) 250 MG 24 hr tablet Take 750 mg by mouth 2 (two) times daily.    Yes [provider]  ENTRESTO  24-26 MG TAKE 1 TABLET BY MOUTH TWICE DAILY 03/19/17  Yes [name] A, FNP  furosemide (LASIX) 40 MG tablet Take 40 mg by mouth 2 (two) times daily.   Yes [provider]  gabapentin (NEURONTIN) 300 MG capsule Take 600 mg by mouth 3 (three) times daily. Take 300 mg in the morning, 300 mg at 2 pm and 300 mg at night.   Yes [provider]  hydrALAZINE (APRESOLINE) 25 MG tablet Take 25 mg by mouth 3 (three) times daily.   Yes [provider]  ipratropium-albuterol (DUONEB) 0.5-2.5 (3) MG/3ML SOLN Take 3 mLs by nebulization every 6 (six) hours as needed (wheezing/shortness of breath).   Yes [provider]  Melatonin 10 MG TABS Take 10 mg by mouth at bedtime.    Yes [provider]  nitroGLYCERIN (NITROSTAT) 0.4 MG SL tablet Place 0.4 mg under the tongue every 5 (five) minutes as needed for chest pain.   Yes [provider]  omeprazole (PRILOSEC) 40 MG capsule Take 40 mg by mouth daily.   Yes [provider]  rivaroxaban (XARELTO) 20 MG TABS tablet Take 20 mg by mouth at bedtime.   Yes [provider]  rosuvastatin (CRESTOR) 20 MG tablet Take 20 mg by mouth daily.   Yes [provider]  sacubitril-valsartan (ENTRESTO) 24-26 MG Take 1 tablet by mouth 2 (two) times daily. 01/21/17  Yes [name] A, FNP  tamsulosin (FLOMAX) 0.4 MG CAPS capsule TAKE ONE CAPSULE BY MOUTH EVERY DAY AFTER SUPPER 03/23/17  Yes McGowan, Carollee Herter A, PA-C  traZODone (DESYREL) 50 MG tablet Take 1 tablet (50 mg total) by mouth at bedtime. 03/06/17  Yes Delfino Lovett, MD  vortioxetine HBr (TRINTELLIX) 5 MG TABS Take by mouth.   Yes [provider]  amoxicillin-clavulanate (AUGMENTIN) 875-125 MG tablet Take 1 tablet by mouth 2 (two) times daily. Patient not taking: Reported on 03/24/2017 03/22/17 04/01/17  Arnaldo Natal, MD    Review of Systems  Constitutional: Positive for fatigue. Negative for appetite change.  HENT: Negative for congestion,  postnasal drip and sore throat.   Eyes: Negative.   Respiratory: Positive for shortness of breath. Negative for cough and chest tightness.   Cardiovascular: Positive for leg swelling. Negative for chest pain and palpitations.  Gastrointestinal: Positive for abdominal distention, diarrhea (yesterday) and vomiting (yesterday). Negative for abdominal pain.  Endocrine: Negative.   Genitourinary: Negative.   Musculoskeletal: Positive for arthralgias (bilateral foot pain). Negative for back pain.  Skin: Negative.   Allergic/Immunologic: Negative.   Neurological: Positive for light-headedness (when changing positions too quickly) and numbness (bilateral feet). Negative for dizziness and tremors.       Memory issues   Hematological: Negative  for adenopathy. Does not bruise/bleed easily.  Psychiatric/Behavioral: Positive for dysphoric mood. Negative for sleep disturbance (sleeping on 2 pillows; wearing oxygen at 2L around the clock) and suicidal ideas. The patient is nervous/anxious.    Vitals:   03/24/17 1041  BP: 101/63  Pulse: 68  Resp: 18  SpO2: 100%  Weight: 280 lb 6 oz (127.2 kg)  Height:  (1.727 m)   Wt Readings from Last 3 Encounters:  03/24/17 280 lb 6 oz (127.2 kg)  03/22/17 270 lb (122.5 kg)  03/16/17 277 lb 6 oz (125.8 kg)    Lab Results  Component Value Date   CREATININE 1.21 03/22/2017   CREATININE 1.36 (H) 03/06/2017   CREATININE 1.25 (H) 03/05/2017   Physical Exam  Constitutional: He is oriented to person, place, and time. He appears well-developed and well-nourished.  HENT:  Head: Normocephalic and atraumatic.  Neck: Normal range of motion. Neck supple. No JVD present.  Cardiovascular: Normal rate and regular rhythm.   Pulmonary/Chest: Effort normal. He has no wheezes. He has no rales.  Abdominal: Soft. He exhibits no distension. There is no tenderness.  Musculoskeletal: He exhibits edema (3+ pitting edema in bilateral lower legs). He exhibits no tenderness.   Neurological: He is alert and oriented to person, place, and time.  Skin: Skin is warm and dry.  Psychiatric: He has a normal mood and affect. His behavior is normal.  Nursing note and vitals reviewed.  Assessment & Plan:  1: Chronic heart failure with reduced ejection fraction- - NYHA class III - moderately fluid overloaded  - has been taking furosemide  BID; will add an additional  twice daily for the next 5 days  - has not been weighing daily. Reminded to weigh every morning, write the weight down and call for an overnight weight gain of >2 pounds or a weekly weight gain of >5 pounds.  - not adding salt to his food. Reviewed a  sodium diet as he admits that he tends to eat salty foods at times. Says that he tends to eat out often and has recently eaten [place] food. Says "I know it's full of salt but that's what I like to eat".  - continues to drink excessive fluids; counseled on the importance of keeping fluid intake to close to 48 ounces daily; says that he drinks 32 ounces of water daily along with 48 ounces of soda daily and says "I know it's too much" - saw cardiologist (Paraschos) 01/28/17 - not currently wearing compression socks & he was encouraged to elevate his legs as much as possible as well as resume wearing the socks - due to low BP, unable to titrate entresto at this time. - will check a BMP next week since adding additional furosemide  2: Hypotension- - BP low  - saw PCP (Heffington) 01/29/17 - BMP done 03/22/17 reviewed; sodium 139, potassium 4.1, GFR >60  3: Obstructive sleep apnea- - wears oxygen at 2-3L around the clock although was not wearing it today - unable to tolerate wearing CPAP due to anxiety/claustrophobia - saw pulmonologist Meredeth Ide) 12/16/16  4: Anxiety/depression- - continues to live on his own and is managing his medications - patient again says that he does not want [place] to speak with his daughter or son regarding anything  pertaining to his health - follows with psychiatry (Su) on a monthly basis - now getting medications bubble packed and says that it doesn't matter what day he takes, as it's the same every day;  discussed the importance of him taking the correct pack for the correct day to avoid confusion - discussed [facility] coming out and he says that he's willing to try them. Had [facility] for a short period but didn't like them calling late at night   5: Cellulitis-  - says that he lost the recently filled prescription for the antibiotic and has only taken 1 days worth - sent in a new prescription but advised patient that it may not be able to be filled since he just got it - reminded to take the antibiotic with food and not on an empty stomach; he thinks he took it on an empty stomach which caused the vomiting  Patient did not bring his medications nor a list. Each medication was verbally reviewed with the patient and he was encouraged to bring the bottles to every visit to confirm accuracy of list.  Return in 1 week or sooner for any questions/problems before then.

## 2017-03-24 NOTE — Patient Instructions (Addendum)
Begin weighing daily and call for an overnight weight gain of > 2 pounds or a weekly weight gain of >5 pounds.  Take an additional  furosemide (water pill) twice daily for the next 5 days.

## 2017-03-25 DIAGNOSIS — L0390 Cellulitis, unspecified: Secondary | ICD-10-CM | POA: Insufficient documentation

## 2017-03-26 ENCOUNTER — Other Ambulatory Visit: Payer: Self-pay

## 2017-03-26 DIAGNOSIS — I5022 Chronic systolic (congestive) heart failure: Secondary | ICD-10-CM

## 2017-03-29 ENCOUNTER — Telehealth: Payer: Self-pay

## 2017-03-29 NOTE — Telephone Encounter (Signed)
Mr. Fauth contacted the office to update [place] on his weights.   Saturday: 275.2lbs Sunday: 264 lb Today :260 Lb  He states that he feels much better. He has 4 days left of his antibiotics. He would like to know if he should continue taking them and I advised that he should finish the course of antibiotics even if he is feeling better. He also sates that he has 4 duiertics left as well and would like to know if he should continue to take those.   With the loss of weight he has already experienced I advised him that I would check with the provider and let him know if he should continue taking them or hold them.   Please advise

## 2017-03-30 NOTE — Telephone Encounter (Signed)
Spoke with patient and re-iterated that he needed to finish taking his antibiotics.  Says that he hasn't weighed himself yet today. Advised him to finish out his increased dose of furosemide and then return to his original dose of  twice daily (had been taking  twice daily).   Confirmed that he will be coming in for his appointment tomorrow and will get labs at that time. Reminded him to bring medications with him. Patient verbalized understanding.

## 2017-03-31 ENCOUNTER — Ambulatory Visit: Payer: BLUE CROSS/BLUE SHIELD | Attending: Family | Admitting: Family

## 2017-03-31 ENCOUNTER — Encounter: Payer: Self-pay | Admitting: Family

## 2017-03-31 VITALS — BP 90/71 | HR 70 | Resp 18 | Ht 68.0 in | Wt 277.0 lb

## 2017-03-31 DIAGNOSIS — I959 Hypotension, unspecified: Secondary | ICD-10-CM | POA: Insufficient documentation

## 2017-03-31 DIAGNOSIS — I110 Hypertensive heart disease with heart failure: Secondary | ICD-10-CM | POA: Insufficient documentation

## 2017-03-31 DIAGNOSIS — F418 Other specified anxiety disorders: Secondary | ICD-10-CM | POA: Insufficient documentation

## 2017-03-31 DIAGNOSIS — F32A Depression, unspecified: Secondary | ICD-10-CM

## 2017-03-31 DIAGNOSIS — I509 Heart failure, unspecified: Secondary | ICD-10-CM | POA: Insufficient documentation

## 2017-03-31 DIAGNOSIS — Z9889 Other specified postprocedural states: Secondary | ICD-10-CM | POA: Insufficient documentation

## 2017-03-31 DIAGNOSIS — L0390 Cellulitis, unspecified: Secondary | ICD-10-CM | POA: Diagnosis not present

## 2017-03-31 DIAGNOSIS — Z79899 Other long term (current) drug therapy: Secondary | ICD-10-CM | POA: Insufficient documentation

## 2017-03-31 DIAGNOSIS — I5022 Chronic systolic (congestive) heart failure: Secondary | ICD-10-CM

## 2017-03-31 DIAGNOSIS — I950 Idiopathic hypotension: Secondary | ICD-10-CM

## 2017-03-31 DIAGNOSIS — Z87891 Personal history of nicotine dependence: Secondary | ICD-10-CM | POA: Insufficient documentation

## 2017-03-31 DIAGNOSIS — G4733 Obstructive sleep apnea (adult) (pediatric): Secondary | ICD-10-CM | POA: Diagnosis not present

## 2017-03-31 DIAGNOSIS — F419 Anxiety disorder, unspecified: Secondary | ICD-10-CM

## 2017-03-31 DIAGNOSIS — F329 Major depressive disorder, single episode, unspecified: Secondary | ICD-10-CM

## 2017-03-31 DIAGNOSIS — L03116 Cellulitis of left lower limb: Secondary | ICD-10-CM

## 2017-03-31 DIAGNOSIS — J449 Chronic obstructive pulmonary disease, unspecified: Secondary | ICD-10-CM | POA: Diagnosis not present

## 2017-03-31 LAB — BASIC METABOLIC PANEL
Anion gap: 12 (ref 5–15)
BUN: 23 mg/dL — ABNORMAL HIGH (ref 6–20)
CO2: 28 mmol/L (ref 22–32)
Calcium: 9.3 mg/dL (ref 8.9–10.3)
Chloride: 97 mmol/L — ABNORMAL LOW (ref 101–111)
Creatinine, Ser: 1.65 mg/dL — ABNORMAL HIGH (ref 0.61–1.24)
GFR calc Af Amer: 49 mL/min — ABNORMAL LOW (ref >60)
GFR calc non Af Amer: 42 mL/min — ABNORMAL LOW (ref >60)
Glucose, Bld: 89 mg/dL (ref 65–99)
Potassium: 3.6 mmol/L (ref 3.5–5.1)
Sodium: 137 mmol/L (ref 135–145)

## 2017-03-31 NOTE — Progress Notes (Addendum)
Agree with pharmacist note below.  Physical exam and ROS were done by myself.  Will stop hydralazine due to low BP. Called [facility] to ask them to call patient to explain which pill is the hydralazine as patient didn't bring his medications and his medications are currently being bubble packed.   Has compression socks but says that they are too tight. Will see if [facility] can fit patient or we will need to sent to [facility].   Getting a BMP today. Return in 2 weeks or sooner for any questions/problems before then.  [name], FNP HF Clinic at [facility]    Patient ID: Kevin Pearson, male    DOB: [birth year], 66 y.o.   MRN: [id number]  HPI  Kevin Pearson is a 66 y/o male with a history of GERD, anxiety, atrial fibrillation, depression, COPD (chronic O2), TBI, MI (multiple stents), hepatitis, hyperlipidemia, HTN, obstructive sleep apnea (without CPAP), remote tobacco/drug/alcohol use and chronic heart failure.   Reviewed last echo report on 02/14/16 which showed an EF of 35%.   Was in the ED 03/22/17 due to HF exacerbation and cellulitis. Given IV lasix and antibiotics and released home. Admitted 03/05/17 due to COPD, weakness and HF. Initially needed nebulizers, steroids and IV diuretics. Discharged home the next day. Was in the ED 12/14/16 due to back pain after a mechanical fall. Treated and released. Was in the ED 10/06/16 due to hemoptysis. Evaluated and discharged home. ED visit on 07/27/16 due to chest pain. Troponins were negative and he was discharged home. Was in the ED 07/02/16 due to bladder spasms. Treated and discharged home. Admitted 06/24/16 due to urinary retention. Had urethral dilation done and was discharged the next day.   He presents today for a follow-up visit after a recent ED visit with a chief complaint of pain in upper left back and some leg swelling that has gotten better with additional doses of Lasix. He says that he's chronically had  swelling in his legs for many years. Has associated fatigue, shortness of breath, abdominal distention, vomiting, diarrhea and weight gain along with this.   Past Medical History:  Diagnosis Date  . Acid reflux   . Anxiety   . Arthritis   . Atrial fibrillation (HCC)   . CHF (congestive heart failure) (HCC)   . Chronic orthostatic hypotension   . Clotting disorder (HCC)   . COPD (chronic obstructive pulmonary disease) (HCC)   . Depression   . Elevated PSA   . Heart attack (HCC)   . Heart disease   . Heart failure (HCC)   . Hepatitis   . High cholesterol   . Hypertension   . Sleep apnea   . TBI (traumatic brain injury) (HCC)   . Urinary retention    Past Surgical History:  Procedure Laterality Date  . BLADDER SURGERY    . cardiac stents    . CYSTOSCOPY WITH URETHRAL DILATATION N/A 06/25/2016   Procedure: CYSTOSCOPY WITH URETHRAL DILATATION;  Surgeon: Vanna Scotland, MD;  Location: [facility] ORS;  Service: Urology;  Laterality: N/A;  . EP IMPLANTABLE DEVICE     St. Jude BiV-ICD  . GREEN LIGHT LASER TURP (TRANSURETHRAL RESECTION OF PROSTATE  2016   done in [facility]    Family History  Problem Relation Age of Onset  . Other Father        Cerebral hemorrhage  . Kidney cancer Neg Hx   . Kidney disease Neg Hx   .  Prostate cancer Neg Hx    Social History  Substance Use Topics  . Smoking status: Former Smoker    Types: Cigarettes    Quit date: 08/2015  . Smokeless tobacco: Never Used  . Alcohol use Yes   Allergies  Allergen Reactions  . Other Anaphylaxis and Itching    Mango skin  . Codeine Nausea And Vomiting   Prior to Admission medications   Medication Sig Start Date End Date Taking? Authorizing Provider  acetaminophen (TYLENOL) 325 MG tablet Take 650 mg by mouth every 6 (six) hours as needed.   Yes [provider]  albuterol (PROVENTIL HFA;VENTOLIN HFA) 108 (90 Base) MCG/ACT inhaler Inhale 2 puffs into the lungs every 4 (four) hours as needed for shortness of  breath.   Yes [provider]  budesonide (PULMICORT) 0.5 MG/2ML nebulizer solution Inhale 2 mLs into the lungs daily. 09/26/16  Yes [provider]  carvedilol (COREG) 6.25 MG tablet Take 6.25 mg by mouth 2 (two) times daily.   Yes [provider]  clonazePAM (KLONOPIN) 1 MG tablet Take 1 mg by mouth 3 (three) times daily.    Yes [provider]  clopidogrel (PLAVIX) 75 MG tablet Take 75 mg by mouth daily.   Yes [provider]  divalproex (DEPAKOTE ER) 250 MG 24 hr tablet Take 750 mg by mouth 2 (two) times daily.    Yes [provider]  ENTRESTO 24-26 MG TAKE 1 TABLET BY MOUTH TWICE DAILY 03/19/17  Yes [name] A, FNP  furosemide (LASIX) 40 MG tablet Take 40 mg by mouth 2 (two) times daily.   Yes [provider]  gabapentin (NEURONTIN) 300 MG capsule Take 600 mg by mouth 3 (three) times daily. Take 300 mg in the morning, 300 mg at 2 pm and 300 mg at night.   Yes [provider]  hydrALAZINE (APRESOLINE) 25 MG tablet Take 25 mg by mouth 3 (three) times daily.   Yes [provider]  ipratropium-albuterol (DUONEB) 0.5-2.5 (3) MG/3ML SOLN Take 3 mLs by nebulization every 6 (six) hours as needed (wheezing/shortness of breath).   Yes [provider]  Melatonin 10 MG TABS Take 10 mg by mouth at bedtime.    Yes [provider]  nitroGLYCERIN (NITROSTAT) 0.4 MG SL tablet Place 0.4 mg under the tongue every 5 (five) minutes as needed for chest pain.   Yes [provider]  omeprazole (PRILOSEC) 40 MG capsule Take 40 mg by mouth daily.   Yes [provider]  rivaroxaban (XARELTO) 20 MG TABS tablet Take 20 mg by mouth at bedtime.   Yes [provider]  rosuvastatin (CRESTOR) 20 MG tablet Take 20 mg by mouth daily.   Yes [provider]  sacubitril-valsartan (ENTRESTO) 24-26 MG Take 1 tablet by mouth 2 (two) times daily. 01/21/17  Yes [name] A, FNP  tamsulosin (FLOMAX)  0.4 MG CAPS capsule TAKE ONE CAPSULE BY MOUTH EVERY DAY AFTER SUPPER 03/23/17  Yes McGowan, Carollee Herter A, PA-C  traZODone (DESYREL) 50 MG tablet Take 1 tablet (50 mg total) by mouth at bedtime. 03/06/17  Yes Delfino Lovett, MD  vortioxetine HBr (TRINTELLIX) 5 MG TABS Take by mouth.   Yes [provider]  amoxicillin-clavulanate (AUGMENTIN) 875-125 MG tablet Take 1 tablet by mouth 2 (two) times daily. Patient not taking: Reported on 03/24/2017 03/22/17 04/01/17  Arnaldo Natal, MD    Review of Systems  Constitutional: Positive for fatigue. Negative for appetite change.  HENT: Negative for  congestion, postnasal drip and sore throat.   Eyes: Negative.   Respiratory: Positive for shortness of breath. Negative for cough and chest tightness.   Cardiovascular: Positive for leg swelling. Negative for chest pain and palpitations.  Gastrointestinal: Positive for abdominal distention, diarrhea (yesterday) and vomiting (yesterday). Negative for abdominal pain.  Endocrine: Negative.   Genitourinary: Negative.   Musculoskeletal: Positive for arthralgias (left shoulder pain). Negative for back pain.  Skin: Negative.   Allergic/Immunologic: Negative.   Neurological: Positive for light-headedness (when changing positions too quickly) and numbness (bilateral feet). Negative for dizziness and tremors.       Memory issues   Hematological: Negative for adenopathy. Does not bruise/bleed easily.  Psychiatric/Behavioral: Positive for dysphoric mood. Negative for sleep disturbance (sleeping on 2 pillows; wearing oxygen at 2L around the clock) and suicidal ideas. The patient is nervous/anxious.    Vitals:   03/31/17 0948  BP: 90/71  Pulse: 70  Resp: 18  SpO2: 100%  Weight: 277 lb (125.6 kg)  Height:  (1.727 m)   Wt Readings from Last 3 Encounters:  03/31/17 277 lb (125.6 kg)  03/24/17 280 lb 6 oz (127.2 kg)  03/22/17 270 lb (122.5 kg)    Lab Results  Component Value Date   CREATININE 1.21  03/22/2017   CREATININE 1.36 (H) 03/06/2017   CREATININE 1.25 (H) 03/05/2017   Physical Exam  Constitutional: He is oriented to person, place, and time. He appears well-developed and well-nourished.  HENT:  Head: Normocephalic and atraumatic.  Neck: Normal range of motion. Neck supple. No JVD present.  Cardiovascular: Normal rate and regular rhythm.   Pulmonary/Chest: Effort normal. He has no wheezes. He has no rales.  Abdominal: Soft. He exhibits no distension. There is no tenderness.  Musculoskeletal: He exhibits edema (3+ pitting edema in bilateral lower legs although softer). He exhibits no tenderness.  Neurological: He is alert and oriented to person, place, and time.  Skin: Skin is warm and dry.  Psychiatric: He has a normal mood and affect. His behavior is normal.  Nursing note and vitals reviewed.  Assessment & Plan:  1: Chronic heart failure with reduced ejection fraction- - NYHA class III - moderately fluid overloaded  - took additional furosemide  twice daily for 5 days; says this helped decrease the fluid in his legs by a lot  - has been weighing himself daily with scale on carpet. Instructed to move scale to a hard floor to get the most accurate reading. Reminded to weigh every morning, write the weight down and call for an overnight weight gain of >2 pounds or a weekly weight gain of >5 pounds.  - not adding salt to his food. Reviewed a  sodium diet as he admits that he tends to eat salty foods at times. - due to low BP, unable to titrate entresto at this time.  - saw cardiologist (Paraschos) 01/28/17 - Check BMP today   2: Hypotension- - BP low  - saw PCP (Heffington) 01/29/17 - BMP done 03/22/17 reviewed; sodium 139, potassium 4.1, GFR >60 - Will discontinue hydralazine due to low blood pressure.   3: Obstructive sleep apnea- - wears oxygen at 2-3L around the clock  - saw pulmonologist Meredeth Ide) 12/16/16  4: Anxiety/depression- - continues to live on his  own and is managing his medications - patient complains about daughter micro-managing his medications  - follows with psychiatry (Su) on a monthly basis - now getting medications bubble packed  - CMA is to call [facility] to  follow-up as the referral was placed 03/26/17 and patient hasn't heard from them yet   5: Cellulitis-  - On last day of Augmentin  - Was having N/V while taking   - reminded to take the antibiotic with food and not on an empty stomach; he thinks he took it on an empty stomach which caused the vomiting  Patient did not bring his medications nor a list. Each medication was verbally reviewed with the patient and he was encouraged to bring the bottles to every visit to confirm accuracy of list.   Check BMP today. Return in 2 weeks or sooner for any questions/problems before then.   Cleopatra Cedar, PharmD Pharmacy Resident  03/31/17

## 2017-03-31 NOTE — Patient Instructions (Signed)
Continue weighing daily and call for an overnight weight gain of > 2 pounds or a weekly weight gain of >5 pounds.  Stop taking hydralazine.

## 2017-04-01 ENCOUNTER — Telehealth: Payer: Self-pay | Admitting: Family

## 2017-04-01 NOTE — Telephone Encounter (Signed)
Spoke with patient regarding lab results that were obtained on 03/31/17. Potassium and sodium levels were normal. Renal function has declined some after patient was given extra furosemide for 5 days. He is now back to his normal dose of furosemide  twice daily.   Advised patient that we would recheck his lab work at his next appointment on 04/14/17 and patient was agreeable with this plan.

## 2017-04-02 ENCOUNTER — Telehealth: Payer: Self-pay | Admitting: Family

## 2017-04-02 MED ORDER — FUROSEMIDE 40 MG PO TABS: ORAL_TABLET | ORAL | 0 refills | Status: DC

## 2017-04-02 NOTE — Telephone Encounter (Signed)
Patient called to discuss his weight and edema in his legs.   He says that he's moved his scale to a concrete floor at his home and yesterday's weight was 272.2 pounds and today's weight was 272 pounds. Weight at last clinic visit was 277 pounds. Discussed that we wanted a gradual weight loss.   He says that his legs remain swollen but that they don't appear to be any worse than previously. Has asked [facility] about TED hose but he can't remember their response. He will ask them again.   Discussed that we could increase his furosemide again or add a booster (metolazone) but that his most recent BMP results showed a decline in renal function after taking extra diuretic for 5 days.   He also mentions that his legs are appearing to become reddened again like they were prior to being treated for cellulitis. He did finish the course of antibiotics. Advised him to call his PCP regarding the redness in his legs to see if he needed any further treatment for his cellulitis.   Based on weight decline and no worsening of his edema, we opted to not change his current diuretic therapy. He will continue taking his regular dose of furosemide  twice daily. Should he gain >2 pounds overnight over the weekend, he will double his furosemide to  twice daily. He will call us on 04/05/17 to update [place] on his weight & edema.  Patient says that he is very comfortable with this plan. He does ask that I send in 10 tablets of  furosemide in case he would need them.

## 2017-04-05 ENCOUNTER — Telehealth: Payer: Self-pay | Admitting: Family

## 2017-04-05 NOTE — Telephone Encounter (Signed)
Nicki Guadalajara from [facility] called to confirm furosemide dose. She says that he started the  furosemide twice daily yesterday and wanted to know how long he should continue it.  Weight today was 267.2 pounds Yesterday (04/04/17) was 268.2 pounds Weight 04/03/17 was 272.1 pounds.  Lucy Chris that he should continue the  twice daily furosemide for now. She will re-evaluate on 04/07/17 as well as draw a BMP to be run STAT on 04/07/17.  Has an appointment to see his PCP on 04/08/17.

## 2017-04-08 ENCOUNTER — Telehealth: Payer: Self-pay

## 2017-04-08 NOTE — Telephone Encounter (Signed)
Tricia with [facility] contacted the office to advise that patient was not taking his diuretic as directed. He has been taking 80 mg in the AM and 40 mg in the PM. He was advised to take 80 mg twice daily on 04/02/2017.  She would like to verify if he should continue to take 80 mg twice daily after review of lab work.   Spoke with [name] FNP, lab work was reviewed and she advises that he take 80 mg twice daily of his furosemide until his follow up in clinic.

## 2017-04-12 ENCOUNTER — Ambulatory Visit: Payer: BLUE CROSS/BLUE SHIELD | Admitting: Pharmacist

## 2017-04-12 DIAGNOSIS — Z79899 Other long term (current) drug therapy: Secondary | ICD-10-CM

## 2017-04-12 NOTE — Progress Notes (Signed)
Mr. Banton is here today to discuss Medicare Part D/Advantage plans as he will be 89 in December. He is currently living in [place] but may be moving to either [place] or [place]. He wanted to know if there were any Medicare Advantage plans that would be accepted in all 3 states.   Today with the assistance of our care manager, we filled out the online extra help application through Social Services to see if he will qualify for subsidy. This may take up to 4 weeks to process. He may qualify for partial LIS*, but it does not look like he will qualify for full LIS or MSP.  The patient was interested in an Advantage plan through [facility]. I was not able to tell him if [facility] was going to be valid in the two other states. I am only able to run his Medicare card number in www.medicare.gov with his current zip code. Also, each of his doctor's would need to be checked in those states to see if they accept that particular plan.   If he does qualify for subsidy, he is able to change his plan once each month as needed. If he moves out of state, he will have an opportunity to change plans as needed.  [facility] does not have access to sites other than [email or web address]/. We are not affiliated with any of the insurance companies. The plans are reviewed based on the patient's medications, health care needs, physicians, and costs associated with the plans.  PLAN: Patient was encouraged to reach out to his insurance agent for guidance as he will likely be moving before the year ends.  Once he receives a ruling on the subsidy from [facility], if he is interested in looking at plans for [place], I am able to assist.   *MSP = Medicare Savings Plan (Assists with Medicare A and B - premiums, deductibles, coinsurance and copayments) *LIS = Low Income Subsidy (Assists with Medicare Part D - premiums, deductibles and copayments)  Maretta Overdorf K. Joelene Millin, PharmD [facility]  Operations Coordinator [phone]

## 2017-04-14 ENCOUNTER — Encounter: Payer: Self-pay | Admitting: Family

## 2017-04-14 ENCOUNTER — Ambulatory Visit: Payer: BLUE CROSS/BLUE SHIELD | Attending: Family | Admitting: Family

## 2017-04-14 VITALS — BP 117/74 | HR 70 | Resp 18 | Ht 68.0 in | Wt 274.0 lb

## 2017-04-14 DIAGNOSIS — K219 Gastro-esophageal reflux disease without esophagitis: Secondary | ICD-10-CM | POA: Diagnosis not present

## 2017-04-14 DIAGNOSIS — Z79899 Other long term (current) drug therapy: Secondary | ICD-10-CM | POA: Insufficient documentation

## 2017-04-14 DIAGNOSIS — I110 Hypertensive heart disease with heart failure: Secondary | ICD-10-CM | POA: Insufficient documentation

## 2017-04-14 DIAGNOSIS — F329 Major depressive disorder, single episode, unspecified: Secondary | ICD-10-CM | POA: Diagnosis not present

## 2017-04-14 DIAGNOSIS — Z87891 Personal history of nicotine dependence: Secondary | ICD-10-CM | POA: Insufficient documentation

## 2017-04-14 DIAGNOSIS — I5022 Chronic systolic (congestive) heart failure: Secondary | ICD-10-CM | POA: Diagnosis not present

## 2017-04-14 DIAGNOSIS — J449 Chronic obstructive pulmonary disease, unspecified: Secondary | ICD-10-CM | POA: Diagnosis not present

## 2017-04-14 DIAGNOSIS — I252 Old myocardial infarction: Secondary | ICD-10-CM | POA: Insufficient documentation

## 2017-04-14 DIAGNOSIS — I959 Hypotension, unspecified: Secondary | ICD-10-CM | POA: Diagnosis not present

## 2017-04-14 DIAGNOSIS — Z7901 Long term (current) use of anticoagulants: Secondary | ICD-10-CM | POA: Insufficient documentation

## 2017-04-14 DIAGNOSIS — F32A Depression, unspecified: Secondary | ICD-10-CM

## 2017-04-14 DIAGNOSIS — F419 Anxiety disorder, unspecified: Secondary | ICD-10-CM | POA: Diagnosis not present

## 2017-04-14 DIAGNOSIS — Z9981 Dependence on supplemental oxygen: Secondary | ICD-10-CM | POA: Insufficient documentation

## 2017-04-14 DIAGNOSIS — G4733 Obstructive sleep apnea (adult) (pediatric): Secondary | ICD-10-CM

## 2017-04-14 DIAGNOSIS — I4891 Unspecified atrial fibrillation: Secondary | ICD-10-CM | POA: Diagnosis not present

## 2017-04-14 DIAGNOSIS — I890 Lymphedema, not elsewhere classified: Secondary | ICD-10-CM

## 2017-04-14 DIAGNOSIS — I950 Idiopathic hypotension: Secondary | ICD-10-CM

## 2017-04-14 MED ORDER — TORSEMIDE 20 MG PO TABS: 40.0000 mg | ORAL_TABLET | Freq: Two times a day (BID) | ORAL | 3 refills | Status: DC

## 2017-04-14 NOTE — Patient Instructions (Addendum)
Continue weighing daily and call for an overnight weight gain of > 2 pounds or a weekly weight gain of >5 pounds.  Stop taking hydralazine.  Stop taking furosemide and will begin torsemide  twice daily

## 2017-04-14 NOTE — Progress Notes (Signed)
Patient ID: Kevin Pearson, male    DOB: 1951/05/09, 66 y.o.   MRN: [id number]  HPI  Kevin Pearson is a 66 y/o male with a history of GERD, anxiety, atrial fibrillation, depression, COPD (chronic O2), TBI, MI (multiple stents), hepatitis, hyperlipidemia, HTN, obstructive sleep apnea (without CPAP), remote tobacco/drug/alcohol use and chronic heart failure.   Reviewed last echo report on 02/14/16 which showed an EF of 35%.   Was in the ED 03/22/17 due to HF exacerbation and cellulitis. Given IV lasix and antibiotics and released home. Admitted 03/05/17 due to COPD, weakness and HF. Initially needed nebulizers, steroids and IV diuretics. Discharged home the next day. Was in the ED 12/14/16 due to back pain after a mechanical fall. Treated and released. Was in the ED 10/06/16 due to hemoptysis. Evaluated and discharged home. ED visit on 07/27/16 due to chest pain. Troponins were negative and he was discharged home. Was in the ED 07/02/16 due to bladder spasms. Treated and discharged home. Admitted 06/24/16 due to urinary retention. Had urethral dilation done and was discharged the next day.   He presents today for a follow-up visit with a chief complaint of moderate fatigue with little exertion. He describes this as chronic in nature having been present for several years with varying levels of severity. He has associated shortness of breath, edema, light-headedness and anxiety along with this. He denies any chest pain, cough or palpitations. Gets his medications bubble packed by [employer]. Says that there's a strong possibility that he may move to [place].   Past Medical History:  Diagnosis Date  . Acid reflux   . Anxiety   . Arthritis   . Atrial fibrillation (HCC)   . CHF (congestive heart failure) (HCC)   . Chronic orthostatic hypotension   . Clotting disorder (HCC)   . COPD (chronic obstructive pulmonary disease) (HCC)   . Depression   . Elevated PSA   . Heart attack (HCC)   . Heart disease    . Heart failure (HCC)   . Hepatitis   . High cholesterol   . Hypertension   . Sleep apnea   . TBI (traumatic brain injury) (HCC)   . Urinary retention    Past Surgical History:  Procedure Laterality Date  . BLADDER SURGERY    . cardiac stents    . CYSTOSCOPY WITH URETHRAL DILATATION N/A 06/25/2016   Procedure: CYSTOSCOPY WITH URETHRAL DILATATION;  Surgeon: Vanna Scotland, MD;  Location: [facility] ORS;  Service: Urology;  Laterality: N/A;  . EP IMPLANTABLE DEVICE     St. Jude BiV-ICD  . GREEN LIGHT LASER TURP (TRANSURETHRAL RESECTION OF PROSTATE  2016   done in [facility]    Family History  Problem Relation Age of Onset  . Other Father        Cerebral hemorrhage  . Kidney cancer Neg Hx   . Kidney disease Neg Hx   . Prostate cancer Neg Hx    Social History  Substance Use Topics  . Smoking status: Former Smoker    Types: Cigarettes    Quit date: 08/2015  . Smokeless tobacco: Never Used  . Alcohol use Yes   Allergies  Allergen Reactions  . Other Anaphylaxis and Itching    Mango skin  . Codeine Nausea And Vomiting   Prior to Admission medications   Medication Sig Start Date End Date Taking? Authorizing Provider  acetaminophen (TYLENOL) 325 MG tablet Take 650 mg by mouth every 6 (six) hours as needed.   Yes [provider]  albuterol (PROVENTIL HFA;VENTOLIN HFA) 108 (90 Base) MCG/ACT inhaler Inhale 2 puffs into the lungs every 4 (four) hours as needed for shortness of breath.   Yes [provider]  budesonide (PULMICORT) 0.5 MG/2ML nebulizer solution Inhale 2 mLs into the lungs daily. 09/26/16  Yes [provider]  carvedilol (COREG) 6.25 MG tablet Take 6.25 mg by mouth 2 (two) times daily.   Yes [provider]  clonazePAM (KLONOPIN) 1 MG tablet Take 1 mg by mouth 3 (three) times daily.    Yes [provider]  clopidogrel (PLAVIX) 75 MG tablet Take 75 mg by mouth daily.   Yes [provider]  divalproex (DEPAKOTE ER) 250 MG 24 hr  tablet Take 750 mg by mouth 2 (two) times daily.    Yes [provider]  furosemide (LASIX) 40 MG tablet Take 40 mg by mouth 2 (two) times daily.   Yes [provider]  furosemide (LASIX) 40 MG tablet Should you have a weight gain >2 pounds overnight, take an additional  furosemide twice daily in addition to your current dose. 04/02/17  Yes Hackney, Inetta Fermo A, FNP  gabapentin (NEURONTIN) 300 MG capsule Take 600 mg by mouth 3 (three) times daily. Take 300 mg in the morning, 300 mg at 2 pm and 300 mg at night.   Yes [provider]  hydrALAZINE (APRESOLINE) 25 MG tablet Take 25 mg by mouth 3 (three) times daily.   Yes [provider]  ipratropium-albuterol (DUONEB) 0.5-2.5 (3) MG/3ML SOLN Take 3 mLs by nebulization every 6 (six) hours as needed (wheezing/shortness of breath).   Yes [provider]  Melatonin 10 MG TABS Take 10 mg by mouth at bedtime.    Yes [provider]  nitroGLYCERIN (NITROSTAT) 0.4 MG SL tablet Place 0.4 mg under the tongue every 5 (five) minutes as needed for chest pain.   Yes [provider]  omeprazole (PRILOSEC) 40 MG capsule Take 40 mg by mouth daily.   Yes [provider]  rivaroxaban (XARELTO) 20 MG TABS tablet Take 20 mg by mouth at bedtime.   Yes [provider]  rosuvastatin (CRESTOR) 20 MG tablet Take 20 mg by mouth daily.   Yes [provider]  sacubitril-valsartan (ENTRESTO) 24-26 MG Take 1 tablet by mouth 2 (two) times daily. 01/21/17  Yes [name] A, FNP  tamsulosin (FLOMAX) 0.4 MG CAPS capsule TAKE ONE CAPSULE BY MOUTH EVERY DAY AFTER SUPPER 03/23/17  Yes McGowan, Carollee Herter A, PA-C  traMADol (ULTRAM) 50 MG tablet Take 50 mg by mouth every 6 (six) hours as needed.   Yes [provider]  traZODone (DESYREL) 50 MG tablet Take 1 tablet (50 mg total) by mouth at bedtime. 03/06/17  Yes Delfino Lovett, MD  vortioxetine HBr (TRINTELLIX) 5 MG TABS Take by mouth.   Yes [provider]     Review of Systems  Constitutional: Positive for fatigue. Negative for appetite change.  HENT: Negative for congestion, postnasal drip and sore throat.   Eyes: Negative.   Respiratory: Positive for shortness of breath. Negative for cough and chest tightness.   Cardiovascular: Positive for leg swelling. Negative for chest pain and palpitations.  Gastrointestinal: Negative for abdominal distention and abdominal pain.  Endocrine: Negative.   Genitourinary: Negative.   Musculoskeletal: Positive for arthralgias (left shoulder pain). Negative for back pain.  Skin: Negative.   Allergic/Immunologic: Negative.   Neurological: Positive for light-headedness (when changing positions too quickly) and numbness (bilateral feet). Negative  for dizziness and tremors.       Memory issues   Hematological: Negative for adenopathy. Does not bruise/bleed easily.  Psychiatric/Behavioral: Positive for dysphoric mood. Negative for sleep disturbance (sleeping on 2 pillows; wearing oxygen at 2L at bedtime and PRN during the day) and suicidal ideas. The patient is nervous/anxious.    Vitals:   04/14/17 1105  BP: 117/74  Pulse: 70  Resp: 18  SpO2: 100%  Weight: 274 lb (124.3 kg)  Height:  (1.727 m)   Wt Readings from Last 3 Encounters:  04/14/17 274 lb (124.3 kg)  03/31/17 277 lb (125.6 kg)  03/24/17 280 lb 6 oz (127.2 kg)    Lab Results  Component Value Date   CREATININE 1.65 (H) 03/31/2017   CREATININE 1.21 03/22/2017   CREATININE 1.36 (H) 03/06/2017   Physical Exam  Constitutional: He is oriented to person, place, and time. He appears well-developed and well-nourished.  HENT:  Head: Normocephalic and atraumatic.  Neck: Normal range of motion. Neck supple. No JVD present.  Cardiovascular: Normal rate and regular rhythm.   Pulmonary/Chest: Effort normal. He has no wheezes. He has no rales.  Abdominal: Soft. He exhibits no distension. There is no tenderness.   Musculoskeletal: He exhibits edema (2+ pitting edema in bilateral lower legs although softer). He exhibits no tenderness.  Neurological: He is alert and oriented to person, place, and time.  Skin: Skin is warm and dry.  Psychiatric: He has a normal mood and affect. His behavior is normal.  Nursing note and vitals reviewed.  Assessment & Plan:  1: Chronic heart failure with reduced ejection fraction- - NYHA class III - moderately fluid overloaded although stable - has one more day left of taking furosemide  twice daily and then will decrease it back to  twice daily  - has moved his scale and continues to weigh daily.Reminded to weigh every morning, write the weight down and call for an overnight weight gain of >2 pounds or a weekly weight gain of >5 pounds.  - has lost 3 pounds since he was last here - not adding salt to his food. Reviewed a  sodium diet as he admits that he tends to eat salty foods at times. - due to low BP, unable to titrate entresto at this time.  - saw cardiologist (Paraschos) 01/28/17 - BMP from 04/07/17 (home health) reviewed and shows sodium 139, potassium 3.8 and GFR 65 - will change his furosemide to torsemide. Will begin torsemide  twice daily and hope to be able to decrease dosage over time.  - called [facility] as they are the ones that bubble pack his medications and the pharmacist says that the change will have to take effect in 2 weeks as his next 2 week pack just got shipped  2: Hypotension- - BP on the low side although better - saw PCP ([place]) 04/08/17 - hydralazine stopped at his last visit   3: Obstructive sleep apnea- - wears oxygen at 2 at bedtime and during the day as needed  - saw pulmonologist Meredeth Ide) 12/16/16  4: Anxiety/depression- - continues to live on his own and is managing his medications - patient complains about daughter micro-managing his medications  - follows with psychiatry (Su) on a monthly  basis  5: Lymphedema- - stage 2 - wearing compression socks daily but says that the edema is still present - does elevate his legs in a recliner during the day but says that the swelling persists - will make  a referral for lymphapress compression boots  Patient did not bring his medications nor a list. Each medication was verbally reviewed with the patient and he was encouraged to bring the bottles to every visit to confirm accuracy of list.   Return in 2 months or sooner for any questions/problems before then.

## 2017-04-19 ENCOUNTER — Ambulatory Visit: Payer: BLUE CROSS/BLUE SHIELD

## 2017-04-19 ENCOUNTER — Telehealth: Payer: Self-pay | Admitting: Family

## 2017-04-19 MED ORDER — FUROSEMIDE 40 MG PO TABS: 40.0000 mg | ORAL_TABLET | Freq: Two times a day (BID) | ORAL | 0 refills | Status: DC

## 2017-04-19 NOTE — Telephone Encounter (Signed)
Patient called to say that he's gained 3 pounds over the last 3 days. Says that he feels more short of breath than usual. Denies any chest pain. Has not received his torsemide yet due to medications begin bubble packed.  Will add an additional  of furosemide twice daily for the next 2 days. He is to call back on 04/21/17 to update on weight and shortness of breath. Patient verbalized understanding of medication change.

## 2017-04-20 ENCOUNTER — Telehealth: Payer: Self-pay

## 2017-04-20 NOTE — Telephone Encounter (Signed)
Nicki Guadalajara with [facility] contacted the office to clarify med change that [name] FNP made per telephone encounter on 04/19/2017.  I explained that per Inetta Fermo Mr. Winstanley should be taking an additional 40 mg of Furosemide yesterday and today and calling the office to update on his weight and breathing on Wednesday.   She verbalized understanding and will add him on for an additional visit on Friday of this week.

## 2017-04-20 NOTE — Telephone Encounter (Signed)
Kevin Pearson with [facility] contacted the office again this morning. She states that in further review of Kevin Pearson medications in the home, it looks as if his furosemide was removed from the bubble packs. The furosemide had been changed to  Torsemide tablets. She states that therefore unknowingly Kevin Pearson took 80 mg of Torsemide and an additional 80 mg of Furosemide.   He denies any cramping at this time and says that he is " peeing like a race horse". He does explain that he is very thirsty today and that his weight has only decreased by one pound since yesterday.   Per [name] patient is to stop taking the Furosemide that he has at the house and only continue to take his normal dose of Torsemide 40 mg twice daily. Kevin Pearson is to check blood work today to evaluate kidney function. She will also schedule an additional home visit on Thursday of this week. Blood work may need to be reevaluated at that visit based off of results of today's labs.

## 2017-05-14 ENCOUNTER — Telehealth: Payer: Self-pay | Admitting: Family

## 2017-05-14 NOTE — Telephone Encounter (Signed)
Nicki Guadalajararicia from [facility] called to say that patient's blood pressure has been running on the low side and he's experiencing dizziness. BP has been running 80-90/50-60. Weight has been stable.  He continues to take carvedilol 6.25mg  BID, entresto 24/26mg  BID and torsemide 40mg  BID. Continues to have edema in lower legs and is having difficulty wearing the compression boots twice daily because he's been so busy.   Instructed Nicki Guadalajararicia to have patient hold his entresto for now as he needs his diuretic due to continued edema and to encourage him to wear the compression boots twice daily for 1 hour each time to aid in reducing edema.   She will call back early next week to update us on his BP.

## 2017-06-07 ENCOUNTER — Ambulatory Visit: Payer: BLUE CROSS/BLUE SHIELD | Admitting: Family

## 2017-06-14 ENCOUNTER — Telehealth: Payer: Self-pay

## 2017-06-14 NOTE — Telephone Encounter (Signed)
Spoke with Mr. Kevin Pearson to r/s his appointment while on the phone with him he expressed concern about how he was doing.   He states that he is experiencing shortness of breath as well as edema. He states that the edema has been present for the past 2-3 days and has been getting worse. His legs are dry and flaky as well. He is no longer receiving home health services due to [place] recent trip [place] which disqualifies him as "homebound". He is also no longer preforming daily weights as he states "i'm having a nervous break" due to his plans to relocate to [place] as of the first of the year.   I spoke with Clarisa Kindredina Hackney FNP about his concerns. She advised that the patient should contact his PCP due to the current weather conditions as they may be able to open sooner than our clinic as well as the proximity to the patients home. She would like someone to be able to see him to assess his edema. She believes that the shortness of breath could be very much related to his COPD. Patient can also take an extra 40 mg of lasix if he has extra as he gets his medication bubble packed.   Advised patient of my conversation with Clarisa Kindredina Hackney and he verbalized understanding and he will place a call to his PCP's office.

## 2017-06-15 ENCOUNTER — Ambulatory Visit: Payer: BLUE CROSS/BLUE SHIELD | Admitting: Family

## 2017-06-21 ENCOUNTER — Encounter: Payer: Self-pay | Admitting: Family

## 2017-06-21 ENCOUNTER — Ambulatory Visit: Payer: Medicare Other | Attending: Family | Admitting: Family

## 2017-06-21 VITALS — BP 116/84 | HR 72 | Resp 18 | Ht 68.0 in | Wt 267.4 lb

## 2017-06-21 DIAGNOSIS — I4891 Unspecified atrial fibrillation: Secondary | ICD-10-CM | POA: Insufficient documentation

## 2017-06-21 DIAGNOSIS — Z7951 Long term (current) use of inhaled steroids: Secondary | ICD-10-CM | POA: Insufficient documentation

## 2017-06-21 DIAGNOSIS — J449 Chronic obstructive pulmonary disease, unspecified: Secondary | ICD-10-CM | POA: Diagnosis not present

## 2017-06-21 DIAGNOSIS — Z79899 Other long term (current) drug therapy: Secondary | ICD-10-CM | POA: Insufficient documentation

## 2017-06-21 DIAGNOSIS — F329 Major depressive disorder, single episode, unspecified: Secondary | ICD-10-CM | POA: Insufficient documentation

## 2017-06-21 DIAGNOSIS — I959 Hypotension, unspecified: Secondary | ICD-10-CM | POA: Diagnosis not present

## 2017-06-21 DIAGNOSIS — F32A Depression, unspecified: Secondary | ICD-10-CM

## 2017-06-21 DIAGNOSIS — I252 Old myocardial infarction: Secondary | ICD-10-CM | POA: Diagnosis not present

## 2017-06-21 DIAGNOSIS — I890 Lymphedema, not elsewhere classified: Secondary | ICD-10-CM | POA: Diagnosis not present

## 2017-06-21 DIAGNOSIS — M1990 Unspecified osteoarthritis, unspecified site: Secondary | ICD-10-CM | POA: Diagnosis not present

## 2017-06-21 DIAGNOSIS — E7800 Pure hypercholesterolemia, unspecified: Secondary | ICD-10-CM | POA: Diagnosis not present

## 2017-06-21 DIAGNOSIS — I110 Hypertensive heart disease with heart failure: Secondary | ICD-10-CM | POA: Diagnosis not present

## 2017-06-21 DIAGNOSIS — I5022 Chronic systolic (congestive) heart failure: Secondary | ICD-10-CM | POA: Diagnosis present

## 2017-06-21 DIAGNOSIS — Z9981 Dependence on supplemental oxygen: Secondary | ICD-10-CM | POA: Insufficient documentation

## 2017-06-21 DIAGNOSIS — Z87820 Personal history of traumatic brain injury: Secondary | ICD-10-CM | POA: Diagnosis not present

## 2017-06-21 DIAGNOSIS — I950 Idiopathic hypotension: Secondary | ICD-10-CM

## 2017-06-21 DIAGNOSIS — K219 Gastro-esophageal reflux disease without esophagitis: Secondary | ICD-10-CM | POA: Insufficient documentation

## 2017-06-21 DIAGNOSIS — Z87891 Personal history of nicotine dependence: Secondary | ICD-10-CM | POA: Diagnosis not present

## 2017-06-21 DIAGNOSIS — Z7901 Long term (current) use of anticoagulants: Secondary | ICD-10-CM | POA: Insufficient documentation

## 2017-06-21 DIAGNOSIS — Z955 Presence of coronary angioplasty implant and graft: Secondary | ICD-10-CM | POA: Diagnosis not present

## 2017-06-21 DIAGNOSIS — Z7902 Long term (current) use of antithrombotics/antiplatelets: Secondary | ICD-10-CM | POA: Diagnosis not present

## 2017-06-21 DIAGNOSIS — Z885 Allergy status to narcotic agent status: Secondary | ICD-10-CM | POA: Diagnosis not present

## 2017-06-21 DIAGNOSIS — F419 Anxiety disorder, unspecified: Secondary | ICD-10-CM

## 2017-06-21 DIAGNOSIS — G4733 Obstructive sleep apnea (adult) (pediatric): Secondary | ICD-10-CM

## 2017-06-21 NOTE — Progress Notes (Signed)
Patient ID: Kevin EngCarmen Pearson, male    DOB: 1950/10/31, 66 y.o.   MRN: [id number]  HPI  Kevin Pearson is a 66 y/o male with a history of GERD, anxiety, atrial fibrillation, depression, COPD (chronic O2), TBI, MI (multiple stents), hepatitis, hyperlipidemia, HTN, obstructive sleep apnea (without CPAP), remote tobacco/drug/alcohol use and chronic heart failure.   Reviewed last echo report on 02/14/16 which showed an EF of 35%.   Was in the ED 03/22/17 due to HF exacerbation and cellulitis. Given IV lasix and antibiotics and released home. Admitted 03/05/17 due to COPD, weakness and HF. Initially needed nebulizers, steroids and IV diuretics. Discharged home the next day. Was in the ED 12/14/16 due to back pain after a mechanical fall. Treated and released.   He presents today for a follow-up visit with a chief complaint of moderate shortness of breath upon minimal exertion. He describes this as chronic in nature having been present for several years. He has associated fatigue, chest tightness, light-headedness, edema and worsening depression along with this. He denies any chest pain, palpitations, abdominal pain or weight gain. He finished with [facility] but would like to see if they could resume.   Past Medical History:  Diagnosis Date  . Acid reflux   . Anxiety   . Arthritis   . Atrial fibrillation (HCC)   . CHF (congestive heart failure) (HCC)   . Chronic orthostatic hypotension   . Clotting disorder (HCC)   . COPD (chronic obstructive pulmonary disease) (HCC)   . Depression   . Elevated PSA   . Heart attack (HCC)   . Heart disease   . Heart failure (HCC)   . Hepatitis   . High cholesterol   . Hypertension   . Sleep apnea   . TBI (traumatic brain injury) (HCC)   . Urinary retention    Past Surgical History:  Procedure Laterality Date  . BLADDER SURGERY    . cardiac stents    . CYSTOSCOPY WITH URETHRAL DILATATION N/A 06/25/2016   Procedure: CYSTOSCOPY WITH URETHRAL DILATATION;   Surgeon: Vanna ScotlandAshley Brandon, MD;  Location: [facility] ORS;  Service: Urology;  Laterality: N/A;  . EP IMPLANTABLE DEVICE     St. Jude BiV-ICD  . GREEN LIGHT LASER TURP (TRANSURETHRAL RESECTION OF PROSTATE  2016   [facility]    Family History  Problem Relation Age of Onset  . Other Father        Cerebral hemorrhage  . Kidney cancer Neg Hx   . Kidney disease Neg Hx   . Prostate cancer Neg Hx    Social History   Tobacco Use  . Smoking status: Former Smoker    Types: Cigarettes    Last attempt to quit: 08/2015    Years since quitting: 1.8  . Smokeless tobacco: Never Used  Substance Use Topics  . Alcohol use: Yes   Allergies  Allergen Reactions  . Other Anaphylaxis and Itching    Mango skin  . Codeine Nausea And Vomiting   Prior to Admission medications   Medication Sig Start Date End Date Taking? Authorizing Provider  acetaminophen (TYLENOL) 325 MG tablet Take 650 mg by mouth every 6 (six) hours as needed.   Yes [provider]  albuterol (PROVENTIL HFA;VENTOLIN HFA) 108 (90 Base) MCG/ACT inhaler Inhale 2 puffs into the lungs every 4 (four) hours as needed for shortness of breath.   Yes [provider]  budesonide (PULMICORT) 0.5 MG/2ML nebulizer solution Inhale 2 mLs into the lungs daily. 09/26/16  Yes [provider]  carvedilol (COREG) 6.25 MG tablet Take 6.25 mg by mouth 2 (two) times daily.   Yes [provider]  cephALEXin (KEFLEX) 500 MG capsule Take 500 mg by mouth 3 (three) times daily.   Yes [provider]  clonazePAM (KLONOPIN) 1 MG tablet Take 1 mg by mouth 3 (three) times daily.    Yes [provider]  clopidogrel (PLAVIX) 75 MG tablet Take 75 mg by mouth daily.   Yes [provider]  divalproex (DEPAKOTE ER) 250 MG 24 hr tablet Take 750 mg by mouth 2 (two) times daily.    Yes [provider]  gabapentin (NEURONTIN) 300 MG capsule Take 600 mg by mouth 3 (three) times daily. Take 300 mg in the morning, 300  mg at 2 pm and 300 mg at night.   Yes [provider]  ipratropium-albuterol (DUONEB) 0.5-2.5 (3) MG/3ML SOLN Take 3 mLs by nebulization every 6 (six) hours as needed (wheezing/shortness of breath).   Yes [provider]  nitroGLYCERIN (NITROSTAT) 0.4 MG SL tablet Place 0.4 mg under the tongue every 5 (five) minutes as needed for chest pain.   Yes [provider]  omeprazole (PRILOSEC) 40 MG capsule Take 40 mg by mouth daily.   Yes [provider]  rivaroxaban (XARELTO) 20 MG TABS tablet Take 20 mg by mouth at bedtime.   Yes [provider]  rosuvastatin (CRESTOR) 20 MG tablet Take 20 mg by mouth daily.   Yes [provider]  sacubitril-valsartan (ENTRESTO) 24-26 MG Take 1 tablet by mouth 2 (two) times daily. 01/21/17  Yes Clarisa KindredHackney, Tina A, FNP  tamsulosin (FLOMAX) 0.4 MG CAPS capsule TAKE ONE CAPSULE BY MOUTH EVERY DAY AFTER SUPPER 03/23/17  Yes McGowan, Carollee HerterShannon A, PA-C  traMADol (ULTRAM) 50 MG tablet Take 50 mg by mouth every 6 (six) hours as needed.   Yes [provider]  traZODone (DESYREL) 50 MG tablet Take 1 tablet (50 mg total) by mouth at bedtime. 03/06/17  Yes Delfino LovettShah, Vipul, MD  vortioxetine HBr (TRINTELLIX) 5 MG TABS Take by mouth.   Yes [provider]  Melatonin 10 MG TABS Take 10 mg by mouth at bedtime.     [provider]  torsemide (DEMADEX) 20 MG tablet Take 2 tablets (40 mg total) by mouth 2 (two) times daily. 04/14/17 05/14/17  Delma FreezeHackney, Tina A, FNP   Review of Systems  Constitutional: Positive for fatigue. Negative for appetite change.  HENT: Negative for congestion, postnasal drip and sore throat.   Eyes: Negative.   Respiratory: Positive for chest tightness and shortness of breath. Negative for cough.   Cardiovascular: Positive for leg swelling. Negative for chest pain and palpitations.  Gastrointestinal: Negative for abdominal distention and abdominal pain.  Endocrine: Negative.   Genitourinary:  Negative.   Musculoskeletal: Positive for arthralgias (left shoulder pain). Negative for back pain.  Skin: Negative.   Allergic/Immunologic: Negative.   Neurological: Positive for light-headedness (when changing positions too quickly) and numbness (bilateral feet). Negative for dizziness and tremors.       Memory issues   Hematological: Negative for adenopathy. Does not bruise/bleed easily.  Psychiatric/Behavioral: Positive for dysphoric mood. Negative for sleep disturbance (sleeping on 2 pillows; wearing oxygen at 2L at bedtime and PRN during the day) and suicidal ideas. The patient is nervous/anxious.    Vitals:   06/21/17 1255  BP: 116/84  Pulse: 72  Resp: 18  SpO2: 100%  Weight: 267 lb 6 oz (121.3 kg)  Height: 5\' 8"  (1.727 m)   Wt Readings from Last 3 Encounters:  06/21/17 267 lb 6 oz (121.3 kg)  04/14/17 274 lb (124.3 kg)  03/31/17 277 lb (125.6 kg)    Lab Results  Component Value Date   CREATININE 1.65 (H) 03/31/2017   CREATININE 1.21 03/22/2017   CREATININE 1.36 (H) 03/06/2017   Physical Exam  Constitutional: He is oriented to person, place, and time. He appears well-developed and well-nourished.  HENT:  Head: Normocephalic and atraumatic.  Neck: Normal range of motion. Neck supple. No JVD present.  Cardiovascular: Normal rate and regular rhythm.  Pulmonary/Chest: Effort normal. He has no wheezes. He has no rales.  Abdominal: Soft. He exhibits no distension. There is no tenderness.  Musculoskeletal: He exhibits edema (2+ pitting edema in bilateral lower legs although softer). He exhibits no tenderness.  Neurological: He is alert and oriented to person, place, and time.  Skin: Skin is warm and dry.  Psychiatric: He has a normal mood and affect. His behavior is normal.  Nursing note and vitals reviewed.  Assessment & Plan:  1: Chronic heart failure with reduced ejection fraction- - NYHA class III - moderately fluid overloaded although stable - he thinks he's now  taking torsemide but unable to state for sure as his medications are still bubble packed by the pharmacy. - weighing daily.Reminded to weigh every morning, write the weight down and call for an overnight weight gain of >2 pounds or a weekly weight gain of >5 pounds.  - has lost 7 pounds since he was last here - not adding salt to his food. Reviewed a 2000mg  sodium diet as he admits that he tends to eat salty foods at times. - due to low BP, unable to titrate entresto at this time.  - saw cardiologist (Paraschos) 01/28/17 - BMP from 04/07/17 (home health) reviewed and shows sodium 139, potassium 3.8 and GFR 65 - will make another referral to [facility] - PharmD reconciled medications with the patient  2: Hypotension- - BP on the low side  - saw PCP (Heffington) 04/08/17  3: Obstructive sleep apnea- - wears oxygen at 2L at bedtime and during the day as needed  - saw pulmonologist Meredeth Ide) 12/16/16  4: Anxiety/depression- - continues to live on his own and is managing his medications - patient feels like his depression is worsening because of all the stress with possibly moving back to [place] - follows with psychiatry (Su) on a monthly basis  5: Lymphedema- - stage 2 - wearing compression socks daily but says that the edema is still present - does elevate his legs in a recliner during the day but says that the swelling persists - wearing lymphapress compression boots although not consistently. Says that he wears them ~ twice a week. Encouraged him to resume wearing them twice daily  Patient did not bring his medications nor a list. Each medication was verbally reviewed with the patient and he was encouraged to bring the bottles to every visit to confirm accuracy of list.   Return in 3 months or sooner for any questions/problems before then.

## 2017-06-21 NOTE — Patient Instructions (Signed)
Continue weighing daily and call for an overnight weight gain of > 2 pounds or a weekly weight gain of >5 pounds. 

## 2017-07-09 ENCOUNTER — Other Ambulatory Visit: Payer: Self-pay | Admitting: Family

## 2017-07-20 ENCOUNTER — Ambulatory Visit (INDEPENDENT_AMBULATORY_CARE_PROVIDER_SITE_OTHER): Payer: Medicare Other | Admitting: Urology

## 2017-07-20 ENCOUNTER — Encounter: Payer: Self-pay | Admitting: Urology

## 2017-07-20 VITALS — BP 117/69 | HR 69 | Ht 67.0 in | Wt 265.0 lb

## 2017-07-20 DIAGNOSIS — N320 Bladder-neck obstruction: Secondary | ICD-10-CM | POA: Diagnosis not present

## 2017-07-20 LAB — URINALYSIS, COMPLETE
Bilirubin, UA: NEGATIVE
Glucose, UA: NEGATIVE
Ketones, UA: NEGATIVE
Leukocytes, UA: NEGATIVE
Nitrite, UA: NEGATIVE
Protein, UA: NEGATIVE
RBC, UA: NEGATIVE
Specific Gravity, UA: 1.015 (ref 1.005–1.030)
Urobilinogen, Ur: 0.2 mg/dL (ref 0.2–1.0)
pH, UA: 7.5 (ref 5.0–7.5)

## 2017-07-20 LAB — BLADDER SCAN AMB NON-IMAGING

## 2017-07-20 MED ORDER — CEFTRIAXONE SODIUM 1 G IJ SOLR
1.0000 g | Freq: Once | INTRAMUSCULAR | Status: AC
  Administered 2017-07-20: 1 g via INTRAMUSCULAR

## 2017-07-20 NOTE — Progress Notes (Signed)
07/20/2017 10:35 AM   Kevin Pearson 01-Aug-1950 [id number]  Referring provider: Sharilyn SitesHeffington, Mark, MD [address] [facility] [place], [place] [id number]  Chief Complaint  Patient presents with  . Bladder Neck Contracture    HPI: 67 year old male with known severe bladder neck contracture managed with daily self cath to keep his bladder neck patent who returns the office today with difficulty voiding.  He reports that over the past several months, he has not been good about passing the catheter.  He has cut down to cathing only once a week and more recently is just stopped.  Was having increasing difficulty passing a 16 [place]French catheter.  Most recently, he was unsuccessful.    He reports that he is also began to experience decreased urinary stream and sensation of incomplete bladder emptying.    Denies any gross hematuria or dysuria.   PMH: Past Medical History:  Diagnosis Date  . Acid reflux   . Anxiety   . Arthritis   . Atrial fibrillation (HCC)   . CHF (congestive heart failure) (HCC)   . Chronic orthostatic hypotension   . Clotting disorder (HCC)   . COPD (chronic obstructive pulmonary disease) (HCC)   . Depression   . Elevated PSA   . Heart attack (HCC)   . Heart disease   . Heart failure (HCC)   . Hepatitis   . High cholesterol   . Hypertension   . Sleep apnea   . TBI (traumatic brain injury) (HCC)   . Urinary retention     Surgical History: Past Surgical History:  Procedure Laterality Date  . BLADDER SURGERY    . cardiac stents    . CYSTOSCOPY WITH URETHRAL DILATATION N/A 06/25/2016   Procedure: CYSTOSCOPY WITH URETHRAL DILATATION;  Surgeon: Vanna ScotlandAshley Lera Gaines, MD;  Location: [facility] ORS;  Service: Urology;  Laterality: N/A;  . EP IMPLANTABLE DEVICE     St. Jude BiV-ICD  . GREEN LIGHT LASER TURP (TRANSURETHRAL RESECTION OF PROSTATE  2016   [facility]     Home Medications:  Allergies as of 07/20/2017      Reactions   Other Anaphylaxis,  Itching   Mango skin   Codeine Nausea And Vomiting      Medication List        Accurate as of 07/20/17 11:59 PM. Always use your most recent med list.          acetaminophen 325 MG tablet Commonly known as:  TYLENOL Take 650 mg by mouth every 6 (six) hours as needed.   albuterol 108 (90 Base) MCG/ACT inhaler Commonly known as:  PROVENTIL HFA;VENTOLIN HFA Inhale 2 puffs into the lungs every 4 (four) hours as needed for shortness of breath.   budesonide 0.5 MG/2ML nebulizer solution Commonly known as:  PULMICORT Inhale 2 mLs into the lungs daily.   carvedilol 6.25 MG tablet Commonly known as:  COREG Take 6.25 mg by mouth 2 (two) times daily.   clonazePAM 1 MG tablet Commonly known as:  KLONOPIN Take 1 mg by mouth 3 (three) times daily.   clopidogrel 75 MG tablet Commonly known as:  PLAVIX Take 75 mg by mouth daily.   divalproex 250 MG 24 hr tablet Commonly known as:  DEPAKOTE ER Take 750 mg by mouth 2 (two) times daily.   furosemide 40 MG tablet Commonly known as:  LASIX   gabapentin 300 MG capsule Commonly known as:  NEURONTIN Take 600 mg by mouth 3 (three) times daily. Take 300 mg  in the morning, 300 mg at 2 pm and 300 mg at night.   ipratropium-albuterol 0.5-2.5 (3) MG/3ML Soln Commonly known as:  DUONEB Take 3 mLs by nebulization every 6 (six) hours as needed (wheezing/shortness of breath).   Melatonin 10 MG Tabs Take 10 mg by mouth at bedtime.   nitroGLYCERIN 0.4 MG SL tablet Commonly known as:  NITROSTAT Place 0.4 mg under the tongue every 5 (five) minutes as needed for chest pain.   omeprazole 40 MG capsule Commonly known as:  PRILOSEC Take 40 mg by mouth daily.   rosuvastatin 20 MG tablet Commonly known as:  CRESTOR Take 20 mg by mouth daily.   sacubitril-valsartan 24-26 MG Commonly known as:  ENTRESTO Take 1 tablet by mouth 2 (two) times daily.   tamsulosin 0.4 MG Caps capsule Commonly known as:  FLOMAX TAKE ONE CAPSULE BY MOUTH EVERY DAY  AFTER SUPPER   torsemide 20 MG tablet Commonly known as:  DEMADEX Take 2 tablets (40 mg total) by mouth 2 (two) times daily.   traMADol 50 MG tablet Commonly known as:  ULTRAM Take 50 mg by mouth every 6 (six) hours as needed.   traZODone 50 MG tablet Commonly known as:  DESYREL Take 1 tablet (50 mg total) by mouth at bedtime.   TRINTELLIX 5 MG Tabs Generic drug:  vortioxetine HBr Take by mouth.   XARELTO 20 MG Tabs tablet Generic drug:  rivaroxaban Take 20 mg by mouth at bedtime.       Allergies:  Allergies  Allergen Reactions  . Other Anaphylaxis and Itching    Mango skin  . Codeine Nausea And Vomiting    Family History: Family History  Problem Relation Age of Onset  . Other Father        Cerebral hemorrhage  . Kidney cancer Neg Hx   . Kidney disease Neg Hx   . Prostate cancer Neg Hx     Social History:  reports that he quit smoking about 2 years ago. His smoking use included cigarettes. he has never used smokeless tobacco. He reports that he drinks alcohol. He reports that he does not use drugs.  ROS: UROLOGY Frequent Urination?: Yes Hard to postpone urination?: No Burning/pain with urination?: No Get up at night to urinate?: Yes Leakage of urine?: No Urine stream starts and stops?: No Trouble starting stream?: No Do you have to strain to urinate?: No Blood in urine?: No Urinary tract infection?: No Sexually transmitted disease?: No Injury to kidneys or bladder?: No Painful intercourse?: No Weak stream?: No Erection problems?: No Penile pain?: No  Gastrointestinal Nausea?: No Vomiting?: No Indigestion/heartburn?: No Diarrhea?: No Constipation?: No  Constitutional Fever: No Night sweats?: No Weight loss?: No Fatigue?: No  Skin Skin rash/lesions?: No Itching?: Yes  Eyes Blurred vision?: No Double vision?: No  Ears/Nose/Throat Sore throat?: No Sinus problems?: Yes  Hematologic/Lymphatic Swollen glands?: No Easy bruising?:  Yes  Cardiovascular Leg swelling?: No Chest pain?: No  Respiratory Cough?: No Shortness of breath?: Yes  Endocrine Excessive thirst?: Yes  Musculoskeletal Back pain?: Yes Joint pain?: Yes  Neurological Headaches?: No Dizziness?: Yes  Psychologic Depression?: Yes Anxiety?: Yes  Physical Exam: BP 117/69   Pulse 69   Ht 5\' 7"  (1.702 m)   Wt 265 lb (120.2 kg)   BMI 41.50 kg/m   Constitutional:  Alert and oriented, No acute distress. HEENT: [place] AT, moist mucus membranes.  Trachea midline, no masses. Respiratory: Normal respiratory effort, no increased work of breathing. GI: Abdomen is  soft, nontender, nondistended, no abdominal masses GU: Circumcised phallus with bilateral descended testicles Skin: No rashes, bruises or suspicious lesions. Neurologic: Grossly intact, no focal deficits, moving all 4 extremities. Psychiatric: Normal mood and affect.  Procedure: Patient was prepped and draped in standard sterile fashion.  At this point in time, I attempted to pass several catheters including a 27 [place] as well as a 79 Jamaica unsuccessfully meeting resistance at the bladder neck.  At this point in time, he was moved over to a procedure room.  He was reprepped and draped.  A cystoscope was advanced per urethra to the bladder neck were very small pinpoint hole was identified at the bladder neck.  A Super Stiff wire was advanced into the bladder under direct visualization.  I then performed serial dilation with Cook dilators up to 18 [place].  There was significant buckling resistance of the bladder neck with each serial dilation and dilation was quite difficult and not particularly well-tolerated.  Ultimately, I was able to get a 12 Jamaica coud catheter converted into a Council tip through the stenotic bladder neck into the bladder with return of clear yellow urine.  The balloon was inflated with 10 cc.  I made several times that getting larger catheters and  unsuccessfully.  Laboratory Data: N/a  Urinalysis Component     Latest Ref Rng & Units 07/20/2017  Specific Gravity, UA     1.005 - 1.030 1.015  pH, UA     5.0 - 7.5 7.5  Color, UA     Yellow Yellow  Appearance Ur     Clear Clear  Leukocytes, UA     Negative Negative  Protein, UA     Negative/Trace Negative  Glucose     Negative Negative  Ketones, UA     Negative Negative  RBC, UA     Negative Negative  Bilirubin, UA     Negative Negative  Urobilinogen, Ur     0.2 - 1.0 mg/dL 0.2  Nitrite, UA     Negative Negative    Pertinent Imaging: N/a  Assessment & Plan:   1. Bladder neck contracture S/p office dilation up to 12 Fr today, not well tolerated Recommend return to office next week after passive dilation with upsizing over wire Goal to dilate to 16 Fr then back to daily self cath Patient is aggreable with this plan - Urinalysis, Complete - BLADDER SCAN AMB NON-IMAGING   Return in about 1 week (around 07/27/2017) for Foley exhange over a wire.  Vanna Scotland, MD  [facility] [address] [place], [place] [address] [phone]

## 2017-07-26 ENCOUNTER — Inpatient Hospital Stay: Admission: RE | Admit: 2017-07-26 | Payer: Medicare Other | Source: Ambulatory Visit

## 2017-07-27 ENCOUNTER — Ambulatory Visit: Admission: RE | Admit: 2017-07-27 | Payer: Medicare Other | Source: Ambulatory Visit | Admitting: Gastroenterology

## 2017-07-27 ENCOUNTER — Encounter: Admission: RE | Payer: Self-pay | Source: Ambulatory Visit

## 2017-07-27 SURGERY — ESOPHAGOGASTRODUODENOSCOPY (EGD) WITH PROPOFOL
Anesthesia: General

## 2017-07-29 ENCOUNTER — Telehealth: Payer: Self-pay | Admitting: Radiology

## 2017-07-29 NOTE — Telephone Encounter (Signed)
Pt called stating he has had burning & urine leakage since procedure done on Monday in office. Pt states he has called numerous times without a call back & doesn't want to have a cystoscopy without sedation. Please advise.

## 2017-07-29 NOTE — Telephone Encounter (Signed)
Per Dr Apolinar JunesBrandon, advised pt to keep appt on 07/30/17 to discuss his concerns. Pt voices understanding & has no further questions at this time.

## 2017-07-30 ENCOUNTER — Ambulatory Visit (INDEPENDENT_AMBULATORY_CARE_PROVIDER_SITE_OTHER): Payer: Medicare Other | Admitting: Urology

## 2017-07-30 ENCOUNTER — Encounter: Payer: Self-pay | Admitting: Urology

## 2017-07-30 VITALS — BP 145/85 | HR 72

## 2017-07-30 DIAGNOSIS — N3289 Other specified disorders of bladder: Secondary | ICD-10-CM | POA: Diagnosis not present

## 2017-07-30 DIAGNOSIS — N320 Bladder-neck obstruction: Secondary | ICD-10-CM | POA: Diagnosis not present

## 2017-07-30 NOTE — Progress Notes (Signed)
   07/30/17  CC: No chief complaint on file.   HPI: 67 year old male with history of bladder neck contracture who underwent urethral dilation and placement of a 12 French catheter last week.  He returned to the office this week for upsizing of his catheter over a wire.  He reports that he has had a really tough week.  He has had bladder spasms with a voiding around the catheter.  He is also been sore in his the perineal area.  He is very distraught and anxious about the procedure today.\  Fevers or chills.  No gross hematuria.  Blood pressure (!) 145/85, pulse 72. NED. A&Ox3.   No respiratory distress   Abd soft, NT, ND Normal phallus with bilateral descended testicles Foley catheter in place draining clear yellow urine.  Patient was prepped and draped in the standard surgical fashion including his current indwelling catheter.  This was then cannulated using a Super Stiff wire into the bladder.  Lidocaine jelly was placed.  The balloon was deflated and the catheter was removed leaving the wire in place.  A new 14 French council tip catheter was advanced over the wire easily through the bladder neck into the bladder.  The balloon was inflated with 10 cc of sterile water.  The wire was removed.  The patient was then cleaned and dried and the leg bag was situated on his right thigh.  He was administered a one-time dose of p.o. Cipro today.  Assessment/ Plan:  1. Bladder neck contracture Successfully upsized to 14 over a wire Recommend upsizing to 16 next week After this is remained in place for an additional week, I would like him to return to daily self cath to keep his bladder neck open Reviewed the plan extensively with the patient who was discouraged but ultimately agreeable  2. Bladder spasms Given samples of Myrbetriq 25 mg x7 to help with bladder spasms Should improve with upsizing of catheter today  Vanna ScotlandAshley Devell Parkerson, MD

## 2017-08-02 ENCOUNTER — Other Ambulatory Visit: Payer: Self-pay | Admitting: Family

## 2017-08-02 MED ORDER — TORSEMIDE 20 MG PO TABS: 40.0000 mg | ORAL_TABLET | Freq: Two times a day (BID) | ORAL | 3 refills | Status: DC

## 2017-08-06 ENCOUNTER — Encounter: Payer: Self-pay | Admitting: Urology

## 2017-08-06 ENCOUNTER — Ambulatory Visit (INDEPENDENT_AMBULATORY_CARE_PROVIDER_SITE_OTHER): Payer: Medicare Other | Admitting: Urology

## 2017-08-06 VITALS — BP 110/70 | HR 70 | Ht 67.0 in | Wt 260.0 lb

## 2017-08-06 DIAGNOSIS — N320 Bladder-neck obstruction: Secondary | ICD-10-CM

## 2017-08-06 NOTE — Progress Notes (Signed)
   08/06/17  CC:  Chief Complaint  Patient presents with  . Cysto    HPI: 67 year old male with history of bladder neck contracture who underwent urethral dilation and placement of a 12 French catheter 2 weeks ago.  This is subsequently upsized to 14 JamaicaFrench last week.  He returns today for upsizing to 16 JamaicaFrench over a wire today.  He continues to have multiple complaints today.  His complaints are not primarily urinary.  He complains of lower extremity edema and foot pain related to gout.  He is also had issues with his mood.  Fevers or chills.  No gross hematuria.  Blood pressure 110/70, pulse 70, height 5\' 7"  (1.702 m), weight 260 lb (117.9 kg). NED. A&Ox3.   No respiratory distress   Abd soft, NT, ND Normal phallus with bilateral descended testicles Foley catheter in place draining clear yellow urine.  Patient was prepped and draped in the standard surgical fashion including his current indwelling catheter.  This was then cannulated using a Super Stiff wire into the bladder.  Lidocaine jelly was placed.  The balloon was deflated and the catheter was removed leaving the wire in place.  I was unable to advance a regular 16 JamaicaFrench council tip but successful when converting coud-tip type catheter to a Council tip by modifying the tip.  This was able to be advanced with some difficulty and the balloon was inflated with 12 cc of sterile water.  The wire was removed.  The patient was then cleaned and dried and the leg bag was situated on his right thigh.  He was administered a one-time dose of p.o. Cipro today.  Assessment/ Plan:  1. Bladder neck contracture Successfully upsized to 16 over a wire with moderate difficulty Plan to have 16 French Foley catheter remain in place for an additional week Next week, we will plan on removing his catheter and resume CIC teaching with at least one demonstration prior to leaving the office to confirm that he is able to do this   Vanna ScotlandAshley Jamaris Theard,  MD

## 2017-08-13 ENCOUNTER — Ambulatory Visit: Payer: Medicare Other | Admitting: Urology

## 2017-08-13 ENCOUNTER — Encounter: Payer: Self-pay | Admitting: Urology

## 2017-08-20 ENCOUNTER — Telehealth: Payer: Self-pay | Admitting: Urology

## 2017-08-20 ENCOUNTER — Telehealth: Payer: Self-pay

## 2017-08-20 NOTE — Telephone Encounter (Signed)
Val from [facility] called stating pt has been admitted to their facility permeant. Val [facility] has been c/o wanting the foley removed. Read Val Dr. Delana MeyerBrandon's dictation of foley removal and resume CIC. Val stated that pt is not allowing the facility to perform CIC and protocols wont allow pt to do it himself. Val requested a first available f/u with Dr. Apolinar JunesBrandon. Appt made.

## 2017-08-22 NOTE — Telephone Encounter (Signed)
Until he is able to self cath again, we are not going to take out the Foley.  We will just have to wait until he is discharged from this facility if they are not willing to let him self cath himself.  It would only be once a day, but if he took out the catheter without cathing, his bladder neck would scar back down and we'd be right back where we started.    Please arrange for follow-up once he has been discharged from rehab.  Vanna ScotlandAshley Nael Petrosyan, MD

## 2017-08-23 ENCOUNTER — Telehealth: Payer: Self-pay

## 2017-08-23 NOTE — Telephone Encounter (Signed)
Pt called this morning stating he was going to be d/c home tomorrow from [facility] and wanted to come have his foley removed.  Spoke with Gavin Poundeborah at [facility], pt nurse today, who stated she is unaware of pt being d/c from facility. Gavin PoundDeborah did stated that if pt will allow it pt can be cath'd once daily.  Due to pt previously being confused and Gavin PoundDeborah not knowing anything about pt being d/c nurse called Charlynne PanderCara, pt daughter. Charlynne PanderCara stated that she is too under the impression pt will be d/c later on this week. Charlynne PanderCara also stated that pt is wanting foley removed and to be instructed again on CIC. Made Cara aware for her or pt to call once pt is actually d/c from facility and pt will be added to nurse schedule. Charlynne PanderCara voiced understanding of whole conversation. Called pt back. Pt did not answer therefore a message was left on vm with information of calling once he is d/c.

## 2017-08-23 NOTE — Telephone Encounter (Signed)
See previous note

## 2017-08-26 ENCOUNTER — Ambulatory Visit (INDEPENDENT_AMBULATORY_CARE_PROVIDER_SITE_OTHER): Payer: Medicare Other

## 2017-08-26 VITALS — BP 128/72 | HR 90 | Ht 68.0 in | Wt 260.0 lb

## 2017-08-26 DIAGNOSIS — N320 Bladder-neck obstruction: Secondary | ICD-10-CM

## 2017-08-26 NOTE — Progress Notes (Signed)
Catheter Removal  Patient is present today for a catheter removal.  10ml of water was drained from the balloon. A 16FR coude foley cath was removed from the bladder no complications were noted . Patient tolerated well.  Preformed by: Rupert Stackshelsea Watkins, LPN   Continuous Intermittent Catheterization  Due to bladder neck contracture patient is present today for a teaching of self I & O Catheterization. Patient was given detailed verbal and printed instructions of self catheterization. Patient was cleaned and prepped in a sterile fashion.  With instruction and assistance patient inserted a 16FR and urine return was noted as drops, urine was yellow  in color. Patient tolerated well, no complications were noted Patient was given a sample bag with supplies to take home.  Instructions were given per Dr. Apolinar JunesBrandon for patient to cath one times daily.    Preformed by: Rupert Stackshelsea Watkins, LPN   Blood pressure 128/72, pulse 90, height 5\' 8"  (1.727 m), weight 260 lb (117.9 kg).

## 2017-08-27 ENCOUNTER — Ambulatory Visit: Payer: Self-pay

## 2017-09-09 ENCOUNTER — Telehealth: Payer: Self-pay | Admitting: Family

## 2017-09-09 MED ORDER — POTASSIUM CHLORIDE CRYS ER 20 MEQ PO TBCR
20.0000 meq | EXTENDED_RELEASE_TABLET | Freq: Every day | ORAL | 0 refills | Status: DC
Start: 2017-09-09 — End: 2017-10-04

## 2017-09-09 MED ORDER — METOLAZONE 2.5 MG PO TABS: 2.5000 mg | ORAL_TABLET | Freq: Every day | ORAL | 0 refills | Status: DC

## 2017-09-09 NOTE — Telephone Encounter (Signed)
Sarah from [facility] called to see if she could get an order to teach patient to wrap his legs in ACE bandages due to the edema. She says that he currently has 2+ pitting edema in bilateral lower legs and is taking 60mg  furosemide twice daily. Not taking potassium supplements.   Will call in metolazone 2.5mg  #2 to take 1 daily times 2 doses without refills. Will also give 20meq potassium daily for 2 doses as well without refills.   Will check lab work on 09/20/17

## 2017-09-15 ENCOUNTER — Telehealth: Payer: Self-pay | Admitting: Urology

## 2017-09-15 NOTE — Telephone Encounter (Signed)
Patient is calling and asking for more catheters, please call and let him know if he can have more sent to him.  Marcelino DusterMichelle

## 2017-09-17 NOTE — Telephone Encounter (Signed)
Cath order placed. Pt stated he has enough for the weekend.

## 2017-09-19 NOTE — Progress Notes (Signed)
Patient ID: Collins Kerby, male    DOB: [birth year], 67 y.o.   MRN: [id number]  HPI  Mr Knoll is a 67 y/o male with a history of GERD, anxiety, atrial fibrillation, depression, COPD (chronic O2), TBI, MI (multiple stents), hepatitis, hyperlipidemia, HTN, obstructive sleep apnea (without CPAP), remote tobacco/drug/alcohol use and chronic heart failure.   Reviewed last echo report on 02/14/16 which showed an EF of 35%.   Admitted 08/10/17 due to confusion and UTI. Urology consult obtained and antibiotics were started. He was discharged after 7 days to SNF.          He presents today for a follow-up visit with a chief complaint of moderate shortness of breath upon minimal exertion. This has been present for several years with varying levels of severity. He has associated fatigue, chest tightness, edema, light-headedness, anxiety, weight gain and difficulty sleeping along with this. He denies any abdominal distention or cough. Has metolazone but hasn't taken it yet.   Past Medical History:  Diagnosis Date  . Acid reflux   . Anxiety   . Arthritis   . Atrial fibrillation (HCC)   . CHF (congestive heart failure) (HCC)   . Chronic orthostatic hypotension   . Clotting disorder (HCC)   . COPD (chronic obstructive pulmonary disease) (HCC)   . Depression   . Elevated PSA   . Heart attack (HCC)   . Heart disease   . Heart failure (HCC)   . Hepatitis   . High cholesterol   . Hypertension   . Sleep apnea   . TBI (traumatic brain injury) (HCC)   . Urinary retention    Past Surgical History:  Procedure Laterality Date  . BLADDER SURGERY    . cardiac stents    . CYSTOSCOPY WITH URETHRAL DILATATION N/A 06/25/2016   Procedure: CYSTOSCOPY WITH URETHRAL DILATATION;  Surgeon: Vanna Scotland, MD;  Location: [facility] ORS;  Service: Urology;  Laterality: N/A;  . EP IMPLANTABLE DEVICE     [employer] Jude BiV-ICD  . GREEN LIGHT LASER TURP (TRANSURETHRAL RESECTION OF PROSTATE  2016   done in [facility]    Family History   Problem Relation Age of Onset  . Other Father        Cerebral hemorrhage  . Kidney cancer Neg Hx   . Kidney disease Neg Hx   . Prostate cancer Neg Hx    Social History   Tobacco Use  . Smoking status: Former Smoker    Types: Cigarettes    Last attempt to quit: 08/2015    Years since quitting: 2.1  . Smokeless tobacco: Never Used  Substance Use Topics  . Alcohol use: Yes   Allergies  Allergen Reactions  . Other Anaphylaxis and Itching    Mango skin  . Codeine Nausea And Vomiting   Prior to Admission medications   Medication Sig Start Date End Date Taking? Authorizing Provider  acetaminophen (TYLENOL) 325 MG tablet Take 650 mg by mouth every 6 (six) hours as needed.   Yes [provider]  albuterol (PROVENTIL HFA;VENTOLIN HFA) 108 (90 Base) MCG/ACT inhaler Inhale 2 puffs into the lungs every 4 (four) hours as needed for shortness of breath.   Yes [provider]  budesonide (PULMICORT) 0.5 MG/2ML nebulizer solution Inhale 2 mLs into the lungs daily. 09/26/16  Yes [provider]  clopidogrel (PLAVIX) 75 MG tablet Take 75 mg by mouth daily.   Yes [provider]  divalproex (DEPAKOTE) 250 MG DR tablet Take 500 mg by mouth 2 (  two) times daily.  07/30/17  Yes [provider]  furosemide (LASIX) 40 MG tablet Take 60 mg by mouth 2 (two) times daily.  04/19/17  Yes [provider]  ipratropium-albuterol (DUONEB) 0.5-2.5 (3) MG/3ML SOLN Take 3 mLs by nebulization every 6 (six) hours as needed (wheezing/shortness of breath).   Yes [provider]  Melatonin 10 MG TABS Take 10 mg by mouth at bedtime.    Yes [provider]  metoprolol succinate (TOPROL-XL) 25 MG 24 hr tablet Take 25 mg by mouth daily.   Yes [provider]  nitroGLYCERIN (NITROSTAT) 0.4 MG SL tablet Place 0.4 mg under the tongue every 5 (five) minutes as needed for chest pain.   Yes [provider]  omeprazole (PRILOSEC) 40 MG capsule  Take 40 mg by mouth daily.   Yes [provider]  rivaroxaban (XARELTO) 20 MG TABS tablet Take 20 mg by mouth at bedtime.   Yes [provider]  rosuvastatin (CRESTOR) 20 MG tablet Take 20 mg by mouth daily.   Yes [provider]  senna (SENOKOT) 8.6 MG tablet Take 2 tablets by mouth 2 (two) times daily.   Yes [provider]  tamsulosin (FLOMAX) 0.4 MG CAPS capsule TAKE ONE CAPSULE BY MOUTH EVERY DAY AFTER SUPPER 03/23/17  Yes McGowan, Carollee Herter A, PA-C  traZODone (DESYREL) 50 MG tablet Take 1 tablet (50 mg total) by mouth at bedtime. 03/06/17  Yes Delfino Lovett, MD  vortioxetine HBr (TRINTELLIX) 5 MG TABS Take by mouth.   Yes [provider]  gabapentin (NEURONTIN) 300 MG capsule Take 600 mg by mouth 3 (three) times daily. Take 300 mg in the morning, 300 mg at 2 pm and 300 mg at night.    [provider]  metolazone (ZAROXOLYN) 2.5 MG tablet Take 1 tablet (2.5 mg total) by mouth daily. Patient not taking: Reported on 09/20/2017 09/09/17 12/08/17  [name] A, FNP  potassium chloride SA (K-DUR,KLOR-CON) 20 MEQ tablet Take 1 tablet (20 mEq total) by mouth daily. Patient not taking: Reported on 09/20/2017 09/09/17   Delma Freeze, FNP  sacubitril-valsartan (ENTRESTO) 24-26 MG Take 1 tablet by mouth 2 (two) times daily. Patient not taking: Reported on 09/20/2017 01/21/17   Delma Freeze, FNP    Review of Systems  Constitutional: Positive for fatigue. Negative for appetite change.  HENT: Negative for congestion, postnasal drip and sore throat.   Eyes: Negative.   Respiratory: Positive for chest tightness and shortness of breath. Negative for cough.   Cardiovascular: Positive for leg swelling. Negative for chest pain and palpitations.  Gastrointestinal: Negative for abdominal distention and abdominal pain.  Endocrine: Negative.   Genitourinary: Negative.   Musculoskeletal: Positive for arthralgias (left shoulder pain). Negative for back pain.  Skin:  Negative.   Allergic/Immunologic: Negative.   Neurological: Positive for light-headedness (when changing positions too quickly) and numbness (bilateral feet). Negative for dizziness and tremors.       Memory issues   Hematological: Negative for adenopathy. Does not bruise/bleed easily.  Psychiatric/Behavioral: Positive for dysphoric mood and sleep disturbance (sleeping on 2 pillows; wearing oxygen at 2L at bedtime and PRN during the day). Negative for suicidal ideas. The patient is nervous/anxious.    Vitals:   09/20/17 1303  BP: 108/63  Pulse: 70  Resp: 18  SpO2: 100%  Weight: 263 lb (119.3 kg)  Height: 5\' 7"  (1.702 m)   Wt Readings from Last 3 Encounters:  09/20/17 263 lb (119.3 kg)  08/26/17 260  lb (117.9 kg)  08/06/17 260 lb (117.9 kg)   Lab Results  Component Value Date   CREATININE 1.65 (H) 03/31/2017   CREATININE 1.21 03/22/2017   CREATININE 1.36 (H) 03/06/2017    Physical Exam  Constitutional: He is oriented to person, place, and time. He appears well-developed and well-nourished.  HENT:  Head: Normocephalic and atraumatic.  Neck: Normal range of motion. Neck supple. No JVD present.  Cardiovascular: Normal rate and regular rhythm.  Pulmonary/Chest: Effort normal. He has no wheezes. He has no rales.  Abdominal: Soft. He exhibits no distension. There is no tenderness.  Musculoskeletal: He exhibits edema (3+ pitting edema in bilateral lower legs although softer). He exhibits no tenderness.  Neurological: He is alert and oriented to person, place, and time.  Skin: Skin is warm and dry.  Psychiatric: He has a normal mood and affect. His behavior is normal.  Nursing note and vitals reviewed.  Assessment & Plan:  1: Acute on Chronic heart failure with reduced ejection fraction- - NYHA class III - moderately fluid overloaded  - taking furosemide 60mg  twice daily per his bubble pack - weighing daily.Reminded to weigh every morning, write the weight down and call for an  overnight weight gain of >2 pounds or a weekly weight gain of >5 pounds.  - weight up 3 pounds since 08/26/17 - instructed him to take the metolazone 2.5mg  daily for 2 days along with 20meq potassium for 2 days as well - not adding salt to his food. Reviewed a 2000mg  sodium diet as he admits that he tends to eat salty foods at times. - saw cardiologist (Paraschos) 06/30/17 - BMP from 03/31/17 reviewed and shows sodium 137, potassium 3.6 and GFR 42 - has had [facility] nursing recently  - going for initial pulmonary rehab appointment later today  2: Hypotension- - BP continues on the low side so will not re-initiate entresto at this time - saw PCP ([place]) 06/21/17  3: Obstructive sleep apnea- - wears oxygen at 2L at bedtime and during the day as needed  - saw pulmonologist Meredeth Ide([place]) 07/28/17  4: Anxiety/depression- - continues to live on his own and is managing his medications that are being bubble packed by his pharmacy - says that he "knows" he's taking gabapentin but that isn't listed on his bubble pack that he brought - follows with psychiatry (Su) on a monthly basis  5: Lymphedema- - stage 2 - has ordered some new compression socks and is waiting for them to arrive; instructed to put them on in the mornings with removal at bedtime - does elevate his legs in a recliner during the day but says that the swelling persists - wearing lymphapress compression boots although not consistently. Says that he wears them ~ twice a week. Encouraged him to resume wearing them twice daily  Bubble pack reviewed.  Return in 3 days for a recheck and for lab work at that time.

## 2017-09-20 ENCOUNTER — Ambulatory Visit: Payer: Medicare Other | Attending: Family | Admitting: Family

## 2017-09-20 ENCOUNTER — Encounter: Payer: Medicare Other | Attending: Family Medicine

## 2017-09-20 ENCOUNTER — Encounter: Payer: Self-pay | Admitting: Family

## 2017-09-20 ENCOUNTER — Other Ambulatory Visit: Payer: Self-pay

## 2017-09-20 VITALS — Ht 67.4 in | Wt 262.2 lb

## 2017-09-20 VITALS — BP 108/63 | HR 70 | Resp 18 | Ht 67.0 in | Wt 263.0 lb

## 2017-09-20 DIAGNOSIS — Z955 Presence of coronary angioplasty implant and graft: Secondary | ICD-10-CM | POA: Diagnosis not present

## 2017-09-20 DIAGNOSIS — I110 Hypertensive heart disease with heart failure: Secondary | ICD-10-CM | POA: Insufficient documentation

## 2017-09-20 DIAGNOSIS — J449 Chronic obstructive pulmonary disease, unspecified: Secondary | ICD-10-CM | POA: Insufficient documentation

## 2017-09-20 DIAGNOSIS — F419 Anxiety disorder, unspecified: Secondary | ICD-10-CM | POA: Diagnosis not present

## 2017-09-20 DIAGNOSIS — Z9889 Other specified postprocedural states: Secondary | ICD-10-CM | POA: Insufficient documentation

## 2017-09-20 DIAGNOSIS — F329 Major depressive disorder, single episode, unspecified: Secondary | ICD-10-CM | POA: Insufficient documentation

## 2017-09-20 DIAGNOSIS — K219 Gastro-esophageal reflux disease without esophagitis: Secondary | ICD-10-CM | POA: Insufficient documentation

## 2017-09-20 DIAGNOSIS — Z7902 Long term (current) use of antithrombotics/antiplatelets: Secondary | ICD-10-CM | POA: Insufficient documentation

## 2017-09-20 DIAGNOSIS — E7800 Pure hypercholesterolemia, unspecified: Secondary | ICD-10-CM | POA: Insufficient documentation

## 2017-09-20 DIAGNOSIS — Z9981 Dependence on supplemental oxygen: Secondary | ICD-10-CM | POA: Diagnosis not present

## 2017-09-20 DIAGNOSIS — I5023 Acute on chronic systolic (congestive) heart failure: Secondary | ICD-10-CM | POA: Diagnosis present

## 2017-09-20 DIAGNOSIS — I4891 Unspecified atrial fibrillation: Secondary | ICD-10-CM | POA: Insufficient documentation

## 2017-09-20 DIAGNOSIS — Z91018 Allergy to other foods: Secondary | ICD-10-CM | POA: Insufficient documentation

## 2017-09-20 DIAGNOSIS — Z79899 Other long term (current) drug therapy: Secondary | ICD-10-CM | POA: Insufficient documentation

## 2017-09-20 DIAGNOSIS — I252 Old myocardial infarction: Secondary | ICD-10-CM | POA: Insufficient documentation

## 2017-09-20 DIAGNOSIS — Z885 Allergy status to narcotic agent status: Secondary | ICD-10-CM | POA: Diagnosis not present

## 2017-09-20 DIAGNOSIS — F32A Depression, unspecified: Secondary | ICD-10-CM

## 2017-09-20 DIAGNOSIS — I959 Hypotension, unspecified: Secondary | ICD-10-CM | POA: Insufficient documentation

## 2017-09-20 DIAGNOSIS — I950 Idiopathic hypotension: Secondary | ICD-10-CM

## 2017-09-20 DIAGNOSIS — G4733 Obstructive sleep apnea (adult) (pediatric): Secondary | ICD-10-CM | POA: Insufficient documentation

## 2017-09-20 DIAGNOSIS — I890 Lymphedema, not elsewhere classified: Secondary | ICD-10-CM

## 2017-09-20 DIAGNOSIS — Z87891 Personal history of nicotine dependence: Secondary | ICD-10-CM | POA: Insufficient documentation

## 2017-09-20 NOTE — Patient Instructions (Signed)
Patient Instructions  Patient Details  Name: Kevin Pearson MRN: [id number] Date of Birth: May 26, 1951 Referring Provider:  Sharilyn Sites, MD  Below are your personal goals for exercise, nutrition, and risk factors. Our goal is to help you stay on track towards obtaining and maintaining these goals. We will be discussing your progress on these goals with you throughout the program.  Initial Exercise Prescription: Initial Exercise Prescription - 09/20/17 1500      Date of Initial Exercise RX and Referring Provider   Date  09/20/17    Referring Provider  Sharilyn Sites MD      Treadmill   MPH  1.3    Grade  0    Minutes  15    METs  2      Recumbant Bike   Level  1    RPM  50    Watts  10    Minutes  15    METs  2      NuStep   Level  1    SPM  80    Minutes  15    METs  2      Prescription Details   Frequency (times per week)  3    Duration  Progress to 45 minutes of aerobic exercise without signs/symptoms of physical distress      Intensity   THRR 40-80% of Max Heartrate  104-138    Ratings of Perceived Exertion  11-13    Perceived Dyspnea  0-4      Progression   Progression  Continue to progress workloads to maintain intensity without signs/symptoms of physical distress.      Resistance Training   Training Prescription  Yes    Weight  3 lbs    Reps  10-15       Exercise Goals: Frequency: Be able to perform aerobic exercise two to three times per week in program working toward 2-5 days per week of home exercise.  Intensity: Work with a perceived exertion of 11 (fairly light) - 15 (hard) while following your exercise prescription.  We will make changes to your prescription with you as you progress through the program.   Duration: Be able to do 30 to 45 minutes of continuous aerobic exercise in addition to a 5 minute warm-up and a 5 minute cool-down routine.   Nutrition Goals: Your personal nutrition goals will be established when you do your nutrition  analysis with the dietician.  The following are general nutrition guidelines to follow: Cholesterol < /day Sodium < /day Fiber: Men over 50 yrs - 30 grams per day  Personal Goals: Personal Goals and Risk Factors at Admission - 09/20/17 1451      Core Components/Risk Factors/Patient Goals on Admission    Weight Management  Yes;Obesity;Weight Loss    Intervention  Weight Management: Develop a combined nutrition and exercise program designed to reach desired caloric intake, while maintaining appropriate intake of nutrient and fiber, sodium and fats, and appropriate energy expenditure required for the weight goal.;Weight Management: Provide education and appropriate resources to help participant work on and attain dietary goals.;Weight Management/Obesity: Establish reasonable short term and long term weight goals.    Admit Weight  262 lb 3.2 oz (118.9 kg)    Goal Weight: Short Term  257 lb (116.6 kg)    Goal Weight: Long Term  180 lb (81.6 kg)    Expected Outcomes  Short Term: Continue to assess and modify interventions until short term weight is achieved;Long Term: Adherence  to nutrition and physical activity/exercise program aimed toward attainment of established weight goal;Weight Maintenance: Understanding of the daily nutrition guidelines, which includes 25-35% calories from fat, 7% or less cal from saturated fats, less than  cholesterol, less than 1.5gm of sodium, & 5 or more servings of fruits and vegetables daily;Weight Loss: Understanding of general recommendations for a balanced deficit meal plan, which promotes 1-2 lb weight loss per week and includes a negative energy balance of [phone] kcal/d;Understanding recommendations for meals to include 15-35% energy as protein, 25-35% energy from fat, 35-60% energy from carbohydrates, less than  of dietary cholesterol, 20-35 gm of total fiber daily;Understanding of distribution of calorie intake throughout the day with the  consumption of 4-5 meals/snacks    Improve shortness of breath with ADL's  Yes    Intervention  Provide education, individualized exercise plan and daily activity instruction to help decrease symptoms of SOB with activities of daily living.    Expected Outcomes  Short Term: Improve cardiorespiratory fitness to achieve a reduction of symptoms when performing ADLs;Long Term: Be able to perform more ADLs without symptoms or delay the onset of symptoms    Heart Failure  Yes    Intervention  Provide a combined exercise and nutrition program that is supplemented with education, support and counseling about heart failure. Directed toward relieving symptoms such as shortness of breath, decreased exercise tolerance, and extremity edema.    Expected Outcomes  Improve functional capacity of life;Short term: Attendance in program 2-3 days a week with increased exercise capacity. Reported lower sodium intake. Reported increased fruit and vegetable intake. Reports medication compliance.;Short term: Daily weights obtained and reported for increase. Utilizing diuretic protocols set by physician.;Long term: Adoption of self-care skills and reduction of barriers for early signs and symptoms recognition and intervention leading to self-care maintenance.    Hypertension  Yes    Intervention  Provide education on lifestyle modifcations including regular physical activity/exercise, weight management, moderate sodium restriction and increased consumption of fresh fruit, vegetables, and low fat dairy, alcohol moderation, and smoking cessation.;Monitor prescription use compliance.    Expected Outcomes  Short Term: Continued assessment and intervention until BP is < 140/58mm HG in hypertensive participants. < 130/36mm HG in hypertensive participants with diabetes, heart failure or chronic kidney disease.;Long Term: Maintenance of blood pressure at goal levels.    Lipids  Yes    Intervention  Provide education and support for  participant on nutrition & aerobic/resistive exercise along with prescribed medications to achieve LDL 70mg , HDL >40mg .    Expected Outcomes  Short Term: Participant states understanding of desired cholesterol values and is compliant with medications prescribed. Participant is following exercise prescription and nutrition guidelines.;Long Term: Cholesterol controlled with medications as prescribed, with individualized exercise RX and with personalized nutrition plan. Value goals: LDL < , HDL > 40 mg.       Tobacco Use Initial Evaluation: Social History   Tobacco Use  Smoking Status Former Smoker  . Packs/day: 1.00  . Years: 40.00  . Pack years: 40.00  . Types: Cigarettes  . Last attempt to quit: 08/2015  . Years since quitting: 2.1  Smokeless Tobacco Never Used    Exercise Goals and Review: Exercise Goals    Row Name 09/20/17 1602             Exercise Goals   Increase Physical Activity  Yes       Intervention  Provide advice, education, support and counseling about physical activity/exercise needs.;Develop an individualized exercise prescription  for aerobic and resistive training based on initial evaluation findings, risk stratification, comorbidities and participant's personal goals.       Expected Outcomes  Short Term: Attend rehab on a regular basis to increase amount of physical activity.;Long Term: Add in home exercise to make exercise part of routine and to increase amount of physical activity.;Long Term: Exercising regularly at least 3-5 days a week.       Increase Strength and Stamina  Yes       Intervention  Provide advice, education, support and counseling about physical activity/exercise needs.;Develop an individualized exercise prescription for aerobic and resistive training based on initial evaluation findings, risk stratification, comorbidities and participant's personal goals.       Expected Outcomes  Short Term: Increase workloads from initial exercise  prescription for resistance, speed, and METs.;Short Term: Perform resistance training exercises routinely during rehab and add in resistance training at home;Long Term: Improve cardiorespiratory fitness, muscular endurance and strength as measured by increased METs and functional capacity ( )       Able to understand and use rate of perceived exertion (RPE) scale  Yes       Intervention  Provide education and explanation on how to use RPE scale       Expected Outcomes  Short Term: Able to use RPE daily in rehab to express subjective intensity level;Long Term:  Able to use RPE to guide intensity level when exercising independently       Able to understand and use Dyspnea scale  Yes       Intervention  Provide education and explanation on how to use Dyspnea scale       Expected Outcomes  Short Term: Able to use Dyspnea scale daily in rehab to express subjective sense of shortness of breath during exertion;Long Term: Able to use Dyspnea scale to guide intensity level when exercising independently       Knowledge and understanding of Target Heart Rate Range (THRR)  Yes       Intervention  Provide education and explanation of THRR including how the numbers were predicted and where they are located for reference       Expected Outcomes  Short Term: Able to state/look up THRR;Long Term: Able to use THRR to govern intensity when exercising independently;Short Term: Able to use daily as guideline for intensity in rehab       Able to check pulse independently  Yes       Intervention  Provide education and demonstration on how to check pulse in carotid and radial arteries.;Review the importance of being able to check your own pulse for safety during independent exercise       Expected Outcomes  Short Term: Able to explain why pulse checking is important during independent exercise;Long Term: Able to check pulse independently and accurately       Understanding of Exercise Prescription  Yes       Intervention   Provide education, explanation, and written materials on patient's individual exercise prescription       Expected Outcomes  Short Term: Able to explain program exercise prescription;Long Term: Able to explain home exercise prescription to exercise independently          Copy of goals given to participant.

## 2017-09-20 NOTE — Progress Notes (Signed)
Pulmonary Individual Treatment Plan  Patient Details  Name: Kevin Pearson MRN: [id number] Date of Birth: Feb 21, 1951 Referring Provider:     Pulmonary Rehab from 09/20/2017 in [facility] Cardiac and Pulmonary Rehab  Referring Provider  Marygrace Drought MD      Initial Encounter Date:    Pulmonary Rehab from 09/20/2017 in [facility] Cardiac and Pulmonary Rehab  Date  09/20/17  Referring Provider  Marygrace Drought MD      Visit Diagnosis: COPD, mild ([place])  Patient's Home Medications on Admission:  Current Outpatient Medications:  .  acetaminophen (TYLENOL) 325 MG tablet, Take 650 mg by mouth every 6 (six) hours as needed., Disp: , Rfl:  .  albuterol (PROVENTIL HFA;VENTOLIN HFA) 108 (90 Base) MCG/ACT inhaler, Inhale 2 puffs into the lungs every 4 (four) hours as needed for shortness of breath., Disp: , Rfl:  .  budesonide (PULMICORT) 0.5 MG/2ML nebulizer solution, Inhale 2 mLs into the lungs daily., Disp: , Rfl: 1 .  clopidogrel (PLAVIX) 75 MG tablet, Take 75 mg by mouth daily., Disp: , Rfl:  .  divalproex (DEPAKOTE) 250 MG DR tablet, Take 500 mg by mouth 2 (two) times daily. , Disp: , Rfl:  .  furosemide (LASIX) 40 MG tablet, Take 60 mg by mouth 2 (two) times daily. , Disp: , Rfl: 0 .  gabapentin (NEURONTIN) 300 MG capsule, Take 600 mg by mouth 3 (three) times daily. Take 300 mg in the morning, 300 mg at 2 pm and 300 mg at night., Disp: , Rfl:  .  ipratropium-albuterol (DUONEB) 0.5-2.5 (3) MG/3ML SOLN, Take 3 mLs by nebulization every 6 (six) hours as needed (wheezing/shortness of breath)., Disp: , Rfl:  .  Melatonin 10 MG TABS, Take 10 mg by mouth at bedtime. , Disp: , Rfl:  .  metolazone (ZAROXOLYN) 2.5 MG tablet, Take 1 tablet (2.5 mg total) by mouth daily. (Patient not taking: Reported on 09/20/2017), Disp: 2 tablet, Rfl: 0 .  metoprolol succinate (TOPROL-XL) 25 MG 24 hr tablet, Take 25 mg by mouth daily., Disp: , Rfl:  .  nitroGLYCERIN (NITROSTAT) 0.4 MG SL tablet, Place 0.4 mg under the tongue  every 5 (five) minutes as needed for chest pain., Disp: , Rfl:  .  omeprazole (PRILOSEC) 40 MG capsule, Take 40 mg by mouth daily., Disp: , Rfl:  .  potassium chloride SA (K-DUR,KLOR-CON) 20 MEQ tablet, Take 1 tablet (20 mEq total) by mouth daily. (Patient not taking: Reported on 09/20/2017), Disp: 2 tablet, Rfl: 0 .  rivaroxaban (XARELTO) 20 MG TABS tablet, Take 20 mg by mouth at bedtime., Disp: , Rfl:  .  rosuvastatin (CRESTOR) 20 MG tablet, Take 20 mg by mouth daily., Disp: , Rfl:  .  sacubitril-valsartan (ENTRESTO) 24-26 MG, Take 1 tablet by mouth 2 (two) times daily. (Patient not taking: Reported on 09/20/2017), Disp: 60 tablet, Rfl: 3 .  senna (SENOKOT) 8.6 MG tablet, Take 2 tablets by mouth 2 (two) times daily., Disp: , Rfl:  .  tamsulosin (FLOMAX) 0.4 MG CAPS capsule, TAKE ONE CAPSULE BY MOUTH EVERY DAY AFTER SUPPER, Disp: 30 capsule, Rfl: 11 .  traZODone (DESYREL) 50 MG tablet, Take 1 tablet (50 mg total) by mouth at bedtime., Disp: 30 tablet, Rfl: 0 .  vortioxetine HBr (TRINTELLIX) 5 MG TABS, Take by mouth., Disp: , Rfl:   Past Medical History: Past Medical History:  Diagnosis Date  . Acid reflux   . Anxiety   . Arthritis   . Atrial fibrillation ([place])   . CHF (congestive  heart failure) ([place])   . Chronic orthostatic hypotension   . Clotting disorder ([place])   . COPD (chronic obstructive pulmonary disease) ([place])   . Depression   . Elevated PSA   . Heart attack ([place])   . Heart disease   . Heart failure ([place])   . Hepatitis   . High cholesterol   . Hypertension   . Sleep apnea   . TBI (traumatic brain injury) ([place])   . Urinary retention     Tobacco Use: Social History   Tobacco Use  Smoking Status Former Smoker  . Packs/day: 1.00  . Years: 40.00  . Pack years: 40.00  . Types: Cigarettes  . Last attempt to quit: 08/2015  . Years since quitting: 2.1  Smokeless Tobacco Never Used    Labs: Recent Review Flowsheet Data    There is no flowsheet data to display.        Pulmonary Assessment Scores: Pulmonary Assessment Scores    Row Name 09/20/17 1444         ADL UCSD   ADL Phase  Entry     SOB Score total  96     Rest  2     Walk  5     Stairs  5     Bath  3     Dress  3     Shop  5       CAT Score   CAT Score  29       mMRC Score   mMRC Score  2        Pulmonary Function Assessment: Pulmonary Function Assessment - 09/20/17 1500      Initial Spirometry Results   FVC%  84 %    FEV1%  83 %    FEV1/FVC Ratio  74.28    Comments  good patient effort best of 2      Post Bronchodilator Spirometry Results   FVC%  91.83 %    FEV1%  100.43 %    FEV1/FVC Ratio  82.17    Comments  good patient      Breath   Bilateral Breath Sounds  Clear    Shortness of Breath  Yes;Limiting activity;Fear of Shortness of Breath;Panic with Shortness of Breath       Exercise Target Goals: Date: 09/20/17  Exercise Program Goal: Individual exercise prescription set using results from initial 6 min walk test and THRR while considering  patient's activity barriers and safety.    Exercise Prescription Goal: Initial exercise prescription builds to 30-45 minutes a day of aerobic activity, 2-3 days per week.  Home exercise guidelines will be given to patient during program as part of exercise prescription that the participant will acknowledge.  Activity Barriers & Risk Stratification: Activity Barriers & Cardiac Risk Stratification - 09/20/17 1558      Activity Barriers & Cardiac Risk Stratification   Activity Barriers  Deconditioning;Muscular Weakness;Shortness of Breath;Back Problems;Joint Problems;Decreased Ventricular Function;Balance Concerns;History of Falls l side residuals from accident       6 Minute Walk: 6 Minute Walk    Row Name 09/20/17 1555         6 Minute Walk   Phase  Initial     Distance  1002 feet     Walk Time  6 minutes     # of Rest Breaks  0     MPH  1.9     METS  1.97     RPE  15  Perceived Dyspnea   2      VO2 Peak  6.88     Symptoms  No     Resting HR  70 bpm     Resting BP  128/56     Resting Oxygen Saturation   99 %     Exercise Oxygen Saturation  during 6 min walk  97 %     Max Ex. HR  104 bpm     Max Ex. BP  136/84     2 Minute Post BP  134/74       Interval HR   1 Minute HR  92     2 Minute HR  91     3 Minute HR  93     4 Minute HR  89     5 Minute HR  97     6 Minute HR  104     2 Minute Post HR  72     Interval Heart Rate?  Yes       Interval Oxygen   Interval Oxygen?  Yes     Baseline Oxygen Saturation %  99 %     1 Minute Oxygen Saturation %  97 %     1 Minute Liters of Oxygen  0 L Room Air     2 Minute Oxygen Saturation %  97 %     2 Minute Liters of Oxygen  0 L     3 Minute Oxygen Saturation %  97 %     3 Minute Liters of Oxygen  0 L     4 Minute Oxygen Saturation %  98 %     4 Minute Liters of Oxygen  0 L     5 Minute Oxygen Saturation %  97 %     5 Minute Liters of Oxygen  0 L     6 Minute Oxygen Saturation %  97 %     6 Minute Liters of Oxygen  0 L     2 Minute Post Oxygen Saturation %  99 %     2 Minute Post Liters of Oxygen  0 L       Oxygen Initial Assessment: Oxygen Initial Assessment - 09/20/17 1450      Home Oxygen   Home Oxygen Device  Home Concentrator states he has three machines. He states he has not been using oxygen the last 6-7 weeks    Sleep Oxygen Prescription  None    Home Exercise Oxygen Prescription  None    Home at Rest Exercise Oxygen Prescription  None    Compliance with Home Oxygen Use  Yes      Initial 6 min Walk   Oxygen Used  None      Program Oxygen Prescription   Program Oxygen Prescription  None      Intervention   Short Term Goals  To learn and understand importance of maintaining oxygen saturations>88%;To learn and demonstrate proper use of respiratory medications;To learn and demonstrate proper pursed lip breathing techniques or other breathing techniques.;To learn and understand importance of monitoring SPO2 with  pulse oximeter and demonstrate accurate use of the pulse oximeter.    Long  Term Goals  Verbalizes importance of monitoring SPO2 with pulse oximeter and return demonstration;Maintenance of O2 saturations>88%;Exhibits proper breathing techniques, such as pursed lip breathing or other method taught during program session;Compliance with respiratory medication;Demonstrates proper use of MDI's       Oxygen Re-Evaluation:   Oxygen Discharge (  Final Oxygen Re-Evaluation):   Initial Exercise Prescription: Initial Exercise Prescription - 09/20/17 1500      Date of Initial Exercise RX and Referring Provider   Date  09/20/17    Referring Provider  Marygrace Drought MD      Treadmill   MPH  1.3    Grade  0    Minutes  15    METs  2      Recumbant Bike   Level  1    RPM  50    Watts  10    Minutes  15    METs  2      NuStep   Level  1    SPM  80    Minutes  15    METs  2      Prescription Details   Frequency (times per week)  3    Duration  Progress to 45 minutes of aerobic exercise without signs/symptoms of physical distress      Intensity   THRR 40-80% of Max Heartrate  104-138    Ratings of Perceived Exertion  11-13    Perceived Dyspnea  0-4      Progression   Progression  Continue to progress workloads to maintain intensity without signs/symptoms of physical distress.      Resistance Training   Training Prescription  Yes    Weight  3 lbs    Reps  10-15       Perform Capillary Blood Glucose checks as needed.  Exercise Prescription Changes: Exercise Prescription Changes    Row Name 09/20/17 1500             Response to Exercise   Blood Pressure (Admit)  128/56       Blood Pressure (Exercise)  136/84       Blood Pressure (Exit)  134/74       Heart Rate (Admit)  70 bpm       Heart Rate (Exercise)  104 bpm       Heart Rate (Exit)  72 bpm       Oxygen Saturation (Admit)  99 %       Oxygen Saturation (Exercise)  97 %       Oxygen Saturation (Exit)  99 %        Rating of Perceived Exertion (Exercise)  15       Perceived Dyspnea (Exercise)  2       Symptoms  none       Comments  walk test results          Exercise Comments:   Exercise Goals and Review: Exercise Goals    Row Name 09/20/17 1602             Exercise Goals   Increase Physical Activity  Yes       Intervention  Provide advice, education, support and counseling about physical activity/exercise needs.;Develop an individualized exercise prescription for aerobic and resistive training based on initial evaluation findings, risk stratification, comorbidities and participant's personal goals.       Expected Outcomes  Short Term: Attend rehab on a regular basis to increase amount of physical activity.;Long Term: Add in home exercise to make exercise part of routine and to increase amount of physical activity.;Long Term: Exercising regularly at least 3-5 days a week.       Increase Strength and Stamina  Yes       Intervention  Provide advice, education, support and counseling about physical activity/exercise needs.;Develop  an individualized exercise prescription for aerobic and resistive training based on initial evaluation findings, risk stratification, comorbidities and participant's personal goals.       Expected Outcomes  Short Term: Increase workloads from initial exercise prescription for resistance, speed, and METs.;Short Term: Perform resistance training exercises routinely during rehab and add in resistance training at home;Long Term: Improve cardiorespiratory fitness, muscular endurance and strength as measured by increased METs and functional capacity (6MWT)       Able to understand and use rate of perceived exertion (RPE) scale  Yes       Intervention  Provide education and explanation on how to use RPE scale       Expected Outcomes  Short Term: Able to use RPE daily in rehab to express subjective intensity level;Long Term:  Able to use RPE to guide intensity level when exercising  independently       Able to understand and use Dyspnea scale  Yes       Intervention  Provide education and explanation on how to use Dyspnea scale       Expected Outcomes  Short Term: Able to use Dyspnea scale daily in rehab to express subjective sense of shortness of breath during exertion;Long Term: Able to use Dyspnea scale to guide intensity level when exercising independently       Knowledge and understanding of Target Heart Rate Range (THRR)  Yes       Intervention  Provide education and explanation of THRR including how the numbers were predicted and where they are located for reference       Expected Outcomes  Short Term: Able to state/look up THRR;Long Term: Able to use THRR to govern intensity when exercising independently;Short Term: Able to use daily as guideline for intensity in rehab       Able to check pulse independently  Yes       Intervention  Provide education and demonstration on how to check pulse in carotid and radial arteries.;Review the importance of being able to check your own pulse for safety during independent exercise       Expected Outcomes  Short Term: Able to explain why pulse checking is important during independent exercise;Long Term: Able to check pulse independently and accurately       Understanding of Exercise Prescription  Yes       Intervention  Provide education, explanation, and written materials on patient's individual exercise prescription       Expected Outcomes  Short Term: Able to explain program exercise prescription;Long Term: Able to explain home exercise prescription to exercise independently          Exercise Goals Re-Evaluation :   Discharge Exercise Prescription (Final Exercise Prescription Changes): Exercise Prescription Changes - 09/20/17 1500      Response to Exercise   Blood Pressure (Admit)  128/56    Blood Pressure (Exercise)  136/84    Blood Pressure (Exit)  134/74    Heart Rate (Admit)  70 bpm    Heart Rate (Exercise)  104 bpm     Heart Rate (Exit)  72 bpm    Oxygen Saturation (Admit)  99 %    Oxygen Saturation (Exercise)  97 %    Oxygen Saturation (Exit)  99 %    Rating of Perceived Exertion (Exercise)  15    Perceived Dyspnea (Exercise)  2    Symptoms  none    Comments  walk test results       Nutrition:  Target Goals:  Understanding of nutrition guidelines, daily intake of sodium <15107m, cholesterol <2017m calories 30% from fat and 7% or less from saturated fats, daily to have 5 or more servings of fruits and vegetables.  Biometrics: Pre Biometrics - 09/20/17 1602      Pre Biometrics   Height  5' 7.4" (1.712 m)    Weight  262 lb 3.2 oz (118.9 kg)    Waist Circumference  45 inches    Hip Circumference  46 inches    Waist to Hip Ratio  0.98 %    BMI (Calculated)  40.58        Nutrition Therapy Plan and Nutrition Goals: Nutrition Therapy & Goals - 09/20/17 1443      Personal Nutrition Goals   Comments  Eat healthier and Lose weight.      Intervention Plan   Intervention  Prescribe, educate and counsel regarding individualized specific dietary modifications aiming towards targeted core components such as weight, hypertension, lipid management, diabetes, heart failure and other comorbidities.;Nutrition handout(s) given to patient.    Expected Outcomes  Short Term Goal: Understand basic principles of dietary content, such as calories, fat, sodium, cholesterol and nutrients.;Long Term Goal: Adherence to prescribed nutrition plan.       Nutrition Assessments: Nutrition Assessments - 09/20/17 1449      MEDFICTS Scores   Pre Score  60       Nutrition Goals Re-Evaluation:   Nutrition Goals Discharge (Final Nutrition Goals Re-Evaluation):   Psychosocial: Target Goals: Acknowledge presence or absence of significant depression and/or stress, maximize coping skills, provide positive support system. Participant is able to verbalize types and ability to use techniques and skills needed for reducing  stress and depression.   Initial Review & Psychosocial Screening: Initial Psych Review & Screening - 09/20/17 1439      Initial Review   Current issues with  History of Depression;Current Anxiety/Panic;Current Sleep Concerns;Current Stress Concerns;Current Depression    Source of Stress Concerns  Chronic Illness;Unable to participate in former interests or hobbies    Comments  He uses medication to help him sleep and sees a psychologist.      [place] Yes    Comments  His daughter and ex-wife are a good source for support.       Quality of Life Scores:  Scores of 19 and below usually indicate a poorer quality of life in these areas.  A difference of  2-3 points is a clinically meaningful difference.  A difference of 2-3 points in the total score of the Quality of Life Index has been associated with significant improvement in overall quality of life, self-image, physical symptoms, and general health in studies assessing change in quality of life.  PHQ-9: Recent Review Flowsheet Data    [facility] 09/20/2017 09/20/2017 06/21/2017 04/14/2017 03/31/2017   Decreased Interest 3 0 0 0 0   Down, Depressed, Hopeless 2 0 1 0 0   PHQ - 2 Score 5 0 1 0 0   Altered sleeping 2 - - - -   Tired, decreased energy 2 - - - -   Change in appetite 3 - - - -   Feeling bad or failure about yourself  2 - - - -   Trouble concentrating 1 - - - -   Moving slowly or fidgety/restless 1 - - - -   Suicidal thoughts 1  - - - -   PHQ-9 Score 17 - - - -  Difficult doing work/chores Very difficult - - - -     Interpretation of Total Score  Total Score Depression Severity:  1-4 = Minimal depression, 5-9 = Mild depression, 10-14 = Moderate depression, 15-19 = Moderately severe depression, 20-27 = Severe depression   Psychosocial Evaluation and Intervention:   Psychosocial Re-Evaluation:   Psychosocial Discharge (Final Psychosocial  Re-Evaluation):   Education: Education Goals: Education classes will be provided on a weekly basis, covering required topics. Participant will state understanding/return demonstration of topics presented.  Learning Barriers/Preferences: Learning Barriers/Preferences - 09/20/17 1447      Learning Barriers/Preferences   Learning Barriers  Hearing    Learning Preferences  Skilled Demonstration       Education Topics:  Initial Evaluation Education: - Verbal, written and demonstration of respiratory meds, oximetry and breathing techniques. Instruction on use of nebulizers and MDIs and importance of monitoring MDI activations.   Pulmonary Rehab from 09/20/2017 in [facility] Cardiac and Pulmonary Rehab  Date  09/20/17  Educator  [facility]  Instruction Review Code  1- Verbalizes Understanding      General Nutrition Guidelines/Fats and Fiber: -Group instruction provided by verbal, written material, models and posters to present the general guidelines for heart healthy nutrition. Gives an explanation and review of dietary fats and fiber.   Cardiac Rehab from 10/27/2016 in [facility] Cardiac and Pulmonary Rehab  Date  09/08/16  Educator  CR  Instruction Review Code (retired)  2- meets goals/outcomes      Controlling Sodium/Reading Food Labels: -Group verbal and written material supporting the discussion of sodium use in heart healthy nutrition. Review and explanation with models, verbal and written materials for utilization of the food label.   Exercise Physiology & General Exercise Guidelines: - Group verbal and written instruction with models to review the exercise physiology of the cardiovascular system and associated critical values. Provides general exercise guidelines with specific guidelines to those with heart or lung disease.    Cardiac Rehab from 10/27/2016 in [facility] Cardiac and Pulmonary Rehab  Date  09/22/16  Educator  [facility]  Instruction Review Code (retired)  2- meets goals/outcomes       Aerobic Exercise & Resistance Training: - Gives group verbal and written instruction on the various components of exercise. Focuses on aerobic and resistive training programs and the benefits of this training and how to safely progress through these programs.   Flexibility, Balance, Mind/Body Relaxation: Provides group verbal/written instruction on the benefits of flexibility and balance training, including mind/body exercise modes such as yoga, pilates and tai chi.  Demonstration and skill practice provided.   Cardiac Rehab from 10/27/2016 in [facility] Cardiac and Pulmonary Rehab  Date  09/29/16  Educator  [facility]  Instruction Review Code (retired)  2- meets goals/outcomes      Stress and Anxiety: - Provides group verbal and written instruction about the health risks of elevated stress and causes of high stress.  Discuss the correlation between heart/lung disease and anxiety and treatment options. Review healthy ways to manage with stress and anxiety.   Cardiac Rehab from 10/27/2016 in [facility] Cardiac and Pulmonary Rehab  Date  08/13/16  Educator  TS  Instruction Review Code (retired)  2- meets goals/outcomes      Depression: - Provides group verbal and written instruction on the correlation between heart/lung disease and depressed mood, treatment options, and the stigmas associated with seeking treatment.   Cardiac Rehab from 10/27/2016 in [facility] Cardiac and Pulmonary Rehab  Date  09/10/16  Educator  TS  Instruction  Review Code (retired)  2- meets goals/outcomes      Exercise & Equipment Safety: - Individual verbal instruction and demonstration of equipment use and safety with use of the equipment.   Pulmonary Rehab from 09/20/2017 in [facility] Cardiac and Pulmonary Rehab  Date  09/20/17  Educator  [facility]  Instruction Review Code  1- Verbalizes Understanding      Infection Prevention: - Provides verbal and written material to individual with discussion of infection control including proper hand  washing and proper equipment cleaning during exercise session.   Pulmonary Rehab from 09/20/2017 in [facility] Cardiac and Pulmonary Rehab  Date  09/20/17  Educator  [facility]  Instruction Review Code  1- Verbalizes Understanding      Falls Prevention: - Provides verbal and written material to individual with discussion of falls prevention and safety.   Pulmonary Rehab from 09/20/2017 in [facility] Cardiac and Pulmonary Rehab  Date  09/20/17  Educator  [facility]  Instruction Review Code  1- Verbalizes Understanding      Diabetes: - Individual verbal and written instruction to review signs/symptoms of diabetes, desired ranges of glucose level fasting, after meals and with exercise. Advice that pre and post exercise glucose checks will be done for 3 sessions at entry of program.   Chronic Lung Diseases: - Group verbal and written instruction to review updates, respiratory medications, advancements in procedures and treatments. Discuss use of supplemental oxygen including available portable oxygen systems, continuous and intermittent flow rates, concentrators, personal use and safety guidelines. Review proper use of inhaler and spacers. Provide informative websites for self-education.    Energy Conservation: - Provide group verbal and written instruction for methods to conserve energy, plan and organize activities. Instruct on pacing techniques, use of adaptive equipment and posture/positioning to relieve shortness of breath.   Triggers and Exacerbations: - Group verbal and written instruction to review types of environmental triggers and ways to prevent exacerbations. Discuss weather changes, air quality and the benefits of nasal washing. Review warning signs and symptoms to help prevent infections. Discuss techniques for effective airway clearance, coughing, and vibrations.   AED/CPR: - Group verbal and written instruction with the use of models to demonstrate the basic use of the AED with the basic ABC's of  resuscitation.   Anatomy and Physiology of the Lungs: - Group verbal and written instruction with the use of models to provide basic lung anatomy and physiology related to function, structure and complications of lung disease.   Anatomy & Physiology of the Heart: - Group verbal and written instruction and models provide basic cardiac anatomy and physiology, with the coronary electrical and arterial systems. Review of Valvular disease and Heart Failure   Cardiac Rehab from 10/27/2016 in [facility] Cardiac and Pulmonary Rehab  Date  10/08/16  Educator  SB  Instruction Review Code (retired)  2- meets goals/outcomes      Cardiac Medications: - Group verbal and written instruction to review commonly prescribed medications for heart disease. Reviews the medication, class of the drug, and side effects.   Cardiac Rehab from 10/27/2016 in [facility] Cardiac and Pulmonary Rehab  Date  10/22/16  Educator  [place]  Instruction Review Code (retired)  2- meets Chiropodist 1]      Know Your Numbers and Risk Factors: -Group verbal and written instruction about important numbers in your health.  Discussion of what are risk factors and how they play a role in the disease process.  Review of Cholesterol, Blood Pressure, Diabetes, and BMI and the role they  play in your overall health.   Sleep Hygiene: -Provides group verbal and written instruction about how sleep can affect your health.  Define sleep hygiene, discuss sleep cycles and impact of sleep habits. Review good sleep hygiene tips.    Other: -Provides group and verbal instruction on various topics (see comments)    Knowledge Questionnaire Score: Knowledge Questionnaire Score - 09/20/17 1447      Knowledge Questionnaire Score   Pre Score  17/18 reviewed with patient        Core Components/Risk Factors/Patient Goals at Admission: Personal Goals and Risk Factors at Admission - 09/20/17 1451      Core Components/Risk Factors/Patient Goals on  Admission    Weight Management  Yes;Obesity;Weight Loss    Intervention  Weight Management: Develop a combined nutrition and exercise program designed to reach desired caloric intake, while maintaining appropriate intake of nutrient and fiber, sodium and fats, and appropriate energy expenditure required for the weight goal.;Weight Management: Provide education and appropriate resources to help participant work on and attain dietary goals.;Weight Management/Obesity: Establish reasonable short term and long term weight goals.    Admit Weight  262 lb 3.2 oz (118.9 kg)    Goal Weight: Short Term  257 lb (116.6 kg)    Goal Weight: Long Term  180 lb (81.6 kg)    Expected Outcomes  Short Term: Continue to assess and modify interventions until short term weight is achieved;Long Term: Adherence to nutrition and physical activity/exercise program aimed toward attainment of established weight goal;Weight Maintenance: Understanding of the daily nutrition guidelines, which includes 25-35% calories from fat, 7% or less cal from saturated fats, less than 262m cholesterol, less than 1.5gm of sodium, & 5 or more servings of fruits and vegetables daily;Weight Loss: Understanding of general recommendations for a balanced deficit meal plan, which promotes 1-2 lb weight loss per week and includes a negative energy balance of [phone] kcal/d;Understanding recommendations for meals to include 15-35% energy as protein, 25-35% energy from fat, 35-60% energy from carbohydrates, less than 2076mof dietary cholesterol, 20-35 gm of total fiber daily;Understanding of distribution of calorie intake throughout the day with the consumption of 4-5 meals/snacks    Improve shortness of breath with ADL's  Yes    Intervention  Provide education, individualized exercise plan and daily activity instruction to help decrease symptoms of SOB with activities of daily living.    Expected Outcomes  Short Term: Improve cardiorespiratory fitness to  achieve a reduction of symptoms when performing ADLs;Long Term: Be able to perform more ADLs without symptoms or delay the onset of symptoms    Heart Failure  Yes    Intervention  Provide a combined exercise and nutrition program that is supplemented with education, support and counseling about heart failure. Directed toward relieving symptoms such as shortness of breath, decreased exercise tolerance, and extremity edema.    Expected Outcomes  Improve functional capacity of life;Short term: Attendance in program 2-3 days a week with increased exercise capacity. Reported lower sodium intake. Reported increased fruit and vegetable intake. Reports medication compliance.;Short term: Daily weights obtained and reported for increase. Utilizing diuretic protocols set by physician.;Long term: Adoption of self-care skills and reduction of barriers for early signs and symptoms recognition and intervention leading to self-care maintenance.    Hypertension  Yes    Intervention  Provide education on lifestyle modifcations including regular physical activity/exercise, weight management, moderate sodium restriction and increased consumption of fresh fruit, vegetables, and low fat dairy, alcohol moderation, and smoking cessation.;Monitor  prescription use compliance.    Expected Outcomes  Short Term: Continued assessment and intervention until BP is < 140/44m HG in hypertensive participants. < 130/84mHG in hypertensive participants with diabetes, heart failure or chronic kidney disease.;Long Term: Maintenance of blood pressure at goal levels.    Lipids  Yes    Intervention  Provide education and support for participant on nutrition & aerobic/resistive exercise along with prescribed medications to achieve LDL <7064mHDL >55m31m  Expected Outcomes  Short Term: Participant states understanding of desired cholesterol values and is compliant with medications prescribed. Participant is following exercise prescription and  nutrition guidelines.;Long Term: Cholesterol controlled with medications as prescribed, with individualized exercise RX and with personalized nutrition plan. Value goals: LDL < 70mg56mL > 40 mg.       Core Components/Risk Factors/Patient Goals Review:    Core Components/Risk Factors/Patient Goals at Discharge (Final Review):    ITP Comments: ITP Comments    Row Name 09/20/17 1411           ITP Comments  Medical Evaluation completed. Chart sent for review and changes to Dr. Mark Emily Filbertctor of Lung[facility]nosis can be found in [facility] encounter 08/27/17          Comments: Initial ITP

## 2017-09-20 NOTE — Patient Instructions (Addendum)
Continue weighing daily and call for an overnight weight gain of > 2 pounds or a weekly weight gain of >5 pounds.  Take the metolazone and potassium daily for the next 2 days. Will recheck labs in 3 days.

## 2017-09-21 DIAGNOSIS — I509 Heart failure, unspecified: Secondary | ICD-10-CM | POA: Insufficient documentation

## 2017-09-22 NOTE — Progress Notes (Signed)
Patient ID: Kevin Pearson, male    DOB: Dec 14, 1950, 67 y.o.   MRN: [id number]  HPI  Kevin Pearson is a 67 y/o male with a history of GERD, anxiety, atrial fibrillation, depression, COPD (chronic O2), TBI, MI (multiple stents), hepatitis, hyperlipidemia, HTN, obstructive sleep apnea (without CPAP), remote tobacco/drug/alcohol use and chronic heart failure.   Reviewed last echo report on 02/14/16 which showed an EF of 35%.   Admitted 08/10/17 due to confusion and UTI. Urology consult obtained and antibiotics were started. He was discharged after 7 days to SNF.          He presents today for a follow-up visit with a chief complaint of moderate fatigue upon minimal exertion. He describes this as chronic in nature having been present for several years. Does feel like it's a little bit better. He has associated chest tightness, shortness of breath, edema (improving), light-headedness, depression and difficulty sleeping. He denies any abdominal distention, palpitations, chest pain or weight gain.   Took 2 days of metolazone 2.5mg  along with potassium since he was last here and feels like his edema has improved some. Would like a new patient appointment with a different cardiologist.   Past Medical History:  Diagnosis Date  . Acid reflux   . Anxiety   . Arthritis   . Atrial fibrillation (HCC)   . CHF (congestive heart failure) (HCC)   . Chronic orthostatic hypotension   . Clotting disorder (HCC)   . COPD (chronic obstructive pulmonary disease) (HCC)   . Depression   . Elevated PSA   . Heart attack (HCC)   . Heart disease   . Heart failure (HCC)   . Hepatitis   . High cholesterol   . Hypertension   . Sleep apnea   . TBI (traumatic brain injury) (HCC)   . Urinary retention    Past Surgical History:  Procedure Laterality Date  . BLADDER SURGERY    . cardiac stents    . CYSTOSCOPY WITH URETHRAL DILATATION N/A 06/25/2016   Procedure: CYSTOSCOPY WITH URETHRAL DILATATION;  Surgeon: Vanna Scotland, MD;   Location: [facility] ORS;  Service: Urology;  Laterality: N/A;  . EP IMPLANTABLE DEVICE     St[employer] Jude BiV-ICD  . GREEN LIGHT LASER TURP (TRANSURETHRAL RESECTION OF PROSTATE  2016   done in [facility]    Family History  Problem Relation Age of Onset  . Other Father        Cerebral hemorrhage  . Kidney cancer Neg Hx   . Kidney disease Neg Hx   . Prostate cancer Neg Hx    Social History   Tobacco Use  . Smoking status: Former Smoker    Packs/day: 1.00    Years: 40.00    Pack years: 40.00    Types: Cigarettes    Last attempt to quit: 08/2015    Years since quitting: 2.1  . Smokeless tobacco: Never Used  Substance Use Topics  . Alcohol use: Yes   Allergies  Allergen Reactions  . Other Anaphylaxis and Itching    Mango skin  . Codeine Nausea And Vomiting   Prior to Admission medications   Medication Sig Start Date End Date Taking? Authorizing Provider  acetaminophen (TYLENOL) 325 MG tablet Take 650 mg by mouth every 6 (six) hours as needed.   Yes [provider]  albuterol (PROVENTIL HFA;VENTOLIN HFA) 108 (90 Base) MCG/ACT inhaler Inhale 2 puffs into the lungs every 4 (four) hours as needed for shortness of breath.   Yes [provider]  budesonide (PULMICORT) 0.5 MG/2ML nebulizer solution Inhale 2 mLs into the lungs daily. 09/26/16  Yes [provider]  clopidogrel (PLAVIX) 75 MG tablet Take 75 mg by mouth daily.   Yes [provider]  divalproex (DEPAKOTE) 250 MG DR tablet Take 500 mg by mouth 2 (two) times daily.  07/30/17  Yes [provider]  furosemide (LASIX) 40 MG tablet Take 60 mg by mouth 2 (two) times daily.  04/19/17  Yes [provider]  gabapentin (NEURONTIN) 300 MG capsule Take 600 mg by mouth 3 (three) times daily. Take 300 mg in the morning, 300 mg at 2 pm and 300 mg at night.   Yes [provider]  ipratropium-albuterol (DUONEB) 0.5-2.5 (3) MG/3ML SOLN Take 3 mLs by nebulization every 6 (six) hours as needed  (wheezing/shortness of breath).   Yes [provider]  Melatonin 10 MG TABS Take 10 mg by mouth at bedtime.    Yes [provider]  metolazone (ZAROXOLYN) 2.5 MG tablet Take 1 tablet (2.5 mg total) by mouth daily. 09/09/17 12/08/17 Yes [name] A, FNP  metoprolol succinate (TOPROL-XL) 25 MG 24 hr tablet Take 25 mg by mouth daily.   Yes [provider]  nitroGLYCERIN (NITROSTAT) 0.4 MG SL tablet Place 0.4 mg under the tongue every 5 (five) minutes as needed for chest pain.   Yes [provider]  omeprazole (PRILOSEC) 40 MG capsule Take 40 mg by mouth daily.   Yes [provider]  potassium chloride SA (K-DUR,KLOR-CON) 20 MEQ tablet Take 1 tablet (20 mEq total) by mouth daily. 09/09/17  Yes Hackney, Inetta Fermo A, FNP  rivaroxaban (XARELTO) 20 MG TABS tablet Take 20 mg by mouth at bedtime.   Yes [provider]  rosuvastatin (CRESTOR) 20 MG tablet Take 20 mg by mouth daily.   Yes [provider]  sacubitril-valsartan (ENTRESTO) 24-26 MG Take 1 tablet by mouth 2 (two) times daily. 01/21/17  Yes Hackney, Tina A, FNP  senna (SENOKOT) 8.6 MG tablet Take 2 tablets by mouth 2 (two) times daily.   Yes [provider]  tamsulosin (FLOMAX) 0.4 MG CAPS capsule TAKE ONE CAPSULE BY MOUTH EVERY DAY AFTER SUPPER 03/23/17  Yes McGowan, Carollee Herter A, PA-C  traZODone (DESYREL) 50 MG tablet Take 1 tablet (50 mg total) by mouth at bedtime. 03/06/17  Yes Delfino Lovett, MD  vortioxetine HBr (TRINTELLIX) 5 MG TABS Take by mouth.   Yes [provider]   Review of Systems  Constitutional: Positive for fatigue. Negative for appetite change.  HENT: Negative for congestion, postnasal drip and sore throat.   Eyes: Negative.   Respiratory: Positive for chest tightness and shortness of breath. Negative for cough.   Cardiovascular: Positive for leg swelling (improving). Negative for chest pain and palpitations.  Gastrointestinal: Negative for abdominal distention  and abdominal pain.  Endocrine: Negative.   Genitourinary: Negative.   Musculoskeletal: Positive for arthralgias (left shoulder pain). Negative for back pain.  Skin: Negative.   Allergic/Immunologic: Negative.   Neurological: Positive for light-headedness (when changing positions too quickly) and numbness (bilateral feet). Negative for dizziness and tremors.       Memory issues   Hematological: Negative for adenopathy. Does not bruise/bleed easily.  Psychiatric/Behavioral: Positive for dysphoric mood and sleep disturbance (sleeping on 2 pillows; wearing oxygen at 2L at bedtime and PRN during the day). Negative for suicidal ideas. The patient is nervous/anxious.    Vitals:   09/23/17 1056  BP: 111/76  Pulse: 69  Resp: 18  SpO2: 100%  Weight: 259 lb 2 oz (117.5 kg)  Height: 5\' 7"  (1.702 m)   Wt Readings from Last 3 Encounters:  09/23/17 259 lb 2 oz (117.5 kg)  09/20/17 262 lb 3.2 oz (118.9 kg)  09/20/17 263 lb (119.3 kg)   Lab Results  Component Value Date   CREATININE 1.65 (H) 03/31/2017   CREATININE 1.21 03/22/2017   CREATININE 1.36 (H) 03/06/2017   Physical Exam  Constitutional: He is oriented to person, place, and time. He appears well-developed and well-nourished.  HENT:  Head: Normocephalic and atraumatic.  Neck: Normal range of motion. Neck supple. No JVD present.  Cardiovascular: Normal rate. An irregular rhythm present.  Pulmonary/Chest: Effort normal. He has no wheezes. He has no rales.  Abdominal: Soft. He exhibits no distension. There is no tenderness.  Musculoskeletal: He exhibits edema (2+ pitting edema in bilateral lower legs although softer). He exhibits no tenderness.  Neurological: He is alert and oriented to person, place, and time.  Skin: Skin is warm and dry.  Psychiatric: He has a normal mood and affect. His behavior is normal.  Nursing note and vitals reviewed.  Assessment & Plan:  1: Chronic heart failure with reduced ejection fraction- - NYHA  class II - mildly fluid overloaded  - taking furosemide 60mg  twice daily per his bubble pack - weighing daily.Reminded to weigh every morning, write the weight down and call for an overnight weight gain of >2 pounds or a weekly weight gain of >5 pounds.  - weight down 4 pounds since 09/20/17 - took the metolazone 2.5mg  daily for 2 days along with potassium for 2 days  - not adding salt to his food although he says that he tends to eat salty foods because of convenience. Reinforced the importance of closely following a 2000mg  sodium diet.  - saw cardiologist (Paraschos) 06/30/17 and would like to see a different cardiologist. Appointment was scheduled with [place] on 10/04/17 - has had [facility] nursing recently  - starts at pulmonary rehab 09/27/17 - will get a BMP today since he took metolazone - PharmD reconciled medications with the patient - may need echo repeated as last one was done August 2017   2: Hypotension- - BP continues on the low side so will not re-initiate entresto at this time - saw PCP ([place]) 06/21/17 - BMP from 03/31/17 reviewed and shows sodium 137, potassium 3.6 and GFR 42  3: Obstructive sleep apnea- - wears oxygen at 2L at bedtime and during the day as needed  - saw pulmonologist Meredeth Ide) 07/28/17  4: Anxiety/depression- - continues to live on his own and is managing his medications that are being bubble packed by his pharmacy - says that he "knows" he's taking gabapentin but that isn't listed on his bubble pack that he brought - follows with psychiatry (Su) on a monthly basis  5: Lymphedema- - stage 2 - has ordered some new compression socks and is waiting for them to arrive; instructed to put them on in the mornings with removal at bedtime - does elevate his legs in a recliner during the day but says that the swelling persists - not wearing the compression boots as he just doesn't feel like he can find the time to wear them. Discussed the  importance of wearing them at least once a day for an hour with the goal to be wearing them twice daily for an hour each time. Patient commits to wearing them at least once daily  Bubble pack reviewed.  Return here in 3 months or sooner for any questions/problems before then.

## 2017-09-23 ENCOUNTER — Ambulatory Visit: Payer: Medicare Other

## 2017-09-23 ENCOUNTER — Ambulatory Visit: Payer: Medicare Other | Admitting: Family

## 2017-09-23 ENCOUNTER — Encounter: Payer: Self-pay | Admitting: Family

## 2017-09-23 ENCOUNTER — Ambulatory Visit: Payer: Medicare Other | Attending: Family | Admitting: Family

## 2017-09-23 VITALS — BP 111/76 | HR 69 | Resp 18 | Ht 67.0 in | Wt 259.1 lb

## 2017-09-23 DIAGNOSIS — I5022 Chronic systolic (congestive) heart failure: Secondary | ICD-10-CM

## 2017-09-23 DIAGNOSIS — Z9981 Dependence on supplemental oxygen: Secondary | ICD-10-CM | POA: Insufficient documentation

## 2017-09-23 DIAGNOSIS — Z7901 Long term (current) use of anticoagulants: Secondary | ICD-10-CM | POA: Insufficient documentation

## 2017-09-23 DIAGNOSIS — Z87440 Personal history of urinary (tract) infections: Secondary | ICD-10-CM | POA: Diagnosis not present

## 2017-09-23 DIAGNOSIS — I4891 Unspecified atrial fibrillation: Secondary | ICD-10-CM | POA: Insufficient documentation

## 2017-09-23 DIAGNOSIS — Z79899 Other long term (current) drug therapy: Secondary | ICD-10-CM | POA: Insufficient documentation

## 2017-09-23 DIAGNOSIS — N320 Bladder-neck obstruction: Secondary | ICD-10-CM

## 2017-09-23 DIAGNOSIS — I110 Hypertensive heart disease with heart failure: Secondary | ICD-10-CM | POA: Insufficient documentation

## 2017-09-23 DIAGNOSIS — I252 Old myocardial infarction: Secondary | ICD-10-CM | POA: Insufficient documentation

## 2017-09-23 DIAGNOSIS — J449 Chronic obstructive pulmonary disease, unspecified: Secondary | ICD-10-CM | POA: Diagnosis not present

## 2017-09-23 DIAGNOSIS — I890 Lymphedema, not elsewhere classified: Secondary | ICD-10-CM | POA: Diagnosis not present

## 2017-09-23 DIAGNOSIS — E7800 Pure hypercholesterolemia, unspecified: Secondary | ICD-10-CM | POA: Diagnosis not present

## 2017-09-23 DIAGNOSIS — E785 Hyperlipidemia, unspecified: Secondary | ICD-10-CM | POA: Insufficient documentation

## 2017-09-23 DIAGNOSIS — F419 Anxiety disorder, unspecified: Secondary | ICD-10-CM | POA: Insufficient documentation

## 2017-09-23 DIAGNOSIS — F329 Major depressive disorder, single episode, unspecified: Secondary | ICD-10-CM | POA: Insufficient documentation

## 2017-09-23 DIAGNOSIS — G4733 Obstructive sleep apnea (adult) (pediatric): Secondary | ICD-10-CM | POA: Diagnosis not present

## 2017-09-23 DIAGNOSIS — I950 Idiopathic hypotension: Secondary | ICD-10-CM

## 2017-09-23 DIAGNOSIS — F32A Depression, unspecified: Secondary | ICD-10-CM

## 2017-09-23 DIAGNOSIS — K219 Gastro-esophageal reflux disease without esophagitis: Secondary | ICD-10-CM | POA: Diagnosis not present

## 2017-09-23 DIAGNOSIS — I959 Hypotension, unspecified: Secondary | ICD-10-CM | POA: Diagnosis not present

## 2017-09-23 DIAGNOSIS — Z87891 Personal history of nicotine dependence: Secondary | ICD-10-CM | POA: Diagnosis not present

## 2017-09-23 LAB — BASIC METABOLIC PANEL
Anion gap: 13 (ref 5–15)
BUN: 37 mg/dL — ABNORMAL HIGH (ref 6–20)
CO2: 27 mmol/L (ref 22–32)
Calcium: 9.8 mg/dL (ref 8.9–10.3)
Chloride: 100 mmol/L — ABNORMAL LOW (ref 101–111)
Creatinine, Ser: 1.73 mg/dL — ABNORMAL HIGH (ref 0.61–1.24)
GFR calc Af Amer: 46 mL/min — ABNORMAL LOW (ref >60)
GFR calc non Af Amer: 40 mL/min — ABNORMAL LOW (ref >60)
Glucose, Bld: 113 mg/dL — ABNORMAL HIGH (ref 65–99)
Potassium: 3.8 mmol/L (ref 3.5–5.1)
Sodium: 140 mmol/L (ref 135–145)

## 2017-09-23 NOTE — Progress Notes (Signed)
Pt presented today to learn how to use a in and out cath that insurance will cover. Pt was not able to pass a flex pro or male speedi cath due to catheters being to flimsy. Samples of speedi cath compact male were given to pt. Pt voiced understanding.

## 2017-09-23 NOTE — Patient Instructions (Signed)
Continue weighing daily and call for an overnight weight gain of > 2 pounds or a weekly weight gain of >5 pounds. 

## 2017-09-27 DIAGNOSIS — J449 Chronic obstructive pulmonary disease, unspecified: Secondary | ICD-10-CM | POA: Diagnosis not present

## 2017-09-27 NOTE — Progress Notes (Signed)
Daily Session Note  Patient Details  Name: Kevin Pearson MRN: [id number] Date of Birth: 1950/08/18 Referring Provider:     Pulmonary Rehab from 09/20/2017 in [facility] Cardiac and Pulmonary Rehab  Referring Provider  Marygrace Drought MD      Encounter Date: 09/27/2017  Check In: Session Check In - 09/27/17 1017      Check-In   Location  [facility]-Cardiac & Pulmonary Rehab    Staff Present  Earlean Shawl, BS, ACSM CEP, Exercise Physiologist;Amanda Oletta Darter, BA, ACSM CEP, Exercise Physiologist;Reynaldo Rossman Flavia Shipper    Supervising physician immediately available to respond to emergencies  [name] immediately available ER MD    Physician(s)  Dr. Alfred Levins and Siadecki    Medication changes reported      No    Fall or balance concerns reported     No    Tobacco Cessation  No Change    Warm-up and Cool-down  Performed as group-led instruction    Resistance Training Performed  Yes    VAD Patient?  No      Pain Assessment   Currently in Pain?  No/denies          Social History   Tobacco Use  Smoking Status Former Smoker  . Packs/day: 1.00  . Years: 40.00  . Pack years: 40.00  . Types: Cigarettes  . Last attempt to quit: 08/2015  . Years since quitting: 2.1  Smokeless Tobacco Never Used    Goals Met:  Exercise tolerated well Personal goals reviewed Queuing for purse lip breathing No report of cardiac concerns or symptoms Strength training completed today  Goals Unmet:  Not Applicable  Comments: First full day of exercise!  Patient was oriented to gym and equipment including functions, settings, policies, and procedures.  Patient's individual exercise prescription and treatment plan were reviewed.  All starting workloads were established based on the results of the 6 minute walk test done at initial orientation visit.  The plan for exercise progression was also introduced and progression will be customized based on patient's performance and goals.   Dr. Emily Filbert is Medical  Director for [place] and [name].

## 2017-09-29 DIAGNOSIS — J449 Chronic obstructive pulmonary disease, unspecified: Secondary | ICD-10-CM

## 2017-09-29 NOTE — Progress Notes (Signed)
Daily Session Note  Patient Details  Name: Montgomery Favor MRN: [id number] Date of Birth: 1951/01/06 Referring Provider:     Pulmonary Rehab from 09/20/2017 in [facility] Cardiac and Pulmonary Rehab  Referring Provider  Marygrace Drought MD      Encounter Date: 09/29/2017  Check In: Session Check In - 09/29/17 1016      Check-In   Location  [facility]-Cardiac & Pulmonary Rehab    Staff Present  Nada Maclachlan, BA, ACSM CEP, Exercise Physiologist;Joseph Darrin Nipper, [place], RCEP, CCRP, Exercise Physiologist    Supervising physician immediately available to respond to emergencies  [name] immediately available ER MD    Physician(s)  Dr. Mariea Clonts and Jimmye Norman    Medication changes reported      No    Fall or balance concerns reported     No    Tobacco Cessation  No Change    Warm-up and Cool-down  Performed as group-led instruction    Resistance Training Performed  Yes    VAD Patient?  No      Pain Assessment   Currently in Pain?  No/denies        Exercise Prescription Changes - 09/29/17 1100      Response to Exercise   Blood Pressure (Admit)  112/66    Blood Pressure (Exercise)  174/84    Blood Pressure (Exit)  102/68    Heart Rate (Admit)  70 bpm    Heart Rate (Exercise)  78 bpm    Heart Rate (Exit)  70 bpm    Oxygen Saturation (Admit)  99 %    Oxygen Saturation (Exercise)  98 %    Oxygen Saturation (Exit)  99 %    Rating of Perceived Exertion (Exercise)  12    Perceived Dyspnea (Exercise)  3    Duration  Progress to 45 minutes of aerobic exercise without signs/symptoms of physical distress    Intensity  THRR unchanged      Progression   Progression  Continue to progress workloads to maintain intensity without signs/symptoms of physical distress.    Average METs  1.7      Resistance Training   Training Prescription  Yes    Weight  3 lb    Reps  10-15      Interval Training   Interval Training  No      Recumbant Bike   Level  1    RPM  50    Watts  12     Minutes  15    METs  1.7      NuStep   Level  1    SPM  80    Minutes  15    METs  1.7      Home Exercise Plan   Plans to continue exercise at  [employer] (comment) water aerobics at [place] 2 additional days to program exercise sessions.    Initial Home Exercises Provided  09/29/17       Social History   Tobacco Use  Smoking Status Former Smoker  . Packs/day: 1.00  . Years: 40.00  . Pack years: 40.00  . Types: Cigarettes  . Last attempt to quit: 08/2015  . Years since quitting: 2.1  Smokeless Tobacco Never Used    Goals Met:  Proper associated with RPD/PD & O2 Sat Independence with exercise equipment Exercise tolerated well Strength training completed today  Goals Unmet:  Not Applicable  Comments: Pt able to follow exercise  prescription today without complaint.  Will continue to monitor for progression.    Dr. Emily Filbert is Medical Director for [place] and [name].

## 2017-09-29 NOTE — Progress Notes (Signed)
Daily Session Note  Patient Details  Name: Kevin Pearson MRN: [id number] Date of Birth: [birth year] Referring Provider:     Pulmonary Rehab from 09/20/2017 in [facility] Cardiac and Pulmonary Rehab  Referring Provider  Marygrace Drought MD      Encounter Date: 09/29/2017  Check In: Session Check In - 09/29/17 1016      Check-In   Location  [facility]-Cardiac & Pulmonary Rehab    Staff Present  Nada Maclachlan, BA, ACSM CEP, Exercise Physiologist;Jaydrian Corpening Darrin Nipper, [place], RCEP, CCRP, Exercise Physiologist    Supervising physician immediately available to respond to emergencies  [name] immediately available ER MD    Physician(s)  Dr. Mariea Clonts and Jimmye Norman    Medication changes reported      No    Fall or balance concerns reported     No    Tobacco Cessation  No Change    Warm-up and Cool-down  Performed as group-led instruction    Resistance Training Performed  Yes    VAD Patient?  No      Pain Assessment   Currently in Pain?  No/denies          Social History   Tobacco Use  Smoking Status Former Smoker  . Packs/day: 1.00  . Years: 40.00  . Pack years: 40.00  . Types: Cigarettes  . Last attempt to quit: 08/2015  . Years since quitting: 2.1  Smokeless Tobacco Never Used    Goals Met:  Independence with exercise equipment Exercise tolerated well No report of cardiac concerns or symptoms Strength training completed today  Goals Unmet:  Not Applicable  Comments: Pt able to follow exercise prescription today without complaint.  Will continue to monitor for progression.   Dr. Emily Filbert is Medical Director for [place] and [name].

## 2017-10-01 ENCOUNTER — Encounter: Payer: Medicare Other | Admitting: *Deleted

## 2017-10-01 DIAGNOSIS — J449 Chronic obstructive pulmonary disease, unspecified: Secondary | ICD-10-CM

## 2017-10-01 NOTE — Progress Notes (Signed)
Daily Session Note  Patient Details  Name: Patsy Varma MRN: [id number] Date of Birth: March 07, 1951 Referring Provider:     Pulmonary Rehab from 09/20/2017 in [facility] Cardiac and Pulmonary Rehab  Referring Provider  Marygrace Drought MD      Encounter Date: 10/01/2017  Check In: Session Check In - 10/01/17 1020      Check-In   Location  [facility]-Cardiac & Pulmonary Rehab    Staff Present  Nyoka Cowden, RN, BSN, MA;Phuoc Huy Sherryll Burger, RN Vickki Hearing, BA, ACSM CEP, Exercise Physiologist    Supervising physician immediately available to respond to emergencies  [name] immediately available ER MD    Physician(s)  Dr. Jacqualine Code and Reita Cliche    Medication changes reported      No    Fall or balance concerns reported     No    Warm-up and Cool-down  Performed as group-led instruction    Resistance Training Performed  Yes    VAD Patient?  No      Pain Assessment   Currently in Pain?  No/denies          Social History   Tobacco Use  Smoking Status Former Smoker  . Packs/day: 1.00  . Years: 40.00  . Pack years: 40.00  . Types: Cigarettes  . Last attempt to quit: 08/2015  . Years since quitting: 2.1  Smokeless Tobacco Never Used    Goals Met:  Proper associated with RPD/PD & O2 Sat Independence with exercise equipment Using PLB without cueing & demonstrates good technique Exercise tolerated well Strength training completed today  Goals Unmet:  Not Applicable  Comments: Pt able to follow exercise prescription today without complaint.  Will continue to monitor for progression.    Dr. Emily Filbert is Medical Director for [place] and [name].
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
## 2017-10-04 ENCOUNTER — Ambulatory Visit: Payer: Medicare Other | Admitting: Cardiovascular Disease

## 2017-10-04 ENCOUNTER — Encounter: Payer: Self-pay | Admitting: Cardiovascular Disease

## 2017-10-04 ENCOUNTER — Encounter: Payer: Medicare Other | Attending: Family Medicine

## 2017-10-04 VITALS — BP 120/62 | HR 70 | Ht 67.5 in | Wt 271.0 lb

## 2017-10-04 DIAGNOSIS — J432 Centrilobular emphysema: Secondary | ICD-10-CM | POA: Diagnosis not present

## 2017-10-04 DIAGNOSIS — R0602 Shortness of breath: Secondary | ICD-10-CM | POA: Diagnosis not present

## 2017-10-04 DIAGNOSIS — N179 Acute kidney failure, unspecified: Secondary | ICD-10-CM | POA: Diagnosis not present

## 2017-10-04 DIAGNOSIS — E785 Hyperlipidemia, unspecified: Secondary | ICD-10-CM | POA: Insufficient documentation

## 2017-10-04 DIAGNOSIS — N183 Chronic kidney disease, stage 3 (moderate): Secondary | ICD-10-CM

## 2017-10-04 DIAGNOSIS — R600 Localized edema: Secondary | ICD-10-CM

## 2017-10-04 DIAGNOSIS — I25118 Atherosclerotic heart disease of native coronary artery with other forms of angina pectoris: Secondary | ICD-10-CM | POA: Insufficient documentation

## 2017-10-04 DIAGNOSIS — I255 Ischemic cardiomyopathy: Secondary | ICD-10-CM | POA: Diagnosis not present

## 2017-10-04 DIAGNOSIS — I5022 Chronic systolic (congestive) heart failure: Secondary | ICD-10-CM

## 2017-10-04 DIAGNOSIS — E782 Mixed hyperlipidemia: Secondary | ICD-10-CM | POA: Diagnosis not present

## 2017-10-04 DIAGNOSIS — G4733 Obstructive sleep apnea (adult) (pediatric): Secondary | ICD-10-CM

## 2017-10-04 DIAGNOSIS — N189 Chronic kidney disease, unspecified: Secondary | ICD-10-CM

## 2017-10-04 DIAGNOSIS — J449 Chronic obstructive pulmonary disease, unspecified: Secondary | ICD-10-CM | POA: Insufficient documentation

## 2017-10-04 NOTE — Patient Instructions (Addendum)
We will schedule an appt with Dr. Graciela HusbandsKlein, ICD check, new patient  Don't forget to use the lymphedema pumps  Medication Instructions:   No medication changes made  Use metolazone cautiously  Labwork:  No new labs needed  Testing/Procedures:  We will schedule an echocardiogram for ischemic cardiomyopathy, shortness of breath Your physician has requested that you have an echocardiogram. Echocardiography is a painless test that uses sound waves to create images of your heart. It provides your doctor with information about the size and shape of your heart and how well your heart's chambers and valves are working. This procedure takes approximately one hour. There are no restrictions for this procedure.    We will schedule a lexiscan myoview for  ischemic cardiomyopathy, shortness of breath, coronary disease [facility] MYOVIEW  Your caregiver has ordered a Stress Test with nuclear imaging. The purpose of this test is to evaluate the blood supply to your heart muscle. This procedure is referred to as a "Non-Invasive Stress Test." This is because other than having an IV started in your vein, nothing is inserted or "invades" your body. Cardiac stress tests are done to find areas of poor blood flow to the heart by determining the extent of coronary artery disease (CAD). Some patients exercise on a treadmill, which naturally increases the blood flow to your heart, while others who are  unable to walk on a treadmill due to physical limitations have a pharmacologic/chemical stress agent called Lexiscan . This medicine will mimic walking on a treadmill by temporarily increasing your coronary blood flow.   Please note: these test may take anywhere between 2-4 hours to complete  PLE[facility]  THE VOLUNTEERS AT THE FIRST DESK WILL DIRECT YOU WHERE TO GO  Date of Procedure:_____________________________________  Arrival Time for  Procedure:______________________________  Instructions regarding medication:   _X___ : Hold fluid pill Furosemide the morning of procedure  __X__:  Hold Metoprolol the night before procedure and morning of procedure    PLEASE NOTIFY THE OFFICE AT LEAST 24 HOURS IN ADVANCE IF YOU ARE UNABLE TO KEEP YOUR APPOINTMENT.  [phone] AND  PLEASE NOTIFY [facility] AT LEAST 24 HOURS IN ADVANCE IF YOU ARE UNABLE TO KEEP YOUR APPOINTMENT. [phone]  How to prepare for your Myoview test:  1. Do not eat or drink after midnight 2. No caffeine for 24 hours prior to test 3. No smoking 24 hours prior to test. 4. Your medication may be taken with water.  If your doctor stopped a medication because of this test, do not take that medication. 5. Ladies, please do not wear dresses.  Skirts or pants are appropriate. Please wear a short sleeve shirt. 6. No perfume, cologne or lotion. 7. Wear comfortable walking shoes. No heels!   Follow-Up: It was a pleasure seeing you in the office today. Please call us if you have new issues that need to be addressed before your next appt.  [phone]  Your physician wants you to follow-up in: 3 months.  You will receive a reminder letter in the mail two months in advance. If you don't receive a letter, please call our office to schedule the follow-up appointment.  If you need a refill on your cardiac medications before your next appointment, please call your pharmacy.  For educational health videos Log in to : [email or web address] Or : [email or web address], password : [email or web address]

## 2017-10-06 DIAGNOSIS — J449 Chronic obstructive pulmonary disease, unspecified: Secondary | ICD-10-CM | POA: Diagnosis not present

## 2017-10-06 NOTE — Progress Notes (Signed)
Daily Session Note  Patient Details  Name: Kevin Pearson MRN: [id number] Date of Birth: [birth year] Referring Provider:     Pulmonary Rehab from 09/20/2017 in [facility] Cardiac and Pulmonary Rehab  Referring Provider  Marygrace Drought MD      Encounter Date: 10/06/2017  Check In: Session Check In - 10/06/17 1018      Check-In   Location  [facility]-Cardiac & Pulmonary Rehab    Staff Present  Justin Mend RCP,RRT,BSRT;Amanda Oletta Darter, BA, ACSM CEP, Exercise Physiologist;Jessica Luan [place]ing, [place], RCEP, CCRP, Exercise Physiologist    Supervising physician immediately available to respond to emergencies  [name] immediately available ER MD    Physician(s)  Dr. Mable Paris and Jimmye Norman    Medication changes reported      No    Fall or balance concerns reported     No    Tobacco Cessation  No Change    Warm-up and Cool-down  Performed as group-led instruction    Resistance Training Performed  Yes    VAD Patient?  No      Pain Assessment   Currently in Pain?  No/denies          Social History   Tobacco Use  Smoking Status Former Smoker  . Packs/day: 1.00  . Years: 40.00  . Pack years: 40.00  . Types: Cigarettes  . Last attempt to quit: 08/2015  . Years since quitting: 2.1  Smokeless Tobacco Never Used    Goals Met:  Independence with exercise equipment Exercise tolerated well No report of cardiac concerns or symptoms Strength training completed today  Goals Unmet:  Not Applicable  Comments: Pt able to follow exercise prescription today without complaint.  Will continue to monitor for progression.   Dr. Emily Filbert is Medical Director for [place] and [name].

## 2017-10-08 ENCOUNTER — Encounter: Payer: Medicare Other | Admitting: *Deleted

## 2017-10-08 DIAGNOSIS — I5022 Chronic systolic (congestive) heart failure: Secondary | ICD-10-CM

## 2017-10-08 DIAGNOSIS — J449 Chronic obstructive pulmonary disease, unspecified: Secondary | ICD-10-CM | POA: Diagnosis not present

## 2017-10-08 NOTE — Progress Notes (Signed)
Daily Session Note  Patient Details  Name: Kevin Pearson MRN: [id number] Date of Birth: [birth year] Referring Provider:     Pulmonary Rehab from 09/20/2017 in [facility] Cardiac and Pulmonary Rehab  Referring Provider  Marygrace Drought MD      Encounter Date: 10/08/2017  Check In: Session Check In - 10/08/17 1105      Check-In   Location  [facility]-Cardiac & Pulmonary Rehab    Staff Present  Nyoka Cowden, RN, BSN, Glori Bickers, BS, Olga Coaster, RN BSN    Supervising physician immediately available to respond to emergencies  [name] immediately available ER MD    Physician(s)  Drs. Mariea Clonts and Rifenbark    Medication changes reported      No    Fall or balance concerns reported     No    Tobacco Cessation  No Change    Warm-up and Cool-down  Performed as group-led instruction    Resistance Training Performed  Yes    VAD Patient?  No      Pain Assessment   Currently in Pain?  No/denies          Social History   Tobacco Use  Smoking Status Former Smoker  . Packs/day: 1.00  . Years: 40.00  . Pack years: 40.00  . Types: Cigarettes  . Last attempt to quit: 08/2015  . Years since quitting: 2.1  Smokeless Tobacco Never Used    Goals Met:  Proper associated with RPD/PD & O2 Sat Independence with exercise equipment Using PLB without cueing & demonstrates good technique Exercise tolerated well Strength training completed today  Goals Unmet:  Not Applicable  Comments: Pt able to follow exercise prescription today without complaint.  Will continue to monitor for progression.    Dr. Emily Filbert is Medical Director for [place] and [name].

## 2017-10-11 DIAGNOSIS — J449 Chronic obstructive pulmonary disease, unspecified: Secondary | ICD-10-CM | POA: Diagnosis not present

## 2017-10-11 NOTE — Progress Notes (Signed)
Daily Session Note  Patient Details  Name: Kevin Pearson MRN: [id number] Date of Birth: 08-May-1951 Referring Provider:     Pulmonary Rehab from 09/20/2017 in [facility] Cardiac and Pulmonary Rehab  Referring Provider  Marygrace Drought MD      Encounter Date: 10/11/2017  Check In: Session Check In - 10/11/17 1010      Check-In   Location  [facility]-Cardiac & Pulmonary Rehab    Staff Present  Earlean Shawl, BS, ACSM CEP, Exercise Physiologist;Amanda Oletta Darter, BA, ACSM CEP, Exercise Physiologist;Joseph Flavia Shipper    Supervising physician immediately available to respond to emergencies  [name] immediately available ER MD    Physician(s)  Dr. Kerman Passey and Corky Downs    Medication changes reported      No    Fall or balance concerns reported     No    Tobacco Cessation  No Change    Warm-up and Cool-down  Performed as group-led instruction    Resistance Training Performed  Yes    VAD Patient?  No      Pain Assessment   Currently in Pain?  No/denies          Social History   Tobacco Use  Smoking Status Former Smoker  . Packs/day: 1.00  . Years: 40.00  . Pack years: 40.00  . Types: Cigarettes  . Last attempt to quit: 08/2015  . Years since quitting: 2.1  Smokeless Tobacco Never Used    Goals Met:  Independence with exercise equipment Exercise tolerated well No report of cardiac concerns or symptoms Strength training completed today  Goals Unmet:  Not Applicable  Comments: Pt able to follow exercise prescription today without complaint.  Will continue to monitor for progression.   Dr. Emily Filbert is Medical Director for [place] and [name].

## 2017-10-11 NOTE — Progress Notes (Signed)
Pulmonary Individual Treatment Plan  Patient Details  Name: Kevin Pearson MRN: [id number] Date of Birth: 1951/04/18 Referring Provider:     Pulmonary Rehab from 09/20/2017 in [facility] Cardiac [facility] Pulmonary Rehab  Referring Provider  Marygrace Drought MD      Initial Encounter Date:    Pulmonary Rehab from 09/20/2017 in [facility] Cardiac and Pulmonary Rehab  Date  09/20/17  Referring Provider  Marygrace Drought MD      Visit Diagnosis: COPD, mild ([place])  Patient's Home Medications on Admission:  Current Outpatient Medications:  .  acetaminophen (TYLENOL) 325 MG tablet, Take 650 mg by mouth every 6 (six) hours as needed., Disp: , Rfl:  .  albuterol (PROVENTIL HFA;VENTOLIN HFA) 108 (90 Base) MCG/ACT inhaler, Inhale 2 puffs into the lungs every 4 (four) hours as needed for shortness of breath., Disp: , Rfl:  .  budesonide (PULMICORT) 0.5 MG/2ML nebulizer solution, Inhale 2 mLs into the lungs daily., Disp: , Rfl: 1 .  busPIRone (BUSPAR) 10 MG tablet, Take 10 mg by mouth 3 (three) times daily., Disp: , Rfl:  .  clopidogrel (PLAVIX) 75 MG tablet, Take 75 mg by mouth daily., Disp: , Rfl:  .  divalproex (DEPAKOTE) 500 MG DR tablet, Take 500 mg by mouth 2 (two) times daily. , Disp: , Rfl:  .  furosemide (LASIX) 40 MG tablet, Take 60 mg by mouth 2 (two) times daily. , Disp: , Rfl: 0 .  gabapentin (NEURONTIN) 300 MG capsule, Take 600 mg by mouth 2 (two) times daily. , Disp: , Rfl:  .  ipratropium-albuterol (DUONEB) 0.5-2.5 (3) MG/3ML SOLN, Take 3 mLs by nebulization every 6 (six) hours as needed (wheezing/shortness of breath)., Disp: , Rfl:  .  Melatonin 10 MG TABS, Take 10 mg by mouth at bedtime. , Disp: , Rfl:  .  metoprolol succinate (TOPROL-XL) 25 MG 24 hr tablet, Take 25 mg by mouth daily., Disp: , Rfl:  .  nitroGLYCERIN (NITROSTAT) 0.4 MG SL tablet, Place 0.4 mg under the tongue every 5 (five) minutes as needed for chest pain., Disp: , Rfl:  .  omeprazole (PRILOSEC) 40 MG capsule, Take 40 mg by mouth  daily., Disp: , Rfl:  .  rivaroxaban (XARELTO) 20 MG TABS tablet, Take 20 mg by mouth at bedtime., Disp: , Rfl:  .  rosuvastatin (CRESTOR) 20 MG tablet, Take 20 mg by mouth daily., Disp: , Rfl:  .  senna (SENOKOT) 8.6 MG tablet, Take 2 tablets by mouth 2 (two) times daily., Disp: , Rfl:  .  tamsulosin (FLOMAX) 0.4 MG CAPS capsule, TAKE ONE CAPSULE BY MOUTH EVERY DAY AFTER SUPPER, Disp: 30 capsule, Rfl: 11 .  vortioxetine HBr (TRINTELLIX) 5 MG TABS, Take by mouth., Disp: , Rfl:   Past Medical History: Past Medical History:  Diagnosis Date  . Acid reflux   . Anxiety   . Arthritis   . Atrial fibrillation ([place])   . CHF (congestive heart failure) ([place])   . Chronic orthostatic hypotension   . Clotting disorder ([place])   . COPD (chronic obstructive pulmonary disease) ([place])   . Depression   . Elevated PSA   . Heart attack ([place])   . Heart disease   . Heart failure ([place])   . Hepatitis   . High cholesterol   . Hypertension   . Sleep apnea   . TBI (traumatic brain injury) ([place])   . Urinary retention     Tobacco Use: Social History   Tobacco Use  Smoking Status Former Smoker  .  Packs/day: 1.00  . Years: 40.00  . Pack years: 40.00  . Types: Cigarettes  . Last attempt to quit: 08/2015  . Years since quitting: 2.1  Smokeless Tobacco Never Used    Labs: Recent Review Flowsheet Data    There is no flowsheet data to display.       Pulmonary Assessment Scores: Pulmonary Assessment Scores    Row Name 09/20/17 1444         ADL UCSD   ADL Phase  Entry     SOB Score total  96     Rest  2     Walk  5     Stairs  5     Bath  3     Dress  3     Shop  5       CAT Score   CAT Score  29       mMRC Score   mMRC Score  2        Pulmonary Function Assessment: Pulmonary Function Assessment - 09/20/17 1500      Initial Spirometry Results   FVC%  84 %    FEV1%  83 %    FEV1/FVC Ratio  74.28    Comments  good patient effort best of 2      Post Bronchodilator Spirometry  Results   FVC%  91.83 %    FEV1%  100.43 %    FEV1/FVC Ratio  82.17    Comments  good patient      Breath   Bilateral Breath Sounds  Clear    Shortness of Breath  Yes;Limiting activity;Fear of Shortness of Breath;Panic with Shortness of Breath       Exercise Target Goals:    Exercise Program Goal: Individual exercise prescription set using results from initial 6 min walk test and THRR while considering  patient's activity barriers and safety.    Exercise Prescription Goal: Initial exercise prescription builds to 30-45 minutes a day of aerobic activity, 2-3 days per week.  Home exercise guidelines will be given to patient during program as part of exercise prescription that the participant will acknowledge.  Activity Barriers & Risk Stratification: Activity Barriers & Cardiac Risk Stratification - 09/20/17 1558      Activity Barriers & Cardiac Risk Stratification   Activity Barriers  Deconditioning;Muscular Weakness;Shortness of Breath;Back Problems;Joint Problems;Decreased Ventricular Function;Balance Concerns;History of Falls l side residuals from accident       6 Minute Walk: 6 Minute Walk    Row Name 09/20/17 1555         6 Minute Walk   Phase  Initial     Distance  1002 feet     Walk Time  6 minutes     # of Rest Breaks  0     MPH  1.9     METS  1.97     RPE  15     Perceived Dyspnea   2     VO2 Peak  6.88     Symptoms  No     Resting HR  70 bpm     Resting BP  128/56     Resting Oxygen Saturation   99 %     Exercise Oxygen Saturation  during 6 min walk  97 %     Max Ex. HR  104 bpm     Max Ex. BP  136/84     2 Minute Post BP  134/74       Interval  HR   1 Minute HR  92     2 Minute HR  91     3 Minute HR  93     4 Minute HR  89     5 Minute HR  97     6 Minute HR  104     2 Minute Post HR  72     Interval Heart Rate?  Yes       Interval Oxygen   Interval Oxygen?  Yes     Baseline Oxygen Saturation %  99 %     1 Minute Oxygen Saturation %  97 %      1 Minute Liters of Oxygen  0 L Room Air     2 Minute Oxygen Saturation %  97 %     2 Minute Liters of Oxygen  0 L     3 Minute Oxygen Saturation %  97 %     3 Minute Liters of Oxygen  0 L     4 Minute Oxygen Saturation %  98 %     4 Minute Liters of Oxygen  0 L     5 Minute Oxygen Saturation %  97 %     5 Minute Liters of Oxygen  0 L     6 Minute Oxygen Saturation %  97 %     6 Minute Liters of Oxygen  0 L     2 Minute Post Oxygen Saturation %  99 %     2 Minute Post Liters of Oxygen  0 L       Oxygen Initial Assessment: Oxygen Initial Assessment - 09/20/17 1450      Home Oxygen   Home Oxygen Device  Home Concentrator states he has three machines. He states he has not been using oxygen the last 6-7 weeks    Sleep Oxygen Prescription  None    Home Exercise Oxygen Prescription  None    Home at Rest Exercise Oxygen Prescription  None    Compliance with Home Oxygen Use  Yes      Initial 6 min Walk   Oxygen Used  None      Program Oxygen Prescription   Program Oxygen Prescription  None      Intervention   Short Term Goals  To learn and understand importance of maintaining oxygen saturations>88%;To learn and demonstrate proper use of respiratory medications;To learn and demonstrate proper pursed lip breathing techniques or other breathing techniques.;To learn and understand importance of monitoring SPO2 with pulse oximeter and demonstrate accurate use of the pulse oximeter.    Long  Term Goals  Verbalizes importance of monitoring SPO2 with pulse oximeter and return demonstration;Maintenance of O2 saturations>88%;Exhibits proper breathing techniques, such as pursed lip breathing or other method taught during program session;Compliance with respiratory medication;Demonstrates proper use of MDI's       Oxygen Re-Evaluation: Oxygen Re-Evaluation    Row Name 09/27/17 1019             Program Oxygen Prescription   Program Oxygen Prescription  None         Home Oxygen   Home  Oxygen Device  Home Concentrator       Sleep Oxygen Prescription  None       Home Exercise Oxygen Prescription  None       Home at Rest Exercise Oxygen Prescription  None       Compliance with Home Oxygen Use  Yes  Goals/Expected Outcomes   Short Term Goals  To learn and understand importance of maintaining oxygen saturations>88%;To learn and demonstrate proper use of respiratory medications;To learn and demonstrate proper pursed lip breathing techniques or other breathing techniques.;To learn and understand importance of monitoring SPO2 with pulse oximeter and demonstrate accurate use of the pulse oximeter.       Long  Term Goals  Verbalizes importance of monitoring SPO2 with pulse oximeter and return demonstration;Maintenance of O2 saturations>88%;Exhibits proper breathing techniques, such as pursed lip breathing or other method taught during program session;Compliance with respiratory medication;Demonstrates proper use of MDI's       Comments  Reviewed PLB technique with pt.  Talked about how it work and it's important to maintaining his exercise saturations.         Goals/Expected Outcomes  Short: Become more profiecient at using PLB.   Long: Become independent at using PLB.          Oxygen Discharge (Final Oxygen Re-Evaluation): Oxygen Re-Evaluation - 09/27/17 1019      Program Oxygen Prescription   Program Oxygen Prescription  None      Home Oxygen   Home Oxygen Device  Home Concentrator    Sleep Oxygen Prescription  None    Home Exercise Oxygen Prescription  None    Home at Rest Exercise Oxygen Prescription  None    Compliance with Home Oxygen Use  Yes      Goals/Expected Outcomes   Short Term Goals  To learn and understand importance of maintaining oxygen saturations>88%;To learn and demonstrate proper use of respiratory medications;To learn and demonstrate proper pursed lip breathing techniques or other breathing techniques.;To learn and understand importance of  monitoring SPO2 with pulse oximeter and demonstrate accurate use of the pulse oximeter.    Long  Term Goals  Verbalizes importance of monitoring SPO2 with pulse oximeter and return demonstration;Maintenance of O2 saturations>88%;Exhibits proper breathing techniques, such as pursed lip breathing or other method taught during program session;Compliance with respiratory medication;Demonstrates proper use of MDI's    Comments  Reviewed PLB technique with pt.  Talked about how it work and it's important to maintaining his exercise saturations.      Goals/Expected Outcomes  Short: Become more profiecient at using PLB.   Long: Become independent at using PLB.       Initial Exercise Prescription: Initial Exercise Prescription - 09/20/17 1500      Date of Initial Exercise RX and Referring Provider   Date  09/20/17    Referring Provider  Marygrace Drought MD      Treadmill   MPH  1.3    Grade  0    Minutes  15    METs  2      Recumbant Bike   Level  1    RPM  50    Watts  10    Minutes  15    METs  2      NuStep   Level  1    SPM  80    Minutes  15    METs  2      Prescription Details   Frequency (times per week)  3    Duration  Progress to 45 minutes of aerobic exercise without signs/symptoms of physical distress      Intensity   THRR 40-80% of Max Heartrate  104-138    Ratings of Perceived Exertion  11-13    Perceived Dyspnea  0-4      Progression  Progression  Continue to progress workloads to maintain intensity without signs/symptoms of physical distress.      Resistance Training   Training Prescription  Yes    Weight  3 lbs    Reps  10-15       Perform Capillary Blood Glucose checks as needed.  Exercise Prescription Changes: Exercise Prescription Changes    Row Name 09/20/17 1500 09/29/17 1100           Response to Exercise   Blood Pressure (Admit)  128/56  112/66      Blood Pressure (Exercise)  136/84  174/84      Blood Pressure (Exit)  134/74  102/68       Heart Rate (Admit)  70 bpm  70 bpm      Heart Rate (Exercise)  104 bpm  78 bpm      Heart Rate (Exit)  72 bpm  70 bpm      Oxygen Saturation (Admit)  99 %  99 %      Oxygen Saturation (Exercise)  97 %  98 %      Oxygen Saturation (Exit)  99 %  99 %      Rating of Perceived Exertion (Exercise)  15  12      Perceived Dyspnea (Exercise)  2  3      Symptoms  none  -      Comments  walk test results  -      Duration  -  Progress to 45 minutes of aerobic exercise without signs/symptoms of physical distress      Intensity  -  THRR unchanged        Progression   Progression  -  Continue to progress workloads to maintain intensity without signs/symptoms of physical distress.      Average METs  -  1.7        Resistance Training   Training Prescription  -  Yes      Weight  -  3 lb      Reps  -  10-15        Interval Training   Interval Training  -  No        Recumbant Bike   Level  -  1      RPM  -  50      Watts  -  12      Minutes  -  15      METs  -  1.7        NuStep   Level  -  1      SPM  -  80      Minutes  -  15      METs  -  1.7        Home Exercise Plan   Plans to continue exercise at  [facility] (comment) water aerobics at [place] 2 additional days to program exercise sessions.      Initial Home Exercises Provided  -  09/29/17         Exercise Comments: Exercise Comments    Row Name 09/27/17 1018           Exercise Comments  First full day of exercise!  Patient was oriented to gym and equipment including functions, settings, policies, and procedures.  Patient's individual exercise prescription and treatment plan were reviewed.  All starting workloads were established based  on the results of the 6 minute walk test done at initial orientation visit.  The plan for exercise progression was also introduced and progression will be customized based on patient's performance and goals.          Exercise Goals and  Review: Exercise Goals    Row Name 09/20/17 1602             Exercise Goals   Increase Physical Activity  Yes       Intervention  Provide advice, education, support and counseling about physical activity/exercise needs.;Develop an individualized exercise prescription for aerobic and resistive training based on initial evaluation findings, risk stratification, comorbidities and participant's personal goals.       Expected Outcomes  Short Term: Attend rehab on a regular basis to increase amount of physical activity.;Long Term: Add in home exercise to make exercise part of routine and to increase amount of physical activity.;Long Term: Exercising regularly at least 3-5 days a week.       Increase Strength and Stamina  Yes       Intervention  Provide advice, education, support and counseling about physical activity/exercise needs.;Develop an individualized exercise prescription for aerobic and resistive training based on initial evaluation findings, risk stratification, comorbidities and participant's personal goals.       Expected Outcomes  Short Term: Increase workloads from initial exercise prescription for resistance, speed, and METs.;Short Term: Perform resistance training exercises routinely during rehab and add in resistance training at home;Long Term: Improve cardiorespiratory fitness, muscular endurance and strength as measured by increased METs and functional capacity (6MWT)       Able to understand and use rate of perceived exertion (RPE) scale  Yes       Intervention  Provide education and explanation on how to use RPE scale       Expected Outcomes  Short Term: Able to use RPE daily in rehab to express subjective intensity level;Long Term:  Able to use RPE to guide intensity level when exercising independently       Able to understand and use Dyspnea scale  Yes       Intervention  Provide education and explanation on how to use Dyspnea scale       Expected Outcomes  Short Term: Able to  use Dyspnea scale daily in rehab to express subjective sense of shortness of breath during exertion;Long Term: Able to use Dyspnea scale to guide intensity level when exercising independently       Knowledge and understanding of Target Heart Rate Range (THRR)  Yes       Intervention  Provide education and explanation of THRR including how the numbers were predicted and where they are located for reference       Expected Outcomes  Short Term: Able to state/look up THRR;Long Term: Able to use THRR to govern intensity when exercising independently;Short Term: Able to use daily as guideline for intensity in rehab       Able to check pulse independently  Yes       Intervention  Provide education and demonstration on how to check pulse in carotid and radial arteries.;Review the importance of being able to check your own pulse for safety during independent exercise       Expected Outcomes  Short Term: Able to explain why pulse checking is important during independent exercise;Long Term: Able to check pulse independently and accurately       Understanding of Exercise Prescription  Yes  Intervention  Provide education, explanation, and written materials on patient's individual exercise prescription       Expected Outcomes  Short Term: Able to explain program exercise prescription;Long Term: Able to explain home exercise prescription to exercise independently          Exercise Goals Re-Evaluation : Exercise Goals Re-Evaluation    Row Name 09/27/17 1018 09/29/17 1140           Exercise Goal Re-Evaluation   Exercise Goals Review  Understanding of Exercise Prescription;Able to understand and use Dyspnea scale;Knowledge and understanding of Target Heart Rate Range (THRR);Able to understand and use rate of perceived exertion (RPE) scale  Increase Physical Activity;Increase Strength and Stamina;Able to understand and use rate of perceived exertion (RPE) scale;Knowledge and understanding of Target Heart  Rate Range (THRR);Able to check pulse independently;Understanding of Exercise Prescription      Comments  Reviewed RPE scale, THR and program prescription with pt today.  Pt voiced understanding and was given a copy of goals to take home.   Reviewed home exercise with pt including HR, RPE O2.  He plasn to do water aerobics at a gym close to his home.      Expected Outcomes  Short: Use RPE daily to regulate intensity.  Long: Follow program prescription in THR.  Short - [place] will join the gym and begin Water exercise [place] will exrecie on his own 3-5 times per week         Discharge Exercise Prescription (Final Exercise Prescription Changes): Exercise Prescription Changes - 09/29/17 1100      Response to Exercise   Blood Pressure (Admit)  112/66    Blood Pressure (Exercise)  174/84    Blood Pressure (Exit)  102/68    Heart Rate (Admit)  70 bpm    Heart Rate (Exercise)  78 bpm    Heart Rate (Exit)  70 bpm    Oxygen Saturation (Admit)  99 %    Oxygen Saturation (Exercise)  98 %    Oxygen Saturation (Exit)  99 %    Rating of Perceived Exertion (Exercise)  12    Perceived Dyspnea (Exercise)  3    Duration  Progress to 45 minutes of aerobic exercise without signs/symptoms of physical distress    Intensity  THRR unchanged      Progression   Progression  Continue to progress workloads to maintain intensity without signs/symptoms of physical distress.    Average METs  1.7      Resistance Training   Training Prescription  Yes    Weight  3 lb    Reps  10-15      Interval Training   Interval Training  No      Recumbant Bike   Level  1    RPM  50    Watts  12    Minutes  15    METs  1.7      NuStep   Level  1    SPM  80    Minutes  15    METs  1.7      Home Exercise Plan   Plans to continue exercise at  [employer] (comment) water aerobics at [place] 2 additional days to program exercise sessions.    Initial Home Exercises Provided   09/29/17       Nutrition:  Target Goals: Understanding of nutrition guidelines, daily intake of sodium '1500mg'$ , cholesterol '200mg'$ , calories 30% from  fat and 7% or less from saturated fats, daily to have 5 or more servings of fruits and vegetables.  Biometrics: Pre Biometrics - 09/20/17 1602      Pre Biometrics   Height  5' 7.4" (1.712 m)    Weight  262 lb 3.2 oz (118.9 kg)    Waist Circumference  45 inches    Hip Circumference  46 inches    Waist to Hip Ratio  0.98 %    BMI (Calculated)  40.58        Nutrition Therapy Plan and Nutrition Goals: Nutrition Therapy & Goals - 09/20/17 1443      Personal Nutrition Goals   Comments  Eat healthier and Lose weight.      Intervention Plan   Intervention  Prescribe, educate and counsel regarding individualized specific dietary modifications aiming towards targeted core components such as weight, hypertension, lipid management, diabetes, heart failure and other comorbidities.;Nutrition handout(s) given to patient.    Expected Outcomes  Short Term Goal: Understand basic principles of dietary content, such as calories, fat, sodium, cholesterol and nutrients.;Long Term Goal: Adherence to prescribed nutrition plan.       Nutrition Assessments: Nutrition Assessments - 09/20/17 1449      MEDFICTS Scores   Pre Score  60       Nutrition Goals Re-Evaluation:   Nutrition Goals Discharge (Final Nutrition Goals Re-Evaluation):   Psychosocial: Target Goals: Acknowledge presence or absence of significant depression and/or stress, maximize coping skills, provide positive support system. Participant is able to verbalize types and ability to use techniques and skills needed for reducing stress and depression.   Initial Review & Psychosocial Screening: Initial Psych Review & Screening - 09/20/17 1439      Initial Review   Current issues with  History of Depression;Current Anxiety/Panic;Current Sleep Concerns;Current Stress Concerns;Current  Depression    Source of Stress Concerns  Chronic Illness;Unable to participate in former interests or hobbies    Comments  He uses medication to help him sleep and sees a psychologist.      [place]?  Yes    Comments  His daughter and ex-wife are a good source for support.       Quality of Life Scores:  Scores of 19 and below usually indicate a poorer quality of life in these areas.  A difference of  2-3 points is a clinically meaningful difference.  A difference of 2-3 points in the total score of the Quality of Life Index has been associated with significant improvement in overall quality of life, self-image, physical symptoms, and general health in studies assessing change in quality of life.  PHQ-9: Recent Review Flowsheet Data    Depression screen [facility] 2/9 10/06/2017 09/23/2017 09/20/2017 09/20/2017 06/21/2017   Decreased Interest 2 - 3 0 0   Down, Depressed, Hopeless '1 1 2 '$ 0 1   PHQ - 2 Score '3 1 5 '$ 0 1   Altered sleeping 2 - 2 - -   Tired, decreased energy 2 - 2 - -   Change in appetite 2 - 3 - -   Feeling bad or failure about yourself  2 - 2 - -   Trouble concentrating 1 - 1 - -   Moving slowly or fidgety/restless 1 - 1 - -   Suicidal thoughts 1 0 1  - -   PHQ-9 Score 14 - 17 - -   Difficult doing work/chores - - Very difficult - -  Interpretation of Total Score  Total Score Depression Severity:  1-4 = Minimal depression, 5-9 = Mild depression, 10-14 = Moderate depression, 15-19 = Moderately severe depression, 20-27 = Severe depression   Psychosocial Evaluation and Intervention: Psychosocial Evaluation - 10/06/17 1110      Psychosocial Evaluation & Interventions   Interventions  Encouraged to exercise with the program and follow exercise prescription;Stress management education;Relaxation education    Comments  Mr. Danielsen Bisig) has returned to this program due to more difficulty breathing and multiple cardiac and pulmonary health problems.   Counselor met with him today for initial psychosocial evaluation.  He is a 67 year old who has a strong support system with his daughter who lives close by and his ex-wife who is in the same community and they have become friends.  Kitt reports not sleeping well - approximately 5 hours/night intermittent sleep and states his appetite is "too good."  He has a history of depression and anxiety and has experienced some recent panic symptoms.  He is on medications for sleep and mood and reports an addition to these meds recently which he can already see a positive result in his mood.  He has multiple stressors with his health and some family conflict - particularly with his estranged son.  [place] has goals to improve his breathing; be healthier overall; and lose some weight.  He has joined a gym to begin swimming for exercise as well and is looking for a local weight watchers group.  Counselor provided information on a local group for [place] to consider.  His PHQ-9 initially at orientation on 3/19 was "17" indicating moderately severe symptoms of depression may be present.  Counselor reviewed with Tabb and the score has decreased to a "14" in the moderate range with improvement noted and new medications have been introduced for this.  Counselor will follow with Nichols throughout the course of this program.      Expected Outcomes  [place] will exercise for his physical and mental health.  Short - Achille will look into weight watchers to help with his appetite and weight loss goals.      Continue Psychosocial Services   Follow up required by counselor       Psychosocial Re-Evaluation:   Psychosocial Discharge (Final Psychosocial Re-Evaluation):   Education: Education Goals: Education classes will be provided on a weekly basis, covering required topics. Participant will state understanding/return demonstration of topics presented.  Learning Barriers/Preferences: Learning Barriers/Preferences -  09/20/17 1447      Learning Barriers/Preferences   Learning Barriers  Hearing    Learning Preferences  Skilled Demonstration       Education Topics:  Initial Evaluation Education: - Verbal, written and demonstration of respiratory meds, oximetry and breathing techniques. Instruction on use of nebulizers and MDIs and importance of monitoring MDI activations.   Pulmonary Rehab from 09/29/2017 in [facility] Cardiac and Pulmonary Rehab  Date  09/20/17  Educator  [facility]  Instruction Review Code  1- Verbalizes Understanding      General Nutrition Guidelines/Fats and Fiber: -Group instruction provided by verbal, written material, models and posters to present the general guidelines for heart healthy nutrition. Gives an explanation and review of dietary fats and fiber.   Pulmonary Rehab from 09/29/2017 in [facility] Cardiac and Pulmonary Rehab  Date  09/27/17  Educator  CR  Instruction Review Code  1- Verbalizes Understanding      Controlling Sodium/Reading Food Labels: -Group verbal and written material supporting the discussion of sodium  use in heart healthy nutrition. Review and explanation with models, verbal and written materials for utilization of the food label.   Exercise Physiology & General Exercise Guidelines: - Group verbal and written instruction with models to review the exercise physiology of the cardiovascular system and associated critical values. Provides general exercise guidelines with specific guidelines to those with heart or lung disease.    Cardiac Rehab from 10/27/2016 in [facility] Cardiac and Pulmonary Rehab  Date  09/22/16  Educator  [facility]  Instruction Review Code (retired)  2- meets goals/outcomes      Aerobic Exercise & Resistance Training: - Gives group verbal and written instruction on the various components of exercise. Focuses on aerobic and resistive training programs and the benefits of this training and how to safely progress through these programs.   Flexibility,  Balance, Mind/Body Relaxation: Provides group verbal/written instruction on the benefits of flexibility and balance training, including mind/body exercise modes such as yoga, pilates and tai chi.  Demonstration and skill practice provided.   Cardiac Rehab from 10/27/2016 in [facility] Cardiac and Pulmonary Rehab  Date  09/29/16  Educator  [facility]  Instruction Review Code (retired)  2- meets goals/outcomes      Stress and Anxiety: - Provides group verbal and written instruction about the health risks of elevated stress and causes of high stress.  Discuss the correlation between heart/lung disease and anxiety and treatment options. Review healthy ways to manage with stress and anxiety.   Cardiac Rehab from 10/27/2016 in [facility] Cardiac and Pulmonary Rehab  Date  08/13/16  Educator  TS  Instruction Review Code (retired)  2- meets goals/outcomes      Depression: - Provides group verbal and written instruction on the correlation between heart/lung disease and depressed mood, treatment options, and the stigmas associated with seeking treatment.   Cardiac Rehab from 10/27/2016 in [facility] Cardiac and Pulmonary Rehab  Date  09/10/16  Educator  TS  Instruction Review Code (retired)  2- meets goals/outcomes      Exercise & Equipment Safety: - Individual verbal instruction and demonstration of equipment use and safety with use of the equipment.   Pulmonary Rehab from 09/29/2017 in [facility] Cardiac and Pulmonary Rehab  Date  09/20/17  Educator  [facility]  Instruction Review Code  1- Verbalizes Understanding      Infection Prevention: - Provides verbal and written material to individual with discussion of infection control including proper hand washing and proper equipment cleaning during exercise session.   Pulmonary Rehab from 09/29/2017 in [facility] Cardiac and Pulmonary Rehab  Date  09/20/17  Educator  [facility]  Instruction Review Code  1- Verbalizes Understanding      Falls Prevention: - Provides verbal and written  material to individual with discussion of falls prevention and safety.   Pulmonary Rehab from 09/29/2017 in [facility] Cardiac and Pulmonary Rehab  Date  09/20/17  Educator  [facility]  Instruction Review Code  1- Verbalizes Understanding      Diabetes: - Individual verbal and written instruction to review signs/symptoms of diabetes, desired ranges of glucose level fasting, after meals and with exercise. Advice that pre and post exercise glucose checks will be done for 3 sessions at entry of program.   Chronic Lung Diseases: - Group verbal and written instruction to review updates, respiratory medications, advancements in procedures and treatments. Discuss use of supplemental oxygen including available portable oxygen systems, continuous and intermittent flow rates, concentrators, personal use and safety guidelines. Review proper use of inhaler and spacers. Provide informative websites  for self-education.    Pulmonary Rehab from 09/29/2017 in [facility] Cardiac and Pulmonary Rehab  Date  09/29/17  Educator  [facility]  Instruction Review Code  1- Verbalizes Understanding      Energy Conservation: - Provide group verbal and written instruction for methods to conserve energy, plan and organize activities. Instruct on pacing techniques, use of adaptive equipment and posture/positioning to relieve shortness of breath.   Triggers and Exacerbations: - Group verbal and written instruction to review types of environmental triggers and ways to prevent exacerbations. Discuss weather changes, air quality and the benefits of nasal washing. Review warning signs and symptoms to help prevent infections. Discuss techniques for effective airway clearance, coughing, and vibrations.   AED/CPR: - Group verbal and written instruction with the use of models to demonstrate the basic use of the AED with the basic ABC's of resuscitation.   Anatomy and Physiology of the Lungs: - Group verbal and written instruction with the use of models  to provide basic lung anatomy and physiology related to function, structure and complications of lung disease.   Anatomy & Physiology of the Heart: - Group verbal and written instruction and models provide basic cardiac anatomy and physiology, with the coronary electrical and arterial systems. Review of Valvular disease and Heart Failure   Cardiac Rehab from 10/27/2016 in [facility] Cardiac and Pulmonary Rehab  Date  10/08/16  Educator  [facility]  Instruction Review Code (retired)  2- meets goals/outcomes      Cardiac Medications: - Group verbal and written instruction to review commonly prescribed medications for heart disease. Reviews the medication, class of the drug, and side effects.   Cardiac Rehab from 10/27/2016 in [facility] Cardiac and Pulmonary Rehab  Date  10/22/16  Educator  [place]  Instruction Review Code (retired)  2- meets Chiropodist 1]      Know Your Numbers and Risk Factors: -Group verbal and written instruction about important numbers in your health.  Discussion of what are risk factors and how they play a role in the disease process.  Review of Cholesterol, Blood Pressure, Diabetes, and BMI and the role they play in your overall health.   Sleep Hygiene: -Provides group verbal and written instruction about how sleep can affect your health.  Define sleep hygiene, discuss sleep cycles and impact of sleep habits. Review good sleep hygiene tips.    Other: -Provides group and verbal instruction on various topics (see comments)    Knowledge Questionnaire Score: Knowledge Questionnaire Score - 09/20/17 1447      Knowledge Questionnaire Score   Pre Score  17/18 reviewed with patient        Core Components/Risk Factors/Patient Goals at Admission: Personal Goals and Risk Factors at Admission - 09/20/17 1451      Core Components/Risk Factors/Patient Goals on Admission    Weight Management  Yes;Obesity;Weight Loss    Intervention  Weight Management: Develop a combined nutrition  and exercise program designed to reach desired caloric intake, while maintaining appropriate intake of nutrient and fiber, sodium and fats, and appropriate energy expenditure required for the weight goal.;Weight Management: Provide education and appropriate resources to help participant work on and attain dietary goals.;Weight Management/Obesity: Establish reasonable short term and long term weight goals.    Admit Weight  262 lb 3.2 oz (118.9 kg)    Goal Weight: Short Term  257 lb (116.6 kg)    Goal Weight: Long Term  180 lb (81.6 kg)    Expected Outcomes  Short Term: Continue to assess  and modify interventions until short term weight is achieved;Long Term: Adherence to nutrition and physical activity/exercise program aimed toward attainment of established weight goal;Weight Maintenance: Understanding of the daily nutrition guidelines, which includes 25-35% calories from fat, 7% or less cal from saturated fats, less than '200mg'$  cholesterol, less than 1.5gm of sodium, & 5 or more servings of fruits and vegetables daily;Weight Loss: Understanding of general recommendations for a balanced deficit meal plan, which promotes 1-2 lb weight loss per week and includes a negative energy balance of [phone] kcal/d;Understanding recommendations for meals to include 15-35% energy as protein, 25-35% energy from fat, 35-60% energy from carbohydrates, less than '200mg'$  of dietary cholesterol, 20-35 gm of total fiber daily;Understanding of distribution of calorie intake throughout the day with the consumption of 4-5 meals/snacks    Improve shortness of breath with ADL's  Yes    Intervention  Provide education, individualized exercise plan and daily activity instruction to help decrease symptoms of SOB with activities of daily living.    Expected Outcomes  Short Term: Improve cardiorespiratory fitness to achieve a reduction of symptoms when performing ADLs;Long Term: Be able to perform more ADLs without symptoms or delay the  onset of symptoms    Heart Failure  Yes    Intervention  Provide a combined exercise and nutrition program that is supplemented with education, support and counseling about heart failure. Directed toward relieving symptoms such as shortness of breath, decreased exercise tolerance, and extremity edema.    Expected Outcomes  Improve functional capacity of life;Short term: Attendance in program 2-3 days a week with increased exercise capacity. Reported lower sodium intake. Reported increased fruit and vegetable intake. Reports medication compliance.;Short term: Daily weights obtained and reported for increase. Utilizing diuretic protocols set by physician.;Long term: Adoption of self-care skills and reduction of barriers for early signs and symptoms recognition and intervention leading to self-care maintenance.    Hypertension  Yes    Intervention  Provide education on lifestyle modifcations including regular physical activity/exercise, weight management, moderate sodium restriction and increased consumption of fresh fruit, vegetables, and low fat dairy, alcohol moderation, and smoking cessation.;Monitor prescription use compliance.    Expected Outcomes  Short Term: Continued assessment and intervention until BP is < 140/72m HG in hypertensive participants. < 130/856mHG in hypertensive participants with diabetes, heart failure or chronic kidney disease.;Long Term: Maintenance of blood pressure at goal levels.    Lipids  Yes    Intervention  Provide education and support for participant on nutrition & aerobic/resistive exercise along with prescribed medications to achieve LDL '70mg'$ , HDL >'40mg'$ .    Expected Outcomes  Short Term: Participant states understanding of desired cholesterol values and is compliant with medications prescribed. Participant is following exercise prescription and nutrition guidelines.;Long Term: Cholesterol controlled with medications as prescribed, with individualized exercise RX and  with personalized nutrition plan. Value goals: LDL < '70mg'$ , HDL > 40 mg.       Core Components/Risk Factors/Patient Goals Review:    Core Components/Risk Factors/Patient Goals at Discharge (Final Review):    ITP Comments: ITP Comments    Row Name 09/20/17 1411 10/11/17 0826         ITP Comments  Medical Evaluation completed. Chart sent for review and changes to Dr. MaEmily Filbertirector of LuGordonDiagnosis can be found in [facility] encounter 08/27/17   30 day review completed. ITP sent to Dr. MaEmily Filbertirector of LuOntarioContinue with ITP unless changes are made by physician  Comments: 30 day review

## 2017-10-12 ENCOUNTER — Other Ambulatory Visit: Payer: Medicare Other

## 2017-10-14 ENCOUNTER — Encounter
Admission: RE | Admit: 2017-10-14 | Discharge: 2017-10-14 | Disposition: A | Discharge status: Home / Self Care | Payer: Medicare Other | Source: Ambulatory Visit | Attending: Cardiovascular Disease | Admitting: Cardiovascular Disease

## 2017-10-14 DIAGNOSIS — R0602 Shortness of breath: Secondary | ICD-10-CM | POA: Diagnosis present

## 2017-10-14 MED ORDER — TECHNETIUM TC 99M TETROFOSMIN IV KIT
13.2500 | PACK | Freq: Once | INTRAVENOUS | Status: AC | PRN
  Administered 2017-10-14: 13.25 via INTRAVENOUS

## 2017-10-14 MED ORDER — TECHNETIUM TC 99M TETROFOSMIN IV KIT
32.9460 | PACK | Freq: Once | INTRAVENOUS | Status: AC | PRN
  Administered 2017-10-14: 32.946 via INTRAVENOUS

## 2017-10-14 MED ORDER — REGADENOSON 0.4 MG/5ML IV SOLN
0.4000 mg | Freq: Once | INTRAVENOUS | Status: AC
  Administered 2017-10-14: 0.4 mg via INTRAVENOUS
  Filled 2017-10-14: qty 5

## 2017-10-15 ENCOUNTER — Telehealth: Payer: Self-pay | Admitting: Cardiovascular Disease

## 2017-10-15 DIAGNOSIS — J449 Chronic obstructive pulmonary disease, unspecified: Secondary | ICD-10-CM

## 2017-10-15 NOTE — Telephone Encounter (Signed)
Results are pending physician review. We will call him with results once they have been confirmed.

## 2017-10-15 NOTE — Telephone Encounter (Signed)
I do not see this in your studies to read and just wanted to let you know patient has come by for his results.

## 2017-10-15 NOTE — Progress Notes (Signed)
Daily Session Note  Patient Details  Name: Kevin Pearson MRN: [id number] Date of Birth: 22-Aug-1950 Referring Provider:     Pulmonary Rehab from 09/20/2017 in [facility] Cardiac and Pulmonary Rehab  Referring Provider  Kevin Drought MD      Encounter Date: 10/15/2017  Check In: Session Check In - 10/15/17 0951      Check-In   Location  [facility]-Cardiac & Pulmonary Rehab    Staff Present  Kevin Pearson RCP,RRT,BSRT;Kevin Sherryll Burger, RN BSN;Kevin Luan Pulling, MA, RCEP, CCRP, Exercise Physiologist    Supervising physician immediately available to respond to emergencies  [name] immediately available ER MD    Physician(s)  Dr. Alfred Pearson and Kevin Pearson    Medication changes reported      No    Fall or balance concerns reported     No    Tobacco Cessation  No Change    Warm-up and Cool-down  Performed as group-led instruction    Resistance Training Performed  Yes    VAD Patient?  No      Pain Assessment   Currently in Pain?  No/denies          Social History   Tobacco Use  Smoking Status Former Smoker  . Packs/day: 1.00  . Years: 40.00  . Pack years: 40.00  . Types: Cigarettes  . Last attempt to quit: 08/2015  . Years since quitting: 2.1  Smokeless Tobacco Never Used    Goals Met:  Independence with exercise equipment Exercise tolerated well No report of cardiac concerns or symptoms Strength training completed today  Goals Unmet:  Not Applicable  Comments: Pt able to follow exercise prescription today without complaint.  Will continue to monitor for progression.   Dr. Emily Pearson is Medical Director for [name] and [name].

## 2017-10-15 NOTE — Telephone Encounter (Signed)
Spoke with Victorino DikeJennifer C and informed patient they are not in and they will call when available

## 2017-10-15 NOTE — Telephone Encounter (Signed)
Patient in lobby Would like know if results are in  Please advise

## 2017-10-16 LAB — NM MYOCAR MULTI W/SPECT W/WALL MOTION / EF
LV dias vol: 120 mL (ref 62–150)
LV sys vol: 65 mL
Peak HR: 73 {beats}/min
Percent HR: 47 %
Rest HR: 70 {beats}/min
SDS: 3
SRS: 7
SSS: 4
TID: 1.03

## 2017-10-18 DIAGNOSIS — J449 Chronic obstructive pulmonary disease, unspecified: Secondary | ICD-10-CM | POA: Diagnosis not present

## 2017-10-18 NOTE — Progress Notes (Signed)
Daily Session Note  Patient Details  Name: Kevin Pearson MRN: [id number] Date of Birth: September 13, 1950 Referring Provider:     Pulmonary Rehab from 09/20/2017 in [facility] Cardiac and Pulmonary Rehab  Referring Provider  Marygrace Drought MD      Encounter Date: 10/18/2017  Check In: Session Check In - 10/18/17 0957      Check-In   Location  [facility]-Cardiac & Pulmonary Rehab    Staff Present  Earlean Shawl, BS, ACSM CEP, Exercise Physiologist;Amanda Oletta Darter, BA, ACSM CEP, Exercise Physiologist;Joseph Flavia Shipper    Supervising physician immediately available to respond to emergencies  [name] immediately available ER MD    Physician(s)   Dr. Burlene Arnt and Joni Fears    Medication changes reported      No    Fall or balance concerns reported     No    Tobacco Cessation  No Change    Warm-up and Cool-down  Performed as group-led instruction    Resistance Training Performed  Yes    VAD Patient?  No      Pain Assessment   Currently in Pain?  No/denies          Social History   Tobacco Use  Smoking Status Former Smoker  . Packs/day: 1.00  . Years: 40.00  . Pack years: 40.00  . Types: Cigarettes  . Last attempt to quit: 08/2015  . Years since quitting: 2.2  Smokeless Tobacco Never Used    Goals Met:  Independence with exercise equipment Exercise tolerated well No report of cardiac concerns or symptoms Strength training completed today  Goals Unmet:  Not Applicable  Comments: Pt able to follow exercise prescription today without complaint.  Will continue to monitor for progression.   Dr. Emily Filbert is Medical Director for [place] and [name].
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
## 2017-10-21 ENCOUNTER — Encounter: Payer: Self-pay | Admitting: *Deleted

## 2017-10-21 ENCOUNTER — Ambulatory Visit (INDEPENDENT_AMBULATORY_CARE_PROVIDER_SITE_OTHER): Payer: Medicare Other

## 2017-10-21 ENCOUNTER — Encounter: Payer: Self-pay | Admitting: Cardiovascular Disease

## 2017-10-21 ENCOUNTER — Ambulatory Visit (INDEPENDENT_AMBULATORY_CARE_PROVIDER_SITE_OTHER): Payer: Medicare Other | Admitting: Cardiovascular Disease

## 2017-10-21 ENCOUNTER — Other Ambulatory Visit: Payer: Self-pay

## 2017-10-21 VITALS — BP 130/86 | HR 70 | Ht 68.0 in | Wt 262.2 lb

## 2017-10-21 DIAGNOSIS — I255 Ischemic cardiomyopathy: Secondary | ICD-10-CM | POA: Diagnosis not present

## 2017-10-21 DIAGNOSIS — I5022 Chronic systolic (congestive) heart failure: Secondary | ICD-10-CM | POA: Diagnosis not present

## 2017-10-21 DIAGNOSIS — R9439 Abnormal result of other cardiovascular function study: Secondary | ICD-10-CM | POA: Diagnosis not present

## 2017-10-21 DIAGNOSIS — R0602 Shortness of breath: Secondary | ICD-10-CM | POA: Insufficient documentation

## 2017-10-21 DIAGNOSIS — I200 Unstable angina: Secondary | ICD-10-CM | POA: Insufficient documentation

## 2017-10-21 DIAGNOSIS — J432 Centrilobular emphysema: Secondary | ICD-10-CM

## 2017-10-21 LAB — ECHOCARDIOGRAM COMPLETE
Height: 68 in
Weight: 4196 oz

## 2017-10-21 NOTE — Patient Instructions (Addendum)
Medication Instructions:   No medication changes made  Labwork: Labs done today for procedure.  Testing/Procedures: [facility] Cardiac Cath Instructions   You are scheduled for a Cardiac Cath on:__Wednesday April 24th__  Please arrive at _09:30_am on the day of your procedure  Please [facility] from our [facility] to pre-register you  Do not eat/drink anything after midnight  Someone will need to drive you home  It is recommended someone be with you for the first 24 hours after your procedure  Wear clothes that are easy to get on/off and wear slip on shoes if possible   Medications bring a current list of all medications with you  _XX__ You may take all of your other medications the morning of your procedure with enough water to swallow safely  _XX__ Do not take these medications Xarelto or Lasix for 2 days prior to your procedure.   Day of your procedure: [facility]  Free valet service is available.  After entering the Medical Mall please check-in at the registration desk (1st desk on your right) to receive your armband. After receiving your armband someone will escort you to the cardiac cath/special procedures waiting area.  The usual length of stay after your procedure is about 2 to 3 hours.  This can vary.  If you have any questions, please call our office at [phone], or you may call the cardiac cath lab at [facility] directly at [phone]  We will schedule a cardiac cath on Wednesday  for positive stress test, angina, Known CAD, previous stents  Stop xarelto 2 days prior to cardiac cath Hold the lasix 2 days prior to the cardiac cath   Follow-Up: It was a pleasure seeing you in the office today. Please call us if you have new issues that need to be addressed before your next appt.  [phone]  Your physician wants you to follow-up in: 1 month.    If you need a refill on your cardiac medications before your next  appointment, please call your pharmacy.  For educational health videos Log in to : [email or web address] Or : [email or web address], password : [email or web address]

## 2017-10-22 LAB — CBC WITH DIFFERENTIAL/PLATELET
Basophils Absolute: 0 10*3/uL (ref 0.0–0.2)
Basos: 0 %
EOS (ABSOLUTE): 0.1 10*3/uL (ref 0.0–0.4)
Eos: 2 %
Hematocrit: 39.2 % (ref 37.5–51.0)
Hemoglobin: 13.1 g/dL (ref 13.0–17.7)
Immature Grans (Abs): 0 10*3/uL (ref 0.0–0.1)
Immature Granulocytes: 0 %
Lymphocytes Absolute: 1.8 10*3/uL (ref 0.7–3.1)
Lymphs: 27 %
MCH: 29.4 pg (ref 26.6–33.0)
MCHC: 33.4 g/dL (ref 31.5–35.7)
MCV: 88 fL (ref 79–97)
Monocytes Absolute: 0.7 10*3/uL (ref 0.1–0.9)
Monocytes: 10 %
Neutrophils Absolute: 4.2 10*3/uL (ref 1.4–7.0)
Neutrophils: 61 %
Platelets: 270 10*3/uL (ref 150–379)
RBC: 4.45 x10E6/uL (ref 4.14–5.80)
RDW: 15.3 % (ref 12.3–15.4)
WBC: 6.9 10*3/uL (ref 3.4–10.8)

## 2017-10-22 LAB — BASIC METABOLIC PANEL
BUN/Creatinine Ratio: 16 (ref 10–24)
BUN: 19 mg/dL (ref 8–27)
CO2: 23 mmol/L (ref 20–29)
Calcium: 10 mg/dL (ref 8.6–10.2)
Chloride: 102 mmol/L (ref 96–106)
Creatinine, Ser: 1.19 mg/dL (ref 0.76–1.27)
GFR calc Af Amer: 74 mL/min/{1.73_m2} (ref >59)
GFR calc non Af Amer: 64 mL/min/{1.73_m2} (ref >59)
Glucose: 103 mg/dL — ABNORMAL HIGH (ref 65–99)
Potassium: 4.9 mmol/L (ref 3.5–5.2)
Sodium: 141 mmol/L (ref 134–144)

## 2017-10-26 ENCOUNTER — Other Ambulatory Visit: Payer: Self-pay | Admitting: Cardiovascular Disease

## 2017-10-26 DIAGNOSIS — I200 Unstable angina: Secondary | ICD-10-CM

## 2017-10-27 ENCOUNTER — Encounter: Payer: Self-pay | Admitting: *Deleted

## 2017-10-27 ENCOUNTER — Ambulatory Visit
Admission: RE | Admit: 2017-10-27 | Discharge: 2017-10-27 | Disposition: A | Discharge status: Home / Self Care | Payer: Medicare Other | Source: Ambulatory Visit | Attending: Cardiovascular Disease | Admitting: Cardiovascular Disease

## 2017-10-27 ENCOUNTER — Encounter
Admission: RE | Disposition: A | Discharge status: Home / Self Care | Payer: Self-pay | Source: Ambulatory Visit | Attending: Cardiovascular Disease

## 2017-10-27 DIAGNOSIS — I110 Hypertensive heart disease with heart failure: Secondary | ICD-10-CM | POA: Insufficient documentation

## 2017-10-27 DIAGNOSIS — Y838 Other surgical procedures as the cause of abnormal reaction of the patient, or of later complication, without mention of misadventure at the time of the procedure: Secondary | ICD-10-CM | POA: Diagnosis not present

## 2017-10-27 DIAGNOSIS — R600 Localized edema: Secondary | ICD-10-CM | POA: Insufficient documentation

## 2017-10-27 DIAGNOSIS — G4733 Obstructive sleep apnea (adult) (pediatric): Secondary | ICD-10-CM | POA: Insufficient documentation

## 2017-10-27 DIAGNOSIS — I340 Nonrheumatic mitral (valve) insufficiency: Secondary | ICD-10-CM | POA: Insufficient documentation

## 2017-10-27 DIAGNOSIS — Z87891 Personal history of nicotine dependence: Secondary | ICD-10-CM | POA: Insufficient documentation

## 2017-10-27 DIAGNOSIS — I255 Ischemic cardiomyopathy: Secondary | ICD-10-CM | POA: Insufficient documentation

## 2017-10-27 DIAGNOSIS — Z885 Allergy status to narcotic agent status: Secondary | ICD-10-CM | POA: Insufficient documentation

## 2017-10-27 DIAGNOSIS — T82855A Stenosis of coronary artery stent, initial encounter: Secondary | ICD-10-CM | POA: Diagnosis not present

## 2017-10-27 DIAGNOSIS — Z888 Allergy status to other drugs, medicaments and biological substances status: Secondary | ICD-10-CM | POA: Diagnosis not present

## 2017-10-27 DIAGNOSIS — I25110 Atherosclerotic heart disease of native coronary artery with unstable angina pectoris: Secondary | ICD-10-CM | POA: Diagnosis not present

## 2017-10-27 DIAGNOSIS — I252 Old myocardial infarction: Secondary | ICD-10-CM | POA: Diagnosis not present

## 2017-10-27 DIAGNOSIS — R9430 Abnormal result of cardiovascular function study, unspecified: Secondary | ICD-10-CM | POA: Insufficient documentation

## 2017-10-27 DIAGNOSIS — J432 Centrilobular emphysema: Secondary | ICD-10-CM | POA: Diagnosis not present

## 2017-10-27 DIAGNOSIS — Z9889 Other specified postprocedural states: Secondary | ICD-10-CM | POA: Diagnosis not present

## 2017-10-27 DIAGNOSIS — I5022 Chronic systolic (congestive) heart failure: Secondary | ICD-10-CM | POA: Diagnosis not present

## 2017-10-27 DIAGNOSIS — Z955 Presence of coronary angioplasty implant and graft: Secondary | ICD-10-CM | POA: Insufficient documentation

## 2017-10-27 DIAGNOSIS — K219 Gastro-esophageal reflux disease without esophagitis: Secondary | ICD-10-CM | POA: Diagnosis not present

## 2017-10-27 DIAGNOSIS — Z7902 Long term (current) use of antithrombotics/antiplatelets: Secondary | ICD-10-CM | POA: Diagnosis not present

## 2017-10-27 DIAGNOSIS — E785 Hyperlipidemia, unspecified: Secondary | ICD-10-CM | POA: Diagnosis not present

## 2017-10-27 DIAGNOSIS — I200 Unstable angina: Secondary | ICD-10-CM | POA: Diagnosis present

## 2017-10-27 DIAGNOSIS — B1920 Unspecified viral hepatitis C without hepatic coma: Secondary | ICD-10-CM | POA: Insufficient documentation

## 2017-10-27 DIAGNOSIS — Z87820 Personal history of traumatic brain injury: Secondary | ICD-10-CM | POA: Diagnosis not present

## 2017-10-27 DIAGNOSIS — Z79899 Other long term (current) drug therapy: Secondary | ICD-10-CM | POA: Diagnosis not present

## 2017-10-27 HISTORY — PX: LEFT HEART CATH AND CORONARY ANGIOGRAPHY: CATH118249

## 2017-10-27 LAB — CARDIAC CATHETERIZATION: Cath EF Quantitative: 40 %

## 2017-10-27 SURGERY — LEFT HEART CATH AND CORONARY ANGIOGRAPHY
Anesthesia: Moderate Sedation | Laterality: Left

## 2017-10-27 SURGERY — LEFT HEART CATH AND CORONARY ANGIOGRAPHY
Anesthesia: Moderate Sedation

## 2017-10-27 MED ORDER — MIDAZOLAM HCL 2 MG/2ML IJ SOLN
INTRAMUSCULAR | Status: AC
  Filled 2017-10-27: qty 2

## 2017-10-27 MED ORDER — IOPAMIDOL (ISOVUE-300) INJECTION 61%
INTRAVENOUS | Status: DC | PRN
  Administered 2017-10-27: 110 mL via INTRA_ARTERIAL

## 2017-10-27 MED ORDER — SODIUM CHLORIDE 0.9 % WEIGHT BASED INFUSION
3.0000 mL/kg/h | INTRAVENOUS | Status: AC
  Administered 2017-10-27: 3 mL/kg/h via INTRAVENOUS

## 2017-10-27 MED ORDER — ASPIRIN 81 MG PO CHEW
81.0000 mg | CHEWABLE_TABLET | ORAL | Status: AC
  Administered 2017-10-27: 81 mg via ORAL

## 2017-10-27 MED ORDER — FENTANYL CITRATE (PF) 100 MCG/2ML IJ SOLN
INTRAMUSCULAR | Status: DC | PRN
  Administered 2017-10-27 (×4): 50 ug via INTRAVENOUS

## 2017-10-27 MED ORDER — MIDAZOLAM HCL 2 MG/2ML IJ SOLN
1.0000 mg | Freq: Once | INTRAMUSCULAR | Status: AC
  Administered 2017-10-27: 1 mg via INTRAVENOUS

## 2017-10-27 MED ORDER — SODIUM CHLORIDE 0.9 % WEIGHT BASED INFUSION
1.0000 mL/kg/h | INTRAVENOUS | Status: DC
  Administered 2017-10-27: 1 mL/kg/h via INTRAVENOUS

## 2017-10-27 MED ORDER — FENTANYL CITRATE (PF) 100 MCG/2ML IJ SOLN
INTRAMUSCULAR | Status: AC
  Filled 2017-10-27: qty 2

## 2017-10-27 MED ORDER — MIDAZOLAM HCL 2 MG/2ML IJ SOLN
INTRAMUSCULAR | Status: DC | PRN
  Administered 2017-10-27 (×4): 1 mg via INTRAVENOUS

## 2017-10-27 MED ORDER — ASPIRIN 81 MG PO CHEW
CHEWABLE_TABLET | ORAL | Status: AC
  Filled 2017-10-27: qty 1

## 2017-10-27 SURGICAL SUPPLY — 10 items
CATH INFINITI 5FR ANG PIGTAIL (CATHETERS) ×2 IMPLANT
CATH INFINITI 5FR JL4 (CATHETERS) ×2 IMPLANT
CATH INFINITI JR4 5F (CATHETERS) ×2 IMPLANT
DEVICE CLOSURE MYNXGRIP 5F (Vascular Products) ×2 IMPLANT
KIT MANI 3VAL PERCEP (MISCELLANEOUS) ×2 IMPLANT
NEEDLE PERC 18GX7CM (NEEDLE) ×2 IMPLANT
NEEDLE SMART REG 18GX2-3/4 (NEEDLE) ×2 IMPLANT
PACK CARDIAC CATH (CUSTOM PROCEDURE TRAY) ×2 IMPLANT
SHEATH AVANTI 5FR X 11CM (SHEATH) ×2 IMPLANT
WIRE GUIDERIGHT .035X150 (WIRE) ×2 IMPLANT

## 2017-10-27 NOTE — Discharge Instructions (Signed)
Moderate Conscious Sedation, Adult, Care After °These instructions provide you with information about caring for yourself after your procedure. Your health care provider may also give you more specific instructions. Your treatment has been planned according to current medical practices, but problems sometimes occur. Call your health care provider if you have any problems or questions after your procedure. °What can I expect after the procedure? °After your procedure, it is common: °· To feel sleepy for several hours. °· To feel clumsy and have poor balance for several hours. °· To have poor judgment for several hours. °· To vomit if you eat too soon. ° °Follow these instructions at home: °For at least 24 hours after the procedure: ° °· Do not: °? Participate in activities where you could fall or become injured. °? Drive. °? Use heavy machinery. °? Drink alcohol. °? Take sleeping pills or medicines that cause drowsiness. °? Make important decisions or sign legal documents. °? Take care of children on your own. °· Rest. °Eating and drinking °· Follow the diet recommended by your health care provider. °· If you vomit: °? Drink water, juice, or soup when you can drink without vomiting. °? Make sure you have little or no nausea before eating solid foods. °General instructions °· Have a responsible adult stay with you until you are awake and alert. °· Take over-the-counter and prescription medicines only as told by your health care provider. °· If you smoke, do not smoke without supervision. °· Keep all follow-up visits as told by your health care provider. This is important. °Contact a health care provider if: °· You keep feeling nauseous or you keep vomiting. °· You feel light-headed. °· You develop a rash. °· You have a fever. °Get help right away if: °· You have trouble breathing. °This information is not intended to replace advice given to you by your health care provider. Make sure you discuss any questions you have  with your health care provider. °Document Released: 04/12/2013 Document Revised: 11/25/2015 Document Reviewed: 10/12/2015 °Elsevier Interactive Patient Education © 2018 Elsevier Inc. °Angiogram, Care After °This sheet gives you information about how to care for yourself after your procedure. Your health care provider may also give you more specific instructions. If you have problems or questions, contact your health care provider. °What can I expect after the procedure? °After the procedure, it is common to have bruising and tenderness at the catheter insertion area. °Follow these instructions at home: °Insertion site care °· Follow instructions from your health care provider about how to take care of your insertion site. Make sure you: °? Wash your hands with soap and water before you change your bandage (dressing). If soap and water are not available, use hand sanitizer. °? Change your dressing as told by your health care provider. °? Leave stitches (sutures), skin glue, or adhesive strips in place. These skin closures may need to stay in place for 2 weeks or longer. If adhesive strip edges start to loosen and curl up, you may trim the loose edges. Do not remove adhesive strips completely unless your health care provider tells you to do that. °· Do not take baths, swim, or use a hot tub until your health care provider approves. °· You may shower 24-48 hours after the procedure or as told by your health care provider. °? Gently wash the site with plain soap and water. °? Pat the area dry with a clean towel. °? Do not rub the site. This may cause bleeding. °· Do   not apply powder or lotion to the site. Keep the site clean and dry.  Check your insertion site every day for signs of infection. Check for: ? Redness, swelling, or pain. ? Fluid or blood. ? Warmth. ? Pus or a bad smell. Activity  Rest as told by your health care provider, usually for 1-2 days.  Do not lift anything that is heavier than 10 lbs.  (4.5 kg) or as told by your health care provider.  Do not drive for 24 hours if you were given a medicine to help you relax (sedative).  Do not drive or use heavy machinery while taking prescription pain medicine. General instructions  Return to your normal activities as told by your health care provider, usually in about a week. Ask your health care provider what activities are safe for you.  If the catheter site starts bleeding, lie flat and put pressure on the site. If the bleeding does not stop, get help right away. This is a medical emergency.  Drink enough fluid to keep your urine clear or pale yellow. This helps flush the contrast dye from your body.  Take over-the-counter and prescription medicines only as told by your health care provider.  Keep all follow-up visits as told by your health care provider. This is important. Contact a health care provider if:  You have a fever or chills.  You have redness, swelling, or pain around your insertion site.  You have fluid or blood coming from your insertion site.  The insertion site feels warm to the touch.  You have pus or a bad smell coming from your insertion site.  You have bruising around the insertion site.  You notice blood collecting in the tissue around the catheter site (hematoma). The hematoma may be painful to the touch. Get help right away if:  You have severe pain at the catheter insertion area.  The catheter insertion area swells very fast.  The catheter insertion area is bleeding, and the bleeding does not stop when you hold steady pressure on the area.  The area near or just beyond the catheter insertion site becomes pale, cool, tingly, or numb. These symptoms may represent a serious problem that is an emergency. Do not wait to see if the symptoms will go away. Get medical help right away. Call your local emergency services (911 in the [place] Do not drive yourself to the hospital. Summary  After the  procedure, it is common to have bruising and tenderness at the catheter insertion area.  After the procedure, it is important to rest and drink plenty of fluids.  Do not take baths, swim, or use a hot tub until your health care provider says it is okay to do so. You may shower 24-48 hours after the procedure or as told by your health care provider.  If the catheter site starts bleeding, lie flat and put pressure on the site. If the bleeding does not stop, get help right away. This is a medical emergency. This information is not intended to replace advice given to you by your health care provider. Make sure you discuss any questions you have with your health care provider. Document Released: 01/08/2005 Document Revised: 05/27/2016 Document Reviewed: 05/27/2016 Elsevier Interactive Patient Education  2018 [employer] Groin Insertion Instructions-If you lose feeling or develop tingling or pain in your leg or foot after the procedure, please walk around first.  If the discomfort does not improve , contact your physician and proceed to the nearest emergency  room.  Loss of feeling in your leg might mean that a blockage has formed in the artery and this can be appropriately treated.  Limit your activity for the next two days after your procedure.  Avoid stooping, bending, heavy lifting or exertion as this may put pressure on the insertion site.  Resume normal activities in 48 hours.  You may shower after 24 hours but avoid excessive warm water and do not scrub the site.  Remove clear dressing in 48 hours.  If you have had a closure device inserted, do not soak in a tub bath or a hot tub for at least one week.  No driving for 48 hours after discharge.  After the procedure, check the insertion site occasionally.  If any oozing occurs or there is apparent swelling, firm pressure over the site will prevent a bruise from forming.  You can not hurt anything by pressing directly on the site.  The pressure stops the  bleeding by allowing a small clot to form.  If the bleeding continues after the pressure has been applied for more than 15 minutes, call 911 or go to the nearest emergency room.    The x-ray dye causes you to pass a considerate amount of urine.  For this reason, you will be asked to drink plenty of liquids after the procedure to prevent dehydration.  You may resume you regular diet.  Avoid caffeine products.    For pain at the site of your procedure, take non-aspirin medicines such as Tylenol.  Medications: A. Hold Metformin for 48 hours if applicable.  B. Continue taking all your present medications at home unless your doctor prescribes any changes.

## 2017-10-28 ENCOUNTER — Encounter: Payer: Self-pay | Admitting: Cardiovascular Disease

## 2017-10-28 NOTE — H&P (Signed)
H&P Addendum, precardiac catheterization  Patient was seen and evaluated prior to Cardiac catheterization procedure Symptoms, prior testing details again confirmed with the patient Patient examined, no significant change from prior exam Lab work reviewed in detail personally by myself Patient understands risk and benefit of the procedure, willing to proceed  Signed, Tim Gollan, MD, Ph.D [facility]    

## 2017-11-03 ENCOUNTER — Encounter: Payer: Medicare Other | Attending: Family Medicine

## 2017-11-03 DIAGNOSIS — J449 Chronic obstructive pulmonary disease, unspecified: Secondary | ICD-10-CM | POA: Insufficient documentation
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
## 2017-11-04 ENCOUNTER — Ambulatory Visit: Payer: Medicare Other | Admitting: Cardiovascular Disease

## 2017-11-04 ENCOUNTER — Encounter: Payer: Self-pay | Admitting: Cardiovascular Disease

## 2017-11-04 VITALS — BP 130/90 | HR 72 | Ht 67.0 in | Wt 256.0 lb

## 2017-11-04 DIAGNOSIS — I255 Ischemic cardiomyopathy: Secondary | ICD-10-CM | POA: Diagnosis not present

## 2017-11-04 DIAGNOSIS — I5022 Chronic systolic (congestive) heart failure: Secondary | ICD-10-CM

## 2017-11-04 DIAGNOSIS — E782 Mixed hyperlipidemia: Secondary | ICD-10-CM | POA: Diagnosis not present

## 2017-11-04 DIAGNOSIS — J441 Chronic obstructive pulmonary disease with (acute) exacerbation: Secondary | ICD-10-CM

## 2017-11-04 DIAGNOSIS — I25118 Atherosclerotic heart disease of native coronary artery with other forms of angina pectoris: Secondary | ICD-10-CM | POA: Diagnosis not present

## 2017-11-04 DIAGNOSIS — N183 Chronic kidney disease, stage 3 unspecified: Secondary | ICD-10-CM

## 2017-11-04 DIAGNOSIS — R0989 Other specified symptoms and signs involving the circulatory and respiratory systems: Secondary | ICD-10-CM | POA: Diagnosis not present

## 2017-11-04 MED ORDER — LOSARTAN POTASSIUM 25 MG PO TABS: 25.0000 mg | ORAL_TABLET | Freq: Every day | ORAL | 11 refills | Status: DC

## 2017-11-04 MED ORDER — LOSARTAN POTASSIUM 25 MG PO TABS: 25.0000 mg | ORAL_TABLET | Freq: Every day | ORAL | 3 refills | Status: DC

## 2017-11-04 NOTE — Patient Instructions (Addendum)
Medication Instructions:   Please start losartan 25 mg daily  Labwork:  Liver and lipids when he see Dr. Graciela Husbands, fasting  Testing/Procedures:  We will check a carotid u/s for bruit Perhaps on day of appt with Dr. Graciela Husbands We will add losartan 25 mg daily  Follow-Up: It was a pleasure seeing you in the office today. Please call us if you have new issues that need to be addressed before your next appt.  [phone]  Your physician wants you to follow-up in: 6 months.  You will receive a reminder letter in the mail two months in advance. If you don't receive a letter, please call our office to schedule the follow-up appointment.  If you need a refill on your cardiac medications before your next appointment, please call your pharmacy.  For educational health videos Log in to : [email or web address] Or : [email or web address], password : [email or web address]

## 2017-11-05 DIAGNOSIS — J449 Chronic obstructive pulmonary disease, unspecified: Secondary | ICD-10-CM | POA: Diagnosis present

## 2017-11-05 NOTE — Progress Notes (Signed)
Daily Session Note  Patient Details  Name: Kevin Pearson MRN: [id number] Date of Birth: 04/04/1951 Referring Provider:     Pulmonary Rehab from 09/20/2017 in [facility]  Referring Provider  Marygrace Drought MD      Encounter Date: 11/05/2017  Check In: Session Check In - 11/05/17 1012      Check-In   Location  [facility]    Staff Present  Justin Mend RCP,RRT,BSRT;Amanda Oletta Darter, BA, ACSM CEP, Exercise Physiologist;Meredith Sherryll Burger, RN BSN    Supervising physician immediately available to respond to emergencies  [name] immediately available ER MD    Physician(s)  Dr. Alfred Levins and [facility]    Medication changes reported      No    Fall or balance concerns reported     No    Tobacco Cessation  No Change    Warm-up and Cool-down  Performed as group-led instruction    Resistance Training Performed  No patient late    VAD Patient?  No      Pain Assessment   Currently in Pain?  No/denies          Social History   Tobacco Use  Smoking Status Former Smoker  . Packs/day: 1.00  . Years: 40.00  . Pack years: 40.00  . Types: Cigarettes  . Last attempt to quit: 08/2015  . Years since quitting: 2.2  Smokeless Tobacco Never Used    Goals Met:  Independence with exercise equipment Exercise tolerated well No report of cardiac concerns or symptoms Strength training completed today  Goals Unmet:  Not Applicable  Comments: Pt able to follow exercise prescription today without complaint.  Will continue to monitor for progression.   Dr. Emily Filbert is Medical Director for [facility] and [name].

## 2017-11-08 DIAGNOSIS — J449 Chronic obstructive pulmonary disease, unspecified: Secondary | ICD-10-CM

## 2017-11-08 NOTE — Progress Notes (Signed)
Pulmonary Individual Treatment Plan  Patient Details  Name: Rishard Delange MRN: [id number] Date of Birth: 1951/01/05 Referring Provider:     Pulmonary Rehab from 09/20/2017 in [facility] Cardiac and Pulmonary Rehab  Referring Provider  Marygrace Drought MD      Initial Encounter Date:    Pulmonary Rehab from 09/20/2017 in [facility] Cardiac and Pulmonary Rehab  Date  09/20/17  Referring Provider  Marygrace Drought MD      Visit Diagnosis: COPD, mild ([place])  Patient's Home Medications on Admission:  Current Outpatient Medications:  .  acetaminophen (TYLENOL) 325 MG tablet, Take 650 mg by mouth every 6 (six) hours as needed. , Disp: , Rfl:  .  albuterol (PROVENTIL HFA;VENTOLIN HFA) 108 (90 Base) MCG/ACT inhaler, Inhale 2 puffs into the lungs every 4 (four) hours as needed for shortness of breath., Disp: , Rfl:  .  budesonide (PULMICORT) 0.5 MG/2ML nebulizer solution, Inhale 2 mLs into the lungs daily as needed. , Disp: , Rfl: 1 .  busPIRone (BUSPAR) 10 MG tablet, Take 10 mg by mouth 3 (three) times daily., Disp: , Rfl:  .  clopidogrel (PLAVIX) 75 MG tablet, Take 75 mg by mouth daily., Disp: , Rfl:  .  divalproex (DEPAKOTE) 500 MG DR tablet, Take 500 mg by mouth 2 (two) times daily. , Disp: , Rfl:  .  furosemide (LASIX) 20 MG tablet, Take 60 mg by mouth 2 (two) times daily. , Disp: , Rfl:  .  gabapentin (NEURONTIN) 300 MG capsule, Take 600 mg by mouth 2 (two) times daily. , Disp: , Rfl:  .  ipratropium-albuterol (DUONEB) 0.5-2.5 (3) MG/3ML SOLN, Take 3 mLs by nebulization every 6 (six) hours as needed (wheezing/shortness of breath)., Disp: , Rfl:  .  losartan (COZAAR) 25 MG tablet, Take 1 tablet (25 mg total) by mouth daily., Disp: 30 tablet, Rfl: 11 .  Melatonin 10 MG TABS, Take 5-10 mg by mouth at bedtime. , Disp: , Rfl:  .  metoprolol succinate (TOPROL-XL) 25 MG 24 hr tablet, Take 25 mg by mouth daily., Disp: , Rfl:  .  nitroGLYCERIN (NITROSTAT) 0.4 MG SL tablet, Place 0.4 mg under the tongue every 5  (five) minutes as needed for chest pain., Disp: , Rfl:  .  omeprazole (PRILOSEC) 40 MG capsule, Take 40 mg by mouth daily., Disp: , Rfl:  .  rivaroxaban (XARELTO) 20 MG TABS tablet, Take 20 mg by mouth at bedtime., Disp: , Rfl:  .  rosuvastatin (CRESTOR) 20 MG tablet, Take 20 mg by mouth daily., Disp: , Rfl:  .  senna (SENOKOT) 8.6 MG tablet, Take 2 tablets by mouth 2 (two) times daily., Disp: , Rfl:  .  tamsulosin (FLOMAX) 0.4 MG CAPS capsule, TAKE ONE CAPSULE BY MOUTH EVERY DAY AFTER SUPPER, Disp: 30 capsule, Rfl: 11 .  traZODone (DESYREL) 50 MG tablet, Take 100 mg by mouth at bedtime., Disp: , Rfl:  .  vortioxetine HBr (TRINTELLIX) 5 MG TABS, Take 5 mg by mouth daily. , Disp: , Rfl:   Past Medical History: Past Medical History:  Diagnosis Date  . Acid reflux   . Anxiety   . Arthritis   . Atrial fibrillation ([place])   . CHF (congestive heart failure) ([place])   . Chronic orthostatic hypotension   . Clotting disorder ([place])   . COPD (chronic obstructive pulmonary disease) ([place])   . Depression   . Elevated PSA   . Heart attack ([place])   . Heart disease   . Heart failure ([place])   .  Hepatitis   . High cholesterol   . Hypertension   . Sleep apnea   . TBI (traumatic brain injury) ([place])   . Urinary retention     Tobacco Use: Social History   Tobacco Use  Smoking Status Former Smoker  . Packs/day: 1.00  . Years: 40.00  . Pack years: 40.00  . Types: Cigarettes  . Last attempt to quit: 08/2015  . Years since quitting: 2.2  Smokeless Tobacco Never Used    Labs: Recent Review Flowsheet Data    There is no flowsheet data to display.       Pulmonary Assessment Scores: Pulmonary Assessment Scores    Row Name 09/20/17 1444         ADL UCSD   ADL Phase  Entry     SOB Score total  96     Rest  2     Walk  5     Stairs  5     Bath  3     Dress  3     Shop  5       CAT Score   CAT Score  29       mMRC Score   mMRC Score  2        Pulmonary Function  Assessment: Pulmonary Function Assessment - 09/20/17 1500      Initial Spirometry Results   FVC%  84 %    FEV1%  83 %    FEV1/FVC Ratio  74.28    Comments  good patient effort best of 2      Post Bronchodilator Spirometry Results   FVC%  91.83 %    FEV1%  100.43 %    FEV1/FVC Ratio  82.17    Comments  good patient      Breath   Bilateral Breath Sounds  Clear    Shortness of Breath  Yes;Limiting activity;Fear of Shortness of Breath;Panic with Shortness of Breath       Exercise Target Goals:    Exercise Program Goal: Individual exercise prescription set using results from initial 6 min walk test and THRR while considering  patient's activity barriers and safety.    Exercise Prescription Goal: Initial exercise prescription builds to 30-45 minutes a day of aerobic activity, 2-3 days per week.  Home exercise guidelines will be given to patient during program as part of exercise prescription that the participant will acknowledge.  Activity Barriers & Risk Stratification: Activity Barriers & Cardiac Risk Stratification - 09/20/17 1558      Activity Barriers & Cardiac Risk Stratification   Activity Barriers  Deconditioning;Muscular Weakness;Shortness of Breath;Back Problems;Joint Problems;Decreased Ventricular Function;Balance Concerns;History of Falls l side residuals from accident       6 Minute Walk: 6 Minute Walk    Row Name 09/20/17 1555         6 Minute Walk   Phase  Initial     Distance  1002 feet     Walk Time  6 minutes     # of Rest Breaks  0     MPH  1.9     METS  1.97     RPE  15     Perceived Dyspnea   2     VO2 Peak  6.88     Symptoms  No     Resting HR  70 bpm     Resting BP  128/56     Resting Oxygen Saturation   99 %  Exercise Oxygen Saturation  during 6 min walk  97 %     Max Ex. HR  104 bpm     Max Ex. BP  136/84     2 Minute Post BP  134/74       Interval HR   1 Minute HR  92     2 Minute HR  91     3 Minute HR  93     4 Minute HR  89      5 Minute HR  97     6 Minute HR  104     2 Minute Post HR  72     Interval Heart Rate?  Yes       Interval Oxygen   Interval Oxygen?  Yes     Baseline Oxygen Saturation %  99 %     1 Minute Oxygen Saturation %  97 %     1 Minute Liters of Oxygen  0 L Room Air     2 Minute Oxygen Saturation %  97 %     2 Minute Liters of Oxygen  0 L     3 Minute Oxygen Saturation %  97 %     3 Minute Liters of Oxygen  0 L     4 Minute Oxygen Saturation %  98 %     4 Minute Liters of Oxygen  0 L     5 Minute Oxygen Saturation %  97 %     5 Minute Liters of Oxygen  0 L     6 Minute Oxygen Saturation %  97 %     6 Minute Liters of Oxygen  0 L     2 Minute Post Oxygen Saturation %  99 %     2 Minute Post Liters of Oxygen  0 L       Oxygen Initial Assessment: Oxygen Initial Assessment - 09/20/17 1450      Home Oxygen   Home Oxygen Device  Home Concentrator states he has three machines. He states he has not been using oxygen the last 6-7 weeks    Sleep Oxygen Prescription  None    Home Exercise Oxygen Prescription  None    Home at Rest Exercise Oxygen Prescription  None    Compliance with Home Oxygen Use  Yes      Initial 6 min Walk   Oxygen Used  None      Program Oxygen Prescription   Program Oxygen Prescription  None      Intervention   Short Term Goals  To learn and understand importance of maintaining oxygen saturations>88%;To learn and demonstrate proper use of respiratory medications;To learn and demonstrate proper pursed lip breathing techniques or other breathing techniques.;To learn and understand importance of monitoring SPO2 with pulse oximeter and demonstrate accurate use of the pulse oximeter.    Long  Term Goals  Verbalizes importance of monitoring SPO2 with pulse oximeter and return demonstration;Maintenance of O2 saturations>88%;Exhibits proper breathing techniques, such as pursed lip breathing or other method taught during program session;Compliance with respiratory  medication;Demonstrates proper use of MDI's       Oxygen Re-Evaluation: Oxygen Re-Evaluation    Row Name 09/27/17 1019             Program Oxygen Prescription   Program Oxygen Prescription  None         Home Oxygen   Home Oxygen Device  Home Concentrator       Sleep  Oxygen Prescription  None       Home Exercise Oxygen Prescription  None       Home at Rest Exercise Oxygen Prescription  None       Compliance with Home Oxygen Use  Yes         Goals/Expected Outcomes   Short Term Goals  To learn and understand importance of maintaining oxygen saturations>88%;To learn and demonstrate proper use of respiratory medications;To learn and demonstrate proper pursed lip breathing techniques or other breathing techniques.;To learn and understand importance of monitoring SPO2 with pulse oximeter and demonstrate accurate use of the pulse oximeter.       Long  Term Goals  Verbalizes importance of monitoring SPO2 with pulse oximeter and return demonstration;Maintenance of O2 saturations>88%;Exhibits proper breathing techniques, such as pursed lip breathing or other method taught during program session;Compliance with respiratory medication;Demonstrates proper use of MDI's       Comments  Reviewed PLB technique with pt.  Talked about how it work and it's important to maintaining his exercise saturations.         Goals/Expected Outcomes  Short: Become more profiecient at using PLB.   Long: Become independent at using PLB.          Oxygen Discharge (Final Oxygen Re-Evaluation): Oxygen Re-Evaluation - 09/27/17 1019      Program Oxygen Prescription   Program Oxygen Prescription  None      Home Oxygen   Home Oxygen Device  Home Concentrator    Sleep Oxygen Prescription  None    Home Exercise Oxygen Prescription  None    Home at Rest Exercise Oxygen Prescription  None    Compliance with Home Oxygen Use  Yes      Goals/Expected Outcomes   Short Term Goals  To learn and understand importance of  maintaining oxygen saturations>88%;To learn and demonstrate proper use of respiratory medications;To learn and demonstrate proper pursed lip breathing techniques or other breathing techniques.;To learn and understand importance of monitoring SPO2 with pulse oximeter and demonstrate accurate use of the pulse oximeter.    Long  Term Goals  Verbalizes importance of monitoring SPO2 with pulse oximeter and return demonstration;Maintenance of O2 saturations>88%;Exhibits proper breathing techniques, such as pursed lip breathing or other method taught during program session;Compliance with respiratory medication;Demonstrates proper use of MDI's    Comments  Reviewed PLB technique with pt.  Talked about how it work and it's important to maintaining his exercise saturations.      Goals/Expected Outcomes  Short: Become more profiecient at using PLB.   Long: Become independent at using PLB.       Initial Exercise Prescription: Initial Exercise Prescription - 09/20/17 1500      Date of Initial Exercise RX and Referring Provider   Date  09/20/17    Referring Provider  Marygrace Drought MD      Treadmill   MPH  1.3    Grade  0    Minutes  15    METs  2      Recumbant Bike   Level  1    RPM  50    Watts  10    Minutes  15    METs  2      NuStep   Level  1    SPM  80    Minutes  15    METs  2      Prescription Details   Frequency (times per week)  3    Duration  Progress to 45 minutes of aerobic exercise without signs/symptoms of physical distress      Intensity   THRR 40-80% of Max Heartrate  104-138    Ratings of Perceived Exertion  11-13    Perceived Dyspnea  0-4      Progression   Progression  Continue to progress workloads to maintain intensity without signs/symptoms of physical distress.      Resistance Training   Training Prescription  Yes    Weight  3 lbs    Reps  10-15       Perform Capillary Blood Glucose checks as needed.  Exercise Prescription Changes: Exercise  Prescription Changes    Row Name 09/20/17 1500 09/29/17 1100 10/13/17 1400         Response to Exercise   Blood Pressure (Admit)  128/56  112/66  124/76     Blood Pressure (Exercise)  136/84  174/84  -     Blood Pressure (Exit)  134/74  102/68  110/82     Heart Rate (Admit)  70 bpm  70 bpm  70 bpm     Heart Rate (Exercise)  104 bpm  78 bpm  80 bpm     Heart Rate (Exit)  72 bpm  70 bpm  69 bpm     Oxygen Saturation (Admit)  99 %  99 %  99 %     Oxygen Saturation (Exercise)  97 %  98 %  97 %     Oxygen Saturation (Exit)  99 %  99 %  99 %     Rating of Perceived Exertion (Exercise)  '15  12  13     ' Perceived Dyspnea (Exercise)  '2  3  2     ' Symptoms  none  -  none     Comments  walk test results  -  -     Duration  -  Progress to 45 minutes of aerobic exercise without signs/symptoms of physical distress  Continue with 45 min of aerobic exercise without signs/symptoms of physical distress.     Intensity  -  THRR unchanged  THRR unchanged       Progression   Progression  -  Continue to progress workloads to maintain intensity without signs/symptoms of physical distress.  Continue to progress workloads to maintain intensity without signs/symptoms of physical distress.     Average METs  -  1.7  2.4       Resistance Training   Training Prescription  -  Yes  Yes     Weight  -  3 lb  3 lb     Reps  -  10-15  10-15       Interval Training   Interval Training  -  No  No       Treadmill   MPH  -  -  1.6     Grade  -  -  0     Minutes  -  -  15     METs  -  -  2.23       Recumbant Bike   Level  -  1  1     RPM  -  50  50     Watts  -  12  -     Minutes  -  15  15     METs  -  1.7  2.6       NuStep   Level  -  1  3     SPM  -  80  80     Minutes  -  15  15     METs  -  1.7  2.3       Home Exercise Plan   Plans to continue exercise at  -  [employer] (comment) water aerobics at [employer] (comment) water aerobics at [place] 2 additional days to program exercise sessions.  Add 2 additional days to program exercise sessions.     Initial Home Exercises Provided  -  09/29/17  09/29/17        Exercise Comments: Exercise Comments    Row Name 09/27/17 1018           Exercise Comments  First full day of exercise!  Patient was oriented to gym and equipment including functions, settings, policies, and procedures.  Patient's individual exercise prescription and treatment plan were reviewed.  All starting workloads were established based on the results of the 6 minute walk test done at initial orientation visit.  The plan for exercise progression was also introduced and progression will be customized based on patient's performance and goals.          Exercise Goals and Review: Exercise Goals    Row Name 09/20/17 1602             Exercise Goals   Increase Physical Activity  Yes       Intervention  Provide advice, education, support and counseling about physical activity/exercise needs.;Develop an individualized exercise prescription for aerobic and resistive training based on initial evaluation findings, risk stratification, comorbidities and participant's personal goals.       Expected Outcomes  Short Term: Attend rehab on a regular basis to increase amount of physical activity.;Long Term: Add in home exercise to make exercise part of routine and to increase amount of physical activity.;Long Term: Exercising regularly at least 3-5 days a week.       Increase Strength and Stamina  Yes       Intervention  Provide advice, education, support and counseling about physical activity/exercise needs.;Develop an individualized exercise prescription for aerobic and resistive training based on initial evaluation findings, risk stratification, comorbidities and participant's personal goals.       Expected Outcomes  Short Term: Increase workloads from initial exercise prescription for resistance, speed, and METs.;Short Term:  Perform resistance training exercises routinely during rehab and add in resistance training at home;Long Term: Improve cardiorespiratory fitness, muscular endurance and strength as measured by increased METs and functional capacity (6MWT)       Able to understand and use rate of perceived exertion (RPE) scale  Yes       Intervention  Provide education and explanation on how to use RPE scale       Expected Outcomes  Short Term: Able to use RPE daily in rehab to express subjective intensity level;Long Term:  Able to use RPE to guide intensity level when exercising independently       Able to understand and use Dyspnea scale  Yes       Intervention  Provide education and explanation on how to use Dyspnea scale       Expected Outcomes  Short Term: Able to use Dyspnea scale daily in rehab to express subjective sense of shortness of breath during exertion;Long Term: Able to use Dyspnea scale to guide intensity level when exercising independently  Knowledge and understanding of Target Heart Rate Range (THRR)  Yes       Intervention  Provide education and explanation of THRR including how the numbers were predicted and where they are located for reference       Expected Outcomes  Short Term: Able to state/look up THRR;Long Term: Able to use THRR to govern intensity when exercising independently;Short Term: Able to use daily as guideline for intensity in rehab       Able to check pulse independently  Yes       Intervention  Provide education and demonstration on how to check pulse in carotid and radial arteries.;Review the importance of being able to check your own pulse for safety during independent exercise       Expected Outcomes  Short Term: Able to explain why pulse checking is important during independent exercise;Long Term: Able to check pulse independently and accurately       Understanding of Exercise Prescription  Yes       Intervention  Provide education, explanation, and written materials on  patient's individual exercise prescription       Expected Outcomes  Short Term: Able to explain program exercise prescription;Long Term: Able to explain home exercise prescription to exercise independently          Exercise Goals Re-Evaluation : Exercise Goals Re-Evaluation    Row Name 09/27/17 1018 09/29/17 1140 10/13/17 1405         Exercise Goal Re-Evaluation   Exercise Goals Review  Understanding of Exercise Prescription;Able to understand and use Dyspnea scale;Knowledge and understanding of Target Heart Rate Range (THRR);Able to understand and use rate of perceived exertion (RPE) scale  Increase Physical Activity;Increase Strength and Stamina;Able to understand and use rate of perceived exertion (RPE) scale;Knowledge and understanding of Target Heart Rate Range (THRR);Able to check pulse independently;Understanding of Exercise Prescription  Increase Physical Activity;Able to understand and use rate of perceived exertion (RPE) scale;Increase Strength and Stamina;Able to understand and use Dyspnea scale     Comments  Reviewed RPE scale, THR and program prescription with pt today.  Pt voiced understanding and was given a copy of goals to take home.   Reviewed home exercise with pt including HR, RPE O2.  He plasn to do water aerobics at a gym close to his home.  Jadd has increased levels on all machines.  Staff will monitor progress     Expected Outcomes  Short: Use RPE daily to regulate intensity.  Long: Follow program prescription in THR.  Short - Tierra will join the gym and begin Water exercise [place] will exrecie on his own 3-5 times per week  Short - Demetre will continue to attend regularly Long - [place] will exercise on his own        Discharge Exercise Prescription (Final Exercise Prescription Changes): Exercise Prescription Changes - 10/13/17 1400      Response to Exercise   Blood Pressure (Admit)  124/76    Blood Pressure (Exit)  110/82    Heart Rate (Admit)  70 bpm     Heart Rate (Exercise)  80 bpm    Heart Rate (Exit)  69 bpm    Oxygen Saturation (Admit)  99 %    Oxygen Saturation (Exercise)  97 %    Oxygen Saturation (Exit)  99 %    Rating of Perceived Exertion (Exercise)  13    Perceived Dyspnea (Exercise)  2    Symptoms  none    Duration  Continue  with 45 min of aerobic exercise without signs/symptoms of physical distress.    Intensity  THRR unchanged      Progression   Progression  Continue to progress workloads to maintain intensity without signs/symptoms of physical distress.    Average METs  2.4      Resistance Training   Training Prescription  Yes    Weight  3 lb    Reps  10-15      Interval Training   Interval Training  No      Treadmill   MPH  1.6    Grade  0    Minutes  15    METs  2.23      Recumbant Bike   Level  1    RPM  50    Minutes  15    METs  2.6      NuStep   Level  3    SPM  80    Minutes  15    METs  2.3      Home Exercise Plan   Plans to continue exercise at  [employer] (comment) water aerobics at [place] 2 additional days to program exercise sessions.    Initial Home Exercises Provided  09/29/17       Nutrition:  Target Goals: Understanding of nutrition guidelines, daily intake of sodium <1537m, cholesterol <2048m calories 30% from fat and 7% or less from saturated fats, daily to have 5 or more servings of fruits and vegetables.  Biometrics: Pre Biometrics - 09/20/17 1602      Pre Biometrics   Height  5' 7.4" (1.712 m)    Weight  262 lb 3.2 oz (118.9 kg)    Waist Circumference  45 inches    Hip Circumference  46 inches    Waist to Hip Ratio  0.98 %    BMI (Calculated)  40.58        Nutrition Therapy Plan and Nutrition Goals: Nutrition Therapy & Goals - 09/20/17 1443      Personal Nutrition Goals   Comments  Eat healthier and Lose weight.      Intervention Plan   Intervention  Prescribe, educate and counsel regarding individualized specific dietary  modifications aiming towards targeted core components such as weight, hypertension, lipid management, diabetes, heart failure and other comorbidities.;Nutrition handout(s) given to patient.    Expected Outcomes  Short Term Goal: Understand basic principles of dietary content, such as calories, fat, sodium, cholesterol and nutrients.;Long Term Goal: Adherence to prescribed nutrition plan.       Nutrition Assessments: Nutrition Assessments - 09/20/17 1449      MEDFICTS Scores   Pre Score  60       Nutrition Goals Re-Evaluation:   Nutrition Goals Discharge (Final Nutrition Goals Re-Evaluation):   Psychosocial: Target Goals: Acknowledge presence or absence of significant depression and/or stress, maximize coping skills, provide positive support system. Participant is able to verbalize types and ability to use techniques and skills needed for reducing stress and depression.   Initial Review & Psychosocial Screening: Initial Psych Review & Screening - 09/20/17 1439      Initial Review   Current issues with  History of Depression;Current Anxiety/Panic;Current Sleep Concerns;Current Stress Concerns;Current Depression    Source of Stress Concerns  Chronic Illness;Unable to participate in former interests or hobbies    Comments  He uses medication to help him sleep and sees a psEngineer, water     [facility]  Good Support System?  Yes    Comments  His daughter and ex-wife are a good source for support.       Quality of Life Scores:  Scores of 19 and below usually indicate a poorer quality of life in these areas.  A difference of  2-3 points is a clinically meaningful difference.  A difference of 2-3 points in the total score of the Quality of Life Index has been associated with significant improvement in overall quality of life, self-image, physical symptoms, and general health in studies assessing change in quality of life.  PHQ-9: Recent Review Flowsheet Data    Depression screen  [facility] 2/9 10/06/2017 09/23/2017 09/20/2017 09/20/2017 06/21/2017   Decreased Interest 2 - 3 0 0   Down, Depressed, Hopeless '1 1 2 ' 0 1   PHQ - 2 Score '3 1 5 ' 0 1   Altered sleeping 2 - 2 - -   Tired, decreased energy 2 - 2 - -   Change in appetite 2 - 3 - -   Feeling bad or failure about yourself  2 - 2 - -   Trouble concentrating 1 - 1 - -   Moving slowly or fidgety/restless 1 - 1 - -   Suicidal thoughts 1 0 1  - -   PHQ-9 Score 14 - 17 - -   Difficult doing work/chores - - Very difficult - -     Interpretation of Total Score  Total Score Depression Severity:  1-4 = Minimal depression, 5-9 = Mild depression, 10-14 = Moderate depression, 15-19 = Moderately severe depression, 20-27 = Severe depression   Psychosocial Evaluation and Intervention: Psychosocial Evaluation - 10/06/17 1110      Psychosocial Evaluation & Interventions   Interventions  Encouraged to exercise with the program and follow exercise prescription;Stress management education;Relaxation education    Comments  Mr. Winfree Portales) has returned to this program due to more difficulty breathing and multiple cardiac and pulmonary health problems.  Counselor met with him today for initial psychosocial evaluation.  He is a 67 year old who has a strong support system with his daughter who lives close by and his ex-wife who is in the same community and they have become friends.  Yuepheng reports not sleeping well - approximately 5 hours/night intermittent sleep and states his appetite is "too good."  He has a history of depression and anxiety and has experienced some recent panic symptoms.  He is on medications for sleep and mood and reports an addition to these meds recently which he can already see a positive result in his mood.  He has multiple stressors with his health and some family conflict - particularly with his estranged son.  Krishav has goals to improve his breathing; be healthier overall; and lose some weight.  He has joined a gym to  begin swimming for exercise as well and is looking for a local weight watchers group.  Counselor provided information on a local group for Davaris to consider.  His PHQ-9 initially at orientation on 3/19 was "17" indicating moderately severe symptoms of depression may be present.  Counselor reviewed with [place] and the score has decreased to a "14" in the moderate range with improvement noted and new medications have been introduced for this.  Counselor will follow with Jamare throughout the course of this program.      Expected Outcomes  Viola will exercise for his physical and mental health.  Short - Erikson will look into weight watchers  to help with his appetite and weight loss goals.      Continue Psychosocial Services   Follow up required by counselor       Psychosocial Re-Evaluation:   Psychosocial Discharge (Final Psychosocial Re-Evaluation):   Education: Education Goals: Education classes will be provided on a weekly basis, covering required topics. Participant will state understanding/return demonstration of topics presented.  Learning Barriers/Preferences: Learning Barriers/Preferences - 09/20/17 1447      Learning Barriers/Preferences   Learning Barriers  Hearing    Learning Preferences  Skilled Demonstration       Education Topics:  Initial Evaluation Education: - Verbal, written and demonstration of respiratory meds, oximetry and breathing techniques. Instruction on use of nebulizers and MDIs and importance of monitoring MDI activations.   Pulmonary Rehab from 10/15/2017 in [facility] Cardiac and Pulmonary Rehab  Date  09/20/17  Educator  [facility]  Instruction Review Code  1- Verbalizes Understanding      General Nutrition Guidelines/Fats and Fiber: -Group instruction provided by verbal, written material, models and posters to present the general guidelines for heart healthy nutrition. Gives an explanation and review of dietary fats and fiber.   Pulmonary Rehab from  10/15/2017 in [facility] Cardiac and Pulmonary Rehab  Date  09/27/17  Educator  CR  Instruction Review Code  1- Verbalizes Understanding      Controlling Sodium/Reading Food Labels: -Group verbal and written material supporting the discussion of sodium use in heart healthy nutrition. Review and explanation with models, verbal and written materials for utilization of the food label.   Exercise Physiology & General Exercise Guidelines: - Group verbal and written instruction with models to review the exercise physiology of the cardiovascular system and associated critical values. Provides general exercise guidelines with specific guidelines to those with heart or lung disease.    Cardiac Rehab from 10/27/2016 in [facility] Cardiac and Pulmonary Rehab  Date  09/22/16  Educator  [facility]  Instruction Review Code (retired)  2- meets goals/outcomes      Aerobic Exercise & Resistance Training: - Gives group verbal and written instruction on the various components of exercise. Focuses on aerobic and resistive training programs and the benefits of this training and how to safely progress through these programs.   Flexibility, Balance, Mind/Body Relaxation: Provides group verbal/written instruction on the benefits of flexibility and balance training, including mind/body exercise modes such as yoga, pilates and tai chi.  Demonstration and skill practice provided.   Cardiac Rehab from 10/27/2016 in [facility] Cardiac and Pulmonary Rehab  Date  09/29/16  Educator  [facility]  Instruction Review Code (retired)  2- meets goals/outcomes      Stress and Anxiety: - Provides group verbal and written instruction about the health risks of elevated stress and causes of high stress.  Discuss the correlation between heart/lung disease and anxiety and treatment options. Review healthy ways to manage with stress and anxiety.   Cardiac Rehab from 10/27/2016 in [facility] Cardiac and Pulmonary Rehab  Date  08/13/16  Educator  TS  Instruction Review  Code (retired)  2- meets goals/outcomes      Depression: - Provides group verbal and written instruction on the correlation between heart/lung disease and depressed mood, treatment options, and the stigmas associated with seeking treatment.   Cardiac Rehab from 10/27/2016 in [facility] Cardiac and Pulmonary Rehab  Date  09/10/16  Educator  TS  Instruction Review Code (retired)  2- meets goals/outcomes      Exercise & Equipment Safety: - Individual verbal instruction and demonstration of  equipment use and safety with use of the equipment.   Pulmonary Rehab from 10/15/2017 in [facility] Cardiac and Pulmonary Rehab  Date  09/20/17  Educator  [facility]  Instruction Review Code  1- Verbalizes Understanding      Infection Prevention: - Provides verbal and written material to individual with discussion of infection control including proper hand washing and proper equipment cleaning during exercise session.   Pulmonary Rehab from 10/15/2017 in [facility] Cardiac and Pulmonary Rehab  Date  09/20/17  Educator  [facility]  Instruction Review Code  1- Verbalizes Understanding      Falls Prevention: - Provides verbal and written material to individual with discussion of falls prevention and safety.   Pulmonary Rehab from 10/15/2017 in [facility] Cardiac and Pulmonary Rehab  Date  09/20/17  Educator  [facility]  Instruction Review Code  1- Verbalizes Understanding      Diabetes: - Individual verbal and written instruction to review signs/symptoms of diabetes, desired ranges of glucose level fasting, after meals and with exercise. Advice that pre and post exercise glucose checks will be done for 3 sessions at entry of program.   Chronic Lung Diseases: - Group verbal and written instruction to review updates, respiratory medications, advancements in procedures and treatments. Discuss use of supplemental oxygen including available portable oxygen systems, continuous and intermittent flow rates, concentrators, personal use and safety  guidelines. Review proper use of inhaler and spacers. Provide informative websites for self-education.    Pulmonary Rehab from 10/15/2017 in [facility] Cardiac and Pulmonary Rehab  Date  09/29/17  Educator  [facility]  Instruction Review Code  1- Verbalizes Understanding      Energy Conservation: - Provide group verbal and written instruction for methods to conserve energy, plan and organize activities. Instruct on pacing techniques, use of adaptive equipment and posture/positioning to relieve shortness of breath.   Triggers and Exacerbations: - Group verbal and written instruction to review types of environmental triggers and ways to prevent exacerbations. Discuss weather changes, air quality and the benefits of nasal washing. Review warning signs and symptoms to help prevent infections. Discuss techniques for effective airway clearance, coughing, and vibrations.   AED/CPR: - Group verbal and written instruction with the use of models to demonstrate the basic use of the AED with the basic ABC's of resuscitation.   Anatomy and Physiology of the Lungs: - Group verbal and written instruction with the use of models to provide basic lung anatomy and physiology related to function, structure and complications of lung disease.   Anatomy & Physiology of the Heart: - Group verbal and written instruction and models provide basic cardiac anatomy and physiology, with the coronary electrical and arterial systems. Review of Valvular disease and Heart Failure   Cardiac Rehab from 10/27/2016 in [facility] Cardiac and Pulmonary Rehab  Date  10/08/16  Educator  [facility]  Instruction Review Code (retired)  2- meets goals/outcomes      Cardiac Medications: - Group verbal and written instruction to review commonly prescribed medications for heart disease. Reviews the medication, class of the drug, and side effects.   Cardiac Rehab from 10/27/2016 in [facility] Cardiac and Pulmonary Rehab  Date  10/22/16  Educator  [facility]  Instruction  Review Code (retired)  2- meets Chiropodist 1]      Know Your Numbers and Risk Factors: -Group verbal and written instruction about important numbers in your health.  Discussion of what are risk factors and how they play a role in the disease process.  Review of Cholesterol, Blood  Pressure, Diabetes, and BMI and the role they play in your overall health.   Pulmonary Rehab from 10/15/2017 in [facility] Cardiac and Pulmonary Rehab  Date  10/15/17  Educator  [facility]  Instruction Review Code  1- Verbalizes Understanding      Sleep Hygiene: -Provides group verbal and written instruction about how sleep can affect your health.  Define sleep hygiene, discuss sleep cycles and impact of sleep habits. Review good sleep hygiene tips.    Other: -Provides group and verbal instruction on various topics (see comments)    Knowledge Questionnaire Score: Knowledge Questionnaire Score - 09/20/17 1447      Knowledge Questionnaire Score   Pre Score  17/18 reviewed with patient        Core Components/Risk Factors/Patient Goals at Admission: Personal Goals and Risk Factors at Admission - 09/20/17 1451      Core Components/Risk Factors/Patient Goals on Admission    Weight Management  Yes;Obesity;Weight Loss    Intervention  Weight Management: Develop a combined nutrition and exercise program designed to reach desired caloric intake, while maintaining appropriate intake of nutrient and fiber, sodium and fats, and appropriate energy expenditure required for the weight goal.;Weight Management: Provide education and appropriate resources to help participant work on and attain dietary goals.;Weight Management/Obesity: Establish reasonable short term and long term weight goals.    Admit Weight  262 lb 3.2 oz (118.9 kg)    Goal Weight: Short Term  257 lb (116.6 kg)    Goal Weight: Long Term  180 lb (81.6 kg)    Expected Outcomes  Short Term: Continue to assess and modify interventions until short term weight is  achieved;Long Term: Adherence to nutrition and physical activity/exercise program aimed toward attainment of established weight goal;Weight Maintenance: Understanding of the daily nutrition guidelines, which includes 25-35% calories from fat, 7% or less cal from saturated fats, less than 212m cholesterol, less than 1.5gm of sodium, & 5 or more servings of fruits and vegetables daily;Weight Loss: Understanding of general recommendations for a balanced deficit meal plan, which promotes 1-2 lb weight loss per week and includes a negative energy balance of [phone] kcal/d;Understanding recommendations for meals to include 15-35% energy as protein, 25-35% energy from fat, 35-60% energy from carbohydrates, less than 2018mof dietary cholesterol, 20-35 gm of total fiber daily;Understanding of distribution of calorie intake throughout the day with the consumption of 4-5 meals/snacks    Improve shortness of breath with ADL's  Yes    Intervention  Provide education, individualized exercise plan and daily activity instruction to help decrease symptoms of SOB with activities of daily living.    Expected Outcomes  Short Term: Improve cardiorespiratory fitness to achieve a reduction of symptoms when performing ADLs;Long Term: Be able to perform more ADLs without symptoms or delay the onset of symptoms    Heart Failure  Yes    Intervention  Provide a combined exercise and nutrition program that is supplemented with education, support and counseling about heart failure. Directed toward relieving symptoms such as shortness of breath, decreased exercise tolerance, and extremity edema.    Expected Outcomes  Improve functional capacity of life;Short term: Attendance in program 2-3 days a week with increased exercise capacity. Reported lower sodium intake. Reported increased fruit and vegetable intake. Reports medication compliance.;Short term: Daily weights obtained and reported for increase. Utilizing diuretic protocols set by  physician.;Long term: Adoption of self-care skills and reduction of barriers for early signs and symptoms recognition and intervention leading to self-care maintenance.  Hypertension  Yes    Intervention  Provide education on lifestyle modifcations including regular physical activity/exercise, weight management, moderate sodium restriction and increased consumption of fresh fruit, vegetables, and low fat dairy, alcohol moderation, and smoking cessation.;Monitor prescription use compliance.    Expected Outcomes  Short Term: Continued assessment and intervention until BP is < 140/74m HG in hypertensive participants. < 130/867mHG in hypertensive participants with diabetes, heart failure or chronic kidney disease.;Long Term: Maintenance of blood pressure at goal levels.    Lipids  Yes    Intervention  Provide education and support for participant on nutrition & aerobic/resistive exercise along with prescribed medications to achieve LDL <7035mHDL >50m27m  Expected Outcomes  Short Term: Participant states understanding of desired cholesterol values and is compliant with medications prescribed. Participant is following exercise prescription and nutrition guidelines.;Long Term: Cholesterol controlled with medications as prescribed, with individualized exercise RX and with personalized nutrition plan. Value goals: LDL < 70mg4mL > 40 mg.       Core Components/Risk Factors/Patient Goals Review:  Goals and Risk Factor Review    Row Name 10/21/17 1355             Core Components/Risk Factors/Patient Goals Review   Review  CarmeTharoned to say he is getting a heart cath this coming Wednesday 10/27/2017 since Dr. GollaRockey Situed to rule out more heart problems since CarmeLliamhort of breath.           Core Components/Risk Factors/Patient Goals at Discharge (Final Review):  Goals and Risk Factor Review - 10/21/17 1355      Core Components/Risk Factors/Patient Goals Review   Review  CarmeMartied to  say he is getting a heart cath this coming Wednesday 10/27/2017 since Dr. GollaRockey Situed to rule out more heart problems since CarmeBoluwatifehort of breath.        ITP Comments: ITP Comments    Row Name 09/20/17 1411 10/11/17 0826 10/21/17 1354 10/28/17 1058 11/05/17 1040   ITP Comments  Medical Evaluation completed. Chart sent for review and changes to Dr. Mark Emily Filbertctor of [place] can be found in [facility] encounter 08/27/17   30 day review completed. ITP sent to Dr. Mark Emily Filbertctor of [facility] with ITP unless changes are made by physician  CarmeAsencion Partridgeed to say he is getting a heart cath this coming Wednesday since Dr. GollaRockey Situed to rule out more heart problems since CarmeBenzionhort of breath.   Pt had recent cath and has a referral to Cardiac Rehab.  Staff will follow up with cardiologist   Patient returned today for Exercise. Will continue to monitor and do goals once he has regular attendance.   Row [place] 11/08/17 0822           ITP Comments   30 day review completed. ITP sent to Dr. Mark Emily Filbertctor of [facility] with ITP unless changes are made by physician          Comments: 30 day review

## 2017-11-08 NOTE — Progress Notes (Signed)
Daily Session Note  Patient Details  Name: Kevin Pearson MRN: [id number] Date of Birth: September 25, 1950 Referring Provider:     Pulmonary Rehab from 09/20/2017 in [facility] Cardiac and Pulmonary Rehab  Referring Provider  Marygrace Drought MD      Encounter Date: 11/08/2017  Check In: Session Check In - 11/08/17 1014      Check-In   Location  [facility]-Cardiac & Pulmonary Rehab    Staff Present  Earlean Shawl, BS, ACSM CEP, Exercise Physiologist;Amanda Oletta Darter, BA, ACSM CEP, Exercise Physiologist;Shantae Vantol Flavia Shipper    Supervising physician immediately available to respond to emergencies  [name] immediately available ER MD    Physician(s)  Dr. Kerman Passey and Rifenbark    Medication changes reported      No    Fall or balance concerns reported     No    Tobacco Cessation  No Change    Warm-up and Cool-down  Performed as group-led instruction    Resistance Training Performed  Yes    VAD Patient?  No      Pain Assessment   Currently in Pain?  No/denies          Social History   Tobacco Use  Smoking Status Former Smoker  . Packs/day: 1.00  . Years: 40.00  . Pack years: 40.00  . Types: Cigarettes  . Last attempt to quit: 08/2015  . Years since quitting: 2.2  Smokeless Tobacco Never Used    Goals Met:  Independence with exercise equipment Exercise tolerated well No report of cardiac concerns or symptoms Strength training completed today  Goals Unmet:  Not Applicable  Comments: Pt able to follow exercise prescription today without complaint.  Will continue to monitor for progression.   Dr. Emily Filbert is Medical Director for [place] and [name].

## 2017-11-10 DIAGNOSIS — J449 Chronic obstructive pulmonary disease, unspecified: Secondary | ICD-10-CM | POA: Diagnosis not present

## 2017-11-10 NOTE — Progress Notes (Signed)
Daily Session Note  Patient Details  Name: Kevin Pearson MRN: [id number] Date of Birth: September 21, 1950 Referring Provider:     Pulmonary Rehab from 09/20/2017 in [facility] Cardiac and Pulmonary Rehab  Referring Provider  Kevin Drought MD      Encounter Date: 11/10/2017  Check In: Session Check In - 11/10/17 0949      Check-In   Location  [facility]-Cardiac & Pulmonary Rehab    Staff Present  Kevin Pearson RCP,RRT,BSRT;Kevin Pearson, BA, ACSM CEP, Exercise Physiologist;Kevin Luan Pulling, MA, RCEP, CCRP, Exercise Physiologist    Supervising physician immediately available to respond to emergencies  [name] immediately available ER MD    Physician(s)  Kevin Pearson and Kevin Pearson    Medication changes reported      No    Fall or balance concerns reported     No    Tobacco Cessation  No Change    Warm-up and Cool-down  Performed as group-led instruction    Resistance Training Performed  Yes    VAD Patient?  No      Pain Assessment   Currently in Pain?  No/denies          Social History   Tobacco Use  Smoking Status Former Smoker  . Packs/day: 1.00  . Years: 40.00  . Pack years: 40.00  . Types: Cigarettes  . Last attempt to quit: 08/2015  . Years since quitting: 2.2  Smokeless Tobacco Never Used    Goals Met:  Independence with exercise equipment Exercise tolerated well No report of cardiac concerns or symptoms Strength training completed today  Goals Unmet:  Not Applicable  Comments: Pt able to follow exercise prescription today without complaint.  Will continue to monitor for progression.   Kevin Pearson is Medical Director for [name] and [name].

## 2017-11-12 ENCOUNTER — Encounter: Payer: Medicare Other | Admitting: *Deleted

## 2017-11-12 DIAGNOSIS — I5022 Chronic systolic (congestive) heart failure: Secondary | ICD-10-CM

## 2017-11-12 DIAGNOSIS — J449 Chronic obstructive pulmonary disease, unspecified: Secondary | ICD-10-CM | POA: Diagnosis not present

## 2017-11-12 NOTE — Progress Notes (Signed)
Daily Session Note  Patient Details  Name: Kevin Pearson MRN: [id number] Date of Birth: 07-02-1951 Referring Provider:     Pulmonary Rehab from 09/20/2017 in [facility] Cardiac and Pulmonary Rehab  Referring Provider  Marygrace Drought MD      Encounter Date: 11/12/2017  Check In: Session Check In - 11/12/17 1042      Check-In   Location  [facility]-Cardiac & Pulmonary Rehab    Staff Present  Renita Papa, RN Vickki Hearing, BA, ACSM CEP, Exercise Physiologist Constance Goltz RN    Supervising physician immediately available to respond to emergencies  [name] immediately available ER MD    Physician(s)  Dr. Clearnce Hasten and Alfred Levins    Medication changes reported      No    Fall or balance concerns reported     No    Tobacco Cessation  No Change    Warm-up and Cool-down  Performed as group-led instruction    Resistance Training Performed  Yes    VAD Patient?  No      Pain Assessment   Currently in Pain?  No/denies          Social History   Tobacco Use  Smoking Status Former Smoker  . Packs/day: 1.00  . Years: 40.00  . Pack years: 40.00  . Types: Cigarettes  . Last attempt to quit: 08/2015  . Years since quitting: 2.2  Smokeless Tobacco Never Used    Goals Met:  Proper associated with RPD/PD & O2 Sat Independence with exercise equipment Using PLB without cueing & demonstrates good technique Exercise tolerated well No report of cardiac concerns or symptoms Strength training completed today  Goals Unmet:  Not Applicable  Comments: Pt able to follow exercise prescription today without complaint.  Will continue to monitor for progression.    Dr. Emily Filbert is Medical Director for [place] and [name].

## 2017-11-17 ENCOUNTER — Emergency Department
Admission: EM | Admit: 2017-11-17 | Discharge: 2017-11-17 | Disposition: A | Discharge status: Home / Self Care | Payer: Medicare Other | Attending: Student in an Organized Health Care Education/Training Program | Admitting: Student in an Organized Health Care Education/Training Program

## 2017-11-17 ENCOUNTER — Emergency Department: Payer: Medicare Other

## 2017-11-17 ENCOUNTER — Telehealth: Payer: Self-pay | Admitting: Cardiovascular Disease

## 2017-11-17 ENCOUNTER — Other Ambulatory Visit: Payer: Self-pay

## 2017-11-17 ENCOUNTER — Encounter: Payer: Self-pay | Admitting: Emergency Medicine

## 2017-11-17 DIAGNOSIS — Z87891 Personal history of nicotine dependence: Secondary | ICD-10-CM | POA: Insufficient documentation

## 2017-11-17 DIAGNOSIS — Z79899 Other long term (current) drug therapy: Secondary | ICD-10-CM | POA: Diagnosis not present

## 2017-11-17 DIAGNOSIS — I110 Hypertensive heart disease with heart failure: Secondary | ICD-10-CM | POA: Diagnosis not present

## 2017-11-17 DIAGNOSIS — R079 Chest pain, unspecified: Secondary | ICD-10-CM | POA: Insufficient documentation

## 2017-11-17 DIAGNOSIS — I252 Old myocardial infarction: Secondary | ICD-10-CM | POA: Diagnosis not present

## 2017-11-17 DIAGNOSIS — Z7902 Long term (current) use of antithrombotics/antiplatelets: Secondary | ICD-10-CM | POA: Diagnosis not present

## 2017-11-17 DIAGNOSIS — I5022 Chronic systolic (congestive) heart failure: Secondary | ICD-10-CM | POA: Insufficient documentation

## 2017-11-17 DIAGNOSIS — J449 Chronic obstructive pulmonary disease, unspecified: Secondary | ICD-10-CM | POA: Insufficient documentation

## 2017-11-17 DIAGNOSIS — Z955 Presence of coronary angioplasty implant and graft: Secondary | ICD-10-CM | POA: Insufficient documentation

## 2017-11-17 LAB — CBC
HCT: 37.6 % — ABNORMAL LOW (ref 40.0–52.0)
Hemoglobin: 13 g/dL (ref 13.0–18.0)
MCH: 30.6 pg (ref 26.0–34.0)
MCHC: 34.5 g/dL (ref 32.0–36.0)
MCV: 88.5 fL (ref 80.0–100.0)
Platelets: 225 10*3/uL (ref 150–440)
RBC: 4.24 MIL/uL — ABNORMAL LOW (ref 4.40–5.90)
RDW: 15.6 % — ABNORMAL HIGH (ref 11.5–14.5)
WBC: 9 10*3/uL (ref 3.8–10.6)

## 2017-11-17 LAB — BASIC METABOLIC PANEL
Anion gap: 12 (ref 5–15)
BUN: 18 mg/dL (ref 6–20)
CO2: 21 mmol/L — ABNORMAL LOW (ref 22–32)
Calcium: 9.5 mg/dL (ref 8.9–10.3)
Chloride: 105 mmol/L (ref 101–111)
Creatinine, Ser: 1.29 mg/dL — ABNORMAL HIGH (ref 0.61–1.24)
GFR calc Af Amer: >60 mL/min (ref >60)
GFR calc non Af Amer: 57 mL/min — ABNORMAL LOW (ref >60)
Glucose, Bld: 160 mg/dL — ABNORMAL HIGH (ref 65–99)
Potassium: 4 mmol/L (ref 3.5–5.1)
Sodium: 138 mmol/L (ref 135–145)

## 2017-11-17 LAB — TROPONIN I
Troponin I: <0.03 ng/mL (ref <0.03)
Troponin I: <0.03 ng/mL (ref <0.03)
Troponin I: <0.03 ng/mL (ref <0.03)

## 2017-11-17 MED ORDER — SODIUM CHLORIDE 0.9 % IV BOLUS
500.0000 mL | Freq: Once | INTRAVENOUS | Status: AC
  Administered 2017-11-17: 500 mL via INTRAVENOUS

## 2017-11-17 MED ORDER — ACETAMINOPHEN 500 MG PO TABS
1000.0000 mg | ORAL_TABLET | Freq: Once | ORAL | Status: AC
  Administered 2017-11-17: 1000 mg via ORAL

## 2017-11-17 MED ORDER — T.E.D. BELOW KNEE/X-LARGE MISC: 1.0000 "application " | Freq: Every day | 0 refills | Status: DC

## 2017-11-17 MED ORDER — IPRATROPIUM-ALBUTEROL 0.5-2.5 (3) MG/3ML IN SOLN
3.0000 mL | Freq: Once | RESPIRATORY_TRACT | Status: AC
  Administered 2017-11-17: 3 mL via RESPIRATORY_TRACT
  Filled 2017-11-17: qty 3

## 2017-11-17 MED ORDER — ALBUTEROL SULFATE HFA 108 (90 BASE) MCG/ACT IN AERS: 2.0000 | INHALATION_SPRAY | Freq: Four times a day (QID) | RESPIRATORY_TRACT | 2 refills | Status: DC | PRN

## 2017-11-17 MED ORDER — ISOSORBIDE MONONITRATE ER 60 MG PO TB24
30.0000 mg | ORAL_TABLET | Freq: Every day | ORAL | Status: DC
  Administered 2017-11-17: 30 mg via ORAL
  Filled 2017-11-17: qty 1

## 2017-11-17 MED ORDER — ACETAMINOPHEN 500 MG PO TABS
ORAL_TABLET | ORAL | Status: AC
  Administered 2017-11-17: 1000 mg via ORAL
  Filled 2017-11-17: qty 2

## 2017-11-17 MED ORDER — ISOSORBIDE MONONITRATE ER 30 MG PO TB24: 30.0000 mg | ORAL_TABLET | Freq: Every day | ORAL | 0 refills | Status: DC

## 2017-11-17 MED ORDER — TRAMADOL HCL 50 MG PO TABS
50.0000 mg | ORAL_TABLET | Freq: Once | ORAL | Status: DC
  Filled 2017-11-17: qty 1

## 2017-11-17 NOTE — Telephone Encounter (Signed)
Spoke with Corrie Dandy at [employer] [facility] and reports that patient had some profuse sweating along with shortness of breath. She reports that these symptoms are similar to previous heart attack. She states that he is refusing to go to ED for evaluation and advised that we do not have a provider here in the office and the only option would be for him to be seen in the ED or possibly a urgent care. She discussed this with the patient and he reports that he will go to his primary care provider to see what his thoughts are. She states that he did do a nebulizer treatment there which was expired and he is now reporting that he is feeling some better but that he will go by his primary care providers office for evaluation. Corrie Dandy was appreciative for the call back with no further questions at this time.

## 2017-11-17 NOTE — ED Provider Notes (Signed)
[facility] Emergency Department Provider Note    First MD Initiated Contact with Patient 11/17/17 1701     (approximate)  I have reviewed the triage vital signs and the nursing notes.   HISTORY  Chief Complaint Chest Pain and Shortness of Breath    HPI Kevin Pearson is a 67 y.o. male extensive past cardiac history as well as history of emphysema COPD not on home oxygen presents to the ER with chief complaint of shortness of breath left-sided chest discomfort while the patient was on an exercise bike today during pulmonary rehab.  States that symptoms have been somewhat similar during previous heart attacks but he typically has pain in his left jaw which was absent today.  Currently he is pain-free but does have mild discomfort in the left chest wall.  Did just recently have cardiac cath with Dr. Mariah Milling and was recommended for further medical optimization.  Also complaining of diarrheal illness over the past weekend.  No blood in his stools.   Cath 10/27/2017  Diffuse heavily calcified vessels, patent stents Mild in-stent restenosis RCA, LAD Medical management recommended aggressive cholesterol management, loss   Mid Cx lesion is 100% stenosed.  Prox LAD lesion is 30% stenosed.  Prox RCA lesion is 10% stenosed.  Mid RCA lesion is 40% stenosed.  Mid RCA to Dist RCA lesion is 40% stenosed.  The left ventricular ejection fraction is 35-45% by visual estimate.  There is mild to moderate left ventricular systolic dysfunction.  There is trivial (1+) mitral regurgitation.  Prox Cx to Mid Cx lesion is 40% stenosed.     Past Medical History:  Diagnosis Date  . Acid reflux   . Anxiety   . Arthritis   . Atrial fibrillation (HCC)   . CHF (congestive heart failure) (HCC)   . Chronic orthostatic hypotension   . Clotting disorder (HCC)   . COPD (chronic obstructive pulmonary disease) (HCC)   . Depression   . Elevated PSA   . Heart attack (HCC)     . Heart disease   . Heart failure (HCC)   . Hepatitis   . High cholesterol   . Hypertension   . Sleep apnea   . TBI (traumatic brain injury) (HCC)   . Urinary retention    Family History  Problem Relation Age of Onset  . Other Father        Cerebral hemorrhage  . Kidney cancer Neg Hx   . Kidney disease Neg Hx   . Prostate cancer Neg Hx    Past Surgical History:  Procedure Laterality Date  . BLADDER SURGERY    . cardiac stents    . CYSTOSCOPY WITH URETHRAL DILATATION N/A 06/25/2016   Procedure: CYSTOSCOPY WITH URETHRAL DILATATION;  Surgeon: Vanna Scotland, MD;  Location: [facility] ORS;  Service: Urology;  Laterality: N/A;  . EP IMPLANTABLE DEVICE     [employer] Jude BiV-ICD  . GREEN LIGHT LASER TURP (TRANSURETHRAL RESECTION OF PROSTATE  2016   done in [place]   . LEFT HEART CATH AND CORONARY ANGIOGRAPHY Left 10/27/2017   Procedure: LEFT HEART CATH AND CORONARY ANGIOGRAPHY;  Surgeon: Antonieta Iba, MD;  Location: [facility] INVASIVE CV LAB;  Service: Cardiovascular;  Laterality: Left;   Patient Active Problem List   Diagnosis Date Noted  . Positive cardiac stress test 10/21/2017  . Shortness of breath 10/21/2017  . Unstable angina (HCC) 10/21/2017  . Hyperlipidemia 10/04/2017  . Ischemic cardiomyopathy 10/04/2017  . Atherosclerosis of native coronary artery with  stable angina pectoris (HCC) 10/04/2017  . Acute on chronic renal failure (HCC) 10/04/2017  . Lower extremity edema 10/04/2017  . Acute on chronic heart failure (HCC) 09/21/2017  . Lymphedema 04/14/2017  . Cellulitis 03/25/2017  . COPD exacerbation (HCC) 03/06/2017  . Hypotension 10/29/2016  . Obstructive sleep apnea 10/29/2016  . Chronic bilateral low back pain 08/27/2016  . Chronic obstructive pulmonary disease (HCC) 07/16/2016  . Chronic systolic congestive heart failure (HCC) 07/16/2016  . Peripheral neuropathy 07/16/2016  . Anxiety and depression 07/16/2016  . Arthralgia of right foot 07/16/2016  . Urethral stricture  07/16/2016  . Urinary retention 06/24/2016      Prior to Admission medications   Medication Sig Start Date End Date Taking? Authorizing Provider  acetaminophen (TYLENOL) 325 MG tablet Take 650 mg by mouth every 6 (six) hours as needed.     [provider]  albuterol (PROVENTIL HFA;VENTOLIN HFA) 108 (90 Base) MCG/ACT inhaler Inhale 2 puffs into the lungs every 4 (four) hours as needed for shortness of breath.    [provider]  budesonide (PULMICORT) 0.5 MG/2ML nebulizer solution Inhale 2 mLs into the lungs daily as needed.  09/26/16   [provider]  busPIRone (BUSPAR) 10 MG tablet Take 10 mg by mouth 3 (three) times daily.    [provider]  clopidogrel (PLAVIX) 75 MG tablet Take 75 mg by mouth daily.    [provider]  divalproex (DEPAKOTE) 500 MG DR tablet Take 500 mg by mouth 2 (two) times daily.  07/30/17   [provider]  furosemide (LASIX) 20 MG tablet Take 60 mg by mouth 2 (two) times daily.     [provider]  gabapentin (NEURONTIN) 300 MG capsule Take 600 mg by mouth 2 (two) times daily.     [provider]  ipratropium-albuterol (DUONEB) 0.5-2.5 (3) MG/3ML SOLN Take 3 mLs by nebulization every 6 (six) hours as needed (wheezing/shortness of breath).    [provider]  isosorbide mononitrate (IMDUR) 30 MG 24 hr tablet Take 1 tablet (30 mg total) by mouth daily. 11/17/17   Willy Eddy, MD  losartan (COZAAR) 25 MG tablet Take 1 tablet (25 mg total) by mouth daily. 11/04/17   Antonieta Iba, MD  Melatonin 10 MG TABS Take 5-10 mg by mouth at bedtime.     [provider]  metoprolol succinate (TOPROL-XL) 25 MG 24 hr tablet Take 25 mg by mouth daily.    [provider]  nitroGLYCERIN (NITROSTAT) 0.4 MG SL tablet Place 0.4 mg under the tongue every 5 (five) minutes as needed for chest pain.    [provider]  omeprazole (PRILOSEC) 40 MG capsule Take 40 mg by mouth daily.     [provider]  rivaroxaban (XARELTO) 20 MG TABS tablet Take 20 mg by mouth at bedtime.    [provider]  rosuvastatin (CRESTOR) 20 MG tablet Take 20 mg by mouth daily.    [provider]  senna (SENOKOT) 8.6 MG tablet Take 2 tablets by mouth 2 (two) times daily.    [provider]  tamsulosin (FLOMAX) 0.4 MG CAPS capsule TAKE ONE CAPSULE BY MOUTH EVERY DAY AFTER SUPPER 03/23/17   McGowan, Carollee Herter A, PA-C  traZODone (DESYREL) 50 MG tablet Take 100 mg by mouth at bedtime.    [provider]  vortioxetine HBr (TRINTELLIX) 5 MG TABS Take 5 mg by mouth daily.     [provider]    Allergies Mangifera indica; Other;  and Codeine    Social History Social History   Tobacco Use  . Smoking status: Former Smoker    Packs/day: 1.00    Years: 40.00    Pack years: 40.00    Types: Cigarettes    Last attempt to quit: 08/2015    Years since quitting: 2.2  . Smokeless tobacco: Never Used  Substance Use Topics  . Alcohol use: Not Currently  . Drug use: No    Review of Systems Patient denies headaches, rhinorrhea, blurry vision, numbness, shortness of breath, chest pain, edema, cough, abdominal pain, nausea, vomiting, diarrhea, dysuria, fevers, rashes or hallucinations unless otherwise stated above in HPI. ____________________________________________   PHYSICAL EXAM:  VITAL SIGNS: Vitals:   11/17/17 1930 11/17/17 2000  BP: (!) 154/93 (!) 176/87  Pulse:    Resp:    Temp:    SpO2:      Constitutional: Alert and oriented. Well appearing and in no acute distress. Eyes: Conjunctivae are normal.  Head: Atraumatic. Nose: No congestion/rhinnorhea. Mouth/Throat: Mucous membranes are moist.   Neck: No stridor. Painless ROM.  Cardiovascular: Normal rate, regular rhythm. Grossly normal heart sounds.  Good peripheral circulation. Respiratory: Normal respiratory effort.  No retractions. Lungs CTAB. Gastrointestinal: Soft and nontender. No  distention. No abdominal bruits. No CVA tenderness. Genitourinary: deferred Musculoskeletal: No lower extremity tenderness 1+ BLE edema.  No joint effusions. Neurologic:  Normal speech and language. No gross focal neurologic deficits are appreciated. No facial droop Skin:  Skin is warm, dry and intact. No rash noted. Psychiatric: Mood and affect are normal. Speech and behavior are normal.  ____________________________________________   LABS (all labs ordered are listed, but only abnormal results are displayed)  Results for orders placed or performed during the hospital encounter of 11/17/17 (from the past 24 hour(s))  Basic metabolic panel     Status: Abnormal   Collection Time: 11/17/17 12:57 PM  Result Value Ref Range   Sodium 138 135 - 145 mmol/L   Potassium 4.0 3.5 - 5.1 mmol/L   Chloride 105 101 - 111 mmol/L   CO2 21 (L) 22 - 32 mmol/L   Glucose, Bld 160 (H) 65 - 99 mg/dL   BUN 18 6 - 20 mg/dL   Creatinine, Ser 0.98 (H) 0.61 - 1.24 mg/dL   Calcium 9.5 8.9 - 11.9 mg/dL   GFR calc non Af Amer 57 (L) >60 mL/min   GFR calc Af Amer >60 >60 mL/min   Anion gap 12 5 - 15  CBC     Status: Abnormal   Collection Time: 11/17/17 12:57 PM  Result Value Ref Range   WBC 9.0 3.8 - 10.6 K/uL   RBC 4.24 (L) 4.40 - 5.90 MIL/uL   Hemoglobin 13.0 13.0 - 18.0 g/dL   HCT 14.7 (L) 82.9 - 56.2 %   MCV 88.5 80.0 - 100.0 fL   MCH 30.6 26.0 - 34.0 pg   MCHC 34.5 32.0 - 36.0 g/dL   RDW 13.0 (H) 86.5 - 78.4 %   Platelets 225 150 - 440 K/uL  Troponin I     Status: None   Collection Time: 11/17/17 12:57 PM  Result Value Ref Range   Troponin I <0.03 <0.03 ng/mL  Troponin I     Status: None   Collection Time: 11/17/17  4:34 PM  Result Value Ref Range   Troponin I <0.03 <0.03 ng/mL  Troponin I     Status: None   Collection Time: 11/17/17  8:03 PM  Result Value Ref Range  Troponin I <0.03 <0.03 ng/mL   ____________________________________________  EKG My review and personal interpretation at  Time: 12:53   Indication: chest pain  Rate: 70  Rhythm: v-paced Axis: left Other: paced rhythm, no significant changes as compared to previous,  Slightly decreased voltage ____________________________________________  RADIOLOGY  I personally reviewed all radiographic images ordered to evaluate for the above acute complaints and reviewed radiology reports and findings.  These findings were personally discussed with the patient.  Please see medical record for radiology report.  ____________________________________________   PROCEDURES  Procedure(s) performed:  Procedures    Critical Care performed: no ____________________________________________   INITIAL IMPRESSION / ASSESSMENT AND PLAN / ED COURSE  Pertinent labs & imaging results that were available during my care of the patient were reviewed by me and considered in my medical decision making (see chart for details).  DDX: ACS, pericarditis, esophagitis, boerhaaves, pe, dissection, pna, bronchitis, costochondritis   Kevin Pearson is a 67 y.o. who presents to the ED with symptoms as described above.  Some of the diaphoresis is certainly concerning the patient also with deconditioning and was on exercise bike during episode.  States is not consistent with previous episodes of heart attack.  States that symptoms are improving after rest.  Patient just had recent cardiac cath which showed multivessel disease but no stents and recommendation for medical management.  Patient denies any abdominal pain.  Does have a history of COPD and is also having shortness of breath with this.  Symptoms have been ongoing since Saturday making this less likely ACS.  Patient is on anticoagulation therefore highly unlikely to be pulmonary embolism.  Clinical Course as of Nov 17 2101  Wed Nov 17, 2017  1920 Pressure significantly improved after small bolus of IV fluids.  No hypoxia patient did feel improvement after nebulizer treatment therefore will  repeat.  No orthostasis.   [PR]  2000 Discussed case with Dr. Antoine Poche of cardiology who is also reviewed the patient's recent cath report and EKG.  As the patient's symptoms are atypical from his previous and is currently pain-free.  Will check 1 more enzyme and in the absence of any objective signs of ischemia will start on imdur and have follow-up in clinic   [PR]  2016 Discussed results with patient any agrees with plan.  States that pain is minimal to absent at this time.   [PR]  2054 Third troponin is negative.  Patient pain-free.  Spine do believe he stable and appropriate for follow-up as an outpatient particular given his negative cath.   [PR]    Clinical Course User Index [PR] Willy Eddy, MD     As part of my medical decision making, I reviewed the following data within the electronic MEDICAL RECORD NUMBER Nursing notes reviewed and incorporated, Labs reviewed, notes from prior ED visits.  ____________________________________________   FINAL CLINICAL IMPRESSION(S) / ED DIAGNOSES  Final diagnoses:  Chest pain, unspecified type      NEW MEDICATIONS STARTED DURING THIS VISIT:  New Prescriptions   ISOSORBIDE MONONITRATE (IMDUR) 30 MG 24 HR TABLET    Take 1 tablet (30 mg total) by mouth daily.     Note:  This document was prepared using Dragon voice recognition software and may include unintentional dictation errors.    Willy Eddy, MD 11/17/17 2103

## 2017-11-17 NOTE — Telephone Encounter (Signed)
Nurse with [facility] called, states pt was on treadmill, and started feeling the way he did before he had his last heart attack, states vitals are good, but feels tired and "just not right".

## 2017-11-17 NOTE — Progress Notes (Signed)
Daily Session Note  Patient Details  Name: Kevin Pearson MRN: [id number] Date of Birth: [birth year] Referring Provider:     Pulmonary Rehab from 09/20/2017 in [facility]  Referring Provider  Marygrace Drought MD      Encounter Date: 11/17/2017  Check In: Session Check In - 11/17/17 1018      Check-In   Location  [facility]-Cardiac [facility] Pulmonary Re[facility]    Staff Present  Nyoka Cowden, RN, BSN, Willette Pa, MA, RCEP, CCRP, Exercise Physiologist;Amanda Oletta Darter, [place], ACSM CEP, Exercise Physiologist    Supervising physician immediately available to respond to emergencies  [name] immediately available ER MD    Physician(s)  Jimmye Norman and Quentin Cornwall    Medication changes reported      No    Fall or balance concerns reported     No    Warm-up and Cool-down  Performed on first and last piece of equipment    Resistance Training Performed  Yes    VAD Patient?  No      Pain Assessment   Currently in Pain?  No/denies    Multiple Pain Sites  No          Social History   Tobacco Use  Smoking Status Former Smoker  . Packs/day: 1.00  . Years: 40.00  . Pack years: 40.00  . Types: Cigarettes  . Last attempt to quit: 08/2015  . Years since quitting: 2.2  Smokeless Tobacco Never Used    Goals Met:  Proper associated with RPD/PD & O2 Sat [place] with exercise equipment Using PLB without cueing & demonstrates good technique Strength training completed today  Goals Unmet:  Not Applicable  Comments: Pt reported "just having trouble breathing". O2 sats in high 90's, no difficulty talking with exercise. Stopped exercise after completing 2nd machine and sat down. When questioned he stated "I just don't feel good". While I was on the TM, I began sweating profusely so I stopped. It was better on the NuStep. I started taking my nebulizer over the weekend." A check of current vial in use revelaled expiration of 11/2016. Pt informed to discontinue use of this vial.  Exercise pO2 98, hr 88 RPE 11, RPD 2. Pt states he had cardiac cath 2 weeks ago. Pt refused to go to ER "my dog is home alone". Call to Dr Alphonsus Sias cardiology-nurse stated that there were no physicians in the office today. Pt said his PCP, Dr Guadelupe Sabin, works next door to him and is in the office today. With VS 152/80, pO2 99, HR 88 and "feeling much better", pt left to drive straight to Dr Rexene Alberts for f/up. Dr Rexene Alberts office notified, with VS and s/s update. Pt given number and instructed to call gym/office after seeing MD.   Dr. Emily Filbert is Medical Director for [name].

## 2017-11-17 NOTE — ED Notes (Signed)
Fluid slowed after pt reports having increased SOB. No evidence of wheezing or increased WOB. Pt able to talk in complete sentences. Pt in NAD.

## 2017-11-17 NOTE — ED Triage Notes (Signed)
Pt reports pain in left arm and shortness of breath since Saturday. Pt reports a huge cardiac history.

## 2017-11-18 NOTE — Telephone Encounter (Signed)
Spoke with patient and reviewed visit to ED yesterday and that I also discussed with Dr. Mariah Milling. Reviewed his diagnosis of COPD as well given his continued shortness of breath. Advised that Dr. Mariah Milling would like to see him around the first of June and that I would have scheduling reach out to assist with getting that appointment. He verbalized understanding of our conversation, agreement with plan, and had no further questions at this time.

## 2017-11-18 NOTE — Telephone Encounter (Signed)
Pt states he went to the ED yesterday, and they advised him to see Dr. Mariah Milling today. STates he is still having SOB, states he is still having pain in his sides, back, and arms. Please call.

## 2017-11-18 NOTE — Telephone Encounter (Signed)
Pt is scheduled 12/21/17   Nothing else needed.

## 2017-11-22 ENCOUNTER — Telehealth: Payer: Self-pay | Admitting: *Deleted

## 2017-11-22 ENCOUNTER — Encounter: Payer: Self-pay | Admitting: *Deleted

## 2017-11-22 ENCOUNTER — Ambulatory Visit: Payer: Medicare Other | Admitting: Cardiovascular Disease

## 2017-11-22 DIAGNOSIS — J449 Chronic obstructive pulmonary disease, unspecified: Secondary | ICD-10-CM

## 2017-11-22 NOTE — Progress Notes (Signed)
Discharge Progress Report  Patient Details  Name: Kevin Pearson MRN: [id number] Date of Birth: 1950/10/24 Referring Provider:     Pulmonary Rehab from 09/20/2017 in [facility]  Referring Provider  Sharilyn Sites MD       Number of Visits: 14/36  Reason for Discharge:  Early Exit:  Personal and Readmissions and Physician Advice  Smoking History:  Social History   Tobacco Use  Smoking Status Former Smoker  . Packs/day: 1.00  . Years: 40.00  . Pack years: 40.00  . Types: Cigarettes  . Last attempt to quit: 08/2015  . Years since quitting: 2.2  Smokeless Tobacco Never Used    Diagnosis:  COPD, mild (HCC)  ADL UCSD: Pulmonary Assessment Scores    Row Name 09/20/17 1444         ADL UCSD   ADL Phase  Entry     SOB Score total  96     Rest  2     Walk  5     Stairs  5     Bath  3     Dress  3     Shop  5       CAT Score   CAT Score  29       mMRC Score   mMRC Score  2        Initial Exercise Prescription: Initial Exercise Prescription - 09/20/17 1500      Date of Initial Exercise RX and Referring Provider   Date  09/20/17    Referring Provider  Sharilyn Sites MD      Treadmill   MPH  1.3    Grade  0    Minutes  15    METs  2      Recumbant Bike   Level  1    RPM  50    Watts  10    Minutes  15    METs  2      NuStep   Level  1    SPM  80    Minutes  15    METs  2      Prescription Details   Frequency (times per week)  3    Duration  Progress to 45 minutes of aerobic exercise without signs/symptoms of physical distress      Intensity   THRR 40-80% of Max Heartrate  104-138    Ratings of Perceived Exertion  11-13    Perceived Dyspnea  0-4      Progression   Progression  Continue to progress workloads to maintain intensity without signs/symptoms of physical distress.      Resistance Training   Training Prescription  Yes    Weight  3 lbs    Reps  10-15       Discharge Exercise Prescription (Final  Exercise Prescription Changes): Exercise Prescription Changes - 11/10/17 1200      Response to Exercise   Blood Pressure (Admit)  124/70    Blood Pressure (Exit)  126/64    Heart Rate (Admit)  70 bpm    Heart Rate (Exercise)  80 bpm    Heart Rate (Exit)  69 bpm    Oxygen Saturation (Admit)  99 %    Oxygen Saturation (Exercise)  96 %    Oxygen Saturation (Exit)  99 %    Rating of Perceived Exertion (Exercise)  11    Perceived Dyspnea (Exercise)  1    Symptoms  none  Duration  Continue with 45 min of aerobic exercise without signs/symptoms of physical distress.    Intensity  THRR unchanged      Progression   Progression  Continue to progress workloads to maintain intensity without signs/symptoms of physical distress.    Average METs  2.6      Resistance Training   Training Prescription  Yes    Weight  5 lb    Reps  10-15      Interval Training   Interval Training  No      Treadmill   MPH  2    Grade  0    Minutes  15    METs  2.53      Recumbant Bike   Level  4    RPM  42    Minutes  15    METs  2.7      Home Exercise Plan   Plans to continue exercise at  [employer] (comment) water aerobics at [employer]  Add 2 additional days to program exercise sessions.    Initial Home Exercises Provided  09/29/17       Functional Capacity: 6 Minute Walk    Row Name 09/20/17 1555         6 Minute Walk   Phase  Initial     Distance  1002 feet     Walk Time  6 minutes     # of Rest Breaks  0     MPH  1.9     METS  1.97     RPE  15     Perceived Dyspnea   2     VO2 Peak  6.88     Symptoms  No     Resting HR  70 bpm     Resting BP  128/56     Resting Oxygen Saturation   99 %     Exercise Oxygen Saturation  during 6 min walk  97 %     Max Ex. HR  104 bpm     Max Ex. BP  136/84     2 Minute Post BP  134/74       Interval HR   1 Minute HR  92     2 Minute HR  91     3 Minute HR  93     4 Minute HR  89     5 Minute HR  97     6 Minute  HR  104     2 Minute Post HR  72     Interval Heart Rate?  Yes       Interval Oxygen   Interval Oxygen?  Yes     Baseline Oxygen Saturation %  99 %     1 Minute Oxygen Saturation %  97 %     1 Minute Liters of Oxygen  0 L Room Air     2 Minute Oxygen Saturation %  97 %     2 Minute Liters of Oxygen  0 L     3 Minute Oxygen Saturation %  97 %     3 Minute Liters of Oxygen  0 L     4 Minute Oxygen Saturation %  98 %     4 Minute Liters of Oxygen  0 L     5 Minute Oxygen Saturation %  97 %     5 Minute Liters of Oxygen  0 L  6 Minute Oxygen Saturation %  97 %     6 Minute Liters of Oxygen  0 L     2 Minute Post Oxygen Saturation %  99 %     2 Minute Post Liters of Oxygen  0 L        Psychological, QOL, Others - Outcomes: PHQ 2/9: Depression screen [facility] 2/9 10/06/2017 09/23/2017 09/20/2017 09/20/2017 06/21/2017  Decreased Interest 2 - 3 0 0  Down, Depressed, Hopeless 0 1  PHQ - 2 Score 0 1  Altered sleeping 2 - 2 - -  Tired, decreased energy 2 - 2 - -  Change in appetite 2 - 3 - -  Feeling bad or failure about yourself  2 - 2 - -  Trouble concentrating 1 - 1 - -  Moving slowly or fidgety/restless 1 - 1 - -  Suicidal thoughts 1 0 1 - -  PHQ-9 Score 14 - 17 - -  Difficult doing work/chores - - Very difficult - -    Quality of Life:   Personal Goals: Goals established at orientation with interventions provided to work toward goal. Personal Goals and Risk Factors at Admission - 09/20/17 1451      Core Components/Risk Factors/Patient Goals on Admission    Weight Management  Yes;Obesity;Weight Loss    Intervention  Weight Management: Develop a combined nutrition and exercise program designed to reach desired caloric intake, while maintaining appropriate intake of nutrient and fiber, sodium and fats, and appropriate energy expenditure required for the weight goal.;Weight Management: Provide education and appropriate resources to help participant work on and attain  dietary goals.;Weight Management/Obesity: Establish reasonable short term and long term weight goals.    Admit Weight  262 lb 3.2 oz (118.9 kg)    Goal Weight: Short Term  257 lb (116.6 kg)    Goal Weight: Long Term  180 lb (81.6 kg)    Expected Outcomes  Short Term: Continue to assess and modify interventions until short term weight is achieved;Long Term: Adherence to nutrition and physical activity/exercise program aimed toward attainment of established weight goal;Weight Maintenance: Understanding of the daily nutrition guidelines, which includes 25-35% calories from fat, 7% or less cal from saturated fats, less than  cholesterol, less than 1.5gm of sodium, & 5 or more servings of fruits and vegetables daily;Weight Loss: Understanding of general recommendations for a balanced deficit meal plan, which promotes 1-2 lb weight loss per week and includes a negative energy balance of [phone] kcal/d;Understanding recommendations for meals to include 15-35% energy as protein, 25-35% energy from fat, 35-60% energy from carbohydrates, less than  of dietary cholesterol, 20-35 gm of total fiber daily;Understanding of distribution of calorie intake throughout the day with the consumption of 4-5 meals/snacks    Improve shortness of breath with ADL's  Yes    Intervention  Provide education, individualized exercise plan and daily activity instruction to help decrease symptoms of SOB with activities of daily living.    Expected Outcomes  Short Term: Improve cardiorespiratory fitness to achieve a reduction of symptoms when performing ADLs;Long Term: Be able to perform more ADLs without symptoms or delay the onset of symptoms    Heart Failure  Yes    Intervention  Provide a combined exercise and nutrition program that is supplemented with education, support and counseling about heart failure. Directed toward relieving symptoms such as shortness of breath, decreased exercise tolerance, and extremity edema.     Expected Outcomes  Improve  functional capacity of life;Short term: Attendance in program 2-3 days a week with increased exercise capacity. Reported lower sodium intake. Reported increased fruit and vegetable intake. Reports medication compliance.;Short term: Daily weights obtained and reported for increase. Utilizing diuretic protocols set by physician.;Long term: Adoption of self-care skills and reduction of barriers for early signs and symptoms recognition and intervention leading to self-care maintenance.    Hypertension  Yes    Intervention  Provide education on lifestyle modifcations including regular physical activity/exercise, weight management, moderate sodium restriction and increased consumption of fresh fruit, vegetables, and low fat dairy, alcohol moderation, and smoking cessation.;Monitor prescription use compliance.    Expected Outcomes  Short Term: Continued assessment and intervention until BP is < 140/23mm HG in hypertensive participants. < 130/38mm HG in hypertensive participants with diabetes, heart failure or chronic kidney disease.;Long Term: Maintenance of blood pressure at goal levels.    Lipids  Yes    Intervention  Provide education and support for participant on nutrition & aerobic/resistive exercise along with prescribed medications to achieve LDL 70mg , HDL >40mg .    Expected Outcomes  Short Term: Participant states understanding of desired cholesterol values and is compliant with medications prescribed. Participant is following exercise prescription and nutrition guidelines.;Long Term: Cholesterol controlled with medications as prescribed, with individualized exercise RX and with personalized nutrition plan. Value goals: LDL < , HDL > 40 mg.        Personal Goals Discharge: Goals and Risk Factor Review    Row Name 10/21/17 1355             Core Components/Risk Factors/Patient Goals Review   Review  Natanael called to say he is getting a heart cath this coming  Wednesday 10/27/2017 since Dr. Mariah Milling wanted to rule out more heart problems since Hovanes is short of breath.           Exercise Goals and Review: Exercise Goals    Row Name 09/20/17 1602             Exercise Goals   Increase Physical Activity  Yes       Intervention  Provide advice, education, support and counseling about physical activity/exercise needs.;Develop an individualized exercise prescription for aerobic and resistive training based on initial evaluation findings, risk stratification, comorbidities and participant's personal goals.       Expected Outcomes  Short Term: Attend rehab on a regular basis to increase amount of physical activity.;Long Term: Add in home exercise to make exercise part of routine and to increase amount of physical activity.;Long Term: Exercising regularly at least 3-5 days a week.       Increase Strength and Stamina  Yes       Intervention  Provide advice, education, support and counseling about physical activity/exercise needs.;Develop an individualized exercise prescription for aerobic and resistive training based on initial evaluation findings, risk stratification, comorbidities and participant's personal goals.       Expected Outcomes  Short Term: Increase workloads from initial exercise prescription for resistance, speed, and METs.;Short Term: Perform resistance training exercises routinely during rehab and add in resistance training at home;Long Term: Improve cardiorespiratory fitness, muscular endurance and strength as measured by increased METs and functional capacity ( )       Able to understand and use rate of perceived exertion (RPE) scale  Yes       Intervention  Provide education and explanation on how to use RPE scale       Expected Outcomes  Short Term: Able to use  RPE daily in rehab to express subjective intensity level;Long Term:  Able to use RPE to guide intensity level when exercising independently       Able to understand and use Dyspnea  scale  Yes       Intervention  Provide education and explanation on how to use Dyspnea scale       Expected Outcomes  Short Term: Able to use Dyspnea scale daily in rehab to express subjective sense of shortness of breath during exertion;Long Term: Able to use Dyspnea scale to guide intensity level when exercising independently       Knowledge and understanding of Target Heart Rate Range (THRR)  Yes       Intervention  Provide education and explanation of THRR including how the numbers were predicted and where they are located for reference       Expected Outcomes  Short Term: Able to state/look up THRR;Long Term: Able to use THRR to govern intensity when exercising independently;Short Term: Able to use daily as guideline for intensity in rehab       Able to check pulse independently  Yes       Intervention  Provide education and demonstration on how to check pulse in carotid and radial arteries.;Review the importance of being able to check your own pulse for safety during independent exercise       Expected Outcomes  Short Term: Able to explain why pulse checking is important during independent exercise;Long Term: Able to check pulse independently and accurately       Understanding of Exercise Prescription  Yes       Intervention  Provide education, explanation, and written materials on patient's individual exercise prescription       Expected Outcomes  Short Term: Able to explain program exercise prescription;Long Term: Able to explain home exercise prescription to exercise independently          Nutrition & Weight - Outcomes: Pre Biometrics - 09/20/17 1602      Pre Biometrics   Height  5' 7.4" (1.712 m)    Weight  262 lb 3.2 oz (118.9 kg)    Waist Circumference  45 inches    Hip Circumference  46 inches    Waist to Hip Ratio  0.98 %    BMI (Calculated)  40.58        Nutrition: Nutrition Therapy & Goals - 11/10/17 1052      Nutrition Therapy   Diet  TLC, Heart Healthy    Protein  (specify units)  9oz    Fiber  30 grams    Whole Grain Foods  3 servings    Saturated Fats  16 max. grams    Fruits and Vegetables  5 servings/day 8 optimal    Sodium  2000 grams      Personal Nutrition Goals   Nutrition Goal  Look for ways to include additional servings of vegetables in your daily diet. Steaming, sauteeing, grilling, and roasting are all great cooking methods for them.    Personal Goal #2  Pay closer attention to portions at meal times, particularly starchy foods. Utilize the plate method as discussed.    Personal Goal #3  Continue to look for and join local organizations that support your health goals, such as [employer], the gym, the community pool, and your local doctor/ nutritonist team      Intervention Plan   Intervention  Prescribe, educate and counsel regarding individualized specific dietary modifications aiming towards targeted core components such as  weight, hypertension, lipid management, diabetes, heart failure and other comorbidities.    Expected Outcomes  Short Term Goal: Understand basic principles of dietary content, such as calories, fat, sodium, cholesterol and nutrients.;Short Term Goal: A plan has been developed with personal nutrition goals set during dietitian appointment.;Long Term Goal: Adherence to prescribed nutrition plan.       Nutrition Discharge: Nutrition Assessments - 09/20/17 1449      MEDFICTS Scores   Pre Score  60       Education Questionnaire Score: Knowledge Questionnaire Score - 09/20/17 1447      Knowledge Questionnaire Score   Pre Score  17/18 reviewed with patient       Goals reviewed with patient; copy given to patient.

## 2017-11-22 NOTE — Telephone Encounter (Signed)
Kevin Pearson called to let us know that he was back in the emergency room.  He saw his pulmonologist today and that they think it nerve damage that is causing several of his problems.  He will be getting that theory tested.  He also has a pacer check and blood work with cardiology and get sonogram of carotids.  Dr. Mariah Milling wants Kevin Pearson on heart monitor for exercise. We will discharge Kevin Pearson from Pulmonary Rehab and get him back into Cardiac Rehab for him to wear the monitor.  He is going to stop by this week to pick up paperwork and set up his start date for Cardiac.

## 2017-11-22 NOTE — Progress Notes (Signed)
Pulmonary Individual Treatment Plan  Patient Details  Name: Taksh Hjort MRN: [id number] Date of Birth: Nov 08, 1950 Referring Provider:     Pulmonary Rehab from 09/20/2017 in [facility] Cardiac and Pulmonary Rehab  Referring Provider  Marygrace Drought MD      Initial Encounter Date:    Pulmonary Rehab from 09/20/2017 in [facility] Cardiac and Pulmonary Rehab  Date  09/20/17  Referring Provider  Marygrace Drought MD      Visit Diagnosis: COPD, mild ([place])  Patient's Home Medications on Admission:  Current Outpatient Medications:  .  acetaminophen (TYLENOL) 325 MG tablet, Take 650 mg by mouth every 6 (six) hours as needed. , Disp: , Rfl:  .  albuterol (PROVENTIL HFA;VENTOLIN HFA) 108 (90 Base) MCG/ACT inhaler, Inhale 2 puffs into the lungs every 4 (four) hours as needed for shortness of breath., Disp: , Rfl:  .  albuterol (PROVENTIL HFA;VENTOLIN HFA) 108 (90 Base) MCG/ACT inhaler, Inhale 2 puffs into the lungs every 6 (six) hours as needed for wheezing or shortness of breath., Disp: 1 Inhaler, Rfl: 2 .  budesonide (PULMICORT) 0.5 MG/2ML nebulizer solution, Inhale 2 mLs into the lungs daily as needed. , Disp: , Rfl: 1 .  busPIRone (BUSPAR) 10 MG tablet, Take 10 mg by mouth 3 (three) times daily., Disp: , Rfl:  .  clopidogrel (PLAVIX) 75 MG tablet, Take 75 mg by mouth daily., Disp: , Rfl:  .  divalproex (DEPAKOTE) 500 MG DR tablet, Take 500 mg by mouth 2 (two) times daily. , Disp: , Rfl:  .  Elastic Bandages & Supports (T.E.D. BELOW KNEE/X-LARGE) MISC, 1 application by Does not apply route daily., Disp: 6 each, Rfl: 0 .  furosemide (LASIX) 20 MG tablet, Take 60 mg by mouth 2 (two) times daily. , Disp: , Rfl:  .  gabapentin (NEURONTIN) 300 MG capsule, Take 600 mg by mouth 2 (two) times daily. , Disp: , Rfl:  .  ipratropium-albuterol (DUONEB) 0.5-2.5 (3) MG/3ML SOLN, Take 3 mLs by nebulization every 6 (six) hours as needed (wheezing/shortness of breath)., Disp: , Rfl:  .  isosorbide mononitrate (IMDUR) 30  MG 24 hr tablet, Take 1 tablet (30 mg total) by mouth daily., Disp: 30 tablet, Rfl: 0 .  losartan (COZAAR) 25 MG tablet, Take 1 tablet (25 mg total) by mouth daily., Disp: 30 tablet, Rfl: 11 .  Melatonin 10 MG TABS, Take 5-10 mg by mouth at bedtime. , Disp: , Rfl:  .  metoprolol succinate (TOPROL-XL) 25 MG 24 hr tablet, Take 25 mg by mouth daily., Disp: , Rfl:  .  nitroGLYCERIN (NITROSTAT) 0.4 MG SL tablet, Place 0.4 mg under the tongue every 5 (five) minutes as needed for chest pain., Disp: , Rfl:  .  omeprazole (PRILOSEC) 40 MG capsule, Take 40 mg by mouth daily., Disp: , Rfl:  .  rivaroxaban (XARELTO) 20 MG TABS tablet, Take 20 mg by mouth at bedtime., Disp: , Rfl:  .  rosuvastatin (CRESTOR) 20 MG tablet, Take 20 mg by mouth daily., Disp: , Rfl:  .  senna (SENOKOT) 8.6 MG tablet, Take 2 tablets by mouth 2 (two) times daily., Disp: , Rfl:  .  tamsulosin (FLOMAX) 0.4 MG CAPS capsule, TAKE ONE CAPSULE BY MOUTH EVERY DAY AFTER SUPPER, Disp: 30 capsule, Rfl: 11 .  traZODone (DESYREL) 50 MG tablet, Take 100 mg by mouth at bedtime., Disp: , Rfl:  .  vortioxetine HBr (TRINTELLIX) 5 MG TABS, Take 5 mg by mouth daily. , Disp: , Rfl:   Past Medical History:  Past Medical History:  Diagnosis Date  . Acid reflux   . Anxiety   . Arthritis   . Atrial fibrillation ([place])   . CHF (congestive heart failure) ([place])   . Chronic orthostatic hypotension   . Clotting disorder ([place])   . COPD (chronic obstructive pulmonary disease) ([place])   . Depression   . Elevated PSA   . Heart attack ([place])   . Heart disease   . Heart failure ([place])   . Hepatitis   . High cholesterol   . Hypertension   . Sleep apnea   . TBI (traumatic brain injury) ([place])   . Urinary retention     Tobacco Use: Social History   Tobacco Use  Smoking Status Former Smoker  . Packs/day: 1.00  . Years: 40.00  . Pack years: 40.00  . Types: Cigarettes  . Last attempt to quit: 08/2015  . Years since quitting: 2.2  Smokeless Tobacco  Never Used    Labs: Recent Review Flowsheet Data    There is no flowsheet data to display.       Pulmonary Assessment Scores: Pulmonary Assessment Scores    Row Name 09/20/17 1444         ADL UCSD   ADL Phase  Entry     SOB Score total  96     Rest  2     Walk  5     Stairs  5     Bath  3     Dress  3     Shop  5       CAT Score   CAT Score  29       mMRC Score   mMRC Score  2        Pulmonary Function Assessment: Pulmonary Function Assessment - 09/20/17 1500      Initial Spirometry Results   FVC%  84 %    FEV1%  83 %    FEV1/FVC Ratio  74.28    Comments  good patient effort best of 2      Post Bronchodilator Spirometry Results   FVC%  91.83 %    FEV1%  100.43 %    FEV1/FVC Ratio  82.17    Comments  good patient      Breath   Bilateral Breath Sounds  Clear    Shortness of Breath  Yes;Limiting activity;Fear of Shortness of Breath;Panic with Shortness of Breath       Exercise Target Goals:    Exercise Program Goal: Individual exercise prescription set using results from initial 6 min walk test and THRR while considering  patient's activity barriers and safety.    Exercise Prescription Goal: Initial exercise prescription builds to 30-45 minutes a day of aerobic activity, 2-3 days per week.  Home exercise guidelines will be given to patient during program as part of exercise prescription that the participant will acknowledge.  Activity Barriers & Risk Stratification: Activity Barriers & Cardiac Risk Stratification - 09/20/17 1558      Activity Barriers & Cardiac Risk Stratification   Activity Barriers  Deconditioning;Muscular Weakness;Shortness of Breath;Back Problems;Joint Problems;Decreased Ventricular Function;Balance Concerns;History of Falls l side residuals from accident       6 Minute Walk: [place] Name 09/20/17 1555         6 Minute Walk   Phase  Initial     Distance  1002 feet     Walk Time  6 minutes     #  of Rest  Breaks  0     MPH  1.9     METS  1.97     RPE  15     Perceived Dyspnea   2     VO2 Peak  6.88     Symptoms  No     Resting HR  70 bpm     Resting BP  128/56     Resting Oxygen Saturation   99 %     Exercise Oxygen Saturation  during 6 min walk  97 %     Max Ex. HR  104 bpm     Max Ex. BP  136/84     2 Minute Post BP  134/74       Interval HR   1 Minute HR  92     2 Minute HR  91     3 Minute HR  93     4 Minute HR  89     5 Minute HR  97     6 Minute HR  104     2 Minute Post HR  72     Interval Heart Rate?  Yes       Interval Oxygen   Interval Oxygen?  Yes     Baseline Oxygen Saturation %  99 %     1 Minute Oxygen Saturation %  97 %     1 Minute Liters of Oxygen  0 L Room Air     2 Minute Oxygen Saturation %  97 %     2 Minute Liters of Oxygen  0 L     3 Minute Oxygen Saturation %  97 %     3 Minute Liters of Oxygen  0 L     4 Minute Oxygen Saturation %  98 %     4 Minute Liters of Oxygen  0 L     5 Minute Oxygen Saturation %  97 %     5 Minute Liters of Oxygen  0 L     6 Minute Oxygen Saturation %  97 %     6 Minute Liters of Oxygen  0 L     2 Minute Post Oxygen Saturation %  99 %     2 Minute Post Liters of Oxygen  0 L       Oxygen Initial Assessment: Oxygen Initial Assessment - 09/20/17 1450      Home Oxygen   Home Oxygen Device  Home Concentrator states he has three machines. He states he has not been using oxygen the last 6-7 weeks    Sleep Oxygen Prescription  None    Home Exercise Oxygen Prescription  None    Home at Rest Exercise Oxygen Prescription  None    Compliance with Home Oxygen Use  Yes      Initial 6 min Walk   Oxygen Used  None      Program Oxygen Prescription   Program Oxygen Prescription  None      Intervention   Short Term Goals  To learn and understand importance of maintaining oxygen saturations>88%;To learn and demonstrate proper use of respiratory medications;To learn and demonstrate proper pursed lip breathing techniques or  other breathing techniques.;To learn and understand importance of monitoring SPO2 with pulse oximeter and demonstrate accurate use of the pulse oximeter.    Long  Term Goals  Verbalizes importance of monitoring SPO2 with pulse oximeter and return demonstration;Maintenance of O2 saturations>88%;Exhibits proper breathing techniques, such  as pursed lip breathing or other method taught during program session;Compliance with respiratory medication;Demonstrates proper use of MDI's       Oxygen Re-Evaluation: Oxygen Re-Evaluation    Row Name 09/27/17 1019             Program Oxygen Prescription   Program Oxygen Prescription  None         Home Oxygen   Home Oxygen Device  Home Concentrator       Sleep Oxygen Prescription  None       Home Exercise Oxygen Prescription  None       Home at Rest Exercise Oxygen Prescription  None       Compliance with Home Oxygen Use  Yes         Goals/Expected Outcomes   Short Term Goals  To learn and understand importance of maintaining oxygen saturations>88%;To learn and demonstrate proper use of respiratory medications;To learn and demonstrate proper pursed lip breathing techniques or other breathing techniques.;To learn and understand importance of monitoring SPO2 with pulse oximeter and demonstrate accurate use of the pulse oximeter.       Long  Term Goals  Verbalizes importance of monitoring SPO2 with pulse oximeter and return demonstration;Maintenance of O2 saturations>88%;Exhibits proper breathing techniques, such as pursed lip breathing or other method taught during program session;Compliance with respiratory medication;Demonstrates proper use of MDI's       Comments  Reviewed PLB technique with pt.  Talked about how it work and it's important to maintaining his exercise saturations.         Goals/Expected Outcomes  Short: Become more profiecient at using PLB.   Long: Become independent at using PLB.          Oxygen Discharge (Final Oxygen  Re-Evaluation): Oxygen Re-Evaluation - 09/27/17 1019      Program Oxygen Prescription   Program Oxygen Prescription  None      Home Oxygen   Home Oxygen Device  Home Concentrator    Sleep Oxygen Prescription  None    Home Exercise Oxygen Prescription  None    Home at Rest Exercise Oxygen Prescription  None    Compliance with Home Oxygen Use  Yes      Goals/Expected Outcomes   Short Term Goals  To learn and understand importance of maintaining oxygen saturations>88%;To learn and demonstrate proper use of respiratory medications;To learn and demonstrate proper pursed lip breathing techniques or other breathing techniques.;To learn and understand importance of monitoring SPO2 with pulse oximeter and demonstrate accurate use of the pulse oximeter.    Long  Term Goals  Verbalizes importance of monitoring SPO2 with pulse oximeter and return demonstration;Maintenance of O2 saturations>88%;Exhibits proper breathing techniques, such as pursed lip breathing or other method taught during program session;Compliance with respiratory medication;Demonstrates proper use of MDI's    Comments  Reviewed PLB technique with pt.  Talked about how it work and it's important to maintaining his exercise saturations.      Goals/Expected Outcomes  Short: Become more profiecient at using PLB.   Long: Become independent at using PLB.       Initial Exercise Prescription: Initial Exercise Prescription - 09/20/17 1500      Date of Initial Exercise RX and Referring Provider   Date  09/20/17    Referring Provider  Marygrace Drought MD      Treadmill   MPH  1.3    Grade  0    Minutes  15    METs  2  Recumbant Bike   Level  1    RPM  50    Watts  10    Minutes  15    METs  2      NuStep   Level  1    SPM  80    Minutes  15    METs  2      Prescription Details   Frequency (times per week)  3    Duration  Progress to 45 minutes of aerobic exercise without signs/symptoms of physical distress       Intensity   THRR 40-80% of Max Heartrate  104-138    Ratings of Perceived Exertion  11-13    Perceived Dyspnea  0-4      Progression   Progression  Continue to progress workloads to maintain intensity without signs/symptoms of physical distress.      Resistance Training   Training Prescription  Yes    Weight  3 lbs    Reps  10-15       Perform Capillary Blood Glucose checks as needed.  Exercise Prescription Changes: Exercise Prescription Changes    Row Name 09/20/17 1500 09/29/17 1100 10/13/17 1400 11/10/17 1200       Response to Exercise   Blood Pressure (Admit)  128/56  112/66  124/76  124/70    Blood Pressure (Exercise)  136/84  174/84  -  -    Blood Pressure (Exit)  134/74  102/68  110/82  126/64    Heart Rate (Admit)  70 bpm  70 bpm  70 bpm  70 bpm    Heart Rate (Exercise)  104 bpm  78 bpm  80 bpm  80 bpm    Heart Rate (Exit)  72 bpm  70 bpm  69 bpm  69 bpm    Oxygen Saturation (Admit)  99 %  99 %  99 %  99 %    Oxygen Saturation (Exercise)  97 %  98 %  97 %  96 %    Oxygen Saturation (Exit)  99 %  99 %  99 %  99 %    Rating of Perceived Exertion (Exercise)  '15  12  13  11    ' Perceived Dyspnea (Exercise)  '2  3  2  1    ' Symptoms  none  -  none  none    Comments  walk test results  -  -  -    Duration  -  Progress to 45 minutes of aerobic exercise without signs/symptoms of physical distress  Continue with 45 min of aerobic exercise without signs/symptoms of physical distress.  Continue with 45 min of aerobic exercise without signs/symptoms of physical distress.    Intensity  -  THRR unchanged  THRR unchanged  THRR unchanged      Progression   Progression  -  Continue to progress workloads to maintain intensity without signs/symptoms of physical distress.  Continue to progress workloads to maintain intensity without signs/symptoms of physical distress.  Continue to progress workloads to maintain intensity without signs/symptoms of physical distress.    Average METs  -  1.7   2.4  2.6      Resistance Training   Training Prescription  -  Yes  Yes  Yes    Weight  -  3 lb  3 lb  5 lb    Reps  -  10-15  10-15  10-15      Interval Training   Interval Training  -  No  No  No      Treadmill   MPH  -  -  1.6  2    Grade  -  -  0  0    Minutes  -  -  15  15    METs  -  -  2.23  2.53      Recumbant Bike   Level  -  '1  1  4    ' RPM  -  50  50  42    Watts  -  12  -  -    Minutes  -  '15  15  15    ' METs  -  1.7  2.6  2.7      NuStep   Level  -  1  3  -    SPM  -  80  80  -    Minutes  -  15  15  -    METs  -  1.7  2.3  -      Home Exercise Plan   Plans to continue exercise at  -  [employer] [facility] [employer] (comment) water aerobics at [employer] (comment) water aerobics at [employer] (comment) water aerobics at [place] 2 additional days to program exercise sessions.  Add 2 additional days to program exercise sessions.  Add 2 additional days to program exercise sessions.    Initial Home Exercises Provided  -  09/29/17  09/29/17  09/29/17       Exercise Comments: Exercise Comments    Row Name 09/27/17 1018           Exercise Comments  First full day of exercise!  Patient was oriented to gym and equipment including functions, settings, policies, and procedures.  Patient's individual exercise prescription and treatment plan were reviewed.  All starting workloads were established based on the results of the 6 minute walk test done at initial orientation visit.  The plan for exercise progression was also introduced and progression will be customized based on patient's performance and goals.          Exercise Goals and Review: Exercise Goals    Row Name 09/20/17 1602             Exercise Goals   Increase Physical Activity  Yes       Intervention  Provide advice, education, support and counseling about physical activity/exercise needs.;Develop an individualized exercise prescription for  aerobic and resistive training based on initial evaluation findings, risk stratification, comorbidities and participant's personal goals.       Expected Outcomes  Short Term: Attend rehab on a regular basis to increase amount of physical activity.;Long Term: Add in home exercise to make exercise part of routine and to increase amount of physical activity.;Long Term: Exercising regularly at least 3-5 days a week.       Increase Strength and Stamina  Yes       Intervention  Provide advice, education, support and counseling about physical activity/exercise needs.;Develop an individualized exercise prescription for aerobic and resistive training based on initial evaluation findings, risk stratification, comorbidities and participant's personal goals.       Expected Outcomes  Short Term: Increase workloads from initial exercise prescription for resistance, speed, and METs.;Short Term: Perform resistance training exercises routinely during rehab and add in resistance training at home;Long Term: Improve cardiorespiratory fitness, muscular endurance and  strength as measured by increased METs and functional capacity (6MWT)       Able to understand and use rate of perceived exertion (RPE) scale  Yes       Intervention  Provide education and explanation on how to use RPE scale       Expected Outcomes  Short Term: Able to use RPE daily in rehab to express subjective intensity level;Long Term:  Able to use RPE to guide intensity level when exercising independently       Able to understand and use Dyspnea scale  Yes       Intervention  Provide education and explanation on how to use Dyspnea scale       Expected Outcomes  Short Term: Able to use Dyspnea scale daily in rehab to express subjective sense of shortness of breath during exertion;Long Term: Able to use Dyspnea scale to guide intensity level when exercising independently       Knowledge and understanding of Target Heart Rate Range (THRR)  Yes        Intervention  Provide education and explanation of THRR including how the numbers were predicted and where they are located for reference       Expected Outcomes  Short Term: Able to state/look up THRR;Long Term: Able to use THRR to govern intensity when exercising independently;Short Term: Able to use daily as guideline for intensity in rehab       Able to check pulse independently  Yes       Intervention  Provide education and demonstration on how to check pulse in carotid and radial arteries.;Review the importance of being able to check your own pulse for safety during independent exercise       Expected Outcomes  Short Term: Able to explain why pulse checking is important during independent exercise;Long Term: Able to check pulse independently and accurately       Understanding of Exercise Prescription  Yes       Intervention  Provide education, explanation, and written materials on patient's individual exercise prescription       Expected Outcomes  Short Term: Able to explain program exercise prescription;Long Term: Able to explain home exercise prescription to exercise independently          Exercise Goals Re-Evaluation : Exercise Goals Re-Evaluation    Row Name 09/27/17 1018 09/29/17 1140 10/13/17 1405 11/10/17 1214       Exercise Goal Re-Evaluation   Exercise Goals Review  Understanding of Exercise Prescription;Able to understand and use Dyspnea scale;Knowledge and understanding of Target Heart Rate Range (THRR);Able to understand and use rate of perceived exertion (RPE) scale  Increase Physical Activity;Increase Strength and Stamina;Able to understand and use rate of perceived exertion (RPE) scale;Knowledge and understanding of Target Heart Rate Range (THRR);Able to check pulse independently;Understanding of Exercise Prescription  Increase Physical Activity;Able to understand and use rate of perceived exertion (RPE) scale;Increase Strength and Stamina;Able to understand and use Dyspnea  scale  Increase Physical Activity;Able to understand and use rate of perceived exertion (RPE) scale;Knowledge and understanding of Target Heart Rate Range (THRR);Understanding of Exercise Prescription;Increase Strength and Stamina;Able to understand and use Dyspnea scale    Comments  Reviewed RPE scale, THR and program prescription with pt today.  Pt voiced understanding and was given a copy of goals to take home.   Reviewed home exercise with pt including HR, RPE O2.  He plasn to do water aerobics at a gym close to his home.  Tarrance has increased levels  on all machines.  Staff will monitor progress  Oma has increased MET level to 2.6 and increased intensity on TM and RB.  He has increased weight to strength training to 5 lb    Expected Outcomes  Short: Use RPE daily to regulate intensity.  Long: Follow program prescription in THR.  Short - Chasin will join the gym and begin Water exercise [place] will exrecie on his own 3-5 times per week  Short - Jonatha will continue to attend regularly Long - Erique will exercise on his own  Kenmar will attend regularly Long - Ronnel will increase MET level       Discharge Exercise Prescription (Final Exercise Prescription Changes): Exercise Prescription Changes - 11/10/17 1200      Response to Exercise   Blood Pressure (Admit)  124/70    Blood Pressure (Exit)  126/64    Heart Rate (Admit)  70 bpm    Heart Rate (Exercise)  80 bpm    Heart Rate (Exit)  69 bpm    Oxygen Saturation (Admit)  99 %    Oxygen Saturation (Exercise)  96 %    Oxygen Saturation (Exit)  99 %    Rating of Perceived Exertion (Exercise)  11    Perceived Dyspnea (Exercise)  1    Symptoms  none    Duration  Continue with 45 min of aerobic exercise without signs/symptoms of physical distress.    Intensity  THRR unchanged      Progression   Progression  Continue to progress workloads to maintain intensity without signs/symptoms of physical distress.    Average METs  2.6        Resistance Training   Training Prescription  Yes    Weight  5 lb    Reps  10-15      Interval Training   Interval Training  No      Treadmill   MPH  2    Grade  0    Minutes  15    METs  2.53      Recumbant Bike   Level  4    RPM  42    Minutes  15    METs  2.7      Home Exercise Plan   Plans to continue exercise at  [employer] (comment) water aerobics at [place] 2 additional days to program exercise sessions.    Initial Home Exercises Provided  09/29/17       Nutrition:  Target Goals: Understanding of nutrition guidelines, daily intake of sodium <1542m, cholesterol <2073m calories 30% from fat and 7% or less from saturated fats, daily to have 5 or more servings of fruits and vegetables.  Biometrics: Pre Biometrics - 09/20/17 1602      Pre Biometrics   Height  5' 7.4" (1.712 m)    Weight  262 lb 3.2 oz (118.9 kg)    Waist Circumference  45 inches    Hip Circumference  46 inches    Waist to Hip Ratio  0.98 %    BMI (Calculated)  40.58        Nutrition Therapy Plan and Nutrition Goals: Nutrition Therapy & Goals - 11/10/17 1052      Nutrition Therapy   Diet  TLC, Heart Healthy    Protein (specify units)  9oz    Fiber  30 grams    Whole Grain Foods  3 servings    Saturated  Fats  16 max. grams    Fruits and Vegetables  5 servings/day 8 optimal    Sodium  2000 grams      Personal Nutrition Goals   Nutrition Goal  Look for ways to include additional servings of vegetables in your daily diet. Steaming, sauteeing, grilling, and roasting are all great cooking methods for them.    Personal Goal #2  Pay closer attention to portions at meal times, particularly starchy foods. Utilize the plate method as discussed.    Personal Goal #3  Continue to look for and join local organizations that support your health goals, such as [employer], the gym, the community pool, and your local doctor/ nutritonist team      Intervention Plan    Intervention  Prescribe, educate and counsel regarding individualized specific dietary modifications aiming towards targeted core components such as weight, hypertension, lipid management, diabetes, heart failure and other comorbidities.    Expected Outcomes  Short Term Goal: Understand basic principles of dietary content, such as calories, fat, sodium, cholesterol and nutrients.;Short Term Goal: A plan has been developed with personal nutrition goals set during dietitian appointment.;Long Term Goal: Adherence to prescribed nutrition plan.       Nutrition Assessments: Nutrition Assessments - 09/20/17 1449      MEDFICTS Scores   Pre Score  60       Nutrition Goals Re-Evaluation: Nutrition Goals Re-Evaluation    [place] Name 11/10/17 1107             Goals   Nutrition Goal  Look for ways to include additional servings of vegetables in your daily diet. Steaming, sauteeing, grilling, and roasting are all great cooking methods for them.       Comment  He has been trying to include more vegetables in his diet, which has traditionally been starch-heavy.       Expected Outcome  He will include servings of vegetables daily when planning meals         Personal Goal #2 Re-Evaluation   Personal Goal #2  Pay closer attention to portion sizes at meal times, particularly starchy foods. Utilize the plate method as discussed         Personal Goal #3 Re-Evaluation   Personal Goal #3  Continue to look for and join local organizations that support your health goals, such as [employer], the gym, the community pool and your local doctor/ nutritionist team          Nutrition Goals Discharge (Final Nutrition Goals Re-Evaluation): Nutrition Goals Re-Evaluation - 11/10/17 1107      Goals   Nutrition Goal  Look for ways to include additional servings of vegetables in your daily diet. Steaming, sauteeing, grilling, and roasting are all great cooking methods for them.    Comment  He has been trying to include more  vegetables in his diet, which has traditionally been starch-heavy.    Expected Outcome  He will include servings of vegetables daily when planning meals      Personal Goal #2 Re-Evaluation   Personal Goal #2  Pay closer attention to portion sizes at meal times, particularly starchy foods. Utilize the plate method as discussed      Personal Goal #3 Re-Evaluation   Personal Goal #3  Continue to look for and join local organizations that support your health goals, such as [employer], the gym, the community pool and your local doctor/ nutritionist team       Psychosocial: Target Goals: Acknowledge presence or absence of significant  depression and/or stress, maximize coping skills, provide positive support system. Participant is able to verbalize types and ability to use techniques and skills needed for reducing stress and depression.   Initial Review & Psychosocial Screening: Initial Psych Review & Screening - 09/20/17 1439      Initial Review   Current issues with  History of Depression;Current Anxiety/Panic;Current Sleep Concerns;Current Stress Concerns;Current Depression    Source of Stress Concerns  Chronic Illness;Unable to participate in former interests or hobbies    Comments  He uses medication to help him sleep and sees a psychologist.      Lewis?  Yes    Comments  His daughter and ex-wife are a good source for support.       Quality of Life Scores:  Scores of 19 and below usually indicate a poorer quality of life in these areas.  A difference of  2-3 points is a clinically meaningful difference.  A difference of 2-3 points in the total score of the Quality of Life Index has been associated with significant improvement in overall quality of life, self-image, physical symptoms, and general health in studies assessing change in quality of life.  PHQ-9: Recent Review Flowsheet Data    Depression screen [facility] 2/9 10/06/2017 09/23/2017 09/20/2017 09/20/2017  06/21/2017   Decreased Interest 2 - 3 0 0   Down, Depressed, Hopeless '1 1 2 ' 0 1   PHQ - 2 Score '3 1 5 ' 0 1   Altered sleeping 2 - 2 - -   Tired, decreased energy 2 - 2 - -   Change in appetite 2 - 3 - -   Feeling bad or failure about yourself  2 - 2 - -   Trouble concentrating 1 - 1 - -   Moving slowly or fidgety/restless 1 - 1 - -   Suicidal thoughts 1 0 1  - -   PHQ-9 Score 14 - 17 - -   Difficult doing work/chores - - Very difficult - -     Interpretation of Total Score  Total Score Depression Severity:  1-4 = Minimal depression, 5-9 = Mild depression, 10-14 = Moderate depression, 15-19 = Moderately severe depression, 20-27 = Severe depression   Psychosocial Evaluation and Intervention: Psychosocial Evaluation - 10/06/17 1110      Psychosocial Evaluation & Interventions   Interventions  Encouraged to exercise with the program and follow exercise prescription;Stress management education;Relaxation education    Comments  Mr. Dains Laday) has returned to this program due to more difficulty breathing and multiple cardiac and pulmonary health problems.  Counselor met with him today for initial psychosocial evaluation.  He is a 67 year old who has a strong support system with his daughter who lives close by and his ex-wife who is in the same community and they have become friends.  Joncarlo reports not sleeping well - approximately 5 hours/night intermittent sleep and states his appetite is "too good."  He has a history of depression and anxiety and has experienced some recent panic symptoms.  He is on medications for sleep and mood and reports an addition to these meds recently which he can already see a positive result in his mood.  He has multiple stressors with his health and some family conflict - particularly with his estranged son.  Fernado has goals to improve his breathing; be healthier overall; and lose some weight.  He has joined a gym to begin swimming for exercise as well and  is  looking for a local weight watchers group.  Counselor provided information on a local group for Bunnie to consider.  His PHQ-9 initially at orientation on 3/19 was "17" indicating moderately severe symptoms of depression may be present.  Counselor reviewed with Amadou and the score has decreased to a "14" in the moderate range with improvement noted and new medications have been introduced for this.  Counselor will follow with Shadrach throughout the course of this program.      Expected Outcomes  [place] will exercise for his physical and mental health.  Short - Boyce will look into weight watchers to help with his appetite and weight loss goals.      Continue Psychosocial Services   Follow up required by counselor       Psychosocial Re-Evaluation: Psychosocial Re-Evaluation    Seligman Name 11/08/17 1109             Psychosocial Re-Evaluation   Current issues with  History of Depression;Current Psychotropic Meds       Comments  Counselor follow up with Asencion Partridge today reporting he has had several cardiac tests over the past month; including a heart cath and is feeling more positive about his condition now.  He is making great progress in this program losing weight; eating more healthy; on fewer medications; and sleeping almost 6-8 hours vs. 4/night previously.  Demerius reports no panic symptoms recently and minimal family conflict in the past month.  He is increasing his levels here as he is feeling increased stamina and strength.  Theresa has not joined [employer] yet, but it is still on his list to consider in the future for support for his weight loss journey.  Counselor commended Education officer, community for all of his progress made since coming into this program.  He admits he is "very proud of (him)self" and the progress he has made in such a short time.  Counselor will continue to provide support and follow up with [facility].       Expected Outcomes  Short:  Cinch will continue to eat a more heart healthy diet  for continued weight loss and meet with the nutritionist monthly that he is seeing.  Long:  Vadhir will continue to exercise consistently to breathe better and manage his stressors more positively.         Interventions  Stress management education       Continue Psychosocial Services   Follow up required by staff          Psychosocial Discharge (Final Psychosocial Re-Evaluation): Psychosocial Re-Evaluation - 11/08/17 1109      Psychosocial Re-Evaluation   Current issues with  History of Depression;Current Psychotropic Meds    Comments  Counselor follow up with Asencion Partridge today reporting he has had several cardiac tests over the past month; including a heart cath and is feeling more positive about his condition now.  He is making great progress in this program losing weight; eating more healthy; on fewer medications; and sleeping almost 6-8 hours vs. 4/night previously.  Ladonte reports no panic symptoms recently and minimal family conflict in the past month.  He is increasing his levels here as he is feeling increased stamina and strength.  Benjy has not joined [employer] yet, but it is still on his list to consider in the future for support for his weight loss journey.  Counselor commended [address] for all of his progress made since coming into this program.  He admits he is "very proud  of (him)self" and the progress he has made in such a short time.  Counselor will continue to provide support and follow up with [facility].    Expected Outcomes  Short:  Cesare will continue to eat a more heart healthy diet for continued weight loss and meet with the nutritionist monthly that he is seeing.  Long:  Umberto will continue to exercise consistently to breathe better and manage his stressors more positively.      Interventions  Stress management education    Continue Psychosocial Services   Follow up required by staff       Education: Education Goals: Education classes will be provided on a weekly basis,  covering required topics. Participant will state understanding/return demonstration of topics presented.  Learning Barriers/Preferences: Learning Barriers/Preferences - 09/20/17 1447      Learning Barriers/Preferences   Learning Barriers  Hearing    Learning Preferences  Skilled Demonstration       Education Topics:  Initial Evaluation Education: - Verbal, written and demonstration of respiratory meds, oximetry and breathing techniques. Instruction on use of nebulizers and MDIs and importance of monitoring MDI activations.   Pulmonary Rehab from 11/17/2017 in [facility] Cardiac and Pulmonary Rehab  Date  09/20/17  Educator  [facility]  Instruction Review Code  1- Verbalizes Understanding      General Nutrition Guidelines/Fats and Fiber: -Group instruction provided by verbal, written material, models and posters to present the general guidelines for heart healthy nutrition. Gives an explanation and review of dietary fats and fiber.   Pulmonary Rehab from 11/17/2017 in [facility] Cardiac and Pulmonary Rehab  Date  09/27/17  Educator  CR  Instruction Review Code  1- Verbalizes Understanding      Controlling Sodium/Reading Food Labels: -Group verbal and written material supporting the discussion of sodium use in heart healthy nutrition. Review and explanation with models, verbal and written materials for utilization of the food label.   Exercise Physiology & General Exercise Guidelines: - Group verbal and written instruction with models to review the exercise physiology of the cardiovascular system and associated critical values. Provides general exercise guidelines with specific guidelines to those with heart or lung disease.    Cardiac Rehab from 10/27/2016 in [facility] Cardiac and Pulmonary Rehab  Date  09/22/16  Educator  [facility]  Instruction Review Code (retired)  2- meets goals/outcomes      Aerobic Exercise & Resistance Training: - Gives group verbal and written instruction on the various components  of exercise. Focuses on aerobic and resistive training programs and the benefits of this training and how to safely progress through these programs.   Flexibility, Balance, Mind/Body Relaxation: Provides group verbal/written instruction on the benefits of flexibility and balance training, including mind/body exercise modes such as yoga, pilates and tai chi.  Demonstration and skill practice provided.   Pulmonary Rehab from 11/17/2017 in [facility] Cardiac and Pulmonary Rehab  Date  11/10/17  Educator  AS  Instruction Review Code  1- Verbalizes Understanding      Stress and Anxiety: - Provides group verbal and written instruction about the health risks of elevated stress and causes of high stress.  Discuss the correlation between heart/lung disease and anxiety and treatment options. Review healthy ways to manage with stress and anxiety.   Cardiac Rehab from 10/27/2016 in [facility] Cardiac and Pulmonary Rehab  Date  08/13/16  Educator  TS  Instruction Review Code (retired)  2- meets goals/outcomes      Depression: - Provides group verbal and written instruction on  the correlation between heart/lung disease and depressed mood, treatment options, and the stigmas associated with seeking treatment.   Pulmonary Rehab from 11/17/2017 in [facility] Cardiac and Pulmonary Rehab  Date  11/17/17  Educator  [facility]  Instruction Review Code  1- Verbalizes Understanding      Exercise & Equipment Safety: - Individual verbal instruction and demonstration of equipment use and safety with use of the equipment.   Pulmonary Rehab from 11/17/2017 in [facility] Cardiac and Pulmonary Rehab  Date  09/20/17  Educator  [facility]  Instruction Review Code  1- Verbalizes Understanding      Infection Prevention: - Provides verbal and written material to individual with discussion of infection control including proper hand washing and proper equipment cleaning during exercise session.   Pulmonary Rehab from 11/17/2017 in [facility] Cardiac and Pulmonary  Rehab  Date  09/20/17  Educator  [facility]  Instruction Review Code  1- Verbalizes Understanding      Falls Prevention: - Provides verbal and written material to individual with discussion of falls prevention and safety.   Pulmonary Rehab from 11/17/2017 in [facility] Cardiac and Pulmonary Rehab  Date  09/20/17  Educator  [facility]  Instruction Review Code  1- Verbalizes Understanding      Diabetes: - Individual verbal and written instruction to review signs/symptoms of diabetes, desired ranges of glucose level fasting, after meals and with exercise. Advice that pre and post exercise glucose checks will be done for 3 sessions at entry of program.   Chronic Lung Diseases: - Group verbal and written instruction to review updates, respiratory medications, advancements in procedures and treatments. Discuss use of supplemental oxygen including available portable oxygen systems, continuous and intermittent flow rates, concentrators, personal use and safety guidelines. Review proper use of inhaler and spacers. Provide informative websites for self-education.    Pulmonary Rehab from 11/17/2017 in [facility] Cardiac and Pulmonary Rehab  Date  09/29/17  Educator  [facility]  Instruction Review Code  1- Verbalizes Understanding      Energy Conservation: - Provide group verbal and written instruction for methods to conserve energy, plan and organize activities. Instruct on pacing techniques, use of adaptive equipment and posture/positioning to relieve shortness of breath.   Triggers and Exacerbations: - Group verbal and written instruction to review types of environmental triggers and ways to prevent exacerbations. Discuss weather changes, air quality and the benefits of nasal washing. Review warning signs and symptoms to help prevent infections. Discuss techniques for effective airway clearance, coughing, and vibrations.   AED/CPR: - Group verbal and written instruction with the use of models to demonstrate the basic use of  the AED with the basic ABC's of resuscitation.   Anatomy and Physiology of the Lungs: - Group verbal and written instruction with the use of models to provide basic lung anatomy and physiology related to function, structure and complications of lung disease.   Anatomy & Physiology of the Heart: - Group verbal and written instruction and models provide basic cardiac anatomy and physiology, with the coronary electrical and arterial systems. Review of Valvular disease and Heart Failure   Cardiac Rehab from 10/27/2016 in [facility] Cardiac and Pulmonary Rehab  Date  10/08/16  Educator  SB  Instruction Review Code (retired)  2- meets goals/outcomes      Cardiac Medications: - Group verbal and written instruction to review commonly prescribed medications for heart disease. Reviews the medication, class of the drug, and side effects.   Pulmonary Rehab from 11/17/2017 in [facility] Cardiac and Pulmonary Rehab  Date  11/12/17  Educator  [place]  Instruction Review Code  1- Verbalizes Understanding      Know Your Numbers and Risk Factors: -Group verbal and written instruction about important numbers in your health.  Discussion of what are risk factors and how they play a role in the disease process.  Review of Cholesterol, Blood Pressure, Diabetes, and BMI and the role they play in your overall health.   Pulmonary Rehab from 11/17/2017 in [facility] Cardiac and Pulmonary Rehab  Date  10/15/17  Educator  [facility]  Instruction Review Code  1- Verbalizes Understanding      Sleep Hygiene: -Provides group verbal and written instruction about how sleep can affect your health.  Define sleep hygiene, discuss sleep cycles and impact of sleep habits. Review good sleep hygiene tips.    Other: -Provides group and verbal instruction on various topics (see comments)    Knowledge Questionnaire Score: Knowledge Questionnaire Score - 09/20/17 1447      Knowledge Questionnaire Score   Pre Score  17/18 reviewed with patient          Core Components/Risk Factors/Patient Goals at Admission: Personal Goals and Risk Factors at Admission - 09/20/17 1451      Core Components/Risk Factors/Patient Goals on Admission    Weight Management  Yes;Obesity;Weight Loss    Intervention  Weight Management: Develop a combined nutrition and exercise program designed to reach desired caloric intake, while maintaining appropriate intake of nutrient and fiber, sodium and fats, and appropriate energy expenditure required for the weight goal.;Weight Management: Provide education and appropriate resources to help participant work on and attain dietary goals.;Weight Management/Obesity: Establish reasonable short term and long term weight goals.    Admit Weight  262 lb 3.2 oz (118.9 kg)    Goal Weight: Short Term  257 lb (116.6 kg)    Goal Weight: Long Term  180 lb (81.6 kg)    Expected Outcomes  Short Term: Continue to assess and modify interventions until short term weight is achieved;Long Term: Adherence to nutrition and physical activity/exercise program aimed toward attainment of established weight goal;Weight Maintenance: Understanding of the daily nutrition guidelines, which includes 25-35% calories from fat, 7% or less cal from saturated fats, less than 289m cholesterol, less than 1.5gm of sodium, & 5 or more servings of fruits and vegetables daily;Weight Loss: Understanding of general recommendations for a balanced deficit meal plan, which promotes 1-2 lb weight loss per week and includes a negative energy balance of [phone] kcal/d;Understanding recommendations for meals to include 15-35% energy as protein, 25-35% energy from fat, 35-60% energy from carbohydrates, less than 2062mof dietary cholesterol, 20-35 gm of total fiber daily;Understanding of distribution of calorie intake throughout the day with the consumption of 4-5 meals/snacks    Improve shortness of breath with ADL's  Yes    Intervention  Provide education, individualized exercise  plan and daily activity instruction to help decrease symptoms of SOB with activities of daily living.    Expected Outcomes  Short Term: Improve cardiorespiratory fitness to achieve a reduction of symptoms when performing ADLs;Long Term: Be able to perform more ADLs without symptoms or delay the onset of symptoms    Heart Failure  Yes    Intervention  Provide a combined exercise and nutrition program that is supplemented with education, support and counseling about heart failure. Directed toward relieving symptoms such as shortness of breath, decreased exercise tolerance, and extremity edema.    Expected Outcomes  Improve functional capacity of life;Short term: Attendance in program  2-3 days a week with increased exercise capacity. Reported lower sodium intake. Reported increased fruit and vegetable intake. Reports medication compliance.;Short term: Daily weights obtained and reported for increase. Utilizing diuretic protocols set by physician.;Long term: Adoption of self-care skills and reduction of barriers for early signs and symptoms recognition and intervention leading to self-care maintenance.    Hypertension  Yes    Intervention  Provide education on lifestyle modifcations including regular physical activity/exercise, weight management, moderate sodium restriction and increased consumption of fresh fruit, vegetables, and low fat dairy, alcohol moderation, and smoking cessation.;Monitor prescription use compliance.    Expected Outcomes  Short Term: Continued assessment and intervention until BP is < 140/88m HG in hypertensive participants. < 130/872mHG in hypertensive participants with diabetes, heart failure or chronic kidney disease.;Long Term: Maintenance of blood pressure at goal levels.    Lipids  Yes    Intervention  Provide education and support for participant on nutrition & aerobic/resistive exercise along with prescribed medications to achieve LDL <7012mHDL >83m53m  Expected Outcomes   Short Term: Participant states understanding of desired cholesterol values and is compliant with medications prescribed. Participant is following exercise prescription and nutrition guidelines.;Long Term: Cholesterol controlled with medications as prescribed, with individualized exercise RX and with personalized nutrition plan. Value goals: LDL < 70mg54mL > 40 mg.       Core Components/Risk Factors/Patient Goals Review:  Goals and Risk Factor Review    Row Name 10/21/17 1355             Core Components/Risk Factors/Patient Goals Review   Review  CarmeKordaeed to say he is getting a heart cath this coming Wednesday 10/27/2017 since Dr. GollaRockey Situed to rule out more heart problems since CarmeLotharhort of breath.           Core Components/Risk Factors/Patient Goals at Discharge (Final Review):  Goals and Risk Factor Review - 10/21/17 1355      Core Components/Risk Factors/Patient Goals Review   Review  CarmeBlantoned to say he is getting a heart cath this coming Wednesday 10/27/2017 since Dr. GollaRockey Situed to rule out more heart problems since CarmeElijahort of breath.        ITP Comments: ITP Comments    Row Name 09/20/17 1411 10/11/17 0826 10/21/17 1354 10/28/17 1058 11/05/17 1040   ITP Comments  Medical Evaluation completed. Chart sent for review and changes to Dr. Mark Emily Filbertctor of LungWPelahatchiegnosis can be found in [facility] encounter 08/27/17   30 day review completed. ITP sent to Dr. Mark Emily Filbertctor of LungWLawtontinue with ITP unless changes are made by physician  CarmeAsencion Partridgeed to say he is getting a heart cath this coming Wednesday since Dr. GollaRockey Situed to rule out more heart problems since CarmeMenahort of breath.   Pt had recent cath and has a referral to Cardiac Rehab.  Staff will follow up with cardiologist   Patient returned today for Exercise. Will continue to monitor and do goals once he has regular attendance.   [place] 11/08/17 0822 11/17/17 1147 11/22/17  1641       ITP Comments   30 day review completed. ITP sent to Dr. Mark Emily Filbertctor of LungWWest Stewartstowntinue with ITP unless changes are made by physician   Pt reported "just having trouble breathing". O2 sats in high 90's, no difficulty talking with exercise. Stopped exercise after completing 2nd machine and sat down. When questioned he stated "I just  don't feel good". While I was on the TM, I began sweating profusely so I stopped. It was better on the NuStep. I started taking my nebulizer over the weekend." A check of current vial in use revelaled expiration of 11/2016. Pt informed to discontinue use of this vial. Exercise pO2 98, hr 88 RPE 11, RPD 2. Pt states he had cardiac cath 2 weeks ago. Pt refused to go to ER "my dog is home alone". Call to Dr Alphonsus Sias cardiology-nurse stated that there were no physicians in the office today. Pt said his PCP, Dr Guadelupe Sabin, works next door to him and is in the office today. With VS 152/80, pO2 99, HR 88 and "feeling much better", pt left to drive straight to Dr Rexene Alberts for f/up. Dr Rexene Alberts office notified, with VS and s/s update. Pt given number and instructed to call gym/office after seeing MD.   Asencion Partridge called to let us know that he was back in the emergency room.  He saw his pulmonologist today and that they think it nerve damage that is causing several of his problems.  He will be getting that theory tested.  He also has a pacer check and blood work with cardiology and get sonogram of carotids.  Dr. Rockey Situ wants [place] on heart monitor for exercise. We will discharge Anuel from Pulmonary Rehab and get him back into Cardiac Rehab for him to wear the monitor.  He is going to stop by this week to pick up paperwork and set up his start date for Cardiac.         Comments: Discharge ITP

## 2017-11-23 ENCOUNTER — Other Ambulatory Visit (INDEPENDENT_AMBULATORY_CARE_PROVIDER_SITE_OTHER): Payer: Medicare Other | Admitting: *Deleted

## 2017-11-23 ENCOUNTER — Other Ambulatory Visit: Payer: Self-pay | Admitting: Specialist

## 2017-11-23 ENCOUNTER — Ambulatory Visit (INDEPENDENT_AMBULATORY_CARE_PROVIDER_SITE_OTHER): Payer: Medicare Other | Admitting: Internal Medicine

## 2017-11-23 ENCOUNTER — Encounter: Payer: Self-pay | Admitting: Internal Medicine

## 2017-11-23 VITALS — BP 140/80 | HR 70 | Ht 67.0 in | Wt 262.0 lb

## 2017-11-23 DIAGNOSIS — I5022 Chronic systolic (congestive) heart failure: Secondary | ICD-10-CM

## 2017-11-23 DIAGNOSIS — I255 Ischemic cardiomyopathy: Secondary | ICD-10-CM

## 2017-11-23 DIAGNOSIS — R0609 Other forms of dyspnea: Secondary | ICD-10-CM

## 2017-11-23 DIAGNOSIS — E782 Mixed hyperlipidemia: Secondary | ICD-10-CM

## 2017-11-23 DIAGNOSIS — I25118 Atherosclerotic heart disease of native coronary artery with other forms of angina pectoris: Secondary | ICD-10-CM

## 2017-11-23 DIAGNOSIS — R0600 Dyspnea, unspecified: Secondary | ICD-10-CM

## 2017-11-23 MED ORDER — SPIRONOLACTONE 25 MG PO TABS: 12.5000 mg | ORAL_TABLET | Freq: Every day | ORAL | 3 refills | Status: DC

## 2017-11-23 NOTE — Patient Instructions (Addendum)
Medication Instructions: - Your physician has recommended you make the following change in your medication:   1) STOP imdur (isosorbide) 2) START aldactone (spironolactone) 25 mg- take 1/2 tablet (12.5 mg) by mouth once daily  Labwork: - Your physician recommends that you return for lab work in: 2 weeks (BMP)  Procedures/Testing: - none ordered  Follow-Up: - Your physician recommends that you follow-up as scheduled with Dr. Mariah Milling: Tuesday 12/21/17 at 9:40 am.  - Remote monitoring is used to monitor your Pacemaker of ICD from home. This monitoring reduces the number of office visits required to check your device to one time per year. It allows [place] to keep an eye on the functioning of your device to ensure it is working properly. You are scheduled for a device check from home on 02/22/18. You may send your transmission at any time that day. If you have a wireless device, the transmission will be sent automatically. After your physician reviews your transmission, you will receive a postcard with your next transmission date.  - Your physician wants you to follow-up in: 1 year with Dr. Graciela Husbands. You will receive a reminder letter in the mail two months in advance. If you don't receive a letter, please call our office to schedule the follow-up appointment.   Any Additional Special Instructions Will Be Listed Below (If Applicable).     If you need a refill on your cardiac medications before your next appointment, please call your pharmacy.

## 2017-11-23 NOTE — Progress Notes (Signed)
ELECTROPHYSIOLOGY CONSULT NOTE  Patient ID: Kevin Pearson, MRN: [id number], DOB/AGE: 1950-11-28 67 y.o. Admit date: (Not on file) Date of Consult: 11/23/2017  Primary Physician: Sharilyn Sites, MD Primary Cardiologist: Maximilian Tallo is a 67 y.o. male who is being seen today for the evaluation of ICD  at the request of TG.    HPI Kevin Pearson is a 67 y.o. male former pt of Dr AP now followed by DR TG  He has a previously implanted ICD upgraded from a pacemaker 2013.    He has a history of coronary artery disease with prior revascularization  Multiple PCIs  He has permanent atrial fibrillation is anticoagulated with Xarelto; no bleeding issues.  He has chronic sob aand worsening peripheral edema.  He urinates briskly with his diuretics; he drinks copiously.  He says he has not tolerated Entresto in the past.  No interval defibrillator discharges.  Had problems with his creatinine bumping on metolazone.  When I mentioned that there was a issue to discuss he said "shoot me or give me a gun to shoot myself".  He has struggled with depression and also has a manic component.   DATE TEST EF   2017 Echo   35 %   4/19/  Echo   45-50 %   4/19 LHC 35-45% LAD 30 Cx T RCA 40   Date Cr K Hgb  3/19 1.73 3.8   5/19 1.29 4.0 13.0      Past Medical History:  Diagnosis Date  . Acid reflux   . Anxiety   . Arthritis   . Atrial fibrillation (HCC)   . CHF (congestive heart failure) (HCC)   . Chronic orthostatic hypotension   . Clotting disorder (HCC)   . COPD (chronic obstructive pulmonary disease) (HCC)   . Depression   . Elevated PSA   . Heart attack (HCC)   . Heart disease   . Heart failure (HCC)   . Hepatitis   . High cholesterol   . Hypertension   . Sleep apnea   . TBI (traumatic brain injury) (HCC)   . Urinary retention       Surgical History:  Past Surgical History:  Procedure Laterality Date  . BLADDER SURGERY    . cardiac stents    . CYSTOSCOPY  WITH URETHRAL DILATATION N/A 06/25/2016   Procedure: CYSTOSCOPY WITH URETHRAL DILATATION;  Surgeon: Vanna Scotland, MD;  Location: [facility] ORS;  Service: Urology;  Laterality: N/A;  . EP IMPLANTABLE DEVICE     St. Jude BiV-ICD  . GREEN LIGHT LASER TURP (TRANSURETHRAL RESECTION OF PROSTATE  2016   done in [place]   . LEFT HEART CATH AND CORONARY ANGIOGRAPHY Left 10/27/2017   Procedure: LEFT HEART CATH AND CORONARY ANGIOGRAPHY;  Surgeon: Antonieta Iba, MD;  Location: [facility] INVASIVE CV [facility];  Service: Cardiovascular;  Laterality: Left;     Home Meds: Prior to Admission medications   Medication Sig Start Date End Date Taking? Authorizing Provider  acetaminophen (TYLENOL) 325 MG tablet Take 650 mg by mouth every 6 (six) hours as needed.    Yes [provider]  albuterol (PROVENTIL HFA;VENTOLIN HFA) 108 (90 Base) MCG/ACT inhaler Inhale 2 puffs into the lungs every 4 (four) hours as needed for shortness of breath.   Yes [provider]  albuterol (PROVENTIL HFA;VENTOLIN HFA) 108 (90 Base) MCG/ACT inhaler Inhale 2 puffs into the lungs every 6 (six) hours as needed for wheezing or shortness of breath.  11/17/17  Yes Willy Eddy, MD  budesonide (PULMICORT) 0.5 MG/2ML nebulizer solution Inhale 2 mLs into the lungs daily as needed.  09/26/16  Yes [provider]  busPIRone (BUSPAR) 10 MG tablet Take 10 mg by mouth 3 (three) times daily.   Yes [provider]  clopidogrel (PLAVIX) 75 MG tablet Take 75 mg by mouth daily.   Yes [provider]  divalproex (DEPAKOTE) 500 MG DR tablet Take 500 mg by mouth 2 (two) times daily.  07/30/17  Yes [provider]  Elastic Bandages & Supports (T.E.D. BELOW KNEE/X-LARGE) MISC 1 application by Does not apply route daily. 11/17/17  Yes Willy Eddy, MD  furosemide (LASIX) 20 MG tablet Take 60 mg by mouth 2 (two) times daily.    Yes [provider]  gabapentin (NEURONTIN) 300 MG capsule Take 600 mg by mouth 2  (two) times daily.    Yes [provider]  ipratropium-albuterol (DUONEB) 0.5-2.5 (3) MG/3ML SOLN Take 3 mLs by nebulization every 6 (six) hours as needed (wheezing/shortness of breath).   Yes [provider]  isosorbide mononitrate (IMDUR) 30 MG 24 hr tablet Take 1 tablet (30 mg total) by mouth daily. 11/17/17  Yes Willy Eddy, MD  losartan (COZAAR) 25 MG tablet Take 1 tablet (25 mg total) by mouth daily. 11/04/17  Yes Gollan, Tollie Pizza, MD  Melatonin 10 MG TABS Take 5-10 mg by mouth at bedtime.    Yes [provider]  metoprolol succinate (TOPROL-XL) 25 MG 24 hr tablet Take 25 mg by mouth daily.   Yes [provider]  nitroGLYCERIN (NITROSTAT) 0.4 MG SL tablet Place 0.4 mg under the tongue every 5 (five) minutes as needed for chest pain.   Yes [provider]  omeprazole (PRILOSEC) 40 MG capsule Take 40 mg by mouth daily.   Yes [provider]  rivaroxaban (XARELTO) 20 MG TABS tablet Take 20 mg by mouth at bedtime.   Yes [provider]  rosuvastatin (CRESTOR) 20 MG tablet Take 20 mg by mouth daily.   Yes [provider]  senna (SENOKOT) 8.6 MG tablet Take 2 tablets by mouth 2 (two) times daily.   Yes [provider]  tamsulosin (FLOMAX) 0.4 MG CAPS capsule TAKE ONE CAPSULE BY MOUTH EVERY DAY AFTER SUPPER 03/23/17  Yes McGowan, Carollee Herter A, PA-C  traZODone (DESYREL) 50 MG tablet Take 100 mg by mouth at bedtime.   Yes [provider]  vortioxetine HBr (TRINTELLIX) 5 MG TABS Take 5 mg by mouth daily.    Yes [provider]    Allergies:  Allergies  Allergen Reactions  . Mangifera Indica Anaphylaxis  . Other Anaphylaxis, Itching and Other (See Comments)    Mango skin  . Codeine Nausea And Vomiting    Social History   Socioeconomic History  . Marital status: Divorced    Spouse name: Not on file  . Number of children: Not on file  . Years of education: Not on file  . Highest education  level: Not on file  Occupational History  . Not on file  Social Needs  . Financial resource strain: Not on file  . Food insecurity:    Worry: Not on file    Inability: Not on file  . Transportation needs:    Medical: Not on file    Non-medical: Not on file  Tobacco Use  . Smoking status: Former Smoker    Packs/day: 1.00    Years: 40.00    Pack years: 40.00  Types: Cigarettes    Last attempt to quit: 08/2015    Years since quitting: 2.2  . Smokeless tobacco: Never Used  Substance and Sexual Activity  . Alcohol use: Not Currently  . Drug use: No  . Sexual activity: Never  Lifestyle  . Physical activity:    Days per week: Not on file    Minutes per session: Not on file  . Stress: Not on file  Relationships  . Social connections:    Talks on phone: Not on file    Gets together: Not on file    Attends religious service: Not on file    Active member of club or organization: Not on file    Attends meetings of clubs or organizations: Not on file    Relationship status: Not on file  . Intimate partner violence:    Fear of current or ex partner: Not on file    Emotionally abused: Not on file    Physically abused: Not on file    Forced sexual activity: Not on file  Other Topics Concern  . Not on file  Social History Narrative  . Not on file     Family History  Problem Relation Age of Onset  . Other Father        Cerebral hemorrhage  . Kidney cancer Neg Hx   . Kidney disease Neg Hx   . Prostate cancer Neg Hx      ROS:  Please see the history of present illness.     All other systems reviewed and negative.    Physical Exam: Blood pressure 140/80, pulse 70, height  (1.702 m), weight 262 lb (118.8 kg), SpO2 99 %. General: Well developed, well nourished male in no acute distress. Head: Normocephalic, atraumatic, sclera non-icteric, no xanthomas, nares are without discharge. EENT: normal  Lymph Nodes:  none Neck: Negative for carotid bruits. JVD  8-10 Back:without scoliosis kyphosis Lungs: Clear bilaterally to auscultation without wheezes, rales, or rhonchi. Breathing is unlabored. Heart: RRR with S1 S2. No  murmur . No rubs, or gallops appreciated. Abdomen: Soft, non-tender, non-distended with normoactive bowel sounds. No hepatomegaly. No rebound/guarding. No obvious abdominal masses. Msk:  Strength and tone appear normal for age. Extremities: No clubbing or cyanosis. 2+ edema.  Distal pedal pulses are 2+ and equal bilaterally. Skin: Warm and Dry Neuro: Alert and oriented X 3. CN III-XII intact Grossly normal sensory and motor function . Psych:  Responds to questions appropriately with a normal affect.      Labs: Cardiac Enzymes No results for input(s): CKTOTAL, CKMB, TROPONINI in the last 72 hours. CBC Lab Results  Component Value Date   WBC 9.0 11/17/2017   HGB 13.0 11/17/2017   HCT 37.6 (L) 11/17/2017   MCV 88.5 11/17/2017   PLT 225 11/17/2017   PROTIME: No results for input(s): LABPROT, INR in the last 72 hours. Chemistry  Recent Labs  Lab 11/17/17 1257  NA 138  K 4.0  CL 105  CO2 21*  BUN 18  CREATININE 1.29*  CALCIUM 9.5  GLUCOSE 160*   Lipids No results found for: CHOL, HDL, LDLCALC, TRIG BNP No results found for: PROBNP Thyroid Function Tests: No results for input(s): TSH, T4TOTAL, T3FREE, THYROIDAB in the last 72 hours.  Invalid input(s): FREET3 Miscellaneous No results found for: DDIMER  Radiology/Studies:  Dg Chest 2 View  Result Date: 11/17/2017 CLINICAL DATA:  Chest pain, upper axillary and LEFT arm pain, labored breathing, lightheadedness today, history CHF, COPD, coronary artery  disease post stenting EXAM: CHEST - 2 VIEW COMPARISON:  03/22/2017 FINDINGS: LEFT subclavian AICD leads project at RIGHT ventricle and coronary sinus. Coronary arterial stents noted. Normal heart size, mediastinal contours, and pulmonary vascularity. Bronchitic changes without infiltrate, pleural effusion or  pneumothorax. No acute osseous findings. IMPRESSION: Bronchitic changes without infiltrate. Electronically Signed   By: Ulyses Southward M.D.   On: 11/17/2017 13:25    EKG: ECG personally reviewed atrial fibrillation with ventricular pacing upright QRS lead V1 negative QRS lead I   Assessment and Plan:  Ischemic cardiomyopathy  Atrial fibrillation-permanent  Implantable defibrillator-CRT-[employer] Jude  Congestive heart failure-acute/chronic systolic/diastolic class III  Renal insufficiency grade 3  Obesity  Depression question manic depression   The patient has persistent left ventricular dysfunction in the context of permanent atrial fibrillation and volume overload.  We have reviewed the physiology of of fluid; his diuresis is sufficiently brisk, that decreasing p.o. intake may suffice to put him into a negative fluid balance.  I have also discussed with Dr. Knute Neu we will begin him on Aldactone and discontinue his isosorbide given his recent catheterization.  Device function is normal  We both agree that repeating an effort to begin Sherryll Burger would be appropriate although perhaps a little bit of a challenge from insurance for you given his ejection fraction  I have encouraged him to follow-up with psychiatry           Sherryl Manges

## 2017-11-24 LAB — HEPATIC FUNCTION PANEL
ALT: 11 IU/L (ref 0–44)
AST: 13 IU/L (ref 0–40)
Albumin: 3.9 g/dL (ref 3.6–4.8)
Alkaline Phosphatase: 44 IU/L (ref 39–117)
Bilirubin Total: 0.5 mg/dL (ref 0.0–1.2)
Bilirubin, Direct: 0.16 mg/dL (ref 0.00–0.40)
Total Protein: 6.6 g/dL (ref 6.0–8.5)

## 2017-11-24 LAB — LIPID PANEL
Chol/HDL Ratio: 3.8 ratio (ref 0.0–5.0)
Cholesterol, Total: 114 mg/dL (ref 100–199)
HDL: 30 mg/dL — ABNORMAL LOW (ref >39)
LDL Calculated: 60 mg/dL (ref 0–99)
Triglycerides: 119 mg/dL (ref 0–149)
VLDL Cholesterol Cal: 24 mg/dL (ref 5–40)

## 2017-11-25 ENCOUNTER — Ambulatory Visit (INDEPENDENT_AMBULATORY_CARE_PROVIDER_SITE_OTHER): Payer: Medicare Other

## 2017-11-25 DIAGNOSIS — R0989 Other specified symptoms and signs involving the circulatory and respiratory systems: Secondary | ICD-10-CM | POA: Diagnosis not present

## 2017-11-30 ENCOUNTER — Telehealth: Payer: Self-pay | Admitting: Cardiovascular Disease

## 2017-11-30 ENCOUNTER — Ambulatory Visit
Admission: RE | Admit: 2017-11-30 | Discharge: 2017-11-30 | Disposition: A | Discharge status: Home / Self Care | Payer: Medicare Other | Source: Ambulatory Visit | Attending: Specialist | Admitting: Specialist

## 2017-11-30 DIAGNOSIS — R0609 Other forms of dyspnea: Secondary | ICD-10-CM | POA: Insufficient documentation

## 2017-11-30 DIAGNOSIS — R0600 Dyspnea, unspecified: Secondary | ICD-10-CM

## 2017-11-30 NOTE — Telephone Encounter (Signed)
Patient verbalized understanding of lab results and carotid doppler results. No follow up recommended on repeat carotid doppler in the future on the report. Routing to Dr Mariah Milling for advice on when doppler needs to be repeated.

## 2017-11-30 NOTE — Telephone Encounter (Signed)
Patient returning call for results.  Please call.   °

## 2017-12-02 ENCOUNTER — Encounter: Payer: Medicare Other | Attending: Family Medicine

## 2017-12-02 VITALS — Ht 67.4 in | Wt 256.8 lb

## 2017-12-02 DIAGNOSIS — I5022 Chronic systolic (congestive) heart failure: Secondary | ICD-10-CM | POA: Diagnosis not present

## 2017-12-02 NOTE — Progress Notes (Signed)
Daily Session Note  Patient Details  Name: Kevin Pearson MRN: [id number] Date of Birth: 1951-05-09 Referring Provider:     Pulmonary Rehab from 09/20/2017 in [facility]  Referring Provider  Marygrace Drought MD      Encounter Date: 12/02/2017  Check In: Session Check In - 12/02/17 0830      Check-In   Location  [facility]-Cardiac & Pulmonary Rehab    Staff Present  Gerlene Burdock, RN, BSN;Jessica Luan Pulling, MA, RCEP, CCRP, Exercise Physiologist;Regie Bunner Oletta Darter, [place], ACSM CEP, Exercise Physiologist    Supervising physician immediately available to respond to emergencies  See telemetry face sheet for immediately available ER MD    Medication changes reported      Yes    Comments  will bring updated list Tuesday    Fall or balance concerns reported     No    Warm-up and Cool-down  Performed on first and last piece of equipment    Resistance Training Performed  Yes    VAD Patient?  No      Pain Assessment   Currently in Pain?  No/denies    Multiple Pain Sites  No          Social History   Tobacco Use  Smoking Status Former Smoker  . Packs/day: 1.00  . Years: 40.00  . Pack years: 40.00  . Types: Cigarettes  . Last attempt to quit: 08/2015  . Years since quitting: 2.3  Smokeless Tobacco Never Used    Goals Met:  Independence with exercise equipment Exercise tolerated well No report of cardiac concerns or symptoms Strength training completed today  Goals Unmet:  Not Applicable  Comments: First full day of exercise!  Patient was oriented to gym and equipment including functions, settings, policies, and procedures.  Patient's individual exercise prescription and treatment plan were reviewed.  All starting workloads were established based on the results of the 6 minute walk test done at initial orientation visit.  The plan for exercise progression was also introduced and progression will be customized based on patient's performance and goals.    Dr. Emily Filbert is Medical Director for [name].

## 2017-12-02 NOTE — Progress Notes (Signed)
Cardiac Individual Treatment Plan  Patient Details  Name: Alfie Rideaux MRN: [id number] Date of Birth: 08/15/1950 Referring Provider:     Cardiac Rehab from 12/02/2017 in [facility] Cardiac and Pulmonary Rehab  Referring Provider  Ida Rogue MD      Initial Encounter Date:    Cardiac Rehab from 12/02/2017 in [facility] Cardiac and Pulmonary Rehab  Date  12/02/17  Referring Provider  Ida Rogue MD      Visit Diagnosis: Heart failure, chronic systolic ([place])  Patient's Home Medications on Admission:  Current Outpatient Medications:  .  acetaminophen (TYLENOL) 325 MG tablet, Take 650 mg by mouth every 6 (six) hours as needed. , Disp: , Rfl:  .  albuterol (PROVENTIL HFA;VENTOLIN HFA) 108 (90 Base) MCG/ACT inhaler, Inhale 2 puffs into the lungs every 4 (four) hours as needed for shortness of breath., Disp: , Rfl:  .  albuterol (PROVENTIL HFA;VENTOLIN HFA) 108 (90 Base) MCG/ACT inhaler, Inhale 2 puffs into the lungs every 6 (six) hours as needed for wheezing or shortness of breath., Disp: 1 Inhaler, Rfl: 2 .  budesonide (PULMICORT) 0.5 MG/2ML nebulizer solution, Inhale 2 mLs into the lungs daily as needed. , Disp: , Rfl: 1 .  busPIRone (BUSPAR) 10 MG tablet, Take 10 mg by mouth 3 (three) times daily., Disp: , Rfl:  .  clopidogrel (PLAVIX) 75 MG tablet, Take 75 mg by mouth daily., Disp: , Rfl:  .  divalproex (DEPAKOTE) 500 MG DR tablet, Take 500 mg by mouth 2 (two) times daily. , Disp: , Rfl:  .  Elastic Bandages & Supports (T.E.D. BELOW KNEE/X-LARGE) MISC, 1 application by Does not apply route daily., Disp: 6 each, Rfl: 0 .  furosemide (LASIX) 20 MG tablet, Take 60 mg by mouth 2 (two) times daily. , Disp: , Rfl:  .  gabapentin (NEURONTIN) 300 MG capsule, Take 600 mg by mouth 2 (two) times daily. , Disp: , Rfl:  .  ipratropium-albuterol (DUONEB) 0.5-2.5 (3) MG/3ML SOLN, Take 3 mLs by nebulization every 6 (six) hours as needed (wheezing/shortness of breath)., Disp: , Rfl:  .  losartan (COZAAR) 25  MG tablet, Take 1 tablet (25 mg total) by mouth daily., Disp: 30 tablet, Rfl: 11 .  Melatonin 10 MG TABS, Take 5-10 mg by mouth at bedtime. , Disp: , Rfl:  .  metoprolol succinate (TOPROL-XL) 25 MG 24 hr tablet, Take 25 mg by mouth daily., Disp: , Rfl:  .  nitroGLYCERIN (NITROSTAT) 0.4 MG SL tablet, Place 0.4 mg under the tongue every 5 (five) minutes as needed for chest pain., Disp: , Rfl:  .  omeprazole (PRILOSEC) 40 MG capsule, Take 40 mg by mouth daily., Disp: , Rfl:  .  rivaroxaban (XARELTO) 20 MG TABS tablet, Take 20 mg by mouth at bedtime., Disp: , Rfl:  .  rosuvastatin (CRESTOR) 20 MG tablet, Take 20 mg by mouth daily., Disp: , Rfl:  .  senna (SENOKOT) 8.6 MG tablet, Take 2 tablets by mouth 2 (two) times daily., Disp: , Rfl:  .  spironolactone (ALDACTONE) 25 MG tablet, Take 0.5 tablets (12.5 mg total) by mouth daily., Disp: 45 tablet, Rfl: 3 .  tamsulosin (FLOMAX) 0.4 MG CAPS capsule, TAKE ONE CAPSULE BY MOUTH EVERY DAY AFTER SUPPER, Disp: 30 capsule, Rfl: 11 .  traZODone (DESYREL) 50 MG tablet, Take 100 mg by mouth at bedtime., Disp: , Rfl:  .  vortioxetine HBr (TRINTELLIX) 5 MG TABS, Take 5 mg by mouth daily. , Disp: , Rfl:   Past Medical History: Past  Medical History:  Diagnosis Date  . Acid reflux   . Anxiety   . Arthritis   . Atrial fibrillation ([place])   . CHF (congestive heart failure) ([place])   . Chronic orthostatic hypotension   . Clotting disorder ([place])   . COPD (chronic obstructive pulmonary disease) ([place])   . Depression   . Elevated PSA   . Heart attack ([place])   . Heart disease   . Heart failure ([place])   . Hepatitis   . High cholesterol   . Hypertension   . Sleep apnea   . TBI (traumatic brain injury) ([place])   . Urinary retention     Tobacco Use: Social History   Tobacco Use  Smoking Status Former Smoker  . Packs/day: 1.00  . Years: 40.00  . Pack years: 40.00  . Types: Cigarettes  . Last attempt to quit: 08/2015  . Years since quitting: 2.3  Smokeless  Tobacco Never Used    Labs: Recent Review Scientist, physiological    Labs for ITP Cardiac and Pulmonary Rehab Latest Ref Rng & Units 11/23/2017   Cholestrol 100 - 199 mg/dL 114   LDLCALC 0 - 99 mg/dL 60   HDL >39 mg/dL 30(L)   Trlycerides 0 - 149 mg/dL 119       Exercise Target Goals: Date: 12/02/17  Exercise Program Goal: Individual exercise prescription set using results from initial 6 min walk test and THRR while considering  patient's activity barriers and safety.   Exercise Prescription Goal: Initial exercise prescription builds to 30-45 minutes a day of aerobic activity, 2-3 days per week.  Home exercise guidelines will be given to patient during program as part of exercise prescription that the participant will acknowledge.  Activity Barriers & Risk Stratification: Activity Barriers & Cardiac Risk Stratification - 12/02/17 1054      Activity Barriers & Cardiac Risk Stratification   Activity Barriers  Deconditioning;Muscular Weakness;Shortness of Breath;Back Problems;Joint Problems;Decreased Ventricular Function;Balance Concerns;History of Falls;Neck/Spine Problems    Cardiac Risk Stratification  High       6 Minute Walk: 6 Minute Walk    Row Name 12/02/17 1052         6 Minute Walk   Phase  Initial Using Pulmonary Admission Test from 09/20/17     Distance  1002 feet     Distance % Change  23.8 %     Walk Time  6 minutes     # of Rest Breaks  0     MPH  1.9     METS  1.97     RPE  15     Perceived Dyspnea   2     VO2 Peak  6.88     Symptoms  No     Resting HR  70 bpm     Resting BP  128/56     Resting Oxygen Saturation   99 %     Exercise Oxygen Saturation  during 6 min walk  97 %     Max Ex. HR  104 bpm     Max Ex. BP  136/84     2 Minute Post BP  134/74       Interval HR   1 Minute HR  92     2 Minute HR  91     3 Minute HR  93     4 Minute HR  89     5 Minute HR  97     6 Minute HR  104     2 Minute Post HR  72     Interval Heart Rate?  Yes        Interval Oxygen   Interval Oxygen?  Yes     Baseline Oxygen Saturation %  99 %     1 Minute Oxygen Saturation %  97 %     1 Minute Liters of Oxygen  0 L Room Air     2 Minute Oxygen Saturation %  97 %     2 Minute Liters of Oxygen  0 L     3 Minute Oxygen Saturation %  97 %     3 Minute Liters of Oxygen  0 L     4 Minute Oxygen Saturation %  98 %     4 Minute Liters of Oxygen  0 L     5 Minute Oxygen Saturation %  97 %     5 Minute Liters of Oxygen  0 L     6 Minute Oxygen Saturation %  97 %     6 Minute Liters of Oxygen  0 L     2 Minute Post Oxygen Saturation %  99 %     2 Minute Post Liters of Oxygen  0 L        Oxygen Initial Assessment:   Oxygen Re-Evaluation:   Oxygen Discharge (Final Oxygen Re-Evaluation):   Initial Exercise Prescription: Initial Exercise Prescription - 12/02/17 1000      Date of Initial Exercise RX and Referring Provider   Date  12/02/17    Referring Provider  Ida Rogue MD      Treadmill   MPH  2    Grade  0    Minutes  15    METs  2.53      NuStep   Level  3    SPM  80    Minutes  15    METs  2.3      Arm Ergometer   Level  1    RPM  50    Minutes  15    METs  2      Prescription Details   Frequency (times per week)  3    Duration  Progress to 45 minutes of aerobic exercise without signs/symptoms of physical distress      Intensity   THRR 40-80% of Max Heartrate  104-138    Ratings of Perceived Exertion  11-13    Perceived Dyspnea  0-4      Progression   Progression  Continue to progress workloads to maintain intensity without signs/symptoms of physical distress.      Resistance Training   Training Prescription  Yes    Weight  4 lbs    Reps  10-15       Perform Capillary Blood Glucose checks as needed.  Exercise Prescription Changes: Exercise Prescription Changes    Row Name 10/13/17 1400 11/10/17 1200           Response to Exercise   Blood Pressure (Admit)  124/76  124/70      Blood Pressure (Exit)   110/82  126/64      Heart Rate (Admit)  70 bpm  70 bpm      Heart Rate (Exercise)  80 bpm  80 bpm      Heart Rate (Exit)  69 bpm  69 bpm      Oxygen Saturation (Admit)  99 %  99 %  Oxygen Saturation (Exercise)  97 %  96 %      Oxygen Saturation (Exit)  99 %  99 %      Rating of Perceived Exertion (Exercise)  13  11      Perceived Dyspnea (Exercise)  2  1      Symptoms  none  none      Duration  Continue with 45 min of aerobic exercise without signs/symptoms of physical distress.  Continue with 45 min of aerobic exercise without signs/symptoms of physical distress.      Intensity  THRR unchanged  THRR unchanged        Progression   Progression  Continue to progress workloads to maintain intensity without signs/symptoms of physical distress.  Continue to progress workloads to maintain intensity without signs/symptoms of physical distress.      Average METs  2.4  2.6        Resistance Training   Training Prescription  Yes  Yes      Weight  3 lb  5 lb      Reps  10-15  10-15        Interval Training   Interval Training  No  No        Treadmill   MPH  1.6  2      Grade  0  0      Minutes  15  15      METs  2.23  2.53        Recumbant Bike   Level  1  4      RPM  50  42      Minutes  15  15      METs  2.6  2.7        NuStep   Level  3  -      SPM  80  -      Minutes  15  -      METs  2.3  -        Home Exercise Plan   Plans to continue exercise at  [employer] (comment) water aerobics at [employer] (comment) water aerobics at [place] 2 additional days to program exercise sessions.  Add 2 additional days to program exercise sessions.      Initial Home Exercises Provided  09/29/17  09/29/17         Exercise Comments: Exercise Comments    Row Name 12/02/17 [id number]           Exercise Comments  First full day of exercise!  Patient was oriented to gym and equipment including functions, settings, policies, and  procedures.  Patient's individual exercise prescription and treatment plan were reviewed.  All starting workloads were established based on the results of the 6 minute walk test done at initial orientation visit.  The plan for exercise progression was also introduced and progression will be customized based on patient's performance and goals.          Exercise Goals and Review: Exercise Goals    Row Name 12/02/17 1059             Exercise Goals   Increase Physical Activity  Yes       Intervention  Provide advice, education, support and counseling about physical activity/exercise needs.;Develop an individualized exercise prescription for aerobic and resistive training based on initial evaluation findings, risk stratification, comorbidities and participant's  personal goals.       Expected Outcomes  Short Term: Attend rehab on a regular basis to increase amount of physical activity.;Long Term: Add in home exercise to make exercise part of routine and to increase amount of physical activity.;Long Term: Exercising regularly at least 3-5 days a week.       Increase Strength and Stamina  Yes       Intervention  Provide advice, education, support and counseling about physical activity/exercise needs.;Develop an individualized exercise prescription for aerobic and resistive training based on initial evaluation findings, risk stratification, comorbidities and participant's personal goals.       Expected Outcomes  Short Term: Increase workloads from initial exercise prescription for resistance, speed, and METs.;Short Term: Perform resistance training exercises routinely during rehab and add in resistance training at home;Long Term: Improve cardiorespiratory fitness, muscular endurance and strength as measured by increased METs and functional capacity (6MWT)       Able to understand and use rate of perceived exertion (RPE) scale  Yes       Intervention  Provide education and explanation on how to use RPE  scale       Expected Outcomes  Short Term: Able to use RPE daily in rehab to express subjective intensity level;Long Term:  Able to use RPE to guide intensity level when exercising independently       Able to understand and use Dyspnea scale  Yes       Intervention  Provide education and explanation on how to use Dyspnea scale       Expected Outcomes  Short Term: Able to use Dyspnea scale daily in rehab to express subjective sense of shortness of breath during exertion;Long Term: Able to use Dyspnea scale to guide intensity level when exercising independently       Knowledge and understanding of Target Heart Rate Range (THRR)  Yes       Intervention  Provide education and explanation of THRR including how the numbers were predicted and where they are located for reference       Expected Outcomes  Short Term: Able to state/look up THRR;Long Term: Able to use THRR to govern intensity when exercising independently;Short Term: Able to use daily as guideline for intensity in rehab       Able to check pulse independently  Yes       Intervention  Provide education and demonstration on how to check pulse in carotid and radial arteries.;Review the importance of being able to check your own pulse for safety during independent exercise       Expected Outcomes  Short Term: Able to explain why pulse checking is important during independent exercise;Long Term: Able to check pulse independently and accurately       Understanding of Exercise Prescription  Yes       Intervention  Provide education, explanation, and written materials on patient's individual exercise prescription       Expected Outcomes  Short Term: Able to explain program exercise prescription;Long Term: Able to explain home exercise prescription to exercise independently          Exercise Goals Re-Evaluation : Exercise Goals Re-Evaluation    Row Name 10/13/17 1405 11/10/17 1214 12/02/17 0834         Exercise Goal Re-Evaluation   Exercise  Goals Review  Increase Physical Activity;Able to understand and use rate of perceived exertion (RPE) scale;Increase Strength and Stamina;Able to understand and use Dyspnea scale  Increase Physical Activity;Able to understand and  use rate of perceived exertion (RPE) scale;Knowledge and understanding of Target Heart Rate Range (THRR);Understanding of Exercise Prescription;Increase Strength and Stamina;Able to understand and use Dyspnea scale  Increase Physical Activity;Increase Strength and Stamina;Able to understand and use rate of perceived exertion (RPE) scale     Comments  Anders has increased levels on all machines.  Staff will monitor progress  [place] has increased MET level to 2.6 and increased intensity on TM and RB.  He has increased weight to strength training to 5 lb  Reviewed RPE scale, THR and program prescription with pt today.  Pt voiced understanding and was given a copy of goals to take home.      Expected Outcomes  Short - Gaberiel will continue to attend regularly Long - Jaqwon will exercise on his own  [place] will attend regularly Long - [place] will increase MET level  Short: Use RPE daily to regulate intensity.  Long: Follow program prescription in THR.        Discharge Exercise Prescription (Final Exercise Prescription Changes): Exercise Prescription Changes - 11/10/17 1200      Response to Exercise   Blood Pressure (Admit)  124/70    Blood Pressure (Exit)  126/64    Heart Rate (Admit)  70 bpm    Heart Rate (Exercise)  80 bpm    Heart Rate (Exit)  69 bpm    Oxygen Saturation (Admit)  99 %    Oxygen Saturation (Exercise)  96 %    Oxygen Saturation (Exit)  99 %    Rating of Perceived Exertion (Exercise)  11    Perceived Dyspnea (Exercise)  1    Symptoms  none    Duration  Continue with 45 min of aerobic exercise without signs/symptoms of physical distress.    Intensity  THRR unchanged      Progression   Progression  Continue to progress workloads to maintain intensity  without signs/symptoms of physical distress.    Average METs  2.6      Resistance Training   Training Prescription  Yes    Weight  5 lb    Reps  10-15      Interval Training   Interval Training  No      Treadmill   MPH  2    Grade  0    Minutes  15    METs  2.53      Recumbant Bike   Level  4    RPM  42    Minutes  15    METs  2.7      Home Exercise Plan   Plans to continue exercise at  [employer] (comment) water aerobics at [place] 2 additional days to program exercise sessions.    Initial Home Exercises Provided  09/29/17       Nutrition:  Target Goals: Understanding of nutrition guidelines, daily intake of sodium <1541m, cholesterol <2062m calories 30% from fat and 7% or less from saturated fats, daily to have 5 or more servings of fruits and vegetables.  Biometrics: Pre Biometrics - 12/02/17 1101      Pre Biometrics   Height  5' 7.4" (1.712 m)    Weight  256 lb 12.8 oz (116.5 kg)    Waist Circumference  45 inches    Hip Circumference  46 inches    Waist to Hip Ratio  0.98 %    BMI (Calculated)  39.74  Nutrition Therapy Plan and Nutrition Goals: Nutrition Therapy & Goals - 11/10/17 1052      Nutrition Therapy   Diet  TLC, Heart Healthy    Protein (specify units)  9oz    Fiber  30 grams    Whole Grain Foods  3 servings    Saturated Fats  16 max. grams    Fruits and Vegetables  5 servings/day 8 optimal    Sodium  2000 grams      Personal Nutrition Goals   Nutrition Goal  Look for ways to include additional servings of vegetables in your daily diet. Steaming, sauteeing, grilling, and roasting are all great cooking methods for them.    Personal Goal #2  Pay closer attention to portions at meal times, particularly starchy foods. Utilize the plate method as discussed.    Personal Goal #3  Continue to look for and join local organizations that support your health goals, such as [employer], the gym, the community pool, and your  local doctor/ nutritonist team      Intervention Plan   Intervention  Prescribe, educate and counsel regarding individualized specific dietary modifications aiming towards targeted core components such as weight, hypertension, lipid management, diabetes, heart failure and other comorbidities.    Expected Outcomes  Short Term Goal: Understand basic principles of dietary content, such as calories, fat, sodium, cholesterol and nutrients.;Short Term Goal: A plan has been developed with personal nutrition goals set during dietitian appointment.;Long Term Goal: Adherence to prescribed nutrition plan.       Nutrition Assessments: Nutrition Assessments - 12/02/17 1124      MEDFICTS Scores   Pre Score  67       Nutrition Goals Re-Evaluation: Nutrition Goals Re-Evaluation    [place] Name 11/10/17 1107             Goals   Nutrition Goal  Look for ways to include additional servings of vegetables in your daily diet. Steaming, sauteeing, grilling, and roasting are all great cooking methods for them.       Comment  He has been trying to include more vegetables in his diet, which has traditionally been starch-heavy.       Expected Outcome  He will include servings of vegetables daily when planning meals         Personal Goal #2 Re-Evaluation   Personal Goal #2  Pay closer attention to portion sizes at meal times, particularly starchy foods. Utilize the plate method as discussed         Personal Goal #3 Re-Evaluation   Personal Goal #3  Continue to look for and join local organizations that support your health goals, such as [employer], the gym, the community pool and your local doctor/ nutritionist team          Nutrition Goals Discharge (Final Nutrition Goals Re-Evaluation): Nutrition Goals Re-Evaluation - 11/10/17 1107      Goals   Nutrition Goal  Look for ways to include additional servings of vegetables in your daily diet. Steaming, sauteeing, grilling, and roasting are all great cooking methods  for them.    Comment  He has been trying to include more vegetables in his diet, which has traditionally been starch-heavy.    Expected Outcome  He will include servings of vegetables daily when planning meals      Personal Goal #2 Re-Evaluation   Personal Goal #2  Pay closer attention to portion sizes at meal times, particularly starchy foods. Utilize the plate method as discussed  Personal Goal #3 Re-Evaluation   Personal Goal #3  Continue to look for and join local organizations that support your health goals, such as [employer], the gym, the community pool and your local doctor/ nutritionist team       Psychosocial: Target Goals: Acknowledge presence or absence of significant depression and/or stress, maximize coping skills, provide positive support system. Participant is able to verbalize types and ability to use techniques and skills needed for reducing stress and depression.   Initial Review & Psychosocial Screening:   Quality of Life Scores:  Quality of Life - 12/02/17 1116      Quality of Life Scores   Health/Function Pre  7.8 %    Socioeconomic Pre  16.83 %    Psych/Spiritual Pre  19.64 %    Family Pre  15.75 %    GLOBAL Pre  13.08 %      Scores of 19 and below usually indicate a poorer quality of life in these areas.  A difference of  2-3 points is a clinically meaningful difference.  A difference of 2-3 points in the total score of the Quality of Life Index has been associated with significant improvement in overall quality of life, self-image, physical symptoms, and general health in studies assessing change in quality of life.  PHQ-9: Recent Review Flowsheet Data    Depression screen [facility] 2/9 12/02/2017 10/06/2017 09/23/2017 09/20/2017 09/20/2017   Decreased Interest 2 2 - 3 0   Down, Depressed, Hopeless '2 1 1 2 ' 0   PHQ - 2 Score '4 3 1 5 ' 0   Altered sleeping 1 2 - 2 -   Tired, decreased energy 1 2 - 2 -   Change in appetite 1 2 - 3 -   Feeling bad or failure about yourself   1 2 - 2 -   Trouble concentrating 1 1 - 1 -   Moving slowly or fidgety/restless 1 1 - 1 -   Suicidal thoughts 0 1 0 1  -   PHQ-9 Score 10 14 - 17 -   Difficult doing work/chores Somewhat difficult - - Very difficult -     Interpretation of Total Score  Total Score Depression Severity:  1-4 = Minimal depression, 5-9 = Mild depression, 10-14 = Moderate depression, 15-19 = Moderately severe depression, 20-27 = Severe depression   Psychosocial Evaluation and Intervention: Psychosocial Evaluation - 10/06/17 1110      Psychosocial Evaluation & Interventions   Interventions  Encouraged to exercise with the program and follow exercise prescription;Stress management education;Relaxation education    Comments  Mr. Rhett Dery) has returned to this program due to more difficulty breathing and multiple cardiac and pulmonary health problems.  Counselor met with him today for initial psychosocial evaluation.  He is a 67 year old who has a strong support system with his daughter who lives close by and his ex-wife who is in the same community and they have become friends.  Quamere reports not sleeping well - approximately 5 hours/night intermittent sleep and states his appetite is "too good."  He has a history of depression and anxiety and has experienced some recent panic symptoms.  He is on medications for sleep and mood and reports an addition to these meds recently which he can already see a positive result in his mood.  He has multiple stressors with his health and some family conflict - particularly with his estranged son.  Jacquis has goals to improve his breathing; be healthier overall; and lose  some weight.  He has joined a gym to begin swimming for exercise as well and is looking for a local weight watchers group.  Counselor provided information on a local group for Aayan to consider.  His PHQ-9 initially at orientation on 3/19 was "17" indicating moderately severe symptoms of depression may be present.   Counselor reviewed with [place] and the score has decreased to a "14" in the moderate range with improvement noted and new medications have been introduced for this.  Counselor will follow with Izacc throughout the course of this program.      Expected Outcomes  [place] will exercise for his physical and mental health.  Short - [place] will look into weight watchers to help with his appetite and weight loss goals.      Continue Psychosocial Services   Follow up required by counselor       Psychosocial Re-Evaluation: Psychosocial Re-Evaluation    [place] Name 11/08/17 1109             Psychosocial Re-Evaluation   Current issues with  History of Depression;Current Psychotropic Meds       Comments  Counselor follow up with Asencion Partridge today reporting he has had several cardiac tests over the past month; including a heart cath and is feeling more positive about his condition now.  He is making great progress in this program losing weight; eating more healthy; on fewer medications; and sleeping almost 6-8 hours vs. 4/night previously.  Darrin reports no panic symptoms recently and minimal family conflict in the past month.  He is increasing his levels here as he is feeling increased stamina and strength.  Caleb has not joined [employer] yet, but it is still on his list to consider in the future for support for his weight loss journey.  Counselor commended Education officer, community for all of his progress made since coming into this program.  He admits he is "very proud of (him)self" and the progress he has made in such a short time.  Counselor will continue to provide support and follow up with [facility].       Expected Outcomes  Short:  Allen will continue to eat a more heart healthy diet for continued weight loss and meet with the nutritionist monthly that he is seeing.  Long:  Danner will continue to exercise consistently to breathe better and manage his stressors more positively.         Interventions  Stress  management education       Continue Psychosocial Services   Follow up required by staff          Psychosocial Discharge (Final Psychosocial Re-Evaluation): Psychosocial Re-Evaluation - 11/08/17 1109      Psychosocial Re-Evaluation   Current issues with  History of Depression;Current Psychotropic Meds    Comments  Counselor follow up with Asencion Partridge today reporting he has had several cardiac tests over the past month; including a heart cath and is feeling more positive about his condition now.  He is making great progress in this program losing weight; eating more healthy; on fewer medications; and sleeping almost 6-8 hours vs. 4/night previously.  Juanantonio reports no panic symptoms recently and minimal family conflict in the past month.  He is increasing his levels here as he is feeling increased stamina and strength.  Aden has not joined [employer] yet, but it is still on his list to consider in the future for support for his weight loss journey.  Counselor commended [place] for all  of his progress made since coming into this program.  He admits he is "very proud of (him)self" and the progress he has made in such a short time.  Counselor will continue to provide support and follow up with [facility].    Expected Outcomes  Short:  Shigeru will continue to eat a more heart healthy diet for continued weight loss and meet with the nutritionist monthly that he is seeing.  Long:  Ojas will continue to exercise consistently to breathe better and manage his stressors more positively.      Interventions  Stress management education    Continue Psychosocial Services   Follow up required by staff       Vocational Rehabilitation: Provide vocational rehab assistance to qualifying candidates.   Vocational Rehab Evaluation & Intervention:   Education: Education Goals: Education classes will be provided on a variety of topics geared toward better understanding of heart health and risk factor modification.  Participant will state understanding/return demonstration of topics presented as noted by education test scores.  Learning Barriers/Preferences: Learning Barriers/Preferences - 12/02/17 1109      Learning Barriers/Preferences   Learning Barriers  Hearing;Sight    Learning Preferences  Skilled Demonstration;Written Material;Computer/Internet;Group Instruction;Verbal Instruction       Education Topics:  AED/CPR: - Group verbal and written instruction with the use of models to demonstrate the basic use of the AED with the basic ABC's of resuscitation.   General Nutrition Guidelines/Fats and Fiber: -Group instruction provided by verbal, written material, models and posters to present the general guidelines for heart healthy nutrition. Gives an explanation and review of dietary fats and fiber.   Cardiac Rehab from 12/02/2017 in [facility] Cardiac and Pulmonary Rehab  Date  09/27/17  Educator  CR  Instruction Review Code  1- Verbalizes Understanding      Controlling Sodium/Reading Food Labels: -Group verbal and written material supporting the discussion of sodium use in heart healthy nutrition. Review and explanation with models, verbal and written materials for utilization of the food label.   Exercise Physiology & General Exercise Guidelines: - Group verbal and written instruction with models to review the exercise physiology of the cardiovascular system and associated critical values. Provides general exercise guidelines with specific guidelines to those with heart or lung disease.    Cardiac Rehab from 10/27/2016 in [facility] Cardiac and Pulmonary Rehab  Date  09/22/16  Educator  [facility]  Instruction Review Code (retired)  2- meets goals/outcomes      Aerobic Exercise & Resistance Training: - Gives group verbal and written instruction on the various components of exercise. Focuses on aerobic and resistive training programs and the benefits of this training and how to safely progress through these  programs..   Flexibility, Balance, Mind/Body Relaxation: Provides group verbal/written instruction on the benefits of flexibility and balance training, including mind/body exercise modes such as yoga, pilates and tai chi.  Demonstration and skill practice provided.   Cardiac Rehab from 12/02/2017 in [facility] Cardiac and Pulmonary Rehab  Date  11/10/17  Educator  AS  Instruction Review Code  1- Verbalizes Understanding      Stress and Anxiety: - Provides group verbal and written instruction about the health risks of elevated stress and causes of high stress.  Discuss the correlation between heart/lung disease and anxiety and treatment options. Review healthy ways to manage with stress and anxiety.   Cardiac Rehab from 10/27/2016 in [facility] Cardiac and Pulmonary Rehab  Date  08/13/16  Educator  TS  Instruction Review Code (  retired)  2- meets goals/outcomes      Depression: - Provides group verbal and written instruction on the correlation between heart/lung disease and depressed mood, treatment options, and the stigmas associated with seeking treatment.   Cardiac Rehab from 12/02/2017 in [facility] Cardiac and Pulmonary Rehab  Date  11/17/17  Educator  [facility]  Instruction Review Code  1- Verbalizes Understanding      Anatomy & Physiology of the Heart: - Group verbal and written instruction and models provide basic cardiac anatomy and physiology, with the coronary electrical and arterial systems. Review of Valvular disease and Heart Failure   Cardiac Rehab from 10/27/2016 in [facility] Cardiac and Pulmonary Rehab  Date  10/08/16  Educator  SB  Instruction Review Code (retired)  2- meets goals/outcomes      Cardiac Procedures: - Group verbal and written instruction to review commonly prescribed medications for heart disease. Reviews the medication, class of the drug, and side effects. Includes the steps to properly store meds and maintain the prescription regimen. (beta blockers and nitrates)   Cardiac  Rehab from 10/27/2016 in [facility] Cardiac and Pulmonary Rehab  Date  10/13/16  Educator  SB  Instruction Review Code (retired)  2- meets goals/outcomes      Cardiac Medications I: - Group verbal and written instruction to review commonly prescribed medications for heart disease. Reviews the medication, class of the drug, and side effects. Includes the steps to properly store meds and maintain the prescription regimen.   Cardiac Rehab from 12/02/2017 in [facility] Cardiac and Pulmonary Rehab  Date  11/12/17  Educator  [facility]  Instruction Review Code  1- Verbalizes Understanding      Cardiac Medications II: -Group verbal and written instruction to review commonly prescribed medications for heart disease. Reviews the medication, class of the drug, and side effects. (all other drug classes)   Cardiac Rehab from 12/02/2017 in [facility] Cardiac and Pulmonary Rehab  Date  10/15/17  Educator  [facility]  Instruction Review Code  1- Verbalizes Understanding       Go Sex-Intimacy & Heart Disease, Get SMART - Goal Setting: - Group verbal and written instruction through game format to discuss heart disease and the return to sexual intimacy. Provides group verbal and written material to discuss and apply goal setting through the application of the S.M.A.R.T. Method.   Cardiac Rehab from 10/27/2016 in [facility] Cardiac and Pulmonary Rehab  Date  10/13/16  Educator  SB  Instruction Review Code (retired)  2- meets goals/outcomes      Other Matters of the Heart: - Provides group verbal, written materials and models to describe Stable Angina and Peripheral Artery. Includes description of the disease process and treatment options available to the cardiac patient.   Cardiac Rehab from 10/27/2016 in [facility] Cardiac and Pulmonary Rehab  Date  10/08/16  Educator  SB  Instruction Review Code (retired)  2- meets goals/outcomes      Exercise & Equipment Safety: - Individual verbal instruction and demonstration of equipment use and  safety with use of the equipment.   Cardiac Rehab from 12/02/2017 in [facility] Cardiac and Pulmonary Rehab  Date  09/20/17  Educator  [facility]  Instruction Review Code  1- Verbalizes Understanding      Infection Prevention: - Provides verbal and written material to individual with discussion of infection control including proper hand washing and proper equipment cleaning during exercise session.   Cardiac Rehab from 12/02/2017 in [facility] Cardiac and Pulmonary Rehab  Date  09/20/17  Educator  [facility]  Instruction Review Code  1- Verbalizes Understanding      Falls Prevention: - Provides verbal and written material to individual with discussion of falls prevention and safety.   Cardiac Rehab from 12/02/2017 in [facility] Cardiac and Pulmonary Rehab  Date  09/20/17  Educator  [facility]  Instruction Review Code  1- Verbalizes Understanding      Diabetes: - Individual verbal and written instruction to review signs/symptoms of diabetes, desired ranges of glucose level fasting, after meals and with exercise. Acknowledge that pre and post exercise glucose checks will be done for 3 sessions at entry of program.   Know Your Numbers and Risk Factors: -Group verbal and written instruction about important numbers in your health.  Discussion of what are risk factors and how they play a role in the disease process.  Review of Cholesterol, Blood Pressure, Diabetes, and BMI and the role they play in your overall health.   Cardiac Rehab from 12/02/2017 in [facility] Cardiac and Pulmonary Rehab  Date  10/15/17  Educator  [facility]  Instruction Review Code  1- Verbalizes Understanding      Sleep Hygiene: -Provides group verbal and written instruction about how sleep can affect your health.  Define sleep hygiene, discuss sleep cycles and impact of sleep habits. Review good sleep hygiene tips.    Other: -Provides group and verbal instruction on various topics (see comments)   Knowledge Questionnaire Score: Knowledge Questionnaire Score -  12/02/17 1122      Knowledge Questionnaire Score   Pre Score  24/26 Focus for education Angina and Nutrition       Core Components/Risk Factors/Patient Goals at Admission: Personal Goals and Risk Factors at Admission - 12/02/17 1102      Core Components/Risk Factors/Patient Goals on Admission    Weight Management  Yes;Obesity;Weight Loss    Intervention  Weight Management: Develop a combined nutrition and exercise program designed to reach desired caloric intake, while maintaining appropriate intake of nutrient and fiber, sodium and fats, and appropriate energy expenditure required for the weight goal.;Weight Management: Provide education and appropriate resources to help participant work on and attain dietary goals.;Weight Management/Obesity: Establish reasonable short term and long term weight goals.;Obesity: Provide education and appropriate resources to help participant work on and attain dietary goals.    Admit Weight  256 lb 12.8 oz (116.5 kg)    Goal Weight: Short Term  252 lb (114.3 kg)    Goal Weight: Long Term  180 lb (81.6 kg)    Expected Outcomes  Short Term: Continue to assess and modify interventions until short term weight is achieved;Long Term: Adherence to nutrition and physical activity/exercise program aimed toward attainment of established weight goal;Weight Loss: Understanding of general recommendations for a balanced deficit meal plan, which promotes 1-2 lb weight loss per week and includes a negative energy balance of [phone] kcal/d;Understanding recommendations for meals to include 15-35% energy as protein, 25-35% energy from fat, 35-60% energy from carbohydrates, less than 224m of dietary cholesterol, 20-35 gm of total fiber daily;Understanding of distribution of calorie intake throughout the day with the consumption of 4-5 meals/snacks    Improve shortness of breath with ADL's  Yes    Intervention  Provide education, individualized exercise plan and daily activity  instruction to help decrease symptoms of SOB with activities of daily living.    Expected Outcomes  Short Term: Improve cardiorespiratory fitness to achieve a reduction of symptoms when performing ADLs;Long Term: Be able to perform more ADLs without symptoms or delay the onset  of symptoms    Heart Failure  Yes    Intervention  Provide a combined exercise and nutrition program that is supplemented with education, support and counseling about heart failure. Directed toward relieving symptoms such as shortness of breath, decreased exercise tolerance, and extremity edema.    Expected Outcomes  Improve functional capacity of life;Short term: Attendance in program 2-3 days a week with increased exercise capacity. Reported lower sodium intake. Reported increased fruit and vegetable intake. Reports medication compliance.;Short term: Daily weights obtained and reported for increase. Utilizing diuretic protocols set by physician.;Long term: Adoption of self-care skills and reduction of barriers for early signs and symptoms recognition and intervention leading to self-care maintenance.    Hypertension  Yes    Intervention  Provide education on lifestyle modifcations including regular physical activity/exercise, weight management, moderate sodium restriction and increased consumption of fresh fruit, vegetables, and low fat dairy, alcohol moderation, and smoking cessation.;Monitor prescription use compliance.    Expected Outcomes  Short Term: Continued assessment and intervention until BP is < 140/13m HG in hypertensive participants. < 130/854mHG in hypertensive participants with diabetes, heart failure or chronic kidney disease.;Long Term: Maintenance of blood pressure at goal levels.    Lipids  Yes    Intervention  Provide education and support for participant on nutrition & aerobic/resistive exercise along with prescribed medications to achieve LDL <7017mHDL >52m45m  Expected Outcomes  Short Term: Participant  states understanding of desired cholesterol values and is compliant with medications prescribed. Participant is following exercise prescription and nutrition guidelines.;Long Term: Cholesterol controlled with medications as prescribed, with individualized exercise RX and with personalized nutrition plan. Value goals: LDL < 70mg57mL > 40 mg.    Stress  Yes    Intervention  Offer individual and/or small group education and counseling on adjustment to heart disease, stress management and health-related lifestyle change. Teach and support self-help strategies.;Refer participants experiencing significant psychosocial distress to appropriate mental health specialists for further evaluation and treatment. When possible, include family members and significant others in education/counseling sessions.    Expected Outcomes  Short Term: Participant demonstrates changes in health-related behavior, relaxation and other stress management skills, ability to obtain effective social support, and compliance with psychotropic medications if prescribed.;Long Term: Emotional wellbeing is indicated by absence of clinically significant psychosocial distress or social isolation.       Core Components/Risk Factors/Patient Goals Review:  Goals and Risk Factor Review    Row Name 10/21/17 1355             Core Components/Risk Factors/Patient Goals Review   Review  CarmeBarnesed to say he is getting a heart cath this coming Wednesday 10/27/2017 since Dr. GollaRockey Situed to rule out more heart problems since CarmeLennoxhort of breath.           Core Components/Risk Factors/Patient Goals at Discharge (Final Review):  Goals and Risk Factor Review - 10/21/17 1355      Core Components/Risk Factors/Patient Goals Review   Review  CarmeTined to say he is getting a heart cath this coming Wednesday 10/27/2017 since Dr. GollaRockey Situed to rule out more heart problems since CarmeItzaelhort of breath.        ITP Comments: ITP  Comments    Row Name 10/11/17 0826 10/21/17 1354 10/28/17 1058 11/05/17 1040 11/08/17 0822   ITP Comments   30 day review completed. ITP sent to Dr. Mark Emily Filbertctor of [facility] with ITP unless changes are made  by physician  Caroll called to say he is getting a heart cath this coming Wednesday since Dr. Rockey Situ wanted to rule out more heart problems since Jasman is short of breath.   Pt had recent cath and has a referral to Cardiac Rehab.  Staff will follow up with cardiologist   Patient returned today for Exercise. Will continue to monitor and do goals once he has regular attendance.   30 day review completed. ITP sent to Dr. Emily Filbert Director of [place]. Continue with ITP unless changes are made by physician   Row Name 11/17/17 1147 11/22/17 1641 12/02/17 1049       ITP Comments   Pt reported "just having trouble breathing". O2 sats in high 90's, no difficulty talking with exercise. Stopped exercise after completing 2nd machine and sat down. When questioned he stated "I just don't feel good". While I was on the TM, I began sweating profusely so I stopped. It was better on the NuStep. I started taking my nebulizer over the weekend." A check of current vial in use revelaled expiration of 11/2016. Pt informed to discontinue use of this vial. Exercise pO2 98, hr 88 RPE 11, RPD 2. Pt states he had cardiac cath 2 weeks ago. Pt refused to go to ER "my dog is home alone". Call to Dr Alphonsus Sias cardiology-nurse stated that there were no physicians in the office today. Pt said his PCP, Dr Guadelupe Sabin, works next door to him and is in the office today. With VS 152/80, pO2 99, HR 88 and "feeling much better", pt left to drive straight to Dr Rexene Alberts for f/up. Dr Rexene Alberts office notified, with VS and s/s update. Pt given number and instructed to call gym/office after seeing MD.   Asencion Partridge called to let us know that he was back in the emergency room.  He saw his pulmonologist today and that they  think it nerve damage that is causing several of his problems.  He will be getting that theory tested.  He also has a pacer check and blood work with cardiology and get sonogram of carotids.  Dr. Rockey Situ wants [place] on heart monitor for exercise. We will discharge Ayvin from Pulmonary Rehab and get him back into Cardiac Rehab for him to wear the monitor.  He is going to stop by this week to pick up paperwork and set up his start date for Cardiac.   Tannar started in Cardiac Rehab today.  Documentation for diagnosis can be found in [facility] enounter from 5/21 and admission from 5/15.  New Cardaic ITP created and sent to Dr. Emily Filbert, Medical Director for review and signature.         Comments: Initial ITP

## 2017-12-07 ENCOUNTER — Encounter: Payer: Medicare Other | Attending: Family Medicine

## 2017-12-07 DIAGNOSIS — I5022 Chronic systolic (congestive) heart failure: Secondary | ICD-10-CM | POA: Insufficient documentation

## 2017-12-08 ENCOUNTER — Other Ambulatory Visit: Payer: Medicare Other

## 2017-12-09 ENCOUNTER — Telehealth: Payer: Self-pay | Admitting: Cardiovascular Disease

## 2017-12-09 NOTE — Telephone Encounter (Signed)
Pt is schedule for an appt with his PCP on 6/13, he asks if he can get his labs orders to be performed at his PCP office. Please call and advise.

## 2017-12-09 NOTE — Telephone Encounter (Signed)
Patient states that he has upcoming appointment with his PCP and would like a lab slip to take with him in case he should have labs done there. Printed lab slip and placed up front for him to pick up when possible. He was appreciative for the call back and had no further questions at this time.

## 2017-12-10 ENCOUNTER — Other Ambulatory Visit: Payer: Medicare Other

## 2017-12-10 ENCOUNTER — Telehealth: Payer: Self-pay | Admitting: Cardiology

## 2017-12-10 ENCOUNTER — Telehealth: Payer: Self-pay | Admitting: Cardiovascular Disease

## 2017-12-10 LAB — CUP PACEART INCLINIC DEVICE CHECK
Date Time Interrogation Session: 20190607160439
Implantable Lead Implant Date: 20131003
Implantable Lead Implant Date: 20131003
Implantable Lead Location: 753858
Implantable Lead Location: 753860
Implantable Pulse Generator Implant Date: 20131003
Pulse Gen Serial Number: 7055215

## 2017-12-10 NOTE — Telephone Encounter (Signed)
Spoke with patient and he wants to have dental extractions done and needs instructions on Plavix and Xarelto as well as clearance. Advised that I would send this over to Dr. Mariah MillingGollan and we would then give him a call back. He was appreciative for the call with no further questions at this time.

## 2017-12-10 NOTE — Telephone Encounter (Signed)
Spoke w/ pt and requested that he send a manual transmission b/c his home monitor has not updated in at least 14 days.   

## 2017-12-10 NOTE — Telephone Encounter (Signed)
Patient calling to speak with Pam about dental extractions (3) being done at [facility] [phone].  Patient declined to provide further information besides he wants clearance prior to scheduling and demands to speak with Rinaldo CloudPamela.  When asked further information patient stated this could be obtained from [facility] .  Patient audibly irritated.    Called [facility] .  They will speak with provider on Monday and send over a fax but he did not feel like patient needed to stop anticoag and therefore didn't request clearance.  Patient was aware of this and demands clearance directly from Dr. Mariah MillingGollan before he will schedule surgery.

## 2017-12-13 NOTE — Telephone Encounter (Signed)
Note in the computer indicate that oral surgery does not need him to stop anticoagulation With that said he may have significant bleeding depending on how aggressive the surgery is Certainly his choice to stay on both Plavix and xarelto but there is a risk of significant bleeding Would be very except or risk to stop the xarelto 2 days prior to the procedure and restart following the procedure If given the opportunity would stay on Plavix, could skip dose before and morning before the procedure Could restart Plavix evening following the procedure with the xarelto

## 2017-12-14 ENCOUNTER — Encounter: Payer: Self-pay | Admitting: *Deleted

## 2017-12-14 ENCOUNTER — Telehealth: Payer: Self-pay | Admitting: *Deleted

## 2017-12-14 DIAGNOSIS — I5022 Chronic systolic (congestive) heart failure: Secondary | ICD-10-CM

## 2017-12-14 NOTE — Telephone Encounter (Signed)
Spoke with patient and reviewed Dr. Windell HummingbirdGollan's recommendations for his dental extraction. He verbalized understanding of all information with no further questions at this time.

## 2017-12-14 NOTE — Telephone Encounter (Signed)
Called to check on pt as he has been out since 5/30.  He had a death in the family [place] go [place]. He also took time to visit with his family. 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  He has also been sick.  He is hoping to try to come in on Thursday.

## 2017-12-16 ENCOUNTER — Other Ambulatory Visit: Payer: Medicare Other

## 2017-12-16 DIAGNOSIS — I5022 Chronic systolic (congestive) heart failure: Secondary | ICD-10-CM | POA: Diagnosis not present

## 2017-12-16 DIAGNOSIS — J449 Chronic obstructive pulmonary disease, unspecified: Secondary | ICD-10-CM

## 2017-12-16 NOTE — Progress Notes (Signed)
Daily Session Note  Patient Details  Name: Kevin Pearson MRN: [id number] Date of Birth: 03/18/1951 Referring Provider:     Cardiac Rehab from 12/02/2017 in [facility]  Referring Provider  Gollan, Timothy MD      Encounter Date: 12/16/2017  Check In: Session Check In - 12/16/17 0833      Check-In   Location  [facility]    Staff Present  Carroll Enterkin, RN, BSN;Jessica Hawkins, MA, RCEP, CCRP, Exercise Physiologist;Amanda Sommer, BA, ACSM CEP, Exercise Physiologist    Supervising physician immediately available to respond to emergencies  See telemetry face sheet for immediately available ER MD    Medication changes reported      No    Fall or balance concerns reported     No    Warm-up and Cool-down  Performed on first and last piece of equipment    Resistance Training Performed  Yes    VAD Patient?  No      Pain Assessment   Currently in Pain?  No/denies    Multiple Pain Sites  No          Social History   Tobacco Use  Smoking Status Former Smoker  . Packs/day: 1.00  . Years: 40.00  . Pack years: 40.00  . Types: Cigarettes  . Last attempt to quit: 08/2015  . Years since quitting: 2.3  Smokeless Tobacco Never Used    Goals Met:  Independence with exercise equipment Exercise tolerated well No report of cardiac concerns or symptoms Strength training completed today  Goals Unmet:  Not Applicable  Comments: Pt able to follow exercise prescription today without complaint.  Will continue to monitor for progression.    Dr. Mark Miller is Medical Director for [facility] and [name]. 
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
## 2017-12-21 ENCOUNTER — Telehealth: Payer: Self-pay | Admitting: Cardiovascular Disease

## 2017-12-21 ENCOUNTER — Ambulatory Visit (INDEPENDENT_AMBULATORY_CARE_PROVIDER_SITE_OTHER): Payer: Medicare Other | Admitting: Cardiovascular Disease

## 2017-12-21 ENCOUNTER — Encounter: Payer: Self-pay | Admitting: Cardiovascular Disease

## 2017-12-21 ENCOUNTER — Encounter: Payer: Medicare Other | Attending: Family Medicine

## 2017-12-21 VITALS — BP 108/70 | HR 72 | Ht 67.0 in | Wt 257.5 lb

## 2017-12-21 DIAGNOSIS — R600 Localized edema: Secondary | ICD-10-CM

## 2017-12-21 DIAGNOSIS — K219 Gastro-esophageal reflux disease without esophagitis: Secondary | ICD-10-CM | POA: Diagnosis not present

## 2017-12-21 DIAGNOSIS — I252 Old myocardial infarction: Secondary | ICD-10-CM | POA: Insufficient documentation

## 2017-12-21 DIAGNOSIS — I10 Essential (primary) hypertension: Secondary | ICD-10-CM | POA: Insufficient documentation

## 2017-12-21 DIAGNOSIS — J432 Centrilobular emphysema: Secondary | ICD-10-CM | POA: Diagnosis not present

## 2017-12-21 DIAGNOSIS — G4733 Obstructive sleep apnea (adult) (pediatric): Secondary | ICD-10-CM | POA: Diagnosis not present

## 2017-12-21 DIAGNOSIS — Z87820 Personal history of traumatic brain injury: Secondary | ICD-10-CM | POA: Diagnosis not present

## 2017-12-21 DIAGNOSIS — I4891 Unspecified atrial fibrillation: Secondary | ICD-10-CM | POA: Diagnosis not present

## 2017-12-21 DIAGNOSIS — I5022 Chronic systolic (congestive) heart failure: Secondary | ICD-10-CM

## 2017-12-21 DIAGNOSIS — I255 Ischemic cardiomyopathy: Secondary | ICD-10-CM | POA: Insufficient documentation

## 2017-12-21 DIAGNOSIS — N183 Chronic kidney disease, stage 3 (moderate): Secondary | ICD-10-CM | POA: Diagnosis not present

## 2017-12-21 DIAGNOSIS — R0602 Shortness of breath: Secondary | ICD-10-CM

## 2017-12-21 DIAGNOSIS — N179 Acute kidney failure, unspecified: Secondary | ICD-10-CM | POA: Diagnosis not present

## 2017-12-21 DIAGNOSIS — I2510 Atherosclerotic heart disease of native coronary artery without angina pectoris: Secondary | ICD-10-CM | POA: Diagnosis present

## 2017-12-21 DIAGNOSIS — M329 Systemic lupus erythematosus, unspecified: Secondary | ICD-10-CM | POA: Insufficient documentation

## 2017-12-21 DIAGNOSIS — E785 Hyperlipidemia, unspecified: Secondary | ICD-10-CM | POA: Insufficient documentation

## 2017-12-21 DIAGNOSIS — Z87891 Personal history of nicotine dependence: Secondary | ICD-10-CM | POA: Insufficient documentation

## 2017-12-21 DIAGNOSIS — I25118 Atherosclerotic heart disease of native coronary artery with other forms of angina pectoris: Secondary | ICD-10-CM | POA: Insufficient documentation

## 2017-12-21 DIAGNOSIS — J449 Chronic obstructive pulmonary disease, unspecified: Secondary | ICD-10-CM | POA: Insufficient documentation

## 2017-12-21 MED ORDER — METOPROLOL SUCCINATE ER 25 MG PO TB24: 25.0000 mg | ORAL_TABLET | Freq: Every day | ORAL | 3 refills | Status: DC

## 2017-12-21 MED ORDER — TORSEMIDE 20 MG PO TABS: 20.0000 mg | ORAL_TABLET | ORAL | 0 refills | Status: DC | PRN

## 2017-12-21 MED ORDER — TORSEMIDE 20 MG PO TABS: ORAL_TABLET | ORAL | 3 refills | Status: DC

## 2017-12-21 NOTE — Patient Instructions (Addendum)
Medication Instructions:   Hold the lasix Start torsemide 40 mg in the Am and 20 mg in the PM  Take extra torsemide 20 mg as needed for shortness of breath  Labwork:  No new labs needed  Testing/Procedures:  No further testing at this time   Follow-Up: It was a pleasure seeing you in the office today. Please call us if you have new issues that need to be addressed before your next appt.  [phone]  Your physician wants you to follow-up in: 6 months.  You will receive a reminder letter in the mail two months in advance. If you don't receive a letter, please call our office to schedule the follow-up appointment.  If you need a refill on your cardiac medications before your next appointment, please call your pharmacy.  For educational health videos Log in to : [email or web address] Or : [email or web address], password : [email or web address]

## 2017-12-21 NOTE — Telephone Encounter (Signed)
Spoke with patient in detail and reviewed metoprolol medication. He was agreeable to resume the metoprolol and sent in prescription for that. He verbalized understanding with no further questions at this time.

## 2017-12-22 ENCOUNTER — Encounter: Payer: Self-pay | Admitting: *Deleted

## 2017-12-22 DIAGNOSIS — I5022 Chronic systolic (congestive) heart failure: Secondary | ICD-10-CM

## 2017-12-22 NOTE — Progress Notes (Signed)
Cardiac Individual Treatment Plan  Patient Details  Name: Kevin Pearson MRN: [id number] Date of Birth: 03/04/1951 Referring Provider:     Cardiac Rehab from 12/02/2017 in [facility] Cardiac and Pulmonary Rehab  Referring Provider  Ida Rogue MD      Initial Encounter Date:    Cardiac Rehab from 12/02/2017 in [facility] Cardiac and Pulmonary Rehab  Date  12/02/17  Referring Provider  Ida Rogue MD      Visit Diagnosis: Heart failure, chronic systolic ([place])  Patient's Home Medications on Admission:  Current Outpatient Medications:  .  acetaminophen (TYLENOL) 325 MG tablet, Take 650 mg by mouth every 6 (six) hours as needed. , Disp: , Rfl:  .  albuterol (PROVENTIL HFA;VENTOLIN HFA) 108 (90 Base) MCG/ACT inhaler, Inhale 2 puffs into the lungs every 6 (six) hours as needed for wheezing or shortness of breath., Disp: 1 Inhaler, Rfl: 2 .  budesonide (PULMICORT) 0.5 MG/2ML nebulizer solution, Inhale 2 mLs into the lungs daily as needed. , Disp: , Rfl: 1 .  busPIRone (BUSPAR) 10 MG tablet, Take 10 mg by mouth 3 (three) times daily., Disp: , Rfl:  .  clopidogrel (PLAVIX) 75 MG tablet, Take 75 mg by mouth daily., Disp: , Rfl:  .  divalproex (DEPAKOTE) 500 MG DR tablet, Take 500 mg by mouth 2 (two) times daily. , Disp: , Rfl:  .  Elastic Bandages & Supports (T.E.D. BELOW KNEE/X-LARGE) MISC, 1 application by Does not apply route daily., Disp: 6 each, Rfl: 0 .  gabapentin (NEURONTIN) 300 MG capsule, Take 600 mg by mouth 2 (two) times daily. , Disp: , Rfl:  .  ipratropium-albuterol (DUONEB) 0.5-2.5 (3) MG/3ML SOLN, Take 3 mLs by nebulization every 6 (six) hours as needed (wheezing/shortness of breath)., Disp: , Rfl:  .  losartan (COZAAR) 25 MG tablet, Take 1 tablet (25 mg total) by mouth daily., Disp: 30 tablet, Rfl: 11 .  metoprolol succinate (TOPROL XL) 25 MG 24 hr tablet, Take 1 tablet (25 mg total) by mouth daily., Disp: 90 tablet, Rfl: 3 .  nitroGLYCERIN (NITROSTAT) 0.4 MG SL tablet, Place 0.4 mg  under the tongue every 5 (five) minutes as needed for chest pain., Disp: , Rfl:  .  omeprazole (PRILOSEC) 40 MG capsule, Take 40 mg by mouth daily., Disp: , Rfl:  .  rivaroxaban (XARELTO) 20 MG TABS tablet, Take 20 mg by mouth at bedtime., Disp: , Rfl:  .  rosuvastatin (CRESTOR) 20 MG tablet, Take 20 mg by mouth daily., Disp: , Rfl:  .  senna (SENOKOT) 8.6 MG tablet, Take 2 tablets by mouth as needed. , Disp: , Rfl:  .  spironolactone (ALDACTONE) 25 MG tablet, Take 0.5 tablets (12.5 mg total) by mouth daily., Disp: 45 tablet, Rfl: 3 .  tamsulosin (FLOMAX) 0.4 MG CAPS capsule, TAKE ONE CAPSULE BY MOUTH EVERY DAY AFTER SUPPER, Disp: 30 capsule, Rfl: 11 .  torsemide (DEMADEX) 20 MG tablet, 2 tablets (40 mg) in the AM and 1 tablet (20 mg) at lunch., Disp: 270 tablet, Rfl: 3 .  torsemide (DEMADEX) 20 MG tablet, Take 1 tablet (20 mg total) by mouth as needed (As needed for swelling (DO NOT PUT IN PILLPACK))., Disp: 90 tablet, Rfl: 0 .  traZODone (DESYREL) 50 MG tablet, Take 100 mg by mouth at bedtime., Disp: , Rfl:  .  vortioxetine HBr (TRINTELLIX) 5 MG TABS, Take 5 mg by mouth daily. , Disp: , Rfl:   Past Medical History: Past Medical History:  Diagnosis Date  . Acid reflux   .  Anxiety   . Arthritis   . Atrial fibrillation ([place])   . CHF (congestive heart failure) ([place])   . Chronic orthostatic hypotension   . Clotting disorder ([place])   . COPD (chronic obstructive pulmonary disease) ([place])   . Depression   . Elevated PSA   . Heart attack ([place])   . Heart disease   . Heart failure ([place])   . Hepatitis   . High cholesterol   . Hypertension   . Sleep apnea   . TBI (traumatic brain injury) ([place])   . Urinary retention     Tobacco Use: Social History   Tobacco Use  Smoking Status Former Smoker  . Packs/day: 1.00  . Years: 40.00  . Pack years: 40.00  . Types: Cigarettes  . Last attempt to quit: 08/2015  . Years since quitting: 2.3  Smokeless Tobacco Never Used    Labs: Recent Review  [facility] manager for [facility] Cardiac and Pulmonary Rehab Latest Ref Rng & Units 11/23/2017   Cholestrol 100 - 199 mg/dL 114   LDLCALC 0 - 99 mg/dL 60   HDL >39 mg/dL 30(L)   Trlycerides 0 - 149 mg/dL 119       Exercise Target Goals:    Exercise Program Goal: Individual exercise prescription set using results from initial 6 min walk test and THRR while considering  patient's activity barriers and safety.   Exercise Prescription Goal: Initial exercise prescription builds to 30-45 minutes a day of aerobic activity, 2-3 days per week.  Home exercise guidelines will be given to patient during program as part of exercise prescription that the participant will acknowledge.  Activity Barriers & Risk Stratification: Activity Barriers & Cardiac Risk Stratification - 12/02/17 1054      Activity Barriers & Cardiac Risk Stratification   Activity Barriers  Deconditioning;Muscular Weakness;Shortness of Breath;Back Problems;Joint Problems;Decreased Ventricular Function;Balance Concerns;History of Falls;Neck/Spine Problems    Cardiac Risk Stratification  High       6 Minute Walk: 6 Minute Walk    Row Name 12/02/17 1052         6 Minute Walk   Phase  Initial Using Pulmonary Admission Test from 09/20/17     Distance  1002 feet     Distance % Change  23.8 %     Walk Time  6 minutes     # of Rest Breaks  0     MPH  1.9     METS  1.97     RPE  15     Perceived Dyspnea   2     VO2 Peak  6.88     Symptoms  No     Resting HR  70 bpm     Resting BP  128/56     Resting Oxygen Saturation   99 %     Exercise Oxygen Saturation  during 6 min walk  97 %     Max Ex. HR  104 bpm     Max Ex. BP  136/84     2 Minute Post BP  134/74       Interval HR   1 Minute HR  92     2 Minute HR  91     3 Minute HR  93     4 Minute HR  89     5 Minute HR  97     6 Minute HR  104     2 Minute Post HR  72  Interval Heart Rate?  Yes       Interval Oxygen   Interval Oxygen?  Yes     Baseline Oxygen  Saturation %  99 %     1 Minute Oxygen Saturation %  97 %     1 Minute Liters of Oxygen  0 L Room Air     2 Minute Oxygen Saturation %  97 %     2 Minute Liters of Oxygen  0 L     3 Minute Oxygen Saturation %  97 %     3 Minute Liters of Oxygen  0 L     4 Minute Oxygen Saturation %  98 %     4 Minute Liters of Oxygen  0 L     5 Minute Oxygen Saturation %  97 %     5 Minute Liters of Oxygen  0 L     6 Minute Oxygen Saturation %  97 %     6 Minute Liters of Oxygen  0 L     2 Minute Post Oxygen Saturation %  99 %     2 Minute Post Liters of Oxygen  0 L        Oxygen Initial Assessment:   Oxygen Re-Evaluation:   Oxygen Discharge (Final Oxygen Re-Evaluation):   Initial Exercise Prescription: Initial Exercise Prescription - 12/02/17 1000      Date of Initial Exercise RX and Referring Provider   Date  12/02/17    Referring Provider  Ida Rogue MD      Treadmill   MPH  2    Grade  0    Minutes  15    METs  2.53      NuStep   Level  3    SPM  80    Minutes  15    METs  2.3      Arm Ergometer   Level  1    RPM  50    Minutes  15    METs  2      Prescription Details   Frequency (times per week)  3    Duration  Progress to 45 minutes of aerobic exercise without signs/symptoms of physical distress      Intensity   THRR 40-80% of Max Heartrate  104-138    Ratings of Perceived Exertion  11-13    Perceived Dyspnea  0-4      Progression   Progression  Continue to progress workloads to maintain intensity without signs/symptoms of physical distress.      Resistance Training   Training Prescription  Yes    Weight  4 lbs    Reps  10-15       Perform Capillary Blood Glucose checks as needed.  Exercise Prescription Changes: Exercise Prescription Changes    Row Name 11/10/17 1200             Response to Exercise   Blood Pressure (Admit)  124/70       Blood Pressure (Exit)  126/64       Heart Rate (Admit)  70 bpm       Heart Rate (Exercise)  80 bpm        Heart Rate (Exit)  69 bpm       Oxygen Saturation (Admit)  99 %       Oxygen Saturation (Exercise)  96 %       Oxygen Saturation (Exit)  99 %  Rating of Perceived Exertion (Exercise)  11       Perceived Dyspnea (Exercise)  1       Symptoms  none       Duration  Continue with 45 min of aerobic exercise without signs/symptoms of physical distress.       Intensity  THRR unchanged         Progression   Progression  Continue to progress workloads to maintain intensity without signs/symptoms of physical distress.       Average METs  2.6         Resistance Training   Training Prescription  Yes       Weight  5 lb       Reps  10-15         Interval Training   Interval Training  No         Treadmill   MPH  2       Grade  0       Minutes  15       METs  2.53         Recumbant Bike   Level  4       RPM  42       Minutes  15       METs  2.7         Home Exercise Plan   Plans to continue exercise at  [employer] (comment) water aerobics at [place] 2 additional days to program exercise sessions.       Initial Home Exercises Provided  09/29/17          Exercise Comments: Exercise Comments    Row Name 12/02/17 [phone]           Exercise Comments  First full day of exercise!  Patient was oriented to gym and equipment including functions, settings, policies, and procedures.  Patient's individual exercise prescription and treatment plan were reviewed.  All starting workloads were established based on the results of the 6 minute walk test done at initial orientation visit.  The plan for exercise progression was also introduced and progression will be customized based on patient's performance and goals.          Exercise Goals and Review: Exercise Goals    Row Name 12/02/17 1059             Exercise Goals   Increase Physical Activity  Yes       Intervention  Provide advice, education, support and counseling about physical  activity/exercise needs.;Develop an individualized exercise prescription for aerobic and resistive training based on initial evaluation findings, risk stratification, comorbidities and participant's personal goals.       Expected Outcomes  Short Term: Attend rehab on a regular basis to increase amount of physical activity.;Long Term: Add in home exercise to make exercise part of routine and to increase amount of physical activity.;Long Term: Exercising regularly at least 3-5 days a week.       Increase Strength and Stamina  Yes       Intervention  Provide advice, education, support and counseling about physical activity/exercise needs.;Develop an individualized exercise prescription for aerobic and resistive training based on initial evaluation findings, risk stratification, comorbidities and participant's personal goals.       Expected Outcomes  Short Term: Increase workloads from initial exercise prescription for resistance, speed, and METs.;Short Term: Perform resistance training exercises routinely during rehab and  add in resistance training at home;Long Term: Improve cardiorespiratory fitness, muscular endurance and strength as measured by increased METs and functional capacity (6MWT)       Able to understand and use rate of perceived exertion (RPE) scale  Yes       Intervention  Provide education and explanation on how to use RPE scale       Expected Outcomes  Short Term: Able to use RPE daily in rehab to express subjective intensity level;Long Term:  Able to use RPE to guide intensity level when exercising independently       Able to understand and use Dyspnea scale  Yes       Intervention  Provide education and explanation on how to use Dyspnea scale       Expected Outcomes  Short Term: Able to use Dyspnea scale daily in rehab to express subjective sense of shortness of breath during exertion;Long Term: Able to use Dyspnea scale to guide intensity level when exercising independently        Knowledge and understanding of Target Heart Rate Range (THRR)  Yes       Intervention  Provide education and explanation of THRR including how the numbers were predicted and where they are located for reference       Expected Outcomes  Short Term: Able to state/look up THRR;Long Term: Able to use THRR to govern intensity when exercising independently;Short Term: Able to use daily as guideline for intensity in rehab       Able to check pulse independently  Yes       Intervention  Provide education and demonstration on how to check pulse in carotid and radial arteries.;Review the importance of being able to check your own pulse for safety during independent exercise       Expected Outcomes  Short Term: Able to explain why pulse checking is important during independent exercise;Long Term: Able to check pulse independently and accurately       Understanding of Exercise Prescription  Yes       Intervention  Provide education, explanation, and written materials on patient's individual exercise prescription       Expected Outcomes  Short Term: Able to explain program exercise prescription;Long Term: Able to explain home exercise prescription to exercise independently          Exercise Goals Re-Evaluation : Exercise Goals Re-Evaluation    Row Name 11/10/17 1214 12/02/17 0834 12/14/17 1428         Exercise Goal Re-Evaluation   Exercise Goals Review  Increase Physical Activity;Able to understand and use rate of perceived exertion (RPE) scale;Knowledge and understanding of Target Heart Rate Range (THRR);Understanding of Exercise Prescription;Increase Strength and Stamina;Able to understand and use Dyspnea scale  Increase Physical Activity;Increase Strength and Stamina;Able to understand and use rate of perceived exertion (RPE) scale  -     Comments  Carvel has increased MET level to 2.6 and increased intensity on TM and RB.  He has increased weight to strength training to 5 lb  Reviewed RPE scale, THR and  program prescription with pt today.  Pt voiced understanding and was given a copy of goals to take home.   Out since last review     Expected Outcomes  Short - Maxamus will attend regularly Long - Yoel will increase MET level  Short: Use RPE daily to regulate intensity.  Long: Follow program prescription in [facility].  -        Discharge Exercise Prescription (Final Exercise  Prescription Changes): Exercise Prescription Changes - 11/10/17 1200      Response to Exercise   Blood Pressure (Admit)  124/70    Blood Pressure (Exit)  126/64    Heart Rate (Admit)  70 bpm    Heart Rate (Exercise)  80 bpm    Heart Rate (Exit)  69 bpm    Oxygen Saturation (Admit)  99 %    Oxygen Saturation (Exercise)  96 %    Oxygen Saturation (Exit)  99 %    Rating of Perceived Exertion (Exercise)  11    Perceived Dyspnea (Exercise)  1    Symptoms  none    Duration  Continue with 45 min of aerobic exercise without signs/symptoms of physical distress.    Intensity  THRR unchanged      Progression   Progression  Continue to progress workloads to maintain intensity without signs/symptoms of physical distress.    Average METs  2.6      Resistance Training   Training Prescription  Yes    Weight  5 lb    Reps  10-15      Interval Training   Interval Training  No      Treadmill   MPH  2    Grade  0    Minutes  15    METs  2.53      Recumbant Bike   Level  4    RPM  42    Minutes  15    METs  2.7      Home Exercise Plan   Plans to continue exercise at  [employer] (comment) water aerobics at [place] 2 additional days to program exercise sessions.    Initial Home Exercises Provided  09/29/17       Nutrition:  Target Goals: Understanding of nutrition guidelines, daily intake of sodium <1574m, cholesterol <2046m calories 30% from fat and 7% or less from saturated fats, daily to have 5 or more servings of fruits and vegetables.  Biometrics: Pre Biometrics - 12/02/17  1101      Pre Biometrics   Height  5' 7.4" (1.712 m)    Weight  256 lb 12.8 oz (116.5 kg)    Waist Circumference  45 inches    Hip Circumference  46 inches    Waist to Hip Ratio  0.98 %    BMI (Calculated)  39.74        Nutrition Therapy Plan and Nutrition Goals: Nutrition Therapy & Goals - 11/10/17 1052      Nutrition Therapy   Diet  TLC, Heart Healthy    Protein (specify units)  9oz    Fiber  30 grams    Whole Grain Foods  3 servings    Saturated Fats  16 max. grams    Fruits and Vegetables  5 servings/day 8 optimal    Sodium  2000 grams      Personal Nutrition Goals   Nutrition Goal  Look for ways to include additional servings of vegetables in your daily diet. Steaming, sauteeing, grilling, and roasting are all great cooking methods for them.    Personal Goal #2  Pay closer attention to portions at meal times, particularly starchy foods. Utilize the plate method as discussed.    Personal Goal #3  Continue to look for and join local organizations that support your health goals, such as [employer], the community pool, and your local doctor/ nutritonist team  Intervention Plan   Intervention  Prescribe, educate and counsel regarding individualized specific dietary modifications aiming towards targeted core components such as weight, hypertension, lipid management, diabetes, heart failure and other comorbidities.    Expected Outcomes  Short Term Goal: Understand basic principles of dietary content, such as calories, fat, sodium, cholesterol and nutrients.;Short Term Goal: A plan has been developed with personal nutrition goals set during dietitian appointment.;Long Term Goal: Adherence to prescribed nutrition plan.       Nutrition Assessments: Nutrition Assessments - 12/02/17 1124      MEDFICTS Scores   Pre Score  67       Nutrition Goals Re-Evaluation: Nutrition Goals Re-Evaluation    [place] Name 11/10/17 1107             Goals   Nutrition Goal  Look for ways  to include additional servings of vegetables in your daily diet. Steaming, sauteeing, grilling, and roasting are all great cooking methods for them.       Comment  He has been trying to include more vegetables in his diet, which has traditionally been starch-heavy.       Expected Outcome  He will include servings of vegetables daily when planning meals         Personal Goal #2 Re-Evaluation   Personal Goal #2  Pay closer attention to portion sizes at meal times, particularly starchy foods. Utilize the plate method as discussed         Personal Goal #3 Re-Evaluation   Personal Goal #3  Continue to look for and join local organizations that support your health goals, such as [employer], the gym, the community pool and your local doctor/ nutritionist team          Nutrition Goals Discharge (Final Nutrition Goals Re-Evaluation): Nutrition Goals Re-Evaluation - 11/10/17 1107      Goals   Nutrition Goal  Look for ways to include additional servings of vegetables in your daily diet. Steaming, sauteeing, grilling, and roasting are all great cooking methods for them.    Comment  He has been trying to include more vegetables in his diet, which has traditionally been starch-heavy.    Expected Outcome  He will include servings of vegetables daily when planning meals      Personal Goal #2 Re-Evaluation   Personal Goal #2  Pay closer attention to portion sizes at meal times, particularly starchy foods. Utilize the plate method as discussed      Personal Goal #3 Re-Evaluation   Personal Goal #3  Continue to look for and join local organizations that support your health goals, such as [employer], the gym, the community pool and your local doctor/ nutritionist team       Psychosocial: Target Goals: Acknowledge presence or absence of significant depression and/or stress, maximize coping skills, provide positive support system. Participant is able to verbalize types and ability to use techniques and skills needed for  reducing stress and depression.   Initial Review & Psychosocial Screening:   Quality of Life Scores:  Quality of Life - 12/02/17 1116      Quality of Life Scores   Health/Function Pre  7.8 %    Socioeconomic Pre  16.83 %    Psych/Spiritual Pre  19.64 %    Family Pre  15.75 %    GLOBAL Pre  13.08 %      Scores of 19 and below usually indicate a poorer quality of life in these areas.  A difference of  2-3 points  is a clinically meaningful difference.  A difference of 2-3 points in the total score of the Quality of Life Index has been associated with significant improvement in overall quality of life, self-image, physical symptoms, and general health in studies assessing change in quality of life.  PHQ-9: Recent Review Flowsheet Data    Depression screen [facility] 2/9 12/02/2017 10/06/2017 09/23/2017 09/20/2017 09/20/2017   Decreased Interest 2 2 - 3 0   Down, Depressed, Hopeless '2 1 1 2 ' 0   PHQ - 2 Score '4 3 1 5 ' 0   Altered sleeping 1 2 - 2 -   Tired, decreased energy 1 2 - 2 -   Change in appetite 1 2 - 3 -   Feeling bad or failure about yourself  1 2 - 2 -   Trouble concentrating 1 1 - 1 -   Moving slowly or fidgety/restless 1 1 - 1 -   Suicidal thoughts 0 1 0 1  -   PHQ-9 Score 10 14 - 17 -   Difficult doing work/chores Somewhat difficult - - Very difficult -     Interpretation of Total Score  Total Score Depression Severity:  1-4 = Minimal depression, 5-9 = Mild depression, 10-14 = Moderate depression, 15-19 = Moderately severe depression, 20-27 = Severe depression   Psychosocial Evaluation and Intervention:   Psychosocial Re-Evaluation: Psychosocial Re-Evaluation    [place] Name 11/08/17 1109             Psychosocial Re-Evaluation   Current issues with  History of Depression;Current Psychotropic Meds       Comments  Counselor follow up with Asencion Partridge today reporting he has had several cardiac tests over the past month; including a heart cath and is feeling more positive about his  condition now.  He is making great progress in this program losing weight; eating more healthy; on fewer medications; and sleeping almost 6-8 hours vs. 4/night previously.  Pascal reports no panic symptoms recently and minimal family conflict in the past month.  He is increasing his levels here as he is feeling increased stamina and strength.  Abdulmalik has not joined [employer] yet, but it is still on his list to consider in the future for support for his weight loss journey.  Counselor commended Education officer, community for all of his progress made since coming into this program.  He admits he is "very proud of (him)self" and the progress he has made in such a short time.  Counselor will continue to provide support and follow up with [facility].       Expected Outcomes  Short:  Kaya will continue to eat a more heart healthy diet for continued weight loss and meet with the nutritionist monthly that he is seeing.  Long:  Addam will continue to exercise consistently to breathe better and manage his stressors more positively.         Interventions  Stress management education       Continue Psychosocial Services   Follow up required by staff          Psychosocial Discharge (Final Psychosocial Re-Evaluation): Psychosocial Re-Evaluation - 11/08/17 1109      Psychosocial Re-Evaluation   Current issues with  History of Depression;Current Psychotropic Meds    Comments  Counselor follow up with Asencion Partridge today reporting he has had several cardiac tests over the past month; including a heart cath and is feeling more positive about his condition now.  He is making great progress in this program losing weight;  eating more healthy; on fewer medications; and sleeping almost 6-8 hours vs. 4/night previously.  Johnwilliam reports no panic symptoms recently and minimal family conflict in the past month.  He is increasing his levels here as he is feeling increased stamina and strength.  Geovani has not joined [employer] yet, but it is still  on his list to consider in the future for support for his weight loss journey.  Counselor commended Education officer, community for all of his progress made since coming into this program.  He admits he is "very proud of (him)self" and the progress he has made in such a short time.  Counselor will continue to provide support and follow up with [facility].    Expected Outcomes  Short:  Mykal will continue to eat a more heart healthy diet for continued weight loss and meet with the nutritionist monthly that he is seeing.  Long:  Curtis will continue to exercise consistently to breathe better and manage his stressors more positively.      Interventions  Stress management education    Continue Psychosocial Services   Follow up required by staff       Vocational Rehabilitation: Provide vocational rehab assistance to qualifying candidates.   Vocational Rehab Evaluation & Intervention:   Education: Education Goals: Education classes will be provided on a variety of topics geared toward better understanding of heart health and risk factor modification. Participant will state understanding/return demonstration of topics presented as noted by education test scores.  Learning Barriers/Preferences: Learning Barriers/Preferences - 12/02/17 1109      Learning Barriers/Preferences   Learning Barriers  Hearing;Sight    Learning Preferences  Skilled Demonstration;Written Material;Computer/Internet;Group Instruction;Verbal Instruction       Education Topics:  AED/CPR: - Group verbal and written instruction with the use of models to demonstrate the basic use of the AED with the basic ABC's of resuscitation.   General Nutrition Guidelines/Fats and Fiber: -Group instruction provided by verbal, written material, models and posters to present the general guidelines for heart healthy nutrition. Gives an explanation and review of dietary fats and fiber.   Cardiac Rehab from 12/16/2017 in [facility] Cardiac and Pulmonary Rehab  Date   09/27/17  Educator  CR  Instruction Review Code  1- Verbalizes Understanding      Controlling Sodium/Reading Food Labels: -Group verbal and written material supporting the discussion of sodium use in heart healthy nutrition. Review and explanation with models, verbal and written materials for utilization of the food label.   Exercise Physiology & General Exercise Guidelines: - Group verbal and written instruction with models to review the exercise physiology of the cardiovascular system and associated critical values. Provides general exercise guidelines with specific guidelines to those with heart or lung disease.    Cardiac Rehab from 10/27/2016 in [facility] Rehab  Date  09/22/16  Educator  [facility]  Instruction Review Code (retired)  2- meets goals/outcomes      Aerobic Exercise & Resistance Training: - Gives group verbal and written instruction on the various components of exercise. Focuses on aerobic and resistive training programs and the benefits of this training and how to safely progress through these programs..   Flexibility, Balance, Mind/Body Relaxation: Provides group verbal/written instruction on the benefits of flexibility and balance training, including mind/body exercise modes such as yoga, pilates and tai chi.  Demonstration and skill practice provided.   Cardiac Rehab from 12/16/2017 in [facility] Cardiac and Pulmonary Rehab  Date  11/10/17  Educator  AS  Instruction Review  Code  1- Verbalizes Understanding      Stress and Anxiety: - Provides group verbal and written instruction about the health risks of elevated stress and causes of high stress.  Discuss the correlation between heart/lung disease and anxiety and treatment options. Review healthy ways to manage with stress and anxiety.   Cardiac Rehab from 10/27/2016 in [facility] Cardiac and Pulmonary Rehab  Date  08/13/16  Educator  TS  Instruction Review Code (retired)  2- meets goals/outcomes       Depression: - Provides group verbal and written instruction on the correlation between heart/lung disease and depressed mood, treatment options, and the stigmas associated with seeking treatment.   Cardiac Rehab from 12/16/2017 in [facility] Cardiac and Pulmonary Rehab  Date  11/17/17  Educator  [facility]  Instruction Review Code  1- Verbalizes Understanding      Anatomy & Physiology of the Heart: - Group verbal and written instruction and models provide basic cardiac anatomy and physiology, with the coronary electrical and arterial systems. Review of Valvular disease and Heart Failure   Cardiac Rehab from 10/27/2016 in [facility] Cardiac and Pulmonary Rehab  Date  10/08/16  Educator  SB  Instruction Review Code (retired)  2- meets goals/outcomes      Cardiac Procedures: - Group verbal and written instruction to review commonly prescribed medications for heart disease. Reviews the medication, class of the drug, and side effects. Includes the steps to properly store meds and maintain the prescription regimen. (beta blockers and nitrates)   Cardiac Rehab from 10/27/2016 in [facility] Cardiac and Pulmonary Rehab  Date  10/13/16  Educator  SB  Instruction Review Code (retired)  2- meets goals/outcomes      Cardiac Medications I: - Group verbal and written instruction to review commonly prescribed medications for heart disease. Reviews the medication, class of the drug, and side effects. Includes the steps to properly store meds and maintain the prescription regimen.   Cardiac Rehab from 12/16/2017 in [facility] Cardiac and Pulmonary Rehab  Date  11/12/17  Educator  [facility]  Instruction Review Code  1- Verbalizes Understanding      Cardiac Medications II: -Group verbal and written instruction to review commonly prescribed medications for heart disease. Reviews the medication, class of the drug, and side effects. (all other drug classes)   Cardiac Rehab from 12/16/2017 in [facility] Cardiac and Pulmonary Rehab  Date   12/16/17  Educator  CE  Instruction Review Code  1- Verbalizes Understanding       Go Sex-Intimacy & Heart Disease, Get SMART - Goal Setting: - Group verbal and written instruction through game format to discuss heart disease and the return to sexual intimacy. Provides group verbal and written material to discuss and apply goal setting through the application of the S.M.A.R.T. Method.   Cardiac Rehab from 10/27/2016 in [facility] Cardiac and Pulmonary Rehab  Date  10/13/16  Educator  SB  Instruction Review Code (retired)  2- meets goals/outcomes      Other Matters of the Heart: - Provides group verbal, written materials and models to describe Stable Angina and Peripheral Artery. Includes description of the disease process and treatment options available to the cardiac patient.   Cardiac Rehab from 10/27/2016 in  [facility] Cardiac and Pulmonary Rehab  Date  10/08/16  Educator  SB  Instruction Review Code (retired)  2- meets goals/outcomes      Exercise & Equipment Safety: - Individual verbal instruction and demonstration of equipment use and safety with use of the equipment.  Cardiac Rehab from 12/16/2017 in [facility] Cardiac and Pulmonary Rehab  Date  09/20/17  Educator  [facility]  Instruction Review Code  1- Verbalizes Understanding      Infection Prevention: - Provides verbal and written material to individual with discussion of infection control including proper hand washing and proper equipment cleaning during exercise session.   Cardiac Rehab from 12/16/2017 in [facility] Cardiac and Pulmonary Rehab  Date  09/20/17  Educator  [facility]  Instruction Review Code  1- Verbalizes Understanding      Falls Prevention: - Provides verbal and written material to individual with discussion of falls prevention and safety.   Cardiac Rehab from 12/16/2017 in [facility] Cardiac and Pulmonary Rehab  Date  09/20/17  Educator  [facility]  Instruction Review Code  1- Verbalizes Understanding      Diabetes: - Individual verbal and  written instruction to review signs/symptoms of diabetes, desired ranges of glucose level fasting, after meals and with exercise. Acknowledge that pre and post exercise glucose checks will be done for 3 sessions at entry of program.   Know Your Numbers and Risk Factors: -Group verbal and written instruction about important numbers in your health.  Discussion of what are risk factors and how they play a role in the disease process.  Review of Cholesterol, Blood Pressure, Diabetes, and BMI and the role they play in your overall health.   Cardiac Rehab from 12/16/2017 in [facility] Cardiac and Pulmonary Rehab  Date  12/16/17  Educator  CE  Instruction Review Code  1- Verbalizes Understanding      Sleep Hygiene: -Provides group verbal and written instruction about how sleep can affect your health.  Define sleep hygiene, discuss sleep cycles and impact of sleep habits. Review good sleep hygiene tips.    Other: -Provides group and verbal instruction on various topics (see comments)   Knowledge Questionnaire Score: Knowledge Questionnaire Score - 12/02/17 1122      Knowledge Questionnaire Score   Pre Score  24/26 Focus for education Angina and Nutrition       Core Components/Risk Factors/Patient Goals at Admission: Personal Goals and Risk Factors at Admission - 12/02/17 1102      Core Components/Risk Factors/Patient Goals on Admission    Weight Management  Yes;Obesity;Weight Loss    Intervention  Weight Management: Develop a combined nutrition and exercise program designed to reach desired caloric intake, while maintaining appropriate intake of nutrient and fiber, sodium and fats, and appropriate energy expenditure required for the weight goal.;Weight Management: Provide education and appropriate resources to help participant work on and attain dietary goals.;Weight Management/Obesity: Establish reasonable short term and long term weight goals.;Obesity: Provide education and appropriate resources  to help participant work on and attain dietary goals.    Admit Weight  256 lb 12.8 oz (116.5 kg)    Goal Weight: Short Term  252 lb (114.3 kg)    Goal Weight: Long Term  180 lb (81.6 kg)    Expected Outcomes  Short Term: Continue to assess and modify interventions until short term weight is achieved;Long Term: Adherence to nutrition and physical activity/exercise program aimed toward attainment of established weight goal;Weight Loss: Understanding of general recommendations for a balanced deficit meal plan, which promotes 1-2 lb weight loss per week and includes a negative energy balance of [phone] kcal/d;Understanding recommendations for meals to include 15-35% energy as protein, 25-35% energy from fat, 35-60% energy from carbohydrates, less than 265m of dietary cholesterol, 20-35 gm of total fiber daily;Understanding of distribution of calorie intake  throughout the day with the consumption of 4-5 meals/snacks    Improve shortness of breath with ADL's  Yes    Intervention  Provide education, individualized exercise plan and daily activity instruction to help decrease symptoms of SOB with activities of daily living.    Expected Outcomes  Short Term: Improve cardiorespiratory fitness to achieve a reduction of symptoms when performing ADLs;Long Term: Be able to perform more ADLs without symptoms or delay the onset of symptoms    Heart Failure  Yes    Intervention  Provide a combined exercise and nutrition program that is supplemented with education, support and counseling about heart failure. Directed toward relieving symptoms such as shortness of breath, decreased exercise tolerance, and extremity edema.    Expected Outcomes  Improve functional capacity of life;Short term: Attendance in program 2-3 days a week with increased exercise capacity. Reported lower sodium intake. Reported increased fruit and vegetable intake. Reports medication compliance.;Short term: Daily weights obtained and reported for  increase. Utilizing diuretic protocols set by physician.;Long term: Adoption of self-care skills and reduction of barriers for early signs and symptoms recognition and intervention leading to self-care maintenance.    Hypertension  Yes    Intervention  Provide education on lifestyle modifcations including regular physical activity/exercise, weight management, moderate sodium restriction and increased consumption of fresh fruit, vegetables, and low fat dairy, alcohol moderation, and smoking cessation.;Monitor prescription use compliance.    Expected Outcomes  Short Term: Continued assessment and intervention until BP is < 140/31m HG in hypertensive participants. < 130/875mHG in hypertensive participants with diabetes, heart failure or chronic kidney disease.;Long Term: Maintenance of blood pressure at goal levels.    Lipids  Yes    Intervention  Provide education and support for participant on nutrition & aerobic/resistive exercise along with prescribed medications to achieve LDL <7039mHDL >79m61m  Expected Outcomes  Short Term: Participant states understanding of desired cholesterol values and is compliant with medications prescribed. Participant is following exercise prescription and nutrition guidelines.;Long Term: Cholesterol controlled with medications as prescribed, with individualized exercise RX and with personalized nutrition plan. Value goals: LDL < 70mg71mL > 40 mg.    Stress  Yes    Intervention  Offer individual and/or small group education and counseling on adjustment to heart disease, stress management and health-related lifestyle change. Teach and support self-help strategies.;Refer participants experiencing significant psychosocial distress to appropriate mental health specialists for further evaluation and treatment. When possible, include family members and significant others in education/counseling sessions.    Expected Outcomes  Short Term: Participant demonstrates changes in  health-related behavior, relaxation and other stress management skills, ability to obtain effective social support, and compliance with psychotropic medications if prescribed.;Long Term: Emotional wellbeing is indicated by absence of clinically significant psychosocial distress or social isolation.       Core Components/Risk Factors/Patient Goals Review:    Core Components/Risk Factors/Patient Goals at Discharge (Final Review):    ITP Comments: ITP Comments    Row Name 10/28/17 1058 11/05/17 1040 11/08/17 0822 11/17/17 1147 11/22/17 1641   ITP Comments  Pt had recent cath and has a referral to Cardiac Rehab.  Staff will follow up with cardiologist   Patient returned today for Exercise. Will continue to monitor and do goals once he has regular attendance.   30 day review completed. ITP sent to Dr. Mark Emily Filbertctor of [facility] with ITP unless changes are made by physician   Pt reported "just having trouble breathing". O2 sats  in high 90's, no difficulty talking with exercise. Stopped exercise after completing 2nd machine and sat down. When questioned he stated "I just don't feel good". While I was on the TM, I began sweating profusely so I stopped. It was better on the NuStep. I started taking my nebulizer over the weekend." A check of current vial in use revelaled expiration of 11/2016. Pt informed to discontinue use of this vial. Exercise pO2 98, hr 88 RPE 11, RPD 2. Pt states he had cardiac cath 2 weeks ago. Pt refused to go to ER "my dog is home alone". Call to Dr Alphonsus Sias cardiology-nurse stated that there were no physicians in the office today. Pt said his PCP, Dr Guadelupe Sabin, works next door to him and is in the office today. With VS 152/80, pO2 99, HR 88 and "feeling much better", pt left to drive straight to Dr Rexene Alberts for f/up. Dr Rexene Alberts office notified, with VS and s/s update. Pt given number and instructed to call gym/office after seeing MD.   Asencion Partridge called to let us  know that he was back in the emergency room.  He saw his pulmonologist today and that they think it nerve damage that is causing several of his problems.  He will be getting that theory tested.  He also has a pacer check and blood work with cardiology and get sonogram of carotids.  Dr. Rockey Situ wants [place] on heart monitor for exercise. We will discharge Jahfari from Pulmonary Rehab and get him back into Cardiac Rehab for him to wear the monitor.  He is going to stop by this week to pick up paperwork and set up his start date for Cardiac.    [place] Name 12/06/2017 1049 12/14/17 1428 12/22/17 0604       ITP Comments  Haseeb started in Cardiac Rehab today.  Documentation for diagnosis can be found in [facility] enounter from 5/21 and admission from 5/15.  New Cardaic ITP created and sent to Dr. Emily Filbert, Medical Director for review and signature.   Called to check on pt as he has been out since Dec 07, 2022.  He had a death in the family and needed to go to [place]. He also took time to visit with his family. 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  He has also been sick.  He is hoping to try to come in on Thursday.   30 day review. Continue with ITP unless directed changes per Medical Director review        Comments:

## 2017-12-23 ENCOUNTER — Encounter: Payer: Medicare Other | Admitting: *Deleted

## 2017-12-23 ENCOUNTER — Telehealth: Payer: Self-pay | Admitting: Cardiovascular Disease

## 2017-12-23 DIAGNOSIS — I25118 Atherosclerotic heart disease of native coronary artery with other forms of angina pectoris: Secondary | ICD-10-CM | POA: Diagnosis not present

## 2017-12-23 DIAGNOSIS — I5022 Chronic systolic (congestive) heart failure: Secondary | ICD-10-CM

## 2017-12-23 NOTE — Telephone Encounter (Signed)
Spoke with Harriett SineNancy at [facility]  Let her know per Dr. Ethelene HalGollans last note  Lasix is to be discontinued and Torsemide is being started.

## 2017-12-23 NOTE — Progress Notes (Signed)
Daily Session Note  Patient Details  Name: Kevin Pearson MRN: [id number] Date of Birth: January 01, 1951 Referring Provider:     Cardiac Rehab from 12/02/2017 in [facility] Cardiac and Pulmonary Rehab  Referring Provider  Ida Rogue MD      Encounter Date: 12/23/2017  Check In: Session Check In - 12/23/17 0829      Check-In   Location  [facility]-Cardiac & Pulmonary Rehab    Staff Present  Gerlene Burdock, RN, BSN;Caliann Leckrone Luan Pulling, MA, RCEP, CCRP, Exercise Physiologist;Amanda Oletta Darter, [place], ACSM CEP, Exercise Physiologist    Supervising physician immediately available to respond to emergencies  See telemetry face sheet for immediately available ER MD    Medication changes reported      No    Fall or balance concerns reported     No    Warm-up and Cool-down  Performed on first and last piece of equipment    Resistance Training Performed  Yes    VAD Patient?  No      Pain Assessment   Currently in Pain?  No/denies          Social History   Tobacco Use  Smoking Status Former Smoker  . Packs/day: 1.00  . Years: 40.00  . Pack years: 40.00  . Types: Cigarettes  . Last attempt to quit: 08/2015  . Years since quitting: 2.3  Smokeless Tobacco Never Used    Goals Met:  Independence with exercise equipment Exercise tolerated well No report of cardiac concerns or symptoms Strength training completed today  Goals Unmet:  Not Applicable  Comments: Pt able to follow exercise prescription today without complaint.  Will continue to monitor for progression. Reviewed home exercise again to update for cardiac sessions.    Dr. Emily Filbert is Medical Director for [place] and [name].

## 2017-12-23 NOTE — Telephone Encounter (Signed)
Pharmacist has a question regarding Lasix and Torsemide. Please call and advise.

## 2017-12-24 ENCOUNTER — Ambulatory Visit: Payer: Medicare Other | Admitting: Cardiovascular Disease

## 2017-12-27 ENCOUNTER — Ambulatory Visit: Payer: Medicare Other | Attending: Family | Admitting: Family

## 2017-12-27 ENCOUNTER — Encounter: Payer: Self-pay | Admitting: Family

## 2017-12-27 VITALS — BP 116/77 | HR 70 | Resp 18 | Ht 68.0 in | Wt 259.5 lb

## 2017-12-27 DIAGNOSIS — Z955 Presence of coronary angioplasty implant and graft: Secondary | ICD-10-CM | POA: Insufficient documentation

## 2017-12-27 DIAGNOSIS — K219 Gastro-esophageal reflux disease without esophagitis: Secondary | ICD-10-CM | POA: Insufficient documentation

## 2017-12-27 DIAGNOSIS — Z79899 Other long term (current) drug therapy: Secondary | ICD-10-CM | POA: Insufficient documentation

## 2017-12-27 DIAGNOSIS — Z7901 Long term (current) use of anticoagulants: Secondary | ICD-10-CM | POA: Diagnosis not present

## 2017-12-27 DIAGNOSIS — I951 Orthostatic hypotension: Secondary | ICD-10-CM | POA: Insufficient documentation

## 2017-12-27 DIAGNOSIS — F419 Anxiety disorder, unspecified: Secondary | ICD-10-CM | POA: Diagnosis not present

## 2017-12-27 DIAGNOSIS — I890 Lymphedema, not elsewhere classified: Secondary | ICD-10-CM | POA: Insufficient documentation

## 2017-12-27 DIAGNOSIS — Z87891 Personal history of nicotine dependence: Secondary | ICD-10-CM | POA: Diagnosis not present

## 2017-12-27 DIAGNOSIS — G4733 Obstructive sleep apnea (adult) (pediatric): Secondary | ICD-10-CM

## 2017-12-27 DIAGNOSIS — I4891 Unspecified atrial fibrillation: Secondary | ICD-10-CM | POA: Insufficient documentation

## 2017-12-27 DIAGNOSIS — Z7902 Long term (current) use of antithrombotics/antiplatelets: Secondary | ICD-10-CM | POA: Insufficient documentation

## 2017-12-27 DIAGNOSIS — I5022 Chronic systolic (congestive) heart failure: Secondary | ICD-10-CM | POA: Diagnosis not present

## 2017-12-27 DIAGNOSIS — Z7951 Long term (current) use of inhaled steroids: Secondary | ICD-10-CM | POA: Diagnosis not present

## 2017-12-27 DIAGNOSIS — I110 Hypertensive heart disease with heart failure: Secondary | ICD-10-CM | POA: Diagnosis not present

## 2017-12-27 DIAGNOSIS — E785 Hyperlipidemia, unspecified: Secondary | ICD-10-CM | POA: Insufficient documentation

## 2017-12-27 DIAGNOSIS — K759 Inflammatory liver disease, unspecified: Secondary | ICD-10-CM | POA: Insufficient documentation

## 2017-12-27 DIAGNOSIS — I252 Old myocardial infarction: Secondary | ICD-10-CM | POA: Insufficient documentation

## 2017-12-27 DIAGNOSIS — R0602 Shortness of breath: Secondary | ICD-10-CM | POA: Diagnosis present

## 2017-12-27 DIAGNOSIS — F329 Major depressive disorder, single episode, unspecified: Secondary | ICD-10-CM | POA: Insufficient documentation

## 2017-12-27 DIAGNOSIS — J449 Chronic obstructive pulmonary disease, unspecified: Secondary | ICD-10-CM | POA: Insufficient documentation

## 2017-12-27 DIAGNOSIS — Z9981 Dependence on supplemental oxygen: Secondary | ICD-10-CM | POA: Insufficient documentation

## 2017-12-27 DIAGNOSIS — Z885 Allergy status to narcotic agent status: Secondary | ICD-10-CM | POA: Insufficient documentation

## 2017-12-27 DIAGNOSIS — I950 Idiopathic hypotension: Secondary | ICD-10-CM

## 2017-12-27 DIAGNOSIS — F32A Depression, unspecified: Secondary | ICD-10-CM

## 2017-12-27 NOTE — Progress Notes (Signed)
Patient ID: Kevin Pearson, male    DOB: Oct 06, 1950, 67 y.o.   MRN: [id number]  HPI  Kevin Pearson is a 67 y/o male with a history of GERD, anxiety, atrial fibrillation, depression, COPD (chronic O2), TBI, MI (multiple stents), hepatitis, hyperlipidemia, HTN, obstructive sleep apnea (without CPAP), remote tobacco/drug/alcohol use and chronic heart failure.   Echo report from 10/21/17 reviewed and showed an EF of 45-50% along with mild Kevin and an elevated PA pressure of 34 mm Hg. Reviewed echo report on 02/14/16 which showed an EF of 35%.   Cardiac catheterization done 10/27/17   Was in the ED 11/17/17 due to chest pain where he was treated and released.  Admitted 08/10/17 due to confusion and UTI. Urology consult obtained and antibiotics were started. He was discharged after 7 days to SNF.          He presents today for a follow-up visit with a chief complaint of minimal shortness of breath upon moderate exertion. He describes this as chronic in nature although he does feel like his symptoms are improving some. He has associated fatigue, pedal edema, light-headedness, numbness in feet and anxiety along with this. He denies any difficulty sleeping, abdominal distention, palpitations, chest pain, cough or weight gain. Has noticed an increase in his pedal edema as he hasn't worn his TED hose in the last 2 days.   Past Medical History:  Diagnosis Date  . Acid reflux   . Anxiety   . Arthritis   . Atrial fibrillation (HCC)   . CHF (congestive heart failure) (HCC)   . Chronic orthostatic hypotension   . Clotting disorder (HCC)   . COPD (chronic obstructive pulmonary disease) (HCC)   . Depression   . Elevated PSA   . Heart attack (HCC)   . Heart disease   . Heart failure (HCC)   . Hepatitis   . High cholesterol   . Hypertension   . Sleep apnea   . TBI (traumatic brain injury) (HCC)   . Urinary retention    Past Surgical History:  Procedure Laterality Date  . BLADDER SURGERY    . cardiac stents    .  CYSTOSCOPY WITH URETHRAL DILATATION N/A 06/25/2016   Procedure: CYSTOSCOPY WITH URETHRAL DILATATION;  Surgeon: Vanna Scotland, MD;  Location: [facility] ORS;  Service: Urology;  Laterality: N/A;  . EP IMPLANTABLE DEVICE     [employer] Jude BiV-ICD  . GREEN LIGHT LASER TURP (TRANSURETHRAL RESECTION OF PROSTATE  2016   done in [place]   . LEFT HEART CATH AND CORONARY ANGIOGRAPHY Left 10/27/2017   Procedure: LEFT HEART CATH AND CORONARY ANGIOGRAPHY;  Surgeon: Antonieta Iba, MD;  Location: [facility] INVASIVE CV [facility];  Service: Cardiovascular;  Laterality: Left;   Family History  Problem Relation Age of Onset  . Other Father        Cerebral hemorrhage  . Kidney cancer Neg Hx   . Kidney disease Neg Hx   . Prostate cancer Neg Hx    Social History   Tobacco Use  . Smoking status: Former Smoker    Packs/day: 1.00    Years: 40.00    Pack years: 40.00    Types: Cigarettes    Last attempt to quit: 08/2015    Years since quitting: 2.3  . Smokeless tobacco: Never Used  Substance Use Topics  . Alcohol use: Not Currently   Allergies  Allergen Reactions  . Mangifera Indica Anaphylaxis  . Other Anaphylaxis, Itching and Other (See Comments)    Mango  skin  . Codeine Nausea And Vomiting   Prior to Admission medications   Medication Sig Start Date End Date Taking? Authorizing Provider  acetaminophen (TYLENOL) 325 MG tablet Take 650 mg by mouth every 6 (six) hours as needed.    Yes [provider]  albuterol (PROVENTIL HFA;VENTOLIN HFA) 108 (90 Base) MCG/ACT inhaler Inhale 2 puffs into the lungs every 6 (six) hours as needed for wheezing or shortness of breath. 11/17/17  Yes Willy Eddyobinson, Patrick, MD  budesonide (PULMICORT) 0.5 MG/2ML nebulizer solution Inhale 2 mLs into the lungs daily as needed.  09/26/16  Yes [provider]  busPIRone (BUSPAR) 10 MG tablet Take 10 mg by mouth 3 (three) times daily.   Yes [provider]  clopidogrel (PLAVIX) 75 MG tablet Take 75 mg by mouth daily.   Yes  [provider]  divalproex (DEPAKOTE) 500 MG DR tablet Take 500 mg by mouth 2 (two) times daily.  07/30/17  Yes [provider]  Elastic Bandages & Supports (T.E.D. BELOW KNEE/X-LARGE) MISC 1 application by Does not apply route daily. 11/17/17  Yes Willy Eddyobinson, Patrick, MD  gabapentin (NEURONTIN) 300 MG capsule Take 600 mg by mouth 2 (two) times daily.    Yes [provider]  ipratropium-albuterol (DUONEB) 0.5-2.5 (3) MG/3ML SOLN Take 3 mLs by nebulization every 6 (six) hours as needed (wheezing/shortness of breath).   Yes [provider]  isosorbide mononitrate (IMDUR) 30 MG 24 hr tablet Take 30 mg by mouth daily.   Yes [provider]  losartan (COZAAR) 25 MG tablet Take 1 tablet (25 mg total) by mouth daily. 11/04/17  Yes Gollan, Tollie Pizzaimothy J, MD  Melatonin 5 MG TABS Take 5 mg by mouth at bedtime.   Yes [provider]  metoprolol succinate (TOPROL XL) 25 MG 24 hr tablet Take 1 tablet (25 mg total) by mouth daily. 12/21/17  Yes Gollan, Tollie Pizzaimothy J, MD  nitroGLYCERIN (NITROSTAT) 0.4 MG SL tablet Place 0.4 mg under the tongue every 5 (five) minutes as needed for chest pain.   Yes [provider]  omeprazole (PRILOSEC) 40 MG capsule Take 40 mg by mouth daily.   Yes [provider]  rivaroxaban (XARELTO) 20 MG TABS tablet Take 20 mg by mouth at bedtime.   Yes [provider]  rosuvastatin (CRESTOR) 20 MG tablet Take 20 mg by mouth daily.   Yes [provider]  senna (SENOKOT) 8.6 MG tablet Take 2 tablets by mouth as needed.    Yes [provider]  spironolactone (ALDACTONE) 25 MG tablet Take 0.5 tablets (12.5 mg total) by mouth daily. 11/23/17  Yes Duke SalviaKlein, Steven C, MD  tamsulosin (FLOMAX) 0.4 MG CAPS capsule TAKE ONE CAPSULE BY MOUTH EVERY DAY AFTER SUPPER 03/23/17  Yes McGowan, Carollee HerterShannon A, PA-C  torsemide (DEMADEX) 20 MG tablet 2 tablets (40 mg) in the AM and 1 tablet (20 mg) at lunch. 12/21/17  Yes Antonieta IbaGollan, Timothy J,  MD  traZODone (DESYREL) 50 MG tablet Take 100 mg by mouth at bedtime.   Yes [provider]  vortioxetine HBr (TRINTELLIX) 5 MG TABS Take 5 mg by mouth daily.    Yes [provider]    Review of Systems  Constitutional: Positive for fatigue. Negative for appetite change.  HENT: Negative for congestion, postnasal drip and sore throat.   Eyes: Negative.   Respiratory: Positive for shortness of breath. Negative for cough and chest tightness.   Cardiovascular: Positive for leg swelling (better when wearing TED hose). Negative for  chest pain and palpitations.  Gastrointestinal: Negative for abdominal distention and abdominal pain.  Endocrine: Negative.   Genitourinary: Negative.   Musculoskeletal: Positive for arthralgias (left shoulder pain). Negative for back pain.  Skin: Negative.   Allergic/Immunologic: Negative.   Neurological: Positive for light-headedness (when changing positions too quickly) and numbness (bilateral feet). Negative for dizziness and tremors.       Memory issues   Hematological: Negative for adenopathy. Does not bruise/bleed easily.  Psychiatric/Behavioral: Positive for dysphoric mood. Negative for sleep disturbance (sleeping on 2 pillows; wearing oxygen at 2L at bedtime and PRN during the day) and suicidal ideas. The patient is nervous/anxious.    Vitals:   12/27/17 1117  BP: 116/77  Pulse: 70  Resp: 18  SpO2: 100%  Weight: 259 lb 8 oz (117.7 kg)  Height: 5\' 8"  (1.727 m)   Wt Readings from Last 3 Encounters:  12/27/17 259 lb 8 oz (117.7 kg)  12/21/17 257 lb 8 oz (116.8 kg)  12/02/17 256 lb 12.8 oz (116.5 kg)   Lab Results  Component Value Date   CREATININE 1.29 (H) 11/17/2017   CREATININE 1.19 10/21/2017   CREATININE 1.73 (H) 09/23/2017    Physical Exam  Constitutional: He is oriented to person, place, and time. He appears well-developed and well-nourished.  HENT:  Head: Normocephalic and atraumatic.  Neck: Normal range of motion.  Neck supple. No JVD present.  Cardiovascular: Normal rate. An irregular rhythm present.  Pulmonary/Chest: Effort normal. He has no wheezes. He has no rales.  Abdominal: Soft. He exhibits no distension. There is no tenderness.  Musculoskeletal: He exhibits edema (1+ pitting edema in bilateral lower legs ). He exhibits no tenderness.  Neurological: He is alert and oriented to person, place, and time.  Skin: Skin is warm and dry.  Psychiatric: He has a normal mood and affect. His behavior is normal.  Nursing note and vitals reviewed.  Assessment & Plan:  1: Chronic heart failure with reduced ejection fraction- - NYHA class II - mildly fluid overloaded although edema has improved - now taking torsemide - weighing daily. Reminded to weigh every morning, write the weight down and call for an overnight weight gain of >2 pounds or a weekly weight gain of >5 pounds.  - weight stable since he was last here - not adding salt to his food although he says that he tends to eat salty foods because of convenience. Reinforced the importance of closely following a 2000mg  sodium diet.  - saw cardiology Mariah Milling) 12/21/17  2: Hypotension- - BP looks good today; has had hypotension with previous entresto use - saw PCP ([place]) 12/16/17 - BMP from 11/17/17 reviewed and shows sodium 138, potassium 4.0 and GFR 57  3: Obstructive sleep apnea- - wears oxygen at 2L at bedtime and during the day as needed  - saw pulmonologist Meredeth Ide) 07/28/17  4: Anxiety/depression- - continues to live on his own and is managing his medications that are being bubble packed by his pharmacy - follows with psychiatry (Su) on a monthly basis  5: Lymphedema- - stage 2 - has TED hose at home but hasn't worn them the last 2 days because he's been wearing his flip flops. Reports that edema improves when he wears them; he says that he's going to put them on tomorrow - does elevate his legs in a recliner during the day but says that  the swelling persists - not wearing the compression boots as he just doesn't feel like he can find the time  to wear them. Discussed the importance of wearing them at least once a day for an hour with the goal to be wearing them twice daily for an hour each time. Patient commits to wearing them at least once daily  Bubble pack reviewed.  Return here in 6 months or sooner for any questions/problems before then.

## 2017-12-27 NOTE — Patient Instructions (Signed)
Continue weighing daily and call for an overnight weight gain of > 2 pounds or a weekly weight gain of >5 pounds. 

## 2017-12-28 ENCOUNTER — Encounter: Payer: Self-pay | Admitting: Family

## 2017-12-28 DIAGNOSIS — J449 Chronic obstructive pulmonary disease, unspecified: Secondary | ICD-10-CM

## 2017-12-28 DIAGNOSIS — I5022 Chronic systolic (congestive) heart failure: Secondary | ICD-10-CM

## 2017-12-28 NOTE — Progress Notes (Signed)
Daily Session Note  Patient Details  Name: Kevin Pearson MRN: [id number] Date of Birth: [birth year] Referring Provider:     Cardiac Rehab from 12/02/2017 in [facility]monary Re[facility]  Referring Provider  Ida Rogue MD      Encounter Date: 12/28/2017  Check In: Session Check In - 12/28/17 0841      Check-In   Location  [facility]-Cardiac & Pulmonary Rehab    Staff Present  Heath Lark, RN, BSN, CCRP;Jessica Luan Pulling, MA, RCEP, CCRP, Exercise Physiologist;Amanda Oletta Darter, [place], ACSM CEP, Exercise Physiologist    Supervising physician immediately available to respond to emergencies  See telemetry face sheet for immediately available ER MD    Medication changes reported      No    Fall or balance concerns reported     No    Warm-up and Cool-down  Performed on first and last piece of equipment    Resistance Training Performed  Yes    VAD Patient?  No    PAD/SET Patient?  No      Pain Assessment   Currently in Pain?  No/denies    Multiple Pain Sites  No          Social History   Tobacco Use  Smoking Status Former Smoker  . Packs/day: 1.00  . Years: 40.00  . Pack years: 40.00  . Types: Cigarettes  . Last attempt to quit: 08/2015  . Years since quitting: 2.3  Smokeless Tobacco Never Used    Goals Met:  Independence with exercise equipment Exercise tolerated well No report of cardiac concerns or symptoms Strength training completed today  Goals Unmet:  Not Applicable  Comments: Pt able to follow exercise prescription today without complaint.  Will continue to monitor for progression.    Dr. Emily Filbert is Medical Director for [place] and [name].

## 2017-12-30 DIAGNOSIS — I5022 Chronic systolic (congestive) heart failure: Secondary | ICD-10-CM | POA: Diagnosis not present

## 2017-12-30 NOTE — Progress Notes (Signed)
Daily Session Note  Patient Details  Name: Kevin Pearson MRN: [id number] Date of Birth: 20-May-1951 Referring Provider:     Cardiac Rehab from 12/02/2017 in [facility] and [facility] Rehab  Referring Provider  Ida Rogue MD      Encounter Date: 12/30/2017  Check In: Session Check In - 12/30/17 0812      Check-In   Location  [facility]-Cardiac [facility] Pulmonary Re[facility]    Staff Present  Justin Mend RCP,RRT,BSRT;Carroll Enterkin, RN, Levie Heritage, MA, RCEP, CCRP, Exercise Physiologist    Supervising physician immediately available to respond to emergencies  See telemetry face sheet for immediately available ER MD    Medication changes reported      No    Fall or balance concerns reported     No    Warm-up and Cool-down  Performed on first and last piece of equipment    Resistance Training Performed  Yes    VAD Patient?  No    PAD/SET Patient?  No      Pain Assessment   Currently in Pain?  No/denies        Exercise Prescription Changes - 12/29/17 1500      Response to Exercise   Blood Pressure (Admit)  146/78    Blood Pressure (Exercise)  132/60    Blood Pressure (Exit)  112/74    Heart Rate (Admit)  73 bpm    Heart Rate (Exercise)  112 bpm    Heart Rate (Exit)  74 bpm    Perceived Dyspnea (Exercise)  13    Symptoms  none    Duration  Continue with 30 min of aerobic exercise without signs/symptoms of physical distress.    Intensity  THRR unchanged      Progression   Progression  Continue to progress workloads to maintain intensity without signs/symptoms of physical distress.    Average METs  2.25      Resistance Training   Training Prescription  Yes    Weight  4 lbs    Reps  10-15      Interval Training   Interval Training  No      NuStep   Level  6    Minutes  15    METs  3      Arm Ergometer   Level  1    Minutes  15    METs  1.9      Home Exercise Plan   Plans to continue exercise at  [employer] (comment) water aerobics at [place] 2 additional days to program exercise sessions.    Initial Home Exercises Provided  09/29/17       Social History   Tobacco Use  Smoking Status Former Smoker  . Packs/day: 1.00  . Years: 40.00  . Pack years: 40.00  . Types: Cigarettes  . Last attempt to quit: 08/2015  . Years since quitting: 2.4  Smokeless Tobacco Never Used    Goals Met:  Independence with exercise equipment Exercise tolerated well No report of cardiac concerns or symptoms Strength training completed today  Goals Unmet:  Not Applicable  Comments: Pt able to follow exercise prescription today without complaint.  Will continue to monitor for progression.   Dr. Emily Filbert is Medical Director for [place] and [name].

## 2018-01-04 ENCOUNTER — Encounter: Payer: Medicare Other | Attending: Family Medicine

## 2018-01-04 DIAGNOSIS — I5022 Chronic systolic (congestive) heart failure: Secondary | ICD-10-CM | POA: Insufficient documentation

## 2018-01-13 DIAGNOSIS — I5022 Chronic systolic (congestive) heart failure: Secondary | ICD-10-CM | POA: Diagnosis present

## 2018-01-13 DIAGNOSIS — J449 Chronic obstructive pulmonary disease, unspecified: Secondary | ICD-10-CM

## 2018-01-13 NOTE — Progress Notes (Signed)
Daily Session Note  Patient Details  Name: Kevin Pearson MRN: [id number] Date of Birth: Nov 21, 1950 Referring Provider:     Cardiac Rehab from 12/02/2017 in [facility]iac [facility] Re[facility]  Referring Provider  Ida Rogue MD      Encounter Date: 01/13/2018  Check In: Session Check In - 01/13/18 0938      Check-In   Location  [facility]-Cardiac & Pulmonary Rehab    Staff Present  Constance Goltz, RN BSN;Susanne Bice, RN, BSN, CCRP;Amanda Sommer, BA, ACSM CEP, Exercise Physiologist    Supervising physician immediately available to respond to emergencies  See telemetry face sheet for immediately available ER MD    Medication changes reported      No    Fall or balance concerns reported     No    Warm-up and Cool-down  Performed on first and last piece of equipment    Resistance Training Performed  Yes    VAD Patient?  No    PAD/SET Patient?  No      Pain Assessment   Currently in Pain?  No/denies    Multiple Pain Sites  No          Social History   Tobacco Use  Smoking Status Former Smoker  . Packs/day: 1.00  . Years: 40.00  . Pack years: 40.00  . Types: Cigarettes  . Last attempt to quit: 08/2015  . Years since quitting: 2.4  Smokeless Tobacco Never Used    Goals Met:  Independence with exercise equipment Exercise tolerated well No report of cardiac concerns or symptoms Strength training completed today  Goals Unmet:  Not Applicable  Comments: Pt able to follow exercise prescription today without complaint.  Will continue to monitor for progression.    Dr. Emily Filbert is Medical Director for [place] and [name].

## 2018-01-19 ENCOUNTER — Encounter: Payer: Self-pay | Admitting: *Deleted

## 2018-01-19 DIAGNOSIS — I5022 Chronic systolic (congestive) heart failure: Secondary | ICD-10-CM

## 2018-01-19 NOTE — Progress Notes (Signed)
Cardiac Individual Treatment Plan  Patient Details  Name: Kevin Pearson MRN: [id number] Date of Birth: Mar 19, 1951 Referring Provider:     Cardiac Rehab from 12/02/2017 in [facility] [place] [facility]  Referring Provider  Ida Rogue MD      Initial Encounter Date:    Cardiac Rehab from 12/02/2017 in [facility] Cardiac and Pulmonary Rehab  Date  12/02/17      Visit Diagnosis: Heart failure, chronic systolic ([place])  Patient's Home Medications on Admission:  Current Outpatient Medications:  .  acetaminophen (TYLENOL) 325 MG tablet, Take 650 mg by mouth every 6 (six) hours as needed. , Disp: , Rfl:  .  albuterol (PROVENTIL HFA;VENTOLIN HFA) 108 (90 Base) MCG/ACT inhaler, Inhale 2 puffs into the lungs every 6 (six) hours as needed for wheezing or shortness of breath., Disp: 1 Inhaler, Rfl: 2 .  budesonide (PULMICORT) 0.5 MG/2ML nebulizer solution, Inhale 2 mLs into the lungs daily as needed. , Disp: , Rfl: 1 .  busPIRone (BUSPAR) 10 MG tablet, Take 10 mg by mouth 3 (three) times daily., Disp: , Rfl:  .  clopidogrel (PLAVIX) 75 MG tablet, Take 75 mg by mouth daily., Disp: , Rfl:  .  divalproex (DEPAKOTE) 500 MG DR tablet, Take 500 mg by mouth 2 (two) times daily. , Disp: , Rfl:  .  Elastic Bandages & Supports (T.E.D. BELOW KNEE/X-LARGE) MISC, 1 application by Does not apply route daily., Disp: 6 each, Rfl: 0 .  gabapentin (NEURONTIN) 300 MG capsule, Take 600 mg by mouth 2 (two) times daily. , Disp: , Rfl:  .  ipratropium-albuterol (DUONEB) 0.5-2.5 (3) MG/3ML SOLN, Take 3 mLs by nebulization every 6 (six) hours as needed (wheezing/shortness of breath)., Disp: , Rfl:  .  isosorbide mononitrate (IMDUR) 30 MG 24 hr tablet, Take 30 mg by mouth daily., Disp: , Rfl:  .  losartan (COZAAR) 25 MG tablet, Take 1 tablet (25 mg total) by mouth daily., Disp: 30 tablet, Rfl: 11 .  Melatonin 5 MG TABS, Take 5 mg by mouth at bedtime., Disp: , Rfl:  .  metoprolol succinate (TOPROL XL) 25 MG 24 hr tablet,  Take 1 tablet (25 mg total) by mouth daily., Disp: 90 tablet, Rfl: 3 .  nitroGLYCERIN (NITROSTAT) 0.4 MG SL tablet, Place 0.4 mg under the tongue every 5 (five) minutes as needed for chest pain., Disp: , Rfl:  .  omeprazole (PRILOSEC) 40 MG capsule, Take 40 mg by mouth daily., Disp: , Rfl:  .  rivaroxaban (XARELTO) 20 MG TABS tablet, Take 20 mg by mouth at bedtime., Disp: , Rfl:  .  rosuvastatin (CRESTOR) 20 MG tablet, Take 20 mg by mouth daily., Disp: , Rfl:  .  senna (SENOKOT) 8.6 MG tablet, Take 2 tablets by mouth as needed. , Disp: , Rfl:  .  spironolactone (ALDACTONE) 25 MG tablet, Take 0.5 tablets (12.5 mg total) by mouth daily., Disp: 45 tablet, Rfl: 3 .  tamsulosin (FLOMAX) 0.4 MG CAPS capsule, TAKE ONE CAPSULE BY MOUTH EVERY DAY AFTER SUPPER, Disp: 30 capsule, Rfl: 11 .  torsemide (DEMADEX) 20 MG tablet, 2 tablets (40 mg) in the AM and 1 tablet (20 mg) at lunch., Disp: 270 tablet, Rfl: 3 .  traZODone (DESYREL) 50 MG tablet, Take 100 mg by mouth at bedtime., Disp: , Rfl:  .  vortioxetine HBr (TRINTELLIX) 5 MG TABS, Take 5 mg by mouth daily. , Disp: , Rfl:   Past Medical History: Past Medical History:  Diagnosis Date  . Acid reflux   .  Anxiety   . Arthritis   . Atrial fibrillation ([place])   . CHF (congestive heart failure) ([place])   . Chronic orthostatic hypotension   . Clotting disorder ([place])   . COPD (chronic obstructive pulmonary disease) ([place])   . Depression   . Elevated PSA   . Heart attack ([place])   . Heart disease   . Heart failure ([place])   . Hepatitis   . High cholesterol   . Hypertension   . Sleep apnea   . TBI (traumatic brain injury) ([place])   . Urinary retention     Tobacco Use: Social History   Tobacco Use  Smoking Status Former Smoker  . Packs/day: 1.00  . Years: 40.00  . Pack years: 40.00  . Types: Cigarettes  . Last attempt to quit: 08/2015  . Years since quitting: 2.4  Smokeless Tobacco Never Used    Labs: Recent Review [employer] for ITP  Cardiac and Pulmonary Rehab Latest Ref Rng & Units 11/23/2017   Cholestrol 100 - 199 mg/dL 114   LDLCALC 0 - 99 mg/dL 60   HDL >39 mg/dL 30(L)   Trlycerides 0 - 149 mg/dL 119       Exercise Target Goals:    Exercise Program Goal: Individual exercise prescription set using results from initial 6 min walk test and THRR while considering  patient's activity barriers and safety.   Exercise Prescription Goal: Initial exercise prescription builds to 30-45 minutes a day of aerobic activity, 2-3 days per week.  Home exercise guidelines will be given to patient during program as part of exercise prescription that the participant will acknowledge.  Activity Barriers & Risk Stratification: Activity Barriers & Cardiac Risk Stratification - 12/02/17 1054      Activity Barriers & Cardiac Risk Stratification   Activity Barriers  Deconditioning;Muscular Weakness;Shortness of Breath;Back Problems;Joint Problems;Decreased Ventricular Function;Balance Concerns;History of Falls;Neck/Spine Problems    Cardiac Risk Stratification  High       6 Minute Walk: 6 Minute Walk    Row Name 12/02/17 1052         6 Minute Walk   Phase  Initial Using Pulmonary Admission Test from 09/20/17     Distance  1002 feet     Distance % Change  23.8 %     Walk Time  6 minutes     # of Rest Breaks  0     MPH  1.9     METS  1.97     RPE  15     Perceived Dyspnea   2     VO2 Peak  6.88     Symptoms  No     Resting HR  70 bpm     Resting BP  128/56     Resting Oxygen Saturation   99 %     Exercise Oxygen Saturation  during 6 min walk  97 %     Max Ex. HR  104 bpm     Max Ex. BP  136/84     2 Minute Post BP  134/74       Interval HR   1 Minute HR  92     2 Minute HR  91     3 Minute HR  93     4 Minute HR  89     5 Minute HR  97     6 Minute HR  104     2 Minute Post HR  72  Interval Heart Rate?  Yes       Interval Oxygen   Interval Oxygen?  Yes     Baseline Oxygen Saturation %  99 %     1 Minute  Oxygen Saturation %  97 %     1 Minute Liters of Oxygen  0 L Room Air     2 Minute Oxygen Saturation %  97 %     2 Minute Liters of Oxygen  0 L     3 Minute Oxygen Saturation %  97 %     3 Minute Liters of Oxygen  0 L     4 Minute Oxygen Saturation %  98 %     4 Minute Liters of Oxygen  0 L     5 Minute Oxygen Saturation %  97 %     5 Minute Liters of Oxygen  0 L     6 Minute Oxygen Saturation %  97 %     6 Minute Liters of Oxygen  0 L     2 Minute Post Oxygen Saturation %  99 %     2 Minute Post Liters of Oxygen  0 L        Oxygen Initial Assessment:   Oxygen Re-Evaluation:   Oxygen Discharge (Final Oxygen Re-Evaluation):   Initial Exercise Prescription: Initial Exercise Prescription - 12/02/17 1000      Date of Initial Exercise RX and Referring Provider   Date  12/02/17    Referring Provider  Ida Rogue MD      Treadmill   MPH  2    Grade  0    Minutes  15    METs  2.53      NuStep   Level  3    SPM  80    Minutes  15    METs  2.3      Arm Ergometer   Level  1    RPM  50    Minutes  15    METs  2      Prescription Details   Frequency (times per week)  3    Duration  Progress to 45 minutes of aerobic exercise without signs/symptoms of physical distress      Intensity   THRR 40-80% of Max Heartrate  104-138    Ratings of Perceived Exertion  11-13    Perceived Dyspnea  0-4      Progression   Progression  Continue to progress workloads to maintain intensity without signs/symptoms of physical distress.      Resistance Training   Training Prescription  Yes    Weight  4 lbs    Reps  10-15       Perform Capillary Blood Glucose checks as needed.  Exercise Prescription Changes: Exercise Prescription Changes    Row Name 12/23/17 0900 12/29/17 1500 01/14/18 0800         Response to Exercise   Blood Pressure (Admit)  -  146/78  122/84     Blood Pressure (Exercise)  -  132/60  144/88     Blood Pressure (Exit)  -  112/74  118/78     Heart Rate  (Admit)  -  73 bpm  72 bpm     Heart Rate (Exercise)  -  112 bpm  95 bpm     Heart Rate (Exit)  -  74 bpm  63 bpm     Perceived Dyspnea (Exercise)  -  13  11  Symptoms  -  none  none     Duration  -  Continue with 30 min of aerobic exercise without signs/symptoms of physical distress.  Continue with 30 min of aerobic exercise without signs/symptoms of physical distress.     Intensity  -  THRR unchanged  THRR unchanged       Progression   Progression  -  Continue to progress workloads to maintain intensity without signs/symptoms of physical distress.  Continue to progress workloads to maintain intensity without signs/symptoms of physical distress.     Average METs  -  2.25  2.25       Resistance Training   Training Prescription  -  Yes  Yes     Weight  -  4 lbs  4 lb     Reps  -  10-15  10-15       Interval Training   Interval Training  -  No  No       NuStep   Level  -  6  4     Minutes  -  15  15     METs  -  3  -       Arm Ergometer   Level  -  1  -     Minutes  -  15  -     METs  -  1.9  -       Home Exercise Plan   Plans to continue exercise at  [employer] (comment) water aerobics at [employer] (comment) water aerobics at [employer] (comment) water aerobics at [place] 2 additional days to program exercise sessions.  Add 2 additional days to program exercise sessions.  Add 2 additional days to program exercise sessions.     Initial Home Exercises Provided  09/29/17  09/29/17  09/29/17        Exercise Comments: Exercise Comments    Row Name 12/02/17 [id number]           Exercise Comments  First full day of exercise!  Patient was oriented to gym and equipment including functions, settings, policies, and procedures.  Patient's individual exercise prescription and treatment plan were reviewed.  All starting workloads were established based on the results of the 6 minute walk test done at initial  orientation visit.  The plan for exercise progression was also introduced and progression will be customized based on patient's performance and goals.          Exercise Goals and Review: Exercise Goals    Row Name 12/02/17 1059             Exercise Goals   Increase Physical Activity  Yes       Intervention  Provide advice, education, support and counseling about physical activity/exercise needs.;Develop an individualized exercise prescription for aerobic and resistive training based on initial evaluation findings, risk stratification, comorbidities and participant's personal goals.       Expected Outcomes  Short Term: Attend rehab on a regular basis to increase amount of physical activity.;Long Term: Add in home exercise to make exercise part of routine and to increase amount of physical activity.;Long Term: Exercising regularly at least 3-5 days a week.       Increase Strength and Stamina  Yes       Intervention  Provide advice, education, support and counseling about physical activity/exercise needs.;Develop an individualized exercise prescription  for aerobic and resistive training based on initial evaluation findings, risk stratification, comorbidities and participant's personal goals.       Expected Outcomes  Short Term: Increase workloads from initial exercise prescription for resistance, speed, and METs.;Short Term: Perform resistance training exercises routinely during rehab and add in resistance training at home;Long Term: Improve cardiorespiratory fitness, muscular endurance and strength as measured by increased METs and functional capacity (6MWT)       Able to understand and use rate of perceived exertion (RPE) scale  Yes       Intervention  Provide education and explanation on how to use RPE scale       Expected Outcomes  Short Term: Able to use RPE daily in rehab to express subjective intensity level;Long Term:  Able to use RPE to guide intensity level when exercising independently        Able to understand and use Dyspnea scale  Yes       Intervention  Provide education and explanation on how to use Dyspnea scale       Expected Outcomes  Short Term: Able to use Dyspnea scale daily in rehab to express subjective sense of shortness of breath during exertion;Long Term: Able to use Dyspnea scale to guide intensity level when exercising independently       Knowledge and understanding of Target Heart Rate Range (THRR)  Yes       Intervention  Provide education and explanation of THRR including how the numbers were predicted and where they are located for reference       Expected Outcomes  Short Term: Able to state/look up THRR;Long Term: Able to use THRR to govern intensity when exercising independently;Short Term: Able to use daily as guideline for intensity in rehab       Able to check pulse independently  Yes       Intervention  Provide education and demonstration on how to check pulse in carotid and radial arteries.;Review the importance of being able to check your own pulse for safety during independent exercise       Expected Outcomes  Short Term: Able to explain why pulse checking is important during independent exercise;Long Term: Able to check pulse independently and accurately       Understanding of Exercise Prescription  Yes       Intervention  Provide education, explanation, and written materials on patient's individual exercise prescription       Expected Outcomes  Short Term: Able to explain program exercise prescription;Long Term: Able to explain home exercise prescription to exercise independently          Exercise Goals Re-Evaluation : Exercise Goals Re-Evaluation    Row Name 12/02/17 0834 12/14/17 1428 12/23/17 0940 12/29/17 1523 01/14/18 0810     Exercise Goal Re-Evaluation   Exercise Goals Review  Increase Physical Activity;Increase Strength and Stamina;Able to understand and use rate of perceived exertion (RPE) scale  -  Increase Physical Activity;Able to  understand and use rate of perceived exertion (RPE) scale;Knowledge and understanding of Target Heart Rate Range (THRR);Understanding of Exercise Prescription  Increase Physical Activity;Increase Strength and Stamina;Understanding of Exercise Prescription  Increase Physical Activity;Increase Strength and Stamina;Able to understand and use rate of perceived exertion (RPE) scale   Comments  Reviewed RPE scale, THR and program prescription with pt today.  Pt voiced understanding and was given a copy of goals to take home.   Out since last review  Reviewed home exercise again to update for cardiac sessions.  Kevin Pearson has been doing well in rehab.  Kevin Pearson is still on level 1 for the arm crank, but Kevin Pearson forgets to try to make progression.   Kevin Pearson has been avoiding the treadmill and we will make sure that Kevin Pearson is able to get back on there.  We will continue to work on his progress and reminding him to advance his workloads.    Pt tolerates exercise well. Staff will encourage walking.     Expected Outcomes  Short: Use RPE daily to regulate intensity.  Long: Follow program prescription in THR.  -  Short: Add back in home exercise.  Long: Continue to increase phsyical activity  Short: Increase arm crank workloads.  Long: Continue to exercise at home.   Short - add TM back to sessions Long - increase MET level      Discharge Exercise Prescription (Final Exercise Prescription Changes): Exercise Prescription Changes - 01/14/18 0800      Response to Exercise   Blood Pressure (Admit)  122/84    Blood Pressure (Exercise)  144/88    Blood Pressure (Exit)  118/78    Heart Rate (Admit)  72 bpm    Heart Rate (Exercise)  95 bpm    Heart Rate (Exit)  63 bpm    Perceived Dyspnea (Exercise)  11    Symptoms  none    Duration  Continue with 30 min of aerobic exercise without signs/symptoms of physical distress.    Intensity  THRR unchanged      Progression   Progression  Continue to progress workloads to maintain intensity without  signs/symptoms of physical distress.    Average METs  2.25      Resistance Training   Training Prescription  Yes    Weight  4 lb    Reps  10-15      Interval Training   Interval Training  No      NuStep   Level  4    Minutes  15      Home Exercise Plan   Plans to continue exercise at  [employer] (comment) water aerobics at [place] 2 additional days to program exercise sessions.    Initial Home Exercises Provided  09/29/17       Nutrition:  Target Goals: Understanding of nutrition guidelines, daily intake of sodium <1552m, cholesterol <2051m calories 30% from fat and 7% or less from saturated fats, daily to have 5 or more servings of fruits and vegetables.  Biometrics: Pre Biometrics - 12/02/17 1101      Pre Biometrics   Height  5' 7.4" (1.712 m)    Weight  256 lb 12.8 oz (116.5 kg)    Waist Circumference  45 inches    Hip Circumference  46 inches    Waist to Hip Ratio  0.98 %    BMI (Calculated)  39.74        Nutrition Therapy Plan and Nutrition Goals:   Nutrition Assessments: Nutrition Assessments - 12/02/17 1124      MEDFICTS Scores   Pre Score  67       Nutrition Goals Re-Evaluation: Nutrition Goals Re-Evaluation    Row Name 01/13/18 0811             Goals   Nutrition Goal  Look for ways to include additional servings of vegetables in your daily diet. Steaming, sauteeing, grilling, and roasting are all great cooking methods for them.       Comment  Kevin Pearson has been trying to include more vegetables in his diet, Kevin Pearson is hit or miss wth this. Kevin Pearson sees a RD every month       Expected Outcome  Kevin Pearson will include servings of vegetables daily when planning meals work on portion control.         Personal Goal #2 Re-Evaluation   Personal Goal #2  Pay closer attention to portion sizes at meal times, particularly starchy foods. Attempting to work on this. Cooks at home eats out once a week.          Nutrition Goals Discharge (Final  Nutrition Goals Re-Evaluation): Nutrition Goals Re-Evaluation - 01/13/18 0811      Goals   Nutrition Goal  Look for ways to include additional servings of vegetables in your daily diet. Steaming, sauteeing, grilling, and roasting are all great cooking methods for them.    Comment  Kevin Pearson has been trying to include more vegetables in his diet, Kevin Pearson is hit or miss wth this. Kevin Pearson sees a RD every month    Expected Outcome  Kevin Pearson will include servings of vegetables daily when planning meals work on portion control.      Personal Goal #2 Re-Evaluation   Personal Goal #2  Pay closer attention to portion sizes at meal times, particularly starchy foods. Attempting to work on this. Cooks at home eats out once a week.       Psychosocial: Target Goals: Acknowledge presence or absence of significant depression and/or stress, maximize coping skills, provide positive support system. Participant is able to verbalize types and ability to use techniques and skills needed for reducing stress and depression.   Initial Review & Psychosocial Screening:   Quality of Life Scores:  Quality of Life - 12/02/17 1116      Quality of Life Scores   Health/Function Pre  7.8 %    Socioeconomic Pre  16.83 %    Psych/Spiritual Pre  19.64 %    Family Pre  15.75 %    GLOBAL Pre  13.08 %      Scores of 19 and below usually indicate a poorer quality of life in these areas.  A difference of  2-3 points is a clinically meaningful difference.  A difference of 2-3 points in the total score of the Quality of Life Index has been associated with significant improvement in overall quality of life, self-image, physical symptoms, and general health in studies assessing change in quality of life.  PHQ-9: Recent Review Flowsheet Data    Depression screen [facility] 2/9 12/27/2017 12/02/2017 10/06/2017 09/23/2017 09/20/2017   Decreased Interest 0 2 2 - 3   Down, Depressed, Hopeless 0 _0 PHQ - 2 Score 0 _1 Altered sleeping - 1 2 - 2    Tired, decreased energy - 1 2 - 2   Change in appetite - 1 2 - 3   Feeling bad or failure about yourself  - 1 2 - 2   Trouble concentrating - 1 1 - 1   Moving slowly or fidgety/restless - 1 1 - 1   Suicidal thoughts - 0 1 0 1    PHQ-9 Score - 10 14 - 17   Difficult doing work/chores - Somewhat difficult - - Very difficult     Interpretation of Total Score  Total Score Depression Severity:  1-4 = Minimal depression, 5-9 = Mild depression, 10-14 = Moderate depression, 15-19 = Moderately severe depression, 20-27 = Severe  depression   Psychosocial Evaluation and Intervention: Psychosocial Evaluation - 12/28/17 0939      Psychosocial Evaluation & Interventions   Comments  Kevin Pearson) returned to Cardiac Rehab due to increased pulmonary and cardiac difficulties.  Counselor met with him today for updated psychosocial evaluation.  Kevin Pearson is 67 years old and has a limited support system currently - reporting his daughter wants to "put him in a nursing home."  Kevin Pearson is sleeping better now with 6-7 hours with the use of medication and his appetite is good.  Kevin Pearson reports a history of depression and anxiety and states his current medications for this are working well as his mood is generally positive and less irritable.  Stress in his life are his health and family conflict.  Kevin Pearson has goals to increase his ability to breathe and be able to do more activities.  Kevin Pearson will be followed by staff.      Expected Outcomes  Short:  Kevin Pearson will improve his breathing with exercise.   Long:  Kevin Pearson will cope more positively with skills learned and increase his support system for more positive interactions.      Continue Psychosocial Services   Follow up required by staff       Psychosocial Re-Evaluation: Psychosocial Re-Evaluation    Kevin Pearson Name 01/13/18 0814             Psychosocial Re-Evaluation   Current issues with  Current Sleep Concerns          Psychosocial Discharge (Final Psychosocial  Re-Evaluation): Psychosocial Re-Evaluation - 01/13/18 0814      Psychosocial Re-Evaluation   Current issues with  Current Sleep Concerns       Vocational Rehabilitation: Provide vocational rehab assistance to qualifying candidates.   Vocational Rehab Evaluation & Intervention:   Education: Education Goals: Education classes will be provided on a variety of topics geared toward better understanding of heart health and risk factor modification. Participant will state understanding/return demonstration of topics presented as noted by education test scores.  Learning Barriers/Preferences: Learning Barriers/Preferences - 12/02/17 1109      Learning Barriers/Preferences   Learning Barriers  Hearing;Sight    Learning Preferences  Skilled Demonstration;Written Material;Computer/Internet;Group Instruction;Verbal Instruction       Education Topics:  AED/CPR: - Group verbal and written instruction with the use of models to demonstrate the basic use of the AED with the basic ABC's of resuscitation.   General Nutrition Guidelines/Fats and Fiber: -Group instruction provided by verbal, written material, models and posters to present the general guidelines for heart healthy nutrition. Gives an explanation and review of dietary fats and fiber.   Cardiac Rehab from 01/13/2018 in [facility] Cardiac and Pulmonary Rehab  Date  09/27/17  Educator  CR  Instruction Review Code  1- Verbalizes Understanding      Controlling Sodium/Reading Food Labels: -Group verbal and written material supporting the discussion of sodium use in heart healthy nutrition. Review and explanation with models, verbal and written materials for utilization of the food label.   Exercise Physiology & General Exercise Guidelines: - Group verbal and written instruction with models to review the exercise physiology of the cardiovascular system and associated critical values. Provides general exercise guidelines with specific  guidelines to those with heart or lung disease.    Cardiac Rehab from 10/27/2016 in [facility] Cardiac and Pulmonary Rehab  Date  09/22/16  Educator  [facility]  Instruction Review Code (retired)  2- meets goals/outcomes      Aerobic Exercise & Resistance  Training: - Gives group verbal and written instruction on the various components of exercise. Focuses on aerobic and resistive training programs and the benefits of this training and how to safely progress through these programs..   Flexibility, Balance, Mind/Body Relaxation: Provides group verbal/written instruction on the benefits of flexibility and balance training, including mind/body exercise modes such as yoga, pilates and tai chi.  Demonstration and skill practice provided.   Cardiac Rehab from 01/13/2018 in [facility] Cardiac and Pulmonary Rehab  Date  12/23/17  Educator  AS  Instruction Review Code  1- Verbalizes Understanding      Stress and Anxiety: - Provides group verbal and written instruction about the health risks of elevated stress and causes of high stress.  Discuss the correlation between heart/lung disease and anxiety and treatment options. Review healthy ways to manage with stress and anxiety.   Cardiac Rehab from 10/27/2016 in [facility] Cardiac and Pulmonary Rehab  Date  08/13/16  Educator  TS  Instruction Review Code (retired)  2- meets goals/outcomes      Depression: - Provides group verbal and written instruction on the correlation between heart/lung disease and depressed mood, treatment options, and the stigmas associated with seeking treatment.   Cardiac Rehab from 01/13/2018 in [facility] Cardiac and Pulmonary Rehab  Date  11/17/17  Educator  [facility]  Instruction Review Code  1- Verbalizes Understanding      Anatomy & Physiology of the Heart: - Group verbal and written instruction and models provide basic cardiac anatomy and physiology, with the coronary electrical and arterial systems. Review of Valvular disease and Heart Failure    Cardiac Rehab from 01/13/2018 in [facility] Cardiac and Pulmonary Rehab  Date  01/13/18  Educator  SB  Instruction Review Code  1- Verbalizes Understanding      Cardiac Procedures: - Group verbal and written instruction to review commonly prescribed medications for heart disease. Reviews the medication, class of the drug, and side effects. Includes the steps to properly store meds and maintain the prescription regimen. (beta blockers and nitrates)   Cardiac Rehab from 10/27/2016 in [facility] Cardiac and Pulmonary Rehab  Date  10/13/16  Educator  SB  Instruction Review Code (retired)  2- meets goals/outcomes      Cardiac Medications I: - Group verbal and written instruction to review commonly prescribed medications for heart disease. Reviews the medication, class of the drug, and side effects. Includes the steps to properly store meds and maintain the prescription regimen.   Cardiac Rehab from 01/13/2018 in [facility] Cardiac and Pulmonary Rehab  Date  12/28/17  Educator  SB  Instruction Review Code  1- Verbalizes Understanding      Cardiac Medications II: -Group verbal and written instruction to review commonly prescribed medications for heart disease. Reviews the medication, class of the drug, and side effects. (all other drug classes)   Cardiac Rehab from 01/13/2018 in [facility] Cardiac and Pulmonary Rehab  Date  12/16/17  Educator  CE  Instruction Review Code  1- Verbalizes Understanding       Go Sex-Intimacy & Heart Disease, Get SMART - Goal Setting: - Group verbal and written instruction through game format to discuss heart disease and the return to sexual intimacy. Provides group verbal and written material to discuss and apply goal setting through the application of the S.M.A.R.T. Method.   Cardiac Rehab from 10/27/2016 in [facility] Cardiac and Pulmonary Rehab  Date  10/13/16  Educator  SB  Instruction Review Code (retired)  2- meets goals/outcomes  Other Matters of the Heart: - Provides  group verbal, written materials and models to describe Stable Angina and Peripheral Artery. Includes description of the disease process and treatment options available to the cardiac patient.   Cardiac Rehab from 01/13/2018 in [facility] Cardiac and Pulmonary Rehab  Date  01/13/18  Educator  SB  Instruction Review Code  1- Verbalizes Understanding      Exercise & Equipment Safety: - Individual verbal instruction and demonstration of equipment use and safety with use of the equipment.   Cardiac Rehab from 01/13/2018 in [facility] Cardiac and Pulmonary Rehab  Date  09/20/17  Educator  [facility]  Instruction Review Code  1- Verbalizes Understanding      Infection Prevention: - Provides verbal and written material to individual with discussion of infection control including proper hand washing and proper equipment cleaning during exercise session.   Cardiac Rehab from 01/13/2018 in [facility] Cardiac and Pulmonary Rehab  Date  09/20/17  Educator  [facility]  Instruction Review Code  1- Verbalizes Understanding      Falls Prevention: - Provides verbal and written material to individual with discussion of falls prevention and safety.   Cardiac Rehab from 01/13/2018 in [facility] Cardiac and Pulmonary Rehab  Date  09/20/17  Educator  [facility]  Instruction Review Code  1- Verbalizes Understanding      Diabetes: - Individual verbal and written instruction to review signs/symptoms of diabetes, desired ranges of glucose level fasting, after meals and with exercise. Acknowledge that pre and post exercise glucose checks will be done for 3 sessions at entry of program.   Know Your Numbers and Risk Factors: -Group verbal and written instruction about important numbers in your health.  Discussion of what are risk factors and how they play a role in the disease process.  Review of Cholesterol, Blood Pressure, Diabetes, and BMI and the role they play in your overall health.   Cardiac Rehab from 01/13/2018 in [facility] Cardiac and Pulmonary Rehab   Date  12/16/17  Educator  CE  Instruction Review Code  1- Verbalizes Understanding      Sleep Hygiene: -Provides group verbal and written instruction about how sleep can affect your health.  Define sleep hygiene, discuss sleep cycles and impact of sleep habits. Review good sleep hygiene tips.    Other: -Provides group and verbal instruction on various topics (see comments)   Knowledge Questionnaire Score: Knowledge Questionnaire Score - 12/02/17 1122      Knowledge Questionnaire Score   Pre Score  24/26 Focus for education Angina and Nutrition       Core Components/Risk Factors/Patient Goals at Admission: Personal Goals and Risk Factors at Admission - 12/02/17 1102      Core Components/Risk Factors/Patient Goals on Admission    Weight Management  Yes;Obesity;Weight Loss    Intervention  Weight Management: Develop a combined nutrition and exercise program designed to reach desired caloric intake, while maintaining appropriate intake of nutrient and fiber, sodium and fats, and appropriate energy expenditure required for the weight goal.;Weight Management: Provide education and appropriate resources to help participant work on and attain dietary goals.;Weight Management/Obesity: Establish reasonable short term and long term weight goals.;Obesity: Provide education and appropriate resources to help participant work on and attain dietary goals.    Admit Weight  256 lb 12.8 oz (116.5 kg)    Goal Weight: Short Term  252 lb (114.3 kg)    Goal Weight: Long Term  180 lb (81.6 kg)    Expected Outcomes  Short Term:  Continue to assess and modify interventions until short term weight is achieved;Long Term: Adherence to nutrition and physical activity/exercise program aimed toward attainment of established weight goal;Weight Loss: Understanding of general recommendations for a balanced deficit meal plan, which promotes 1-2 lb weight loss per week and includes a negative energy balance of [phone]  kcal/d;Understanding recommendations for meals to include 15-35% energy as protein, 25-35% energy from fat, 35-60% energy from carbohydrates, less than 29m of dietary cholesterol, 20-35 gm of total fiber daily;Understanding of distribution of calorie intake throughout the day with the consumption of 4-5 meals/snacks    Improve shortness of breath with ADL's  Yes    Intervention  Provide education, individualized exercise plan and daily activity instruction to help decrease symptoms of SOB with activities of daily living.    Expected Outcomes  Short Term: Improve cardiorespiratory fitness to achieve a reduction of symptoms when performing ADLs;Long Term: Be able to perform more ADLs without symptoms or delay the onset of symptoms    Heart Failure  Yes    Intervention  Provide a combined exercise and nutrition program that is supplemented with education, support and counseling about heart failure. Directed toward relieving symptoms such as shortness of breath, decreased exercise tolerance, and extremity edema.    Expected Outcomes  Improve functional capacity of life;Short term: Attendance in program 2-3 days a week with increased exercise capacity. Reported lower sodium intake. Reported increased fruit and vegetable intake. Reports medication compliance.;Short term: Daily weights obtained and reported for increase. Utilizing diuretic protocols set by physician.;Long term: Adoption of self-care skills and reduction of barriers for early signs and symptoms recognition and intervention leading to self-care maintenance.    Hypertension  Yes    Intervention  Provide education on lifestyle modifcations including regular physical activity/exercise, weight management, moderate sodium restriction and increased consumption of fresh fruit, vegetables, and low fat dairy, alcohol moderation, and smoking cessation.;Monitor prescription use compliance.    Expected Outcomes  Short Term: Continued assessment and  intervention until BP is < 140/954mHG in hypertensive participants. < 130/8050mG in hypertensive participants with diabetes, heart failure or chronic kidney disease.;Long Term: Maintenance of blood pressure at goal levels.    Lipids  Yes    Intervention  Provide education and support for participant on nutrition & aerobic/resistive exercise along with prescribed medications to achieve LDL <32m55mDL >40mg12m Expected Outcomes  Short Term: Participant states understanding of desired cholesterol values and is compliant with medications prescribed. Participant is following exercise prescription and nutrition guidelines.;Long Term: Cholesterol controlled with medications as prescribed, with individualized exercise RX and with personalized nutrition plan. Value goals: LDL < 32mg,39m > 40 mg.    Stress  Yes    Intervention  Offer individual and/or small group education and counseling on adjustment to heart disease, stress management and health-related lifestyle change. Teach and support self-help strategies.;Refer participants experiencing significant psychosocial distress to appropriate mental health specialists for further evaluation and treatment. When possible, include family members and significant others in education/counseling sessions.    Expected Outcomes  Short Term: Participant demonstrates changes in health-related behavior, relaxation and other stress management skills, ability to obtain effective social support, and compliance with psychotropic medications if prescribed.;Long Term: Emotional wellbeing is indicated by absence of clinically significant psychosocial distress or social isolation.       Core Components/Risk Factors/Patient Goals Review:    Core Components/Risk Factors/Patient Goals at Discharge (Final Review):    ITP Comments: ITP Comments  [place] Name 11/22/17 1641 30-Dec-2017 1049 12/14/17 1428 12/22/17 0604 01/19/18 0545   ITP Comments  [place] called to let us know that Kevin Pearson  was back in the emergency room.  Kevin Pearson saw his pulmonologist today and that they think it nerve damage that is causing several of his problems.  Kevin Pearson will be getting that theory tested.  Kevin Pearson also has a pacer check and blood work with cardiology and get sonogram of carotids.  Dr. Rockey Situ wants [place] on heart monitor for exercise. We will discharge Domonick from Pulmonary Rehab and get him back into Cardiac Rehab for him to wear the monitor.  Kevin Pearson is going to stop by this week to pick up paperwork and set up his start date for Cardiac.   Kenden started in Cardiac Rehab today.  Documentation for diagnosis can be found in [facility] enounter from 5/21 and admission from 5/15.  New Cardaic ITP created and sent to Dr. Emily Filbert, Medical Director for review and signature.   Called to check on pt as Kevin Pearson has been out since 12-31-2022.  Kevin Pearson had a death in the family and needed to go to [place]. Kevin Pearson also took time to visit with his family. 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  Kevin Pearson has also been sick.  Kevin Pearson is hoping to try to come in on Thursday.   30 day review. Continue with ITP unless directed changes per Medical Director review  30 day review. Continue with ITP unless directed changes per Medical Director review.   One session completed in July.  [place]      Comments: 30 day review. Continue with ITP unless directed changes per Medical Director review.

## 2018-01-25 DIAGNOSIS — J449 Chronic obstructive pulmonary disease, unspecified: Secondary | ICD-10-CM

## 2018-01-25 DIAGNOSIS — I5022 Chronic systolic (congestive) heart failure: Secondary | ICD-10-CM | POA: Diagnosis not present

## 2018-01-25 NOTE — Progress Notes (Signed)
Daily Session Note  Patient Details  Name: Kevin Pearson MRN: [id number] Date of Birth: Jun 19, 1951 Referring Provider:     Cardiac Rehab from 12/02/2017 in [facility] Cardiac and Pulmonary Rehab  Referring Provider  Ida Rogue MD      Encounter Date: 01/25/2018  Check In: Session Check In - 01/25/18 0835      Check-In   Location  [facility]-Cardiac & Pulmonary Rehab    Staff Present  Heath Lark, RN, BSN, CCRP;Mandi Tylisa Alcivar, BS, PEC;Nada Maclachlan, BA, ACSM CEP, Exercise Physiologist    Supervising physician immediately available to respond to emergencies  See telemetry face sheet for immediately available ER MD    Medication changes reported      No    Fall or balance concerns reported     No    Tobacco Cessation  No Change    Warm-up and Cool-down  Performed on first and last piece of equipment    Resistance Training Performed  Yes    VAD Patient?  No    PAD/SET Patient?  No      Pain Assessment   Currently in Pain?  No/denies    Multiple Pain Sites  No          Social History   Tobacco Use  Smoking Status Former Smoker  . Packs/day: 1.00  . Years: 40.00  . Pack years: 40.00  . Types: Cigarettes  . Last attempt to quit: 08/2015  . Years since quitting: 2.4  Smokeless Tobacco Never Used    Goals Met:  Independence with exercise equipment Exercise tolerated well No report of cardiac concerns or symptoms Strength training completed today  Goals Unmet:  Not Applicable  Comments: Pt able to follow exercise prescription today without complaint.  Will continue to monitor for progression.    Dr. Emily Filbert is Medical Director for [place] and [name].

## 2018-02-01 DIAGNOSIS — I5022 Chronic systolic (congestive) heart failure: Secondary | ICD-10-CM

## 2018-02-01 DIAGNOSIS — J449 Chronic obstructive pulmonary disease, unspecified: Secondary | ICD-10-CM

## 2018-02-01 NOTE — Progress Notes (Signed)
Daily Session Note  Patient Details  Name: Makani Seckman MRN: [id number] Date of Birth: 26-Feb-1951 Referring Provider:     Cardiac Rehab from 12/02/2017 in [facility] Cardiac and Pulmonary Rehab  Referring Provider  Ida Rogue MD      Encounter Date: 02/01/2018  Check In: Session Check In - 02/01/18 0923      Check-In   Supervising physician immediately available to respond to emergencies  See telemetry face sheet for immediately available ER MD    Location  [facility]-Cardiac & Pulmonary Rehab    Staff Present  Heath Lark, RN, BSN, CCRP;Jessica [place], MA, RCEP, CCRP, Exercise Physiologist;Ewan Grau Oletta Darter, [place], ACSM CEP, Exercise Physiologist    Medication changes reported      No    Fall or balance concerns reported     No    Warm-up and Cool-down  Performed on first and last piece of equipment    Resistance Training Performed  Yes    VAD Patient?  No    PAD/SET Patient?  No      Pain Assessment   Currently in Pain?  No/denies    Multiple Pain Sites  No          Social History   Tobacco Use  Smoking Status Former Smoker  . Packs/day: 1.00  . Years: 40.00  . Pack years: 40.00  . Types: Cigarettes  . Last attempt to quit: 08/2015  . Years since quitting: 2.4  Smokeless Tobacco Never Used    Goals Met:  Independence with exercise equipment Exercise tolerated well No report of cardiac concerns or symptoms Strength training completed today  Goals Unmet:  Not Applicable  Comments: Pt able to follow exercise prescription today without complaint.  Will continue to monitor for progression.    Dr. Emily Filbert is Medical Director for [place] and [name].

## 2018-02-03 ENCOUNTER — Encounter: Payer: Medicare Other | Attending: Family Medicine | Admitting: *Deleted

## 2018-02-03 DIAGNOSIS — I5022 Chronic systolic (congestive) heart failure: Secondary | ICD-10-CM | POA: Diagnosis present

## 2018-02-03 NOTE — Progress Notes (Signed)
Daily Session Note  Patient Details  Name: Kevin Pearson MRN: [id number] Date of Birth: September 27, 1950 Referring Provider:     Cardiac Rehab from 12/02/2017 in [facility] Cardiac and Pulmonary Rehab  Referring Provider  Ida Rogue MD      Encounter Date: 02/03/2018  Check In: Session Check In - 02/03/18 0918      Check-In   Supervising physician immediately available to respond to emergencies  See telemetry face sheet for immediately available ER MD    Location  [facility]-Cardiac & Pulmonary Rehab    Staff Present  Gerlene Burdock, RN, BSN;Essam Lowdermilk Luan Pulling, MA, RCEP, CCRP, Exercise Physiologist;Amanda Oletta Darter, BA, ACSM CEP, Exercise Physiologist    Medication changes reported      No    Fall or balance concerns reported     No    Warm-up and Cool-down  Performed on first and last piece of equipment    Resistance Training Performed  Yes    VAD Patient?  No    PAD/SET Patient?  No      Pain Assessment   Currently in Pain?  No/denies          Social History   Tobacco Use  Smoking Status Former Smoker  . Packs/day: 1.00  . Years: 40.00  . Pack years: 40.00  . Types: Cigarettes  . Last attempt to quit: 08/2015  . Years since quitting: 2.4  Smokeless Tobacco Never Used    Goals Met:  Independence with exercise equipment Exercise tolerated well No report of cardiac concerns or symptoms Strength training completed today  Goals Unmet:  Not Applicable  Comments: Pt able to follow exercise prescription today without complaint.  Will continue to monitor for progression.    Dr. Emily Filbert is Medical Director for [place] and [name].

## 2018-02-08 ENCOUNTER — Ambulatory Visit
Admission: EM | Admit: 2018-02-08 | Discharge: 2018-02-08 | Disposition: A | Discharge status: Home / Self Care | Payer: Medicare Other | Attending: Family Medicine | Admitting: Family Medicine

## 2018-02-08 DIAGNOSIS — T162XXA Foreign body in left ear, initial encounter: Secondary | ICD-10-CM

## 2018-02-08 NOTE — Discharge Instructions (Signed)
No Q Tips.  Debrox (Over the counter) as needed.  Take care  Dr. Adriana Simasook

## 2018-02-08 NOTE — ED Provider Notes (Signed)
[facility]    CSN: [id number] Arrival date & time: 02/08/18  1014  History   Chief Complaint Chief Complaint  Patient presents with  . Foreign Body in Ear   HPI  67 year old male presents with a foreign body in his left ear.  Patient states that he used a Q-tip in his ear last night after he took a shower.  He states that he removed the Q-tip and the cotton swab was not on the end of it.  He states that his ear has been bothering him since then.  Moderate pain as of last night.  Has persisted.  He believes that he has the cotton still stuck in his ears.  No hearing loss.  No drainage from the ears.  No other associated symptoms.  No other complaints.  Allergies   Mangifera indica; Other; and Codeine  Review of Systems Review of Systems  Constitutional: Negative.   HENT: Positive for ear pain.        Foreign body, left ear.   Physical Exam Triage Vital Signs ED Triage Vitals  Enc Vitals Group     BP 02/08/18 1027 105/75     Pulse Rate 02/08/18 1027 73     Resp 02/08/18 1027 18     Temp 02/08/18 1027 98 F (36.7 C)     Temp Source 02/08/18 1027 Oral     SpO2 02/08/18 1027 97 %     Weight 02/08/18 1030 260 lb (117.9 kg)     Height --      Head Circumference --      Peak Flow --      Pain Score 02/08/18 1030 0     Pain Loc --      Pain Edu? --      Excl. in GC? --    Updated Vital Signs BP 105/75 (BP Location: Left Arm)   Pulse 73   Temp 98 F (36.7 C) (Oral)   Resp 18   Wt 260 lb (117.9 kg)   SpO2 97%   BMI 39.53 kg/m   Visual Acuity Right Eye Distance:   Left Eye Distance:   Bilateral Distance:    Right Eye Near:   Left Eye Near:    Bilateral Near:     Physical Exam  Constitutional: He is oriented to person, place, and time. He appears well-developed. No distress.  HENT:  Head: Normocephalic and atraumatic.  Left ear -cotton noted in the ear.  Patient has a small area of irritation.  Normal-appearing TM.  Pulmonary/Chest: Effort  normal. No respiratory distress.  Neurological: He is alert and oriented to person, place, and time.  Psychiatric: He has a normal mood and affect. His behavior is normal.  Nursing note and vitals reviewed.  UC Treatments / Results  Labs (all labs ordered are listed, but only abnormal results are displayed) Labs Reviewed - No data to display  EKG None  Radiology No results found.  Procedures Procedures (including critical care time) Alligator forceps used to grasp and remove cotton.  Tolerated without difficulty.  Medications Ordered in UC Medications - No data to display  Initial Impression / Assessment and Plan / UC Course  I have reviewed the triage vital signs and the nursing notes.  Pertinent labs & imaging results that were available during my care of the patient were reviewed by me and considered in my medical decision making (see chart for details).    67 year old male presents with a foreign body in his  left ear.  Removed with alligator forceps without difficulty today.  Final Clinical Impressions(s) / UC Diagnoses   Final diagnoses:  Foreign body of left ear, initial encounter     Discharge Instructions     No Q Tips.  Debrox (Over the counter) as needed.  Take care  Dr. Adriana Simasook    ED Prescriptions    None     Controlled Substance Prescriptions [place] Controlled Substance Registry consulted? Not Applicable   Tommie SamsCook, Amaan Meyer G, DO 02/08/18 1108

## 2018-02-08 NOTE — ED Triage Notes (Signed)
Pt here for Q tip stuck in his left ear after his shower last night. Did cause ear pain last night and still in place as far as he knows.

## 2018-02-10 ENCOUNTER — Encounter: Payer: Medicare Other | Admitting: *Deleted

## 2018-02-10 DIAGNOSIS — I5022 Chronic systolic (congestive) heart failure: Secondary | ICD-10-CM

## 2018-02-10 NOTE — Progress Notes (Signed)
Daily Session Note  Patient Details  Name: Kevin Pearson MRN: [id number] Date of Birth: [birth year] Referring Provider:     Cardiac Rehab from 12/02/2017 in [facility] Cardiac and Pulmonary Rehab  Referring Provider  Ida Rogue MD      Encounter Date: 02/10/2018  Check In: Session Check In - 02/10/18 0920      Check-In   Supervising physician immediately available to respond to emergencies  See telemetry face sheet for immediately available ER MD    Location  [facility]-Cardiac & Pulmonary Rehab    Staff Present  Alberteen Sam, MA, RCEP, CCRP, Exercise Physiologist;Amanda Oletta Darter, BA, ACSM CEP, Exercise Physiologist;Meredith Sherryll Burger, RN BSN;Mandi Ballard, BS, PEC    Medication changes reported      No    Fall or balance concerns reported     No    Warm-up and Cool-down  Performed on first and last piece of equipment    Resistance Training Performed  Yes    VAD Patient?  No    PAD/SET Patient?  No      Pain Assessment   Currently in Pain?  No/denies          Social History   Tobacco Use  Smoking Status Former Smoker  . Packs/day: 1.00  . Years: 40.00  . Pack years: 40.00  . Types: Cigarettes  . Last attempt to quit: 08/2015  . Years since quitting: 2.5  Smokeless Tobacco Never Used    Goals Met:  Independence with exercise equipment Exercise tolerated well No report of cardiac concerns or symptoms Strength training completed today  Goals Unmet:  Not Applicable  Comments: Pt able to follow exercise prescription today without complaint.  Will continue to monitor for progression.    Dr. Emily Filbert is Medical Director for [place] and [name].

## 2018-02-16 ENCOUNTER — Encounter: Payer: Self-pay | Admitting: *Deleted

## 2018-02-16 DIAGNOSIS — I5022 Chronic systolic (congestive) heart failure: Secondary | ICD-10-CM

## 2018-02-16 NOTE — Progress Notes (Signed)
Cardiac Individual Treatment Plan  Patient Details  Name: Kevin Pearson MRN: [id number] Date of Birth: Mar 19, 1951 Referring Provider:     Cardiac Rehab from 12/02/2017 in [facility]  Referring Provider  Ida Rogue MD      Initial Encounter Date:    Cardiac Rehab from 12/02/2017 in [facility] Cardiac [facility] Pulmonary Rehab  Date  12/02/17      Visit Diagnosis: Heart failure, chronic systolic ([place])  Patient's Home Medications on Admission:  Current Outpatient Medications:  .  acetaminophen (TYLENOL) 325 MG tablet, Take 650 mg by mouth every 6 (six) hours as needed. , Disp: , Rfl:  .  albuterol (PROVENTIL HFA;VENTOLIN HFA) 108 (90 Base) MCG/ACT inhaler, Inhale 2 puffs into the lungs every 6 (six) hours as needed for wheezing or shortness of breath., Disp: 1 Inhaler, Rfl: 2 .  budesonide (PULMICORT) 0.5 MG/2ML nebulizer solution, Inhale 2 mLs into the lungs daily as needed. , Disp: , Rfl: 1 .  busPIRone (BUSPAR) 10 MG tablet, Take 10 mg by mouth 3 (three) times daily., Disp: , Rfl:  .  clopidogrel (PLAVIX) 75 MG tablet, Take 75 mg by mouth daily., Disp: , Rfl:  .  divalproex (DEPAKOTE) 500 MG DR tablet, Take 500 mg by mouth 2 (two) times daily. , Disp: , Rfl:  .  Elastic Bandages & Supports (T.E.D. BELOW KNEE/X-LARGE) MISC, 1 application by Does not apply route daily., Disp: 6 each, Rfl: 0 .  gabapentin (NEURONTIN) 300 MG capsule, Take 600 mg by mouth 2 (two) times daily. , Disp: , Rfl:  .  ipratropium-albuterol (DUONEB) 0.5-2.5 (3) MG/3ML SOLN, Take 3 mLs by nebulization every 6 (six) hours as needed (wheezing/shortness of breath)., Disp: , Rfl:  .  isosorbide mononitrate (IMDUR) 30 MG 24 hr tablet, Take 30 mg by mouth daily., Disp: , Rfl:  .  losartan (COZAAR) 25 MG tablet, Take 1 tablet (25 mg total) by mouth daily., Disp: 30 tablet, Rfl: 11 .  Melatonin 5 MG TABS, Take 5 mg by mouth at bedtime., Disp: , Rfl:  .  metoprolol succinate (TOPROL XL) 25 MG 24 hr tablet,  Take 1 tablet (25 mg total) by mouth daily., Disp: 90 tablet, Rfl: 3 .  nitroGLYCERIN (NITROSTAT) 0.4 MG SL tablet, Place 0.4 mg under the tongue every 5 (five) minutes as needed for chest pain., Disp: , Rfl:  .  omeprazole (PRILOSEC) 40 MG capsule, Take 40 mg by mouth daily., Disp: , Rfl:  .  rivaroxaban (XARELTO) 20 MG TABS tablet, Take 20 mg by mouth at bedtime., Disp: , Rfl:  .  rosuvastatin (CRESTOR) 20 MG tablet, Take 20 mg by mouth daily., Disp: , Rfl:  .  senna (SENOKOT) 8.6 MG tablet, Take 2 tablets by mouth as needed. , Disp: , Rfl:  .  spironolactone (ALDACTONE) 25 MG tablet, Take 0.5 tablets (12.5 mg total) by mouth daily., Disp: 45 tablet, Rfl: 3 .  tamsulosin (FLOMAX) 0.4 MG CAPS capsule, TAKE ONE CAPSULE BY MOUTH EVERY DAY AFTER SUPPER, Disp: 30 capsule, Rfl: 11 .  torsemide (DEMADEX) 20 MG tablet, 2 tablets (40 mg) in the AM and 1 tablet (20 mg) at lunch., Disp: 270 tablet, Rfl: 3 .  traZODone (DESYREL) 50 MG tablet, Take 100 mg by mouth at bedtime., Disp: , Rfl:  .  vortioxetine HBr (TRINTELLIX) 5 MG TABS, Take 5 mg by mouth daily. , Disp: , Rfl:   Past Medical History: Past Medical History:  Diagnosis Date  . Acid reflux   .  Anxiety   . Arthritis   . Atrial fibrillation ([place])   . CHF (congestive heart failure) ([place])   . Chronic orthostatic hypotension   . Clotting disorder ([place])   . COPD (chronic obstructive pulmonary disease) ([place])   . Depression   . Elevated PSA   . Heart attack ([place])   . Heart disease   . Heart failure ([place])   . Hepatitis   . High cholesterol   . Hypertension   . Sleep apnea   . TBI (traumatic brain injury) ([place])   . Urinary retention     Tobacco Use: Social History   Tobacco Use  Smoking Status Former Smoker  . Packs/day: 1.00  . Years: 40.00  . Pack years: 40.00  . Types: Cigarettes  . Last attempt to quit: 08/2015  . Years since quitting: 2.5  Smokeless Tobacco Never Used    Labs: Recent Review [facility] for ITP  Cardiac and Pulmonary Rehab Latest Ref Rng & Units 11/23/2017   Cholestrol 100 - 199 mg/dL 114   LDLCALC 0 - 99 mg/dL 60   HDL >39 mg/dL 30(L)   Trlycerides 0 - 149 mg/dL 119       Exercise Target Goals: Exercise Program Goal: Individual exercise prescription set using results from initial 6 min walk test and THRR while considering  patient's activity barriers and safety.   Exercise Prescription Goal: Initial exercise prescription builds to 30-45 minutes a day of aerobic activity, 2-3 days per week.  Home exercise guidelines will be given to patient during program as part of exercise prescription that the participant will acknowledge.  Activity Barriers & Risk Stratification: Activity Barriers & Cardiac Risk Stratification - 12/02/17 1054      Activity Barriers & Cardiac Risk Stratification   Activity Barriers  Deconditioning;Muscular Weakness;Shortness of Breath;Back Problems;Joint Problems;Decreased Ventricular Function;Balance Concerns;History of Falls;Neck/Spine Problems    Cardiac Risk Stratification  High       6 Minute Walk: 6 Minute Walk    Row Name 12/02/17 1052         6 Minute Walk   Phase  Initial Using Pulmonary Admission Test from 09/20/17     Distance  1002 feet     Distance % Change  23.8 %     Walk Time  6 minutes     # of Rest Breaks  0     MPH  1.9     METS  1.97     RPE  15     Perceived Dyspnea   2     VO2 Peak  6.88     Symptoms  No     Resting HR  70 bpm     Resting BP  128/56     Resting Oxygen Saturation   99 %     Exercise Oxygen Saturation  during 6 min walk  97 %     Max Ex. HR  104 bpm     Max Ex. BP  136/84     2 Minute Post BP  134/74       Interval HR   1 Minute HR  92     2 Minute HR  91     3 Minute HR  93     4 Minute HR  89     5 Minute HR  97     6 Minute HR  104     2 Minute Post HR  72  Interval Heart Rate?  Yes       Interval Oxygen   Interval Oxygen?  Yes     Baseline Oxygen Saturation %  99 %     1 Minute  Oxygen Saturation %  97 %     1 Minute Liters of Oxygen  0 L Room Air     2 Minute Oxygen Saturation %  97 %     2 Minute Liters of Oxygen  0 L     3 Minute Oxygen Saturation %  97 %     3 Minute Liters of Oxygen  0 L     4 Minute Oxygen Saturation %  98 %     4 Minute Liters of Oxygen  0 L     5 Minute Oxygen Saturation %  97 %     5 Minute Liters of Oxygen  0 L     6 Minute Oxygen Saturation %  97 %     6 Minute Liters of Oxygen  0 L     2 Minute Post Oxygen Saturation %  99 %     2 Minute Post Liters of Oxygen  0 L        Oxygen Initial Assessment:   Oxygen Re-Evaluation:   Oxygen Discharge (Final Oxygen Re-Evaluation):   Initial Exercise Prescription: Initial Exercise Prescription - 12/02/17 1000      Date of Initial Exercise RX and Referring Provider   Date  12/02/17    Referring Provider  Ida Rogue MD      Treadmill   MPH  2    Grade  0    Minutes  15    METs  2.53      NuStep   Level  3    SPM  80    Minutes  15    METs  2.3      Arm Ergometer   Level  1    RPM  50    Minutes  15    METs  2      Prescription Details   Frequency (times per week)  3    Duration  Progress to 45 minutes of aerobic exercise without signs/symptoms of physical distress      Intensity   THRR 40-80% of Max Heartrate  104-138    Ratings of Perceived Exertion  11-13    Perceived Dyspnea  0-4      Progression   Progression  Continue to progress workloads to maintain intensity without signs/symptoms of physical distress.      Resistance Training   Training Prescription  Yes    Weight  4 lbs    Reps  10-15       Perform Capillary Blood Glucose checks as needed.  Exercise Prescription Changes: Exercise Prescription Changes    Row Name 12/23/17 0900 12/29/17 1500 01/14/18 0800 01/26/18 0900 02/08/18 1500     Response to Exercise   Blood Pressure (Admit)  -  146/78  122/84  118/72  132/72   Blood Pressure (Exercise)  -  132/60  144/88  136/70  140/74   Blood  Pressure (Exit)  -  112/74  118/78  128/72  114/68   Heart Rate (Admit)  -  73 bpm  72 bpm  87 bpm  61 bpm   Heart Rate (Exercise)  -  112 bpm  95 bpm  112 bpm  107 bpm   Heart Rate (Exit)  -  74 bpm  63 bpm  74 bpm  70 bpm   Perceived Dyspnea (Exercise)  -  '13  11  12  13   '$ Symptoms  -  none  none  none  none   Duration  -  Continue with 30 min of aerobic exercise without signs/symptoms of physical distress.  Continue with 30 min of aerobic exercise without signs/symptoms of physical distress.  Continue with 30 min of aerobic exercise without signs/symptoms of physical distress.  Continue with 30 min of aerobic exercise without signs/symptoms of physical distress.   Intensity  -  THRR unchanged  THRR unchanged  THRR unchanged  THRR unchanged     Progression   Progression  -  Continue to progress workloads to maintain intensity without signs/symptoms of physical distress.  Continue to progress workloads to maintain intensity without signs/symptoms of physical distress.  Continue to progress workloads to maintain intensity without signs/symptoms of physical distress.  Continue to progress workloads to maintain intensity without signs/symptoms of physical distress.   Average METs  -  2.25  2.25  1.93  2.34     Resistance Training   Training Prescription  -  Yes  Yes  Yes  Yes   Weight  -  4 lbs  4 lb  4 lbs  4 lbs   Reps  -  10-15  10-15  10-15  -     Interval Training   Interval Training  -  No  No  No  -     Treadmill   MPH  -  -  -  1  -   Minutes  -  -  -  15  -   METs  -  -  -  1.77  -     NuStep   Level  -  '6  4  4  '$ -   Minutes  -  '15  15  15  '$ -   METs  -  3  -  2.1  -     Arm Ergometer   Level  -  1  -  -  -   Minutes  -  15  -  -  -   METs  -  1.9  -  -  -     Home Exercise Plan   Plans to continue exercise at  [employer] (comment) water aerobics at [employer] (comment) water aerobics at [employer] (comment) water  aerobics at [employer] (comment) water aerobics at [place] 2 additional days to program exercise sessions.  Add 2 additional days to program exercise sessions.  Add 2 additional days to program exercise sessions.  Add 2 additional days to program exercise sessions.  -   Initial Home Exercises Provided  09/29/17  09/29/17  09/29/17  09/29/17  -      Exercise Comments: Exercise Comments    Row Name 12/02/17 [phone]           Exercise Comments  First full day of exercise!  Patient was oriented to gym and equipment including functions, settings, policies, and procedures.  Patient's individual exercise prescription and treatment plan were reviewed.  All starting workloads were established based on the results of the 6 minute walk test done at initial orientation visit.  The plan for exercise progression was also introduced and progression will be customized based on patient's performance and goals.  Exercise Goals and Review: Exercise Goals    Row Name 12/02/17 1059             Exercise Goals   Increase Physical Activity  Yes       Intervention  Provide advice, education, support and counseling about physical activity/exercise needs.;Develop an individualized exercise prescription for aerobic and resistive training based on initial evaluation findings, risk stratification, comorbidities and participant's personal goals.       Expected Outcomes  Short Term: Attend rehab on a regular basis to increase amount of physical activity.;Long Term: Add in home exercise to make exercise part of routine and to increase amount of physical activity.;Long Term: Exercising regularly at least 3-5 days a week.       Increase Strength and Stamina  Yes       Intervention  Provide advice, education, support and counseling about physical activity/exercise needs.;Develop an individualized exercise prescription for aerobic and resistive training based on  initial evaluation findings, risk stratification, comorbidities and participant's personal goals.       Expected Outcomes  Short Term: Increase workloads from initial exercise prescription for resistance, speed, and METs.;Short Term: Perform resistance training exercises routinely during rehab and add in resistance training at home;Long Term: Improve cardiorespiratory fitness, muscular endurance and strength as measured by increased METs and functional capacity (6MWT)       Able to understand and use rate of perceived exertion (RPE) scale  Yes       Intervention  Provide education and explanation on how to use RPE scale       Expected Outcomes  Short Term: Able to use RPE daily in rehab to express subjective intensity level;Long Term:  Able to use RPE to guide intensity level when exercising independently       Able to understand and use Dyspnea scale  Yes       Intervention  Provide education and explanation on how to use Dyspnea scale       Expected Outcomes  Short Term: Able to use Dyspnea scale daily in rehab to express subjective sense of shortness of breath during exertion;Long Term: Able to use Dyspnea scale to guide intensity level when exercising independently       Knowledge and understanding of Target Heart Rate Range (THRR)  Yes       Intervention  Provide education and explanation of THRR including how the numbers were predicted and where they are located for reference       Expected Outcomes  Short Term: Able to state/look up THRR;Long Term: Able to use THRR to govern intensity when exercising independently;Short Term: Able to use daily as guideline for intensity in rehab       Able to check pulse independently  Yes       Intervention  Provide education and demonstration on how to check pulse in carotid and radial arteries.;Review the importance of being able to check your own pulse for safety during independent exercise       Expected Outcomes  Short Term: Able to explain why pulse  checking is important during independent exercise;Long Term: Able to check pulse independently and accurately       Understanding of Exercise Prescription  Yes       Intervention  Provide education, explanation, and written materials on patient's individual exercise prescription       Expected Outcomes  Short Term: Able to explain program exercise prescription;Long Term: Able to explain home exercise prescription to exercise independently  Exercise Goals Re-Evaluation : Exercise Goals Re-Evaluation    Row Name 12/02/17 0834 12/14/17 1428 12/23/17 0940 12/29/17 1523 01/14/18 0810     Exercise Goal Re-Evaluation   Exercise Goals Review  Increase Physical Activity;Increase Strength and Stamina;Able to understand and use rate of perceived exertion (RPE) scale  -  Increase Physical Activity;Able to understand and use rate of perceived exertion (RPE) scale;Knowledge and understanding of Target Heart Rate Range (THRR);Understanding of Exercise Prescription  Increase Physical Activity;Increase Strength and Stamina;Understanding of Exercise Prescription  Increase Physical Activity;Increase Strength and Stamina;Able to understand and use rate of perceived exertion (RPE) scale   Comments  Reviewed RPE scale, THR and program prescription with pt today.  Pt voiced understanding and was given a copy of goals to take home.   Out since last review  Reviewed home exercise again to update for cardiac sessions.   Duval has been doing well in rehab.  He is still on level 1 for the arm crank, but he forgets to try to make progression.   He has been avoiding the treadmill and we will make sure that he is able to get back on there.  We will continue to work on his progress and reminding him to advance his workloads.    Pt tolerates exercise well. Staff will encourage walking.     Expected Outcomes  Short: Use RPE daily to regulate intensity.  Long: Follow program prescription in THR.  -  Short: Add back in home  exercise.  Long: Continue to increase phsyical activity  Short: Increase arm crank workloads.  Long: Continue to exercise at home.   Short - add TM back to sessions Long - increase MET level   Row Name 01/26/18 4888 02/01/18 0808 02/08/18 1511         Exercise Goal Re-Evaluation   Exercise Goals Review  Increase Physical Activity;Increase Strength and Stamina;Able to understand and use rate of perceived exertion (RPE) scale  Increase Physical Activity;Increase Strength and Stamina;Understanding of Exercise Prescription  Increase Physical Activity;Increase Strength and Stamina;Understanding of Exercise Prescription     Comments  Burney returned yesterday after being out for over a week.  He was able to increase his workload on NuStep to level 5.  We will continue to monitor his progress.   Raevon has been moving.  He has not been doing any walking other than moving. He has been doing wall push ups.  As things start to settle back into place, he is going to start getting back to his walking.  He belongs to [employer] and plans to get back in there.   Jedidiah has been out since last week.  He is still trying to move.   We will continue to encourage him to get back to [employer].  He is now on level 6 on the NuStep.  We will continue to monitor his progress.      Expected Outcomes  Short: Continue to work on increasing more workloads.  Long: Continue to increase strength and stamina.   Short: Get back to home exercise routine.  Long: Continue to work on increasing physcial activity.   Short: Continue to increase workloads and attend regularly  Long: Continue to increase strength and stamina        Discharge Exercise Prescription (Final Exercise Prescription Changes): Exercise Prescription Changes - 02/08/18 1500      Response to Exercise   Blood Pressure (Admit)  132/72    Blood Pressure (Exercise)  140/74  Blood Pressure (Exit)  114/68    Heart Rate (Admit)  61 bpm    Heart Rate (Exercise)   107 bpm    Heart Rate (Exit)  70 bpm    Perceived Dyspnea (Exercise)  13    Symptoms  none    Duration  Continue with 30 min of aerobic exercise without signs/symptoms of physical distress.    Intensity  THRR unchanged      Progression   Progression  Continue to progress workloads to maintain intensity without signs/symptoms of physical distress.    Average METs  2.34      Resistance Training   Training Prescription  Yes    Weight  4 lbs       Nutrition:  Target Goals: Understanding of nutrition guidelines, daily intake of sodium '1500mg'$ , cholesterol '200mg'$ , calories 30% from fat and 7% or less from saturated fats, daily to have 5 or more servings of fruits and vegetables.  Biometrics: Pre Biometrics - 12/02/17 1101      Pre Biometrics   Height  5' 7.4" (1.712 m)    Weight  256 lb 12.8 oz (116.5 kg)    Waist Circumference  45 inches    Hip Circumference  46 inches    Waist to Hip Ratio  0.98 %    BMI (Calculated)  39.74        Nutrition Therapy Plan and Nutrition Goals:   Nutrition Assessments: Nutrition Assessments - 12/02/17 1124      MEDFICTS Scores   Pre Score  67       Nutrition Goals Re-Evaluation: Nutrition Goals Re-Evaluation    Row Name 01/13/18 0811 02/01/18 0821 02/03/18 0920         Goals   Nutrition Goal  Look for ways to include additional servings of vegetables in your daily diet. Steaming, sauteeing, grilling, and roasting are all great cooking methods for them.  Continue to work on portion control and don't skip meals.   Continue to work on eating on a regular schedule, trying not to skip meals     Comment  He has been trying to include more vegetables in his diet, he is hit or miss wth this. He sees a RD every month  He has had a lot going on.  He has started to skip some meals.  He continues to see his nutritionist every month.    He continues to have an irregular eating schedule d/t current health, family and personal responsibilities and  concerns     Expected Outcome  He will include servings of vegetables daily when planning meals work on portion control.  Short: Continue to work on portion control.  Long: Don't skip meals and eat more at home.   He will eat regularly throughout the day, keeping sodium intake and portion control in mind       Personal Goal #2 Re-Evaluation   Personal Goal #2  Pay closer attention to portion sizes at meal times, particularly starchy foods. Attempting to work on this. Cooks at home eats out once a week.  -  Weight reduction; he is considering joining [employer] once he moves, and joining [employer] and times for nearby locations provided        Nutrition Goals Discharge (Final Nutrition Goals Re-Evaluation): Nutrition Goals Re-Evaluation - 02/03/18 0920      Goals   Nutrition Goal  Continue to work on eating on a regular schedule, trying not to skip meals    Comment  He continues to have an irregular eating schedule d/t current health, family and personal responsibilities and concerns    Expected Outcome  He will eat regularly throughout the day, keeping sodium intake and portion control in mind      Personal Goal #2 Re-Evaluation   Personal Goal #2  Weight reduction; he is considering joining [employer] once he moves, and joining [employer] and times for nearby locations provided       Psychosocial: Target Goals: Acknowledge presence or absence of significant depression and/or stress, maximize coping skills, provide positive support system. Participant is able to verbalize types and ability to use techniques and skills needed for reducing stress and depression.   Initial Review & Psychosocial Screening:   Quality of Life Scores:  Quality of Life - 12/02/17 1116      Quality of Life Scores   Health/Function Pre  7.8 %    Socioeconomic Pre  16.83 %    Psych/Spiritual Pre  19.64 %    Family Pre  15.75 %    GLOBAL Pre  13.08 %      Scores of 19 and below usually indicate  a poorer quality of life in these areas.  A difference of  2-3 points is a clinically meaningful difference.  A difference of 2-3 points in the total score of the Quality of Life Index has been associated with significant improvement in overall quality of life, self-image, physical symptoms, and general health in studies assessing change in quality of life.  PHQ-9: Recent Review Flowsheet Data    Depression screen [facility] 2/9 12/27/2017 12/02/2017 10/06/2017 09/23/2017 09/20/2017   Decreased Interest 0 2 2 - 3   Down, Depressed, Hopeless 0 '2 1 1 2   '$ PHQ - 2 Score 0 '4 3 1 5   '$ Altered sleeping - 1 2 - 2   Tired, decreased energy - 1 2 - 2   Change in appetite - 1 2 - 3   Feeling bad or failure about yourself  - 1 2 - 2   Trouble concentrating - 1 1 - 1   Moving slowly or fidgety/restless - 1 1 - 1   Suicidal thoughts - 0 1 0 1    PHQ-9 Score - 10 14 - 17   Difficult doing work/chores - Somewhat difficult - - Very difficult     Interpretation of Total Score  Total Score Depression Severity:  1-4 = Minimal depression, 5-9 = Mild depression, 10-14 = Moderate depression, 15-19 = Moderately severe depression, 20-27 = Severe depression   Psychosocial Evaluation and Intervention: Psychosocial Evaluation - 12/28/17 0939      Psychosocial Evaluation & Interventions   Comments  Mr. Thad Osoria) returned to Cardiac Rehab due to increased pulmonary and cardiac difficulties.  Counselor met with him today for updated psychosocial evaluation.  He is 67 years old and has a limited support system currently - reporting his daughter wants to "put him in a nursing home."  He is sleeping better now with 6-7 hours with the use of medication and his appetite is good.  Surafel reports a history of depression and anxiety and states his current medications for this are working well as his mood is generally positive and less irritable.  Stress in his life are his health and family conflict.  He has goals to increase his  ability to breathe and be able to do more activities.  He will be followed by staff.      Expected Outcomes  Short:  Brennin will improve his breathing with exercise.   Long:  Foy will cope more positively with skills learned and increase his support system for more positive interactions.      Continue Psychosocial Services   Follow up required by staff       Psychosocial Re-Evaluation: Psychosocial Re-Evaluation    [place] Name 01/13/18 0814 02/01/18 0817           Psychosocial Re-Evaluation   Current issues with  Current Sleep Concerns  Current Stress Concerns      Comments  -  Feliz has been out with a death in his family and dealing with the fall out from that.  He has also been working on moving as well. He will need to follow up with counselor.  He has also had his dog sick.   He is also toying with the idea about moving his mother hear.       Expected Outcomes  -  Short: Meet with Juliann Pulse.  Settle into new home.  Long: Continue to work on Radiographer, therapeutic.      Interventions  -  Stress management education;Encouraged to attend Cardiac Rehabilitation for the exercise      Continue Psychosocial Services   -  Follow up required by counselor      Comments  -  He uses medication to help him sleep and sees a psychologist.        Initial Review   Source of Stress Concerns  -  Chronic Illness;Unable to participate in former interests or hobbies;Family;Financial         Psychosocial Discharge (Final Psychosocial Re-Evaluation): Psychosocial Re-Evaluation - 02/01/18 0817      Psychosocial Re-Evaluation   Current issues with  Current Stress Concerns    Comments  Courtez has been out with a death in his family and dealing with the fall out from that.  He has also been working on moving as well. He will need to follow up with counselor.  He has also had his dog sick.   He is also toying with the idea about moving his mother hear.     Expected Outcomes  Short: Meet with Juliann Pulse.  Settle into new  home.  Long: Continue to work on Radiographer, therapeutic.    Interventions  Stress management education;Encouraged to attend Cardiac Rehabilitation for the exercise    Continue Psychosocial Services   Follow up required by counselor    Comments  He uses medication to help him sleep and sees a psychologist.      Initial Review   Source of Stress Concerns  Chronic Illness;Unable to participate in former interests or hobbies;Family;Financial       Vocational Rehabilitation: Provide vocational rehab assistance to qualifying candidates.   Vocational Rehab Evaluation & Intervention:   Education: Education Goals: Education classes will be provided on a variety of topics geared toward better understanding of heart health and risk factor modification. Participant will state understanding/return demonstration of topics presented as noted by education test scores.  Learning Barriers/Preferences: Learning Barriers/Preferences - 12/02/17 1109      Learning Barriers/Preferences   Learning Barriers  Hearing;Sight    Learning Preferences  Skilled Demonstration;Written Material;Computer/Internet;Group Instruction;Verbal Instruction       Education Topics:  AED/CPR: - Group verbal and written instruction with the use of models to demonstrate the basic use of the AED with the basic ABC's of resuscitation.   General Nutrition Guidelines/Fats and Fiber: -Group instruction provided by verbal, written material, models  and posters to present the general guidelines for heart healthy nutrition. Gives an explanation and review of dietary fats and fiber.   Cardiac Rehab from 02/10/2018 in [facility] Cardiac and Pulmonary Rehab  Date  09/27/17  Educator  CR  Instruction Review Code  1- Verbalizes Understanding      Controlling Sodium/Reading Food Labels: -Group verbal and written material supporting the discussion of sodium use in heart healthy nutrition. Review and explanation with models, verbal and written materials  for utilization of the food label.   Cardiac Rehab from 02/10/2018 in [facility] Cardiac and Pulmonary Rehab  Date  01/25/18  Educator  SB  Instruction Review Code  1- Verbalizes Understanding      Exercise Physiology & General Exercise Guidelines: - Group verbal and written instruction with models to review the exercise physiology of the cardiovascular system and associated critical values. Provides general exercise guidelines with specific guidelines to those with heart or lung disease.    Cardiac Rehab from 02/10/2018 in [facility] Cardiac and Pulmonary Rehab  Date  02/01/18  Educator  [facility]  Instruction Review Code  1- Verbalizes Understanding      Aerobic Exercise & Resistance Training: - Gives group verbal and written instruction on the various components of exercise. Focuses on aerobic and resistive training programs and the benefits of this training and how to safely progress through these programs..   Cardiac Rehab from 02/10/2018 in [facility] Cardiac and Pulmonary Rehab  Date  02/10/18  Educator  AS  Instruction Review Code  1- Verbalizes Understanding      Flexibility, Balance, Mind/Body Relaxation: Provides group verbal/written instruction on the benefits of flexibility and balance training, including mind/body exercise modes such as yoga, pilates and tai chi.  Demonstration and skill practice provided.   Cardiac Rehab from 02/10/2018 in [facility] Cardiac and Pulmonary Rehab  Date  12/23/17  Educator  AS  Instruction Review Code  1- Verbalizes Understanding      Stress and Anxiety: - Provides group verbal and written instruction about the health risks of elevated stress and causes of high stress.  Discuss the correlation between heart/lung disease and anxiety and treatment options. Review healthy ways to manage with stress and anxiety.   Cardiac Rehab from 10/27/2016 in [facility] Cardiac and Pulmonary Rehab  Date  08/13/16  Educator  TS  Instruction Review Code (retired)  2- meets goals/outcomes       Depression: - Provides group verbal and written instruction on the correlation between heart/lung disease and depressed mood, treatment options, and the stigmas associated with seeking treatment.   Cardiac Rehab from 02/10/2018 in [facility] Cardiac and Pulmonary Rehab  Date  11/17/17  Educator  [facility]  Instruction Review Code  1- Verbalizes Understanding      Anatomy & Physiology of the Heart: - Group verbal and written instruction and models provide basic cardiac anatomy and physiology, with the coronary electrical and arterial systems. Review of Valvular disease and Heart Failure   Cardiac Rehab from 02/10/2018 in [facility] Cardiac and Pulmonary Rehab  Date  01/13/18  Educator  SB  Instruction Review Code  1- Verbalizes Understanding      Cardiac Procedures: - Group verbal and written instruction to review commonly prescribed medications for heart disease. Reviews the medication, class of the drug, and side effects. Includes the steps to properly store meds and maintain the prescription regimen. (beta blockers and nitrates)   Cardiac Rehab from 10/27/2016 in [facility] Cardiac and Pulmonary Rehab  Date  10/13/16  Educator  SB  Instruction Review Code (retired)  2- meets goals/outcomes      Cardiac Medications I: - Group verbal and written instruction to review commonly prescribed medications for heart disease. Reviews the medication, class of the drug, and side effects. Includes the steps to properly store meds and maintain the prescription regimen.   Cardiac Rehab from 02/10/2018 in [facility] Cardiac and Pulmonary Rehab  Date  12/28/17  Educator  SB  Instruction Review Code  1- Verbalizes Understanding      Cardiac Medications II: -Group verbal and written instruction to review commonly prescribed medications for heart disease. Reviews the medication, class of the drug, and side effects. (all other drug classes)   Cardiac Rehab from 02/10/2018 in [facility] Cardiac and Pulmonary Rehab  Date  12/16/17   Educator  CE  Instruction Review Code  1- Verbalizes Understanding       Go Sex-Intimacy & Heart Disease, Get SMART - Goal Setting: - Group verbal and written instruction through game format to discuss heart disease and the return to sexual intimacy. Provides group verbal and written material to discuss and apply goal setting through the application of the S.M.A.R.T. Method.   Cardiac Rehab from 10/27/2016 in [facility] Cardiac and Pulmonary Rehab  Date  10/13/16  Educator  SB  Instruction Review Code (retired)  2- meets goals/outcomes      Other Matters of the Heart: - Provides group verbal, written materials and models to describe Stable Angina and Peripheral Artery. Includes description of the disease process and treatment options available to the cardiac patient.   Cardiac Rehab from 02/10/2018 in [facility] Cardiac and Pulmonary Rehab  Date  01/13/18  Educator  SB  Instruction Review Code  1- Verbalizes Understanding      Exercise & Equipment Safety: - Individual verbal instruction and demonstration of equipment use and safety with use of the equipment.   Cardiac Rehab from 02/10/2018 in [facility] Cardiac and Pulmonary Rehab  Date  09/20/17  Educator  [facility]  Instruction Review Code  1- Verbalizes Understanding      Infection Prevention: - Provides verbal and written material to individual with discussion of infection control including proper hand washing and proper equipment cleaning during exercise session.   Cardiac Rehab from 02/10/2018 in [facility] Cardiac and Pulmonary Rehab  Date  09/20/17  Educator  [facility]  Instruction Review Code  1- Verbalizes Understanding      Falls Prevention: - Provides verbal and written material to individual with discussion of falls prevention and safety.   Cardiac Rehab from 02/10/2018 in [facility] Cardiac and Pulmonary Rehab  Date  09/20/17  Educator  [facility]  Instruction Review Code  1- Verbalizes Understanding      Diabetes: - Individual verbal and written instruction  to review signs/symptoms of diabetes, desired ranges of glucose level fasting, after meals and with exercise. Acknowledge that pre and post exercise glucose checks will be done for 3 sessions at entry of program.   Know Your Numbers and Risk Factors: -Group verbal and written instruction about important numbers in your health.  Discussion of what are risk factors and how they play a role in the disease process.  Review of Cholesterol, Blood Pressure, Diabetes, and BMI and the role they play in your overall health.   Cardiac Rehab from 02/10/2018 in [facility] Cardiac and Pulmonary Rehab  Date  12/16/17  Educator  CE  Instruction Review Code  1- Verbalizes Understanding      Sleep Hygiene: -Provides group verbal and written instruction  about how sleep can affect your health.  Define sleep hygiene, discuss sleep cycles and impact of sleep habits. Review good sleep hygiene tips.    Cardiac Rehab from 02/10/2018 in [facility] Cardiac and Pulmonary Rehab  Date  02/03/18  Educator  [facility]  Instruction Review Code  1- Verbalizes Understanding      Other: -Provides group and verbal instruction on various topics (see comments)   Knowledge Questionnaire Score: Knowledge Questionnaire Score - 12/02/17 1122      Knowledge Questionnaire Score   Pre Score  24/26   Focus for education Angina and Nutrition      Core Components/Risk Factors/Patient Goals at Admission: Personal Goals and Risk Factors at Admission - 12/02/17 1102      Core Components/Risk Factors/Patient Goals on Admission    Weight Management  Yes;Obesity;Weight Loss    Intervention  Weight Management: Develop a combined nutrition and exercise program designed to reach desired caloric intake, while maintaining appropriate intake of nutrient and fiber, sodium and fats, and appropriate energy expenditure required for the weight goal.;Weight Management: Provide education and appropriate resources to help participant work on and attain dietary  goals.;Weight Management/Obesity: Establish reasonable short term and long term weight goals.;Obesity: Provide education and appropriate resources to help participant work on and attain dietary goals.    Admit Weight  256 lb 12.8 oz (116.5 kg)    Goal Weight: Short Term  252 lb (114.3 kg)    Goal Weight: Long Term  180 lb (81.6 kg)    Expected Outcomes  Short Term: Continue to assess and modify interventions until short term weight is achieved;Long Term: Adherence to nutrition and physical activity/exercise program aimed toward attainment of established weight goal;Weight Loss: Understanding of general recommendations for a balanced deficit meal plan, which promotes 1-2 lb weight loss per week and includes a negative energy balance of [phone] kcal/d;Understanding recommendations for meals to include 15-35% energy as protein, 25-35% energy from fat, 35-60% energy from carbohydrates, less than '200mg'$  of dietary cholesterol, 20-35 gm of total fiber daily;Understanding of distribution of calorie intake throughout the day with the consumption of 4-5 meals/snacks    Improve shortness of breath with ADL's  Yes    Intervention  Provide education, individualized exercise plan and daily activity instruction to help decrease symptoms of SOB with activities of daily living.    Expected Outcomes  Short Term: Improve cardiorespiratory fitness to achieve a reduction of symptoms when performing ADLs;Long Term: Be able to perform more ADLs without symptoms or delay the onset of symptoms    Heart Failure  Yes    Intervention  Provide a combined exercise and nutrition program that is supplemented with education, support and counseling about heart failure. Directed toward relieving symptoms such as shortness of breath, decreased exercise tolerance, and extremity edema.    Expected Outcomes  Improve functional capacity of life;Short term: Attendance in program 2-3 days a week with increased exercise capacity. Reported lower  sodium intake. Reported increased fruit and vegetable intake. Reports medication compliance.;Short term: Daily weights obtained and reported for increase. Utilizing diuretic protocols set by physician.;Long term: Adoption of self-care skills and reduction of barriers for early signs and symptoms recognition and intervention leading to self-care maintenance.    Hypertension  Yes    Intervention  Provide education on lifestyle modifcations including regular physical activity/exercise, weight management, moderate sodium restriction and increased consumption of fresh fruit, vegetables, and low fat dairy, alcohol moderation, and smoking cessation.;Monitor prescription use compliance.    Expected  Outcomes  Short Term: Continued assessment and intervention until BP is < 140/43m HG in hypertensive participants. < 130/82mHG in hypertensive participants with diabetes, heart failure or chronic kidney disease.;Long Term: Maintenance of blood pressure at goal levels.    Lipids  Yes    Intervention  Provide education and support for participant on nutrition & aerobic/resistive exercise along with prescribed medications to achieve LDL '70mg'$ , HDL >'40mg'$ .    Expected Outcomes  Short Term: Participant states understanding of desired cholesterol values and is compliant with medications prescribed. Participant is following exercise prescription and nutrition guidelines.;Long Term: Cholesterol controlled with medications as prescribed, with individualized exercise RX and with personalized nutrition plan. Value goals: LDL < '70mg'$ , HDL > 40 mg.    Stress  Yes    Intervention  Offer individual and/or small group education and counseling on adjustment to heart disease, stress management and health-related lifestyle change. Teach and support self-help strategies.;Refer participants experiencing significant psychosocial distress to appropriate mental health specialists for further evaluation and treatment. When possible, include  family members and significant others in education/counseling sessions.    Expected Outcomes  Short Term: Participant demonstrates changes in health-related behavior, relaxation and other stress management skills, ability to obtain effective social support, and compliance with psychotropic medications if prescribed.;Long Term: Emotional wellbeing is indicated by absence of clinically significant psychosocial distress or social isolation.       Core Components/Risk Factors/Patient Goals Review:  Goals and Risk Factor Review    Row Name 02/01/18 0812             Core Components/Risk Factors/Patient Goals Review   Personal Goals Review  Weight Management/Obesity;Lipids;Hypertension;Heart Failure       Review  Milen's weight is up today to 262 lbs.  He has a lot of fluid on board.  He is working with his doctors to find a new balance.  He is having some swelling with this as well.  His kidney function is off.   His blood pressures have been good.  He does not check it at home as he doesn't like the cuff.  He is planning to get a new one.        Expected Outcomes  Short: Get new blood pressure cuff.  Long: Continue to work on heart failure and getting fluid off.           Core Components/Risk Factors/Patient Goals at Discharge (Final Review):  Goals and Risk Factor Review - 02/01/18 0812      Core Components/Risk Factors/Patient Goals Review   Personal Goals Review  Weight Management/Obesity;Lipids;Hypertension;Heart Failure    Review  Tevin's weight is up today to 262 lbs.  He has a lot of fluid on board.  He is working with his doctors to find a new balance.  He is having some swelling with this as well.  His kidney function is off.   His blood pressures have been good.  He does not check it at home as he doesn't like the cuff.  He is planning to get a new one.     Expected Outcomes  Short: Get new blood pressure cuff.  Long: Continue to work on heart failure and getting fluid off.         ITP Comments: ITP Comments    Row Name 12/02/17 1049 12/14/17 1428 12/22/17 0604 01/19/18 0545 02/01/18 0807   ITP Comments  CaRanitarted in Cardiac Rehab today.  Documentation for diagnosis can be found in [facility] enounter from 5/21 and  admission from 5/15.  New Cardaic ITP created and sent to Dr. Emily Filbert, Medical Director for review and signature.   Called to check on pt as he has been out since 12/29/2022.  He had a death in the family and needed to go to [place]. He also took time to visit with his family. 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  He has also been sick.  He is hoping to try to come in on Thursday.   30 day review. Continue with ITP unless directed changes per Medical Director review  30 day review. Continue with ITP unless directed changes per Medical Director review.   One session completed in July.  [place] of [place]  Shaquile was out with a death in the family. He moved from [facility] to [place]. Address updated   [place] Name 02/16/18 0809           ITP Comments  30 day review completed. ITP sent to Dr. Ramonita Lab, covering for Dr. Emily Filbert, Medical Director of Cardiac Rehab. Continue with ITP unless changes are made by physician          Comments: 30 day review

## 2018-02-22 ENCOUNTER — Encounter: Payer: Self-pay | Admitting: *Deleted

## 2018-02-22 ENCOUNTER — Telehealth: Payer: Self-pay | Admitting: *Deleted

## 2018-02-22 ENCOUNTER — Ambulatory Visit (INDEPENDENT_AMBULATORY_CARE_PROVIDER_SITE_OTHER): Payer: Medicare Other | Admitting: *Deleted

## 2018-02-22 ENCOUNTER — Telehealth: Payer: Self-pay | Admitting: Cardiovascular Disease

## 2018-02-22 DIAGNOSIS — I5022 Chronic systolic (congestive) heart failure: Secondary | ICD-10-CM

## 2018-02-22 NOTE — Progress Notes (Signed)
Remote ICD transmission.   

## 2018-02-22 NOTE — Telephone Encounter (Signed)
Called to check on pt.  He has been out with lots of things going on at home between moving and dealing with family.  He is hoping to return on Thursday.

## 2018-02-22 NOTE — Telephone Encounter (Signed)
[facility] wanted to let us know pt is enrolled with her in a heart failure program and wanted to leave her contact information if she has to reach out to us, we will be aware.

## 2018-02-22 NOTE — Telephone Encounter (Signed)
Noted  

## 2018-02-24 DIAGNOSIS — I5022 Chronic systolic (congestive) heart failure: Secondary | ICD-10-CM

## 2018-02-24 DIAGNOSIS — J449 Chronic obstructive pulmonary disease, unspecified: Secondary | ICD-10-CM

## 2018-02-24 NOTE — Progress Notes (Signed)
Daily Session Note  Patient Details  Name: Kevin Pearson MRN: [id number] Date of Birth: Mar 23, 1951 Referring Provider:     Cardiac Rehab from 12/02/2017 in [facility] Cardiac and Pulmonary Rehab  Referring Provider  Ida Rogue MD      Encounter Date: 02/24/2018  Check In: Session Check In - 02/24/18 0831      Check-In   Supervising physician immediately available to respond to emergencies  See telemetry face sheet for immediately available ER MD    Location  [facility]-Cardiac & Pulmonary Rehab    Staff Present  Joellyn Rued, BS, PEC;Carroll Enterkin, RN, BSN;Jessica [place], MA, RCEP, CCRP, Exercise Physiologist;Celicia Minahan Oletta Darter, BA, ACSM CEP, Exercise Physiologist    Medication changes reported      No    Fall or balance concerns reported     No    Warm-up and Cool-down  Performed on first and last piece of equipment    Resistance Training Performed  Yes    VAD Patient?  No    PAD/SET Patient?  No      Pain Assessment   Currently in Pain?  No/denies    Multiple Pain Sites  No          Social History   Tobacco Use  Smoking Status Former Smoker  . Packs/day: 1.00  . Years: 40.00  . Pack years: 40.00  . Types: Cigarettes  . Last attempt to quit: 08/2015  . Years since quitting: 2.5  Smokeless Tobacco Never Used    Goals Met:  Independence with exercise equipment Exercise tolerated well No report of cardiac concerns or symptoms Strength training completed today  Goals Unmet:  Not Applicable  Comments: Pt able to follow exercise prescription today without complaint.  Will continue to monitor for progression.    Dr. Emily Filbert is Medical Director for [place] [name].

## 2018-03-01 ENCOUNTER — Ambulatory Visit: Payer: BLUE CROSS/BLUE SHIELD | Admitting: Urology

## 2018-03-01 ENCOUNTER — Encounter: Payer: Medicare Other | Admitting: *Deleted

## 2018-03-01 DIAGNOSIS — J449 Chronic obstructive pulmonary disease, unspecified: Secondary | ICD-10-CM

## 2018-03-01 DIAGNOSIS — I5022 Chronic systolic (congestive) heart failure: Secondary | ICD-10-CM | POA: Diagnosis not present

## 2018-03-01 NOTE — Progress Notes (Signed)
Daily Session Note  Patient Details  Name: Kevin Pearson MRN: [id number] Date of Birth: [birth year] Referring Provider:     Cardiac Rehab from 12/02/2017 in [facility] Cardiac and Pulmonary Rehab  Referring Provider  Ida Rogue MD      Encounter Date: 03/01/2018  Check In: Session Check In - 03/01/18 0825      Check-In   Supervising physician immediately available to respond to emergencies  See telemetry face sheet for immediately available ER MD    Location  [facility]-Cardiac & Pulmonary Rehab    Staff Present  Joellyn Rued, BS, PEC;Susanne Bice, RN, BSN, CCRP;Jessica [place], MA, RCEP, CCRP, Exercise Physiologist;Rich Paprocki [place], BA, ACSM CEP, Exercise Physiologist    Medication changes reported      No    Fall or balance concerns reported     No    Tobacco Cessation  No Change    Warm-up and Cool-down  Performed on first and last piece of equipment    Resistance Training Performed  Yes    VAD Patient?  No    PAD/SET Patient?  No      Pain Assessment   Currently in Pain?  No/denies    Pain Score  0-No pain    Multiple Pain Sites  No          Social History   Tobacco Use  Smoking Status Former Smoker  . Packs/day: 1.00  . Years: 40.00  . Pack years: 40.00  . Types: Cigarettes  . Last attempt to quit: 08/2015  . Years since quitting: 2.5  Smokeless Tobacco Never Used    Goals Met:  Independence with exercise equipment Exercise tolerated well No report of cardiac concerns or symptoms Strength training completed today  Goals Unmet:  Not Applicable  Comments: Pt able to follow exercise prescription today without complaint.  Will continue to monitor for progression.    Dr. Emily Filbert is Medical Director for [place] [name].

## 2018-03-03 ENCOUNTER — Encounter: Payer: Medicare Other | Admitting: *Deleted

## 2018-03-03 DIAGNOSIS — J449 Chronic obstructive pulmonary disease, unspecified: Secondary | ICD-10-CM

## 2018-03-03 DIAGNOSIS — I5022 Chronic systolic (congestive) heart failure: Secondary | ICD-10-CM

## 2018-03-03 NOTE — Progress Notes (Signed)
Daily Session Note  Patient Details  Name: Daymian Lill MRN: [id number] Date of Birth: [birth year] Referring Provider:     Cardiac Rehab from 12/02/2017 in [facility] Cardiac and Pulmonary Rehab  Referring Provider  Ida Rogue MD      Encounter Date: 03/03/2018  Check In: Session Check In - 03/03/18 0807      Check-In   Supervising physician immediately available to respond to emergencies  See telemetry face sheet for immediately available ER MD    Location  [facility]-Cardiac & Pulmonary Rehab    Staff Present  Joellyn Rued, BS, PEC;Carroll Enterkin, RN, BSN;Jessica Hazel Dell, MA, RCEP, CCRP, Exercise Physiologist;Amitai Delaughter Sommer, BA, ACSM CEP, Exercise Physiologist    Medication changes reported      No    Fall or balance concerns reported     No    Tobacco Cessation  No Change    Warm-up and Cool-down  Performed on first and last piece of equipment    Resistance Training Performed  Yes    VAD Patient?  No    PAD/SET Patient?  No      Pain Assessment   Currently in Pain?  No/denies    Pain Score  0-No pain    Multiple Pain Sites  No          Social History   Tobacco Use  Smoking Status Former Smoker  . Packs/day: 1.00  . Years: 40.00  . Pack years: 40.00  . Types: Cigarettes  . Last attempt to quit: 08/2015  . Years since quitting: 2.5  Smokeless Tobacco Never Used    Goals Met:  Independence with exercise equipment Exercise tolerated well No report of cardiac concerns or symptoms Strength training completed today  Goals Unmet:  Not Applicable  Comments: Pt able to follow exercise prescription today without complaint.  Will continue to monitor for progression.    Dr. Emily Filbert is Medical Director for [place] and [name].

## 2018-03-08 ENCOUNTER — Encounter: Payer: Medicare Other | Attending: Family Medicine

## 2018-03-08 DIAGNOSIS — I5022 Chronic systolic (congestive) heart failure: Secondary | ICD-10-CM | POA: Insufficient documentation

## 2018-03-10 ENCOUNTER — Encounter: Payer: Medicare Other | Admitting: *Deleted

## 2018-03-10 DIAGNOSIS — I5022 Chronic systolic (congestive) heart failure: Secondary | ICD-10-CM

## 2018-03-10 NOTE — Progress Notes (Signed)
Daily Session Note  Patient Details  Name: Kevin Pearson MRN: [id number] Date of Birth: 1950-08-22 Referring Provider:     Cardiac Rehab from 12/02/2017 in [facility] Cardiac and Pulmonary Rehab  Referring Provider  Ida Rogue MD      Encounter Date: 03/10/2018  Check In: Session Check In - 03/10/18 0840      Check-In   Supervising physician immediately available to respond to emergencies  See telemetry face sheet for immediately available ER MD    Location  [facility]-Cardiac & Pulmonary Rehab    Staff Present  Carson Myrtle, BS, RRT, Respiratory Therapist;Carroll Enterkin, RN, BSN;Eara Burruel, MA, RCEP, CCRP, Exercise Physiologist;Amanda Oletta Darter, BA, ACSM CEP, Exercise Physiologist    Medication changes reported      No    Fall or balance concerns reported     No    Warm-up and Cool-down  Performed on first and last piece of equipment    Resistance Training Performed  Yes    VAD Patient?  No    PAD/SET Patient?  No      Pain Assessment   Currently in Pain?  No/denies          Social History   Tobacco Use  Smoking Status Former Smoker  . Packs/day: 1.00  . Years: 40.00  . Pack years: 40.00  . Types: Cigarettes  . Last attempt to quit: 08/2015  . Years since quitting: 2.5  Smokeless Tobacco Never Used    Goals Met:  Independence with exercise equipment Exercise tolerated well No report of cardiac concerns or symptoms Strength training completed today  Goals Unmet:  Not Applicable  Comments:Pt able to follow exercise prescription today without complaint.  Will continue to monitor for progression.     Dr. Emily Filbert is Medical Director for [place] and [name].

## 2018-03-15 VITALS — Ht 67.4 in | Wt 264.0 lb

## 2018-03-15 DIAGNOSIS — I5022 Chronic systolic (congestive) heart failure: Secondary | ICD-10-CM | POA: Diagnosis not present

## 2018-03-15 NOTE — Progress Notes (Signed)
Daily Session Note  Patient Details  Name: Kevin Pearson MRN: [id number] Date of Birth: [birth year] Referring Provider:     Cardiac Rehab from 12/02/2017 in [facility] Cardiac and Pulmonary Rehab  Referring Provider  Ida Rogue MD      Encounter Date: 03/15/2018  Check In: Session Check In - 03/15/18 0820      Check-In   Supervising physician immediately available to respond to emergencies  See telemetry face sheet for immediately available ER MD    Location  [facility]-Cardiac & Pulmonary Rehab    Staff Present  Nyoka Cowden, RN, BSN, Willette Pa, MA, RCEP, CCRP, Exercise Physiologist;Amanda Oletta Darter, [place], ACSM CEP, Exercise Physiologist    Medication changes reported      No    Fall or balance concerns reported     No    Warm-up and Cool-down  Performed on first and last piece of equipment    Resistance Training Performed  Yes    VAD Patient?  No    PAD/SET Patient?  No      Pain Assessment   Currently in Pain?  No/denies    Multiple Pain Sites  No          Social History   Tobacco Use  Smoking Status Former Smoker  . Packs/day: 1.00  . Years: 40.00  . Pack years: 40.00  . Types: Cigarettes  . Last attempt to quit: 08/2015  . Years since quitting: 2.6  Smokeless Tobacco Never Used    Goals Met:  Independence with exercise equipment Exercise tolerated well No report of cardiac concerns or symptoms Strength training completed today  Goals Unmet:  Not Applicable  Comments:  [place] Name 12/02/17 1052 03/15/18 0822       6 Minute Walk   Phase  Initial Using Pulmonary Admission Test from 09/20/17  Discharge    Distance  1002 feet  1125 feet    Distance % Change  23.8 %  12 %    Distance Feet Change  -  123 ft    Walk Time  6 minutes  6 minutes    # of Rest Breaks  0  0    MPH  1.9  2.13    METS  1.97  2.37    RPE  15  13    Perceived Dyspnea   2  3    VO2 Peak  6.88  8.3    Symptoms  No  No    Resting HR  70 bpm  77 bpm    Resting BP   128/56  148/74    Resting Oxygen Saturation   99 %  97 %    Exercise Oxygen Saturation  during 6 min walk  97 %  93 %    Max Ex. HR  104 bpm  103 bpm    Max Ex. BP  136/84  164/66    2 Minute Post BP  134/74  -      Interval HR   1 Minute HR  92  70    2 Minute HR  91  70    3 Minute HR  93  70    4 Minute HR  89  -    5 Minute HR  97  -    6 Minute HR  104  103    2 Minute Post HR  72  -    Interval Heart Rate?  Yes  Yes  Interval Oxygen   Interval Oxygen?  Yes  -    Baseline Oxygen Saturation %  99 %  0 %    1 Minute Oxygen Saturation %  97 %  95 %    1 Minute Liters of Oxygen  0 L Room Air  0 L    2 Minute Oxygen Saturation %  97 %  96 %    2 Minute Liters of Oxygen  0 L  0 L    3 Minute Oxygen Saturation %  97 %  96 %    3 Minute Liters of Oxygen  0 L  0 L    4 Minute Oxygen Saturation %  98 %  -    4 Minute Liters of Oxygen  0 L  0 L    5 Minute Oxygen Saturation %  97 %  -    5 Minute Liters of Oxygen  0 L  0 L    6 Minute Oxygen Saturation %  97 %  93 %    6 Minute Liters of Oxygen  0 L  0 L    2 Minute Post Oxygen Saturation %  99 %  -    2 Minute Post Liters of Oxygen  0 L  0 L         Dr. Emily Filbert is Medical Director for [place] and [name].

## 2018-03-16 ENCOUNTER — Encounter: Payer: Self-pay | Admitting: *Deleted

## 2018-03-16 DIAGNOSIS — I5022 Chronic systolic (congestive) heart failure: Secondary | ICD-10-CM

## 2018-03-16 NOTE — Progress Notes (Signed)
Cardiac Individual Treatment Plan  Patient Details  Name: Kevin Pearson MRN: [id number] Date of Birth: Mar 19, 1951 Referring Provider:     Cardiac Rehab from 12/02/2017 in [facility]  Referring Provider  Ida Rogue MD      Initial Encounter Date:    Cardiac Rehab from 12/02/2017 in [facility] Cardiac and Pulmonary Rehab  Date  12/02/17      Visit Diagnosis: Heart failure, chronic systolic ([place])  Patient's Home Medications on Admission:  Current Outpatient Medications:  .  acetaminophen (TYLENOL) 325 MG tablet, Take 650 mg by mouth every 6 (six) hours as needed. , Disp: , Rfl:  .  albuterol (PROVENTIL HFA;VENTOLIN HFA) 108 (90 Base) MCG/ACT inhaler, Inhale 2 puffs into the lungs every 6 (six) hours as needed for wheezing or shortness of breath., Disp: 1 Inhaler, Rfl: 2 .  budesonide (PULMICORT) 0.5 MG/2ML nebulizer solution, Inhale 2 mLs into the lungs daily as needed. , Disp: , Rfl: 1 .  busPIRone (BUSPAR) 10 MG tablet, Take 10 mg by mouth 3 (three) times daily., Disp: , Rfl:  .  clopidogrel (PLAVIX) 75 MG tablet, Take 75 mg by mouth daily., Disp: , Rfl:  .  divalproex (DEPAKOTE) 500 MG DR tablet, Take 500 mg by mouth 2 (two) times daily. , Disp: , Rfl:  .  Elastic Bandages & Supports (T.E.D. BELOW KNEE/X-LARGE) MISC, 1 application by Does not apply route daily., Disp: 6 each, Rfl: 0 .  gabapentin (NEURONTIN) 300 MG capsule, Take 600 mg by mouth 2 (two) times daily. , Disp: , Rfl:  .  ipratropium-albuterol (DUONEB) 0.5-2.5 (3) MG/3ML SOLN, Take 3 mLs by nebulization every 6 (six) hours as needed (wheezing/shortness of breath)., Disp: , Rfl:  .  isosorbide mononitrate (IMDUR) 30 MG 24 hr tablet, Take 30 mg by mouth daily., Disp: , Rfl:  .  losartan (COZAAR) 25 MG tablet, Take 1 tablet (25 mg total) by mouth daily., Disp: 30 tablet, Rfl: 11 .  Melatonin 5 MG TABS, Take 5 mg by mouth at bedtime., Disp: , Rfl:  .  metoprolol succinate (TOPROL XL) 25 MG 24 hr tablet,  Take 1 tablet (25 mg total) by mouth daily., Disp: 90 tablet, Rfl: 3 .  nitroGLYCERIN (NITROSTAT) 0.4 MG SL tablet, Place 0.4 mg under the tongue every 5 (five) minutes as needed for chest pain., Disp: , Rfl:  .  omeprazole (PRILOSEC) 40 MG capsule, Take 40 mg by mouth daily., Disp: , Rfl:  .  rivaroxaban (XARELTO) 20 MG TABS tablet, Take 20 mg by mouth at bedtime., Disp: , Rfl:  .  rosuvastatin (CRESTOR) 20 MG tablet, Take 20 mg by mouth daily., Disp: , Rfl:  .  senna (SENOKOT) 8.6 MG tablet, Take 2 tablets by mouth as needed. , Disp: , Rfl:  .  spironolactone (ALDACTONE) 25 MG tablet, Take 0.5 tablets (12.5 mg total) by mouth daily., Disp: 45 tablet, Rfl: 3 .  tamsulosin (FLOMAX) 0.4 MG CAPS capsule, TAKE ONE CAPSULE BY MOUTH EVERY DAY AFTER SUPPER, Disp: 30 capsule, Rfl: 11 .  torsemide (DEMADEX) 20 MG tablet, 2 tablets (40 mg) in the AM and 1 tablet (20 mg) at lunch., Disp: 270 tablet, Rfl: 3 .  traZODone (DESYREL) 50 MG tablet, Take 100 mg by mouth at bedtime., Disp: , Rfl:  .  vortioxetine HBr (TRINTELLIX) 5 MG TABS, Take 5 mg by mouth daily. , Disp: , Rfl:   Past Medical History: Past Medical History:  Diagnosis Date  . Acid reflux   .  Anxiety   . Arthritis   . Atrial fibrillation ([place])   . CHF (congestive heart failure) ([place])   . Chronic orthostatic hypotension   . Clotting disorder ([place])   . COPD (chronic obstructive pulmonary disease) ([place])   . Depression   . Elevated PSA   . Heart attack ([place])   . Heart disease   . Heart failure ([place])   . Hepatitis   . High cholesterol   . Hypertension   . Sleep apnea   . TBI (traumatic brain injury) ([place])   . Urinary retention     Tobacco Use: Social History   Tobacco Use  Smoking Status Former Smoker  . Packs/day: 1.00  . Years: 40.00  . Pack years: 40.00  . Types: Cigarettes  . Last attempt to quit: 08/2015  . Years since quitting: 2.6  Smokeless Tobacco Never Used    Labs: Recent Review Scientist, physiological    Labs for ITP  Cardiac and Pulmonary Rehab Latest Ref Rng & Units 11/23/2017   Cholestrol 100 - 199 mg/dL 114   LDLCALC 0 - 99 mg/dL 60   HDL >39 mg/dL 30(L)   Trlycerides 0 - 149 mg/dL 119       Exercise Target Goals: Exercise Program Goal: Individual exercise prescription set using results from initial 6 min walk test and THRR while considering  patient's activity barriers and safety.   Exercise Prescription Goal: Initial exercise prescription builds to 30-45 minutes a day of aerobic activity, 2-3 days per week.  Home exercise guidelines will be given to patient during program as part of exercise prescription that the participant will acknowledge.  Activity Barriers & Risk Stratification: Activity Barriers & Cardiac Risk Stratification - 12/02/17 1054      Activity Barriers & Cardiac Risk Stratification   Activity Barriers  Deconditioning;Muscular Weakness;Shortness of Breath;Back Problems;Joint Problems;Decreased Ventricular Function;Balance Concerns;History of Falls;Neck/Spine Problems    Cardiac Risk Stratification  High       6 Minute Walk: 6 Minute Walk    Row Name 12/02/17 1052 03/15/18 0822       6 Minute Walk   Phase  Initial Using Pulmonary Admission Test from 09/20/17  Discharge    Distance  1002 feet  1125 feet    Distance % Change  23.8 %  12 %    Distance Feet Change  -  123 ft    Walk Time  6 minutes  6 minutes    # of Rest Breaks  0  0    MPH  1.9  2.13    METS  1.97  2.37    RPE  15  13    Perceived Dyspnea   2  3    VO2 Peak  6.88  8.3    Symptoms  No  No    Resting HR  70 bpm  77 bpm    Resting BP  128/56  148/74    Resting Oxygen Saturation   99 %  97 %    Exercise Oxygen Saturation  during 6 min walk  97 %  93 %    Max Ex. HR  104 bpm  103 bpm    Max Ex. BP  136/84  164/66    2 Minute Post BP  134/74  -      Interval HR   1 Minute HR  92  70    2 Minute HR  91  70    3 Minute HR  93  70  4 Minute HR  89  -    5 Minute HR  97  -    6 Minute HR  104  103     2 Minute Post HR  72  -    Interval Heart Rate?  Yes  Yes      Interval Oxygen   Interval Oxygen?  Yes  -    Baseline Oxygen Saturation %  99 %  0 %    1 Minute Oxygen Saturation %  97 %  95 %    1 Minute Liters of Oxygen  0 L Room Air  0 L    2 Minute Oxygen Saturation %  97 %  96 %    2 Minute Liters of Oxygen  0 L  0 L    3 Minute Oxygen Saturation %  97 %  96 %    3 Minute Liters of Oxygen  0 L  0 L    4 Minute Oxygen Saturation %  98 %  -    4 Minute Liters of Oxygen  0 L  0 L    5 Minute Oxygen Saturation %  97 %  -    5 Minute Liters of Oxygen  0 L  0 L    6 Minute Oxygen Saturation %  97 %  93 %    6 Minute Liters of Oxygen  0 L  0 L    2 Minute Post Oxygen Saturation %  99 %  -    2 Minute Post Liters of Oxygen  0 L  0 L       Oxygen Initial Assessment:   Oxygen Re-Evaluation:   Oxygen Discharge (Final Oxygen Re-Evaluation):   Initial Exercise Prescription: Initial Exercise Prescription - 12/02/17 1000      Date of Initial Exercise RX and Referring Provider   Date  12/02/17    Referring Provider  Ida Rogue MD      Treadmill   MPH  2    Grade  0    Minutes  15    METs  2.53      NuStep   Level  3    SPM  80    Minutes  15    METs  2.3      Arm Ergometer   Level  1    RPM  50    Minutes  15    METs  2      Prescription Details   Frequency (times per week)  3    Duration  Progress to 45 minutes of aerobic exercise without signs/symptoms of physical distress      Intensity   THRR 40-80% of Max Heartrate  104-138    Ratings of Perceived Exertion  11-13    Perceived Dyspnea  0-4      Progression   Progression  Continue to progress workloads to maintain intensity without signs/symptoms of physical distress.      Resistance Training   Training Prescription  Yes    Weight  4 lbs    Reps  10-15       Perform Capillary Blood Glucose checks as needed.  Exercise Prescription Changes: Exercise Prescription Changes    Row Name  12/23/17 0900 12/29/17 1500 01/14/18 0800 01/26/18 0900 02/08/18 1500     Response to Exercise   Blood Pressure (Admit)  -  146/78  122/84  118/72  132/72   Blood Pressure (Exercise)  -  132/60  144/88  136/70  140/74   Blood Pressure (Exit)  -  112/74  118/78  128/72  114/68   Heart Rate (Admit)  -  73 bpm  72 bpm  87 bpm  61 bpm   Heart Rate (Exercise)  -  112 bpm  95 bpm  112 bpm  107 bpm   Heart Rate (Exit)  -  74 bpm  63 bpm  74 bpm  70 bpm   Perceived Dyspnea (Exercise)  -  _0 Symptoms  -  none  none  none  none   Duration  -  Continue with 30 min of aerobic exercise without signs/symptoms of physical distress.  Continue with 30 min of aerobic exercise without signs/symptoms of physical distress.  Continue with 30 min of aerobic exercise without signs/symptoms of physical distress.  Continue with 30 min of aerobic exercise without signs/symptoms of physical distress.   Intensity  -  THRR unchanged  THRR unchanged  THRR unchanged  THRR unchanged     Progression   Progression  -  Continue to progress workloads to maintain intensity without signs/symptoms of physical distress.  Continue to progress workloads to maintain intensity without signs/symptoms of physical distress.  Continue to progress workloads to maintain intensity without signs/symptoms of physical distress.  Continue to progress workloads to maintain intensity without signs/symptoms of physical distress.   Average METs  -  2.25  2.25  1.93  2.34     Resistance Training   Training Prescription  -  Yes  Yes  Yes  Yes   Weight  -  4 lbs  4 lb  4 lbs  4 lbs   Reps  -  10-15  10-15  10-15  -     Interval Training   Interval Training  -  No  No  No  -     Treadmill   MPH  -  -  -  1  -   Minutes  -  -  -  15  -   METs  -  -  -  1.77  -     NuStep   Level  -  _1 -   Minutes  -  _2 -   METs  -  3  -  2.1  -     Arm Ergometer   Level  -  1  -  -  -   Minutes  -  15  -  -  -   METs  -  1.9  -   -  -     Home Exercise Plan   Plans to continue exercise at  [employer] [facility] [employer] (comment) water aerobics at [employer] (comment) water aerobics at [employer] (comment) water aerobics at [employer] (comment) water aerobics at [place] 2 additional days to program exercise sessions.  Add 2 additional days to program exercise sessions.  Add 2 additional days to program exercise sessions.  Add 2 additional days to program exercise sessions.  -   Initial Home Exercises Provided  09/29/17  09/29/17  09/29/17  09/29/17  -   [place] Name 02/22/18 1500 03/08/18 1500           Response to Exercise   Blood Pressure (Admit)  134/78  122/68  Blood Pressure (Exercise)  128/74  144/84      Blood Pressure (Exit)  120/72  124/70      Heart Rate (Admit)  77 bpm  65 bpm      Heart Rate (Exercise)  99 bpm  105 bpm      Heart Rate (Exit)  74 bpm  76 bpm      Perceived Dyspnea (Exercise)  13  12      Symptoms  none  none      Duration  Continue with 30 min of aerobic exercise without signs/symptoms of physical distress.  Continue with 30 min of aerobic exercise without signs/symptoms of physical distress.      Intensity  THRR unchanged  THRR unchanged        Progression   Progression  Continue to progress workloads to maintain intensity without signs/symptoms of physical distress.  Continue to progress workloads to maintain intensity without signs/symptoms of physical distress.      Average METs  2.6  2.37        Resistance Training   Training Prescription  Yes  Yes      Weight  4 lbs  5 lbs      Reps  10-15  10-15        Interval Training   Interval Training  No  No        Treadmill   MPH  -  1      Grade  -  0      Minutes  -  15      METs  -  1.77        NuStep   Level  6  6      Minutes  15  15      METs  3  3.2        Arm Ergometer   Level  1  2      Minutes  15  15      METs  2.2  2.1         Home Exercise Plan   Plans to continue exercise at  [employer] (comment) water aerobics at [employer] (comment) water aerobics at [facility]  Add 2 additional days to program exercise sessions.  Add 2 additional days to program exercise sessions.      Initial Home Exercises Provided  09/29/17  09/29/17         Exercise Comments: Exercise Comments    Row Name 12/02/17 [phone]           Exercise Comments  First full day of exercise!  Patient was oriented to gym and equipment including functions, settings, policies, and procedures.  Patient's individual exercise prescription and treatment plan were reviewed.  All starting workloads were established based on the results of the 6 minute walk test done at initial orientation visit.  The plan for exercise progression was also introduced and progression will be customized based on patient's performance and goals.          Exercise Goals and Review: Exercise Goals    Row Name 12/02/17 1059             Exercise Goals   Increase Physical Activity  Yes       Intervention  Provide advice, education, support and counseling about physical activity/exercise needs.;Develop an individualized exercise prescription for aerobic and resistive training based on initial evaluation findings, risk  stratification, comorbidities and participant's personal goals.       Expected Outcomes  Short Term: Attend rehab on a regular basis to increase amount of physical activity.;Long Term: Add in home exercise to make exercise part of routine and to increase amount of physical activity.;Long Term: Exercising regularly at least 3-5 days a week.       Increase Strength and Stamina  Yes       Intervention  Provide advice, education, support and counseling about physical activity/exercise needs.;Develop an individualized exercise prescription for aerobic and resistive training based on initial evaluation findings, risk  stratification, comorbidities and participant's personal goals.       Expected Outcomes  Short Term: Increase workloads from initial exercise prescription for resistance, speed, and METs.;Short Term: Perform resistance training exercises routinely during rehab and add in resistance training at home;Long Term: Improve cardiorespiratory fitness, muscular endurance and strength as measured by increased METs and functional capacity (6MWT)       Able to understand and use rate of perceived exertion (RPE) scale  Yes       Intervention  Provide education and explanation on how to use RPE scale       Expected Outcomes  Short Term: Able to use RPE daily in rehab to express subjective intensity level;Long Term:  Able to use RPE to guide intensity level when exercising independently       Able to understand and use Dyspnea scale  Yes       Intervention  Provide education and explanation on how to use Dyspnea scale       Expected Outcomes  Short Term: Able to use Dyspnea scale daily in rehab to express subjective sense of shortness of breath during exertion;Long Term: Able to use Dyspnea scale to guide intensity level when exercising independently       Knowledge and understanding of Target Heart Rate Range (THRR)  Yes       Intervention  Provide education and explanation of THRR including how the numbers were predicted and where they are located for reference       Expected Outcomes  Short Term: Able to state/look up THRR;Long Term: Able to use THRR to govern intensity when exercising independently;Short Term: Able to use daily as guideline for intensity in rehab       Able to check pulse independently  Yes       Intervention  Provide education and demonstration on how to check pulse in carotid and radial arteries.;Review the importance of being able to check your own pulse for safety during independent exercise       Expected Outcomes  Short Term: Able to explain why pulse checking is important during independent  exercise;Long Term: Able to check pulse independently and accurately       Understanding of Exercise Prescription  Yes       Intervention  Provide education, explanation, and written materials on patient's individual exercise prescription       Expected Outcomes  Short Term: Able to explain program exercise prescription;Long Term: Able to explain home exercise prescription to exercise independently          Exercise Goals Re-Evaluation : Exercise Goals Re-Evaluation    Row Name 12/02/17 0834 12/14/17 1428 12/23/17 0940 12/29/17 1523 01/14/18 0810     Exercise Goal Re-Evaluation   Exercise Goals Review  Increase Physical Activity;Increase Strength and Stamina;Able to understand and use rate of perceived exertion (RPE) scale  -  Increase Physical Activity;Able to understand and  use rate of perceived exertion (RPE) scale;Knowledge and understanding of Target Heart Rate Range (THRR);Understanding of Exercise Prescription  Increase Physical Activity;Increase Strength and Stamina;Understanding of Exercise Prescription  Increase Physical Activity;Increase Strength and Stamina;Able to understand and use rate of perceived exertion (RPE) scale   Comments  Reviewed RPE scale, THR and program prescription with pt today.  Pt voiced understanding and was given a copy of goals to take home.   Out since last review  Reviewed home exercise again to update for cardiac sessions.   Janzen has been doing well in rehab.  He is still on level 1 for the arm crank, but he forgets to try to make progression.   He has been avoiding the treadmill and we will make sure that he is able to get back on there.  We will continue to work on his progress and reminding him to advance his workloads.    Pt tolerates exercise well. Staff will encourage walking.     Expected Outcomes  Short: Use RPE daily to regulate intensity.  Long: Follow program prescription in THR.  -  Short: Add back in home exercise.  Long: Continue to increase  phsyical activity  Short: Increase arm crank workloads.  Long: Continue to exercise at home.   Short - add TM back to sessions Long - increase MET level   Row Name 01/26/18 7893 02/01/18 0808 02/08/18 1511 02/22/18 1512 03/01/18 0827     Exercise Goal Re-Evaluation   Exercise Goals Review  Increase Physical Activity;Increase Strength and Stamina;Able to understand and use rate of perceived exertion (RPE) scale  Increase Physical Activity;Increase Strength and Stamina;Understanding of Exercise Prescription  Increase Physical Activity;Increase Strength and Stamina;Understanding of Exercise Prescription  Increase Physical Activity;Increase Strength and Stamina;Understanding of Exercise Prescription  Increase Physical Activity;Increase Strength and Stamina;Understanding of Exercise Prescription   Comments  Shyne returned yesterday after being out for over a week.  He was able to increase his workload on NuStep to level 5.  We will continue to monitor his progress.   Terrian has been moving.  He has not been doing any walking other than moving. He has been doing wall push ups.  As things start to settle back into place, he is going to start getting back to his walking.  He belongs to [employer] and plans to get back in there.   Aroldo has been out since last week.  He is still trying to move.   We will continue to encourage him to get back to [employer].  He is now on level 6 on the NuStep.  We will continue to monitor his progress.   Azan has only attended once since last review.  He is hoping to return on Thursday.  We will continue to monitor his progression.   Corderius has not been able to walk very well since he hurt his foot moving, but it is starting to get better.  He has not been exercising at home.  He has quit attending [employer] and insurance is going to pay for him to come to [employer].     Expected Outcomes  Short: Continue to work on increasing more workloads.  Long: Continue to  increase strength and stamina.   Short: Get back to home exercise routine.  Long: Continue to work on increasing physcial activity.   Short: Continue to increase workloads and attend regularly  Long: Continue to increase strength and stamina  Short: Continue to work on increasing workloads.  Long: Continue to exercise independently.   Short: Complete Financial controller paperwork.  Long: Continue to exercise independently.    [place] Name 03/08/18 1527             Exercise Goal Re-Evaluation   Exercise Goals Review  Increase Physical Activity;Increase Strength and Stamina;Understanding of Exercise Prescription       Comments  Inri has been doing well in rehab.  He is now up to 3.2 METs on the NuStep.  We will continue to monitor his progress.        Expected Outcomes  Short: Continue to attend class regularly to graduate. Long: Continue to exercise at gym.            Discharge Exercise Prescription (Final Exercise Prescription Changes): Exercise Prescription Changes - 03/08/18 1500      Response to Exercise   Blood Pressure (Admit)  122/68    Blood Pressure (Exercise)  144/84    Blood Pressure (Exit)  124/70    Heart Rate (Admit)  65 bpm    Heart Rate (Exercise)  105 bpm    Heart Rate (Exit)  76 bpm    Perceived Dyspnea (Exercise)  12    Symptoms  none    Duration  Continue with 30 min of aerobic exercise without signs/symptoms of physical distress.    Intensity  THRR unchanged      Progression   Progression  Continue to progress workloads to maintain intensity without signs/symptoms of physical distress.    Average METs  2.37      Resistance Training   Training Prescription  Yes    Weight  5 lbs    Reps  10-15      Interval Training   Interval Training  No      Treadmill   MPH  1    Grade  0    Minutes  15    METs  1.77      NuStep   Level  6    Minutes  15    METs  3.2      Arm Ergometer   Level  2    Minutes  15    METs  2.1      Home Exercise Plan   Plans to  continue exercise at  [employer] (comment)   water aerobics at [place] 2 additional days to program exercise sessions.    Initial Home Exercises Provided  09/29/17       Nutrition:  Target Goals: Understanding of nutrition guidelines, daily intake of sodium <1554m, cholesterol <2090m calories 30% from fat and 7% or less from saturated fats, daily to have 5 or more servings of fruits and vegetables.  Biometrics: Pre Biometrics - 12/02/17 1101      Pre Biometrics   Height  5' 7.4" (1.712 m)    Weight  256 lb 12.8 oz (116.5 kg)    Waist Circumference  45 inches    Hip Circumference  46 inches    Waist to Hip Ratio  0.98 %    BMI (Calculated)  39.74      Post Biometrics - 03/15/18 0821       Post  Biometrics   Height  5' 7.4" (1.712 m)    Weight  264 lb (119.7 kg)    Waist Circumference  45 inches    Hip Circumference  46 inches    Waist to Hip Ratio  0.98 %  BMI (Calculated)  40.86       Nutrition Therapy Plan and Nutrition Goals:   Nutrition Assessments: Nutrition Assessments - 03/15/18 0827      MEDFICTS Scores   Pre Score  67    Post Score  84    Score Difference  17       Nutrition Goals Re-Evaluation: Nutrition Goals Re-Evaluation    Row Name 01/13/18 0811 02/01/18 0821 02/03/18 0920 03/01/18 0832       Goals   Nutrition Goal  Look for ways to include additional servings of vegetables in your daily diet. Steaming, sauteeing, grilling, and roasting are all great cooking methods for them.  Continue to work on portion control and don't skip meals.   Continue to work on eating on a regular schedule, trying not to skip meals  Continue to work on eating on a regular schedule, trying not to skip meals, work on weight loss    Comment  He has been trying to include more vegetables in his diet, he is hit or miss wth this. He sees a RD every month  He has had a lot going on.  He has started to skip some meals.  He continues to see his  nutritionist every month.    He continues to have an irregular eating schedule d/t current health, family and personal responsibilities and concerns  Gean still wants to join [employer].  He still likes carbs and eating larger portions.  He also has not been taking in enough vegetables.  He is able to identify his problems, but has not made an effort to make changes.  He is cooking some at home.     Expected Outcome  He will include servings of vegetables daily when planning meals work on portion control.  Short: Continue to work on portion control.  Long: Don't skip meals and eat more at home.   He will eat regularly throughout the day, keeping sodium intake and portion control in mind  Short: Continue to work on weight loss and focus in on portion control.  Long: Continue to work on heart healthy eating.       Personal Goal #2 Re-Evaluation   Personal Goal #2  Pay closer attention to portion sizes at meal times, particularly starchy foods. Attempting to work on this. Cooks at home eats out once a week.  -  Weight reduction; he is considering joining [employer] once he moves, and joining [employer] [employer] and times for nearby locations provided  -       Nutrition Goals Discharge (Final Nutrition Goals Re-Evaluation): Nutrition Goals Re-Evaluation - 03/01/18 5852      Goals   Nutrition Goal  Continue to work on eating on a regular schedule, trying not to skip meals, work on weight loss    Comment  Rishik still wants to join [employer].  He still likes carbs and eating larger portions.  He also has not been taking in enough vegetables.  He is able to identify his problems, but has not made an effort to make changes.  He is cooking some at home.     Expected Outcome  Short: Continue to work on weight loss and focus in on portion control.  Long: Continue to work on heart healthy eating.        Psychosocial: Target Goals: Acknowledge presence or absence of significant depression and/or  stress, maximize coping skills, provide positive support system. Participant is able to verbalize types and ability to  use techniques and skills needed for reducing stress and depression.   Initial Review & Psychosocial Screening:   Quality of Life Scores:  Quality of Life - 03/15/18 0824      Quality of Life   Select  Quality of Life      Quality of Life Scores   Health/Function Pre  7.8 %    Health/Function Post  17.27 %    Health/Function % Change  121.41 %    Socioeconomic Pre  16.83 %    Socioeconomic Post  23.58 %    Socioeconomic % Change   40.11 %    Psych/Spiritual Pre  19.64 %    Psych/Spiritual Post  19.64 %    Psych/Spiritual % Change  0 %    Family Pre  15.75 %    Family Post  20 %    Family % Change  26.98 %    GLOBAL Pre  13.08 %    GLOBAL Post  19.31 %    GLOBAL % Change  47.63 %      Scores of 19 and below usually indicate a poorer quality of life in these areas.  A difference of  2-3 points is a clinically meaningful difference.  A difference of 2-3 points in the total score of the Quality of Life Index has been associated with significant improvement in overall quality of life, self-image, physical symptoms, and general health in studies assessing change in quality of life.  PHQ-9: Recent Review Flowsheet Data    Depression screen [facility] 2/9 03/15/2018 12/27/2017 12/02/2017 10/06/2017 09/23/2017   Decreased Interest 1 0 2 2 -   Down, Depressed, Hopeless 1 0 _0 PHQ - 2 Score 2 0 _1 Altered sleeping 1 - 1 2 -   Tired, decreased energy 1 - 1 2 -   Change in appetite 1 - 1 2 -   Feeling bad or failure about yourself  1 - 1 2 -   Trouble concentrating 1 - 1 1 -   Moving slowly or fidgety/restless 3 - 1 1 -   Suicidal thoughts 0 - 0 1 0   PHQ-9 Score 10 - 10 14 -   Difficult doing work/chores Somewhat difficult - Somewhat difficult - -     Interpretation of Total Score  Total Score Depression Severity:  1-4 = Minimal depression, 5-9 = Mild depression,  10-14 = Moderate depression, 15-19 = Moderately severe depression, 20-27 = Severe depression   Psychosocial Evaluation and Intervention: Psychosocial Evaluation - 12/28/17 0939      Psychosocial Evaluation & Interventions   Comments  Mr. Kanoa Phillippi) returned to Cardiac Rehab due to increased pulmonary and cardiac difficulties.  Counselor met with him today for updated psychosocial evaluation.  He is 67 years old and has a limited support system currently - reporting his daughter wants to "put him in a nursing home."  He is sleeping better now with 6-7 hours with the use of medication and his appetite is good.  Shaul reports a history of depression and anxiety and states his current medications for this are working well as his mood is generally positive and less irritable.  Stress in his life are his health and family conflict.  He has goals to increase his ability to breathe and be able to do more activities.  He will be followed by staff.      Expected Outcomes  Short:  Urban will improve his breathing with  exercise.   Long:  Tynell will cope more positively with skills learned and increase his support system for more positive interactions.      Continue Psychosocial Services   Follow up required by staff       Psychosocial Re-Evaluation: Psychosocial Re-Evaluation    [place] Name 01/13/18 0814 02/01/18 0817 03/01/18 0830         Psychosocial Re-Evaluation   Current issues with  Current Sleep Concerns  Current Stress Concerns  Current Stress Concerns     Comments  -  Baden has been out with a death in his family and dealing with the fall out from that.  He has also been working on moving as well. He will need to follow up with counselor.  He has also had his dog sick.   He is also toying with the idea about moving his mother hear.   Taison continues to have a lot going on in his life. He is now nursing a sore foot from moving.  He is working on his plans for after graduation to continue to  exercise.  He still needs to follow up with Juliann Pulse again.  He is making a good effort to get in here each day.      Expected Outcomes  -  Short: Meet with Juliann Pulse.  Settle into new home.  Long: Continue to work on Radiographer, therapeutic.  Short: Meet with Juliann Pulse.  Long: Continue to work on Doctor, general practice.      Interventions  -  Stress management education;Encouraged to attend Cardiac Rehabilitation for the exercise  Stress management education;Encouraged to attend Cardiac Rehabilitation for the exercise     Continue Psychosocial Services   -  Follow up required by counselor  Follow up required by counselor     Comments  -  He uses medication to help him sleep and sees a psychologist.  -       Initial Review   Source of Stress Concerns  -  Chronic Illness;Unable to participate in former interests or hobbies;Family;Financial  Chronic Illness;Unable to participate in former interests or hobbies;Family;Financial        Psychosocial Discharge (Final Psychosocial Re-Evaluation): Psychosocial Re-Evaluation - 03/01/18 0830      Psychosocial Re-Evaluation   Current issues with  Current Stress Concerns    Comments  Gaurav continues to have a lot going on in his life. He is now nursing a sore foot from moving.  He is working on his plans for after graduation to continue to exercise.  He still needs to follow up with Juliann Pulse again.  He is making a good effort to get in here each day.     Expected Outcomes  Short: Meet with Juliann Pulse.  Long: Continue to work on Doctor, general practice.     Interventions  Stress management education;Encouraged to attend Cardiac Rehabilitation for the exercise    Continue Psychosocial Services   Follow up required by counselor      Initial Review   Source of Stress Concerns  Chronic Illness;Unable to participate in former interests or hobbies;Family;Financial       Vocational Rehabilitation: Provide vocational rehab assistance to qualifying candidates.   Vocational Rehab  Evaluation & Intervention:   Education: Education Goals: Education classes will be provided on a variety of topics geared toward better understanding of heart health and risk factor modification. Participant will state understanding/return demonstration of topics presented as noted by education test scores.  Learning Barriers/Preferences: Learning Barriers/Preferences - 12/02/17 1109  Learning Barriers/Preferences   Learning Barriers  Hearing;Sight    Learning Preferences  Skilled Demonstration;Written Material;Computer/Internet;Group Instruction;Verbal Instruction       Education Topics:  AED/CPR: - Group verbal and written instruction with the use of models to demonstrate the basic use of the AED with the basic ABC's of resuscitation.   General Nutrition Guidelines/Fats and Fiber: -Group instruction provided by verbal, written material, models and posters to present the general guidelines for heart healthy nutrition. Gives an explanation and review of dietary fats and fiber.   Cardiac Rehab from 03/15/2018 in [facility] Cardiac and Pulmonary Rehab  Date  03/15/18  Educator  LB  Instruction Review Code  1- Verbalizes Understanding      Controlling Sodium/Reading Food Labels: -Group verbal and written material supporting the discussion of sodium use in heart healthy nutrition. Review and explanation with models, verbal and written materials for utilization of the food label.   Cardiac Rehab from 03/15/2018 in [facility] Cardiac and Pulmonary Rehab  Date  01/25/18  Educator  SB  Instruction Review Code  1- Verbalizes Understanding      Exercise Physiology & General Exercise Guidelines: - Group verbal and written instruction with models to review the exercise physiology of the cardiovascular system and associated critical values. Provides general exercise guidelines with specific guidelines to those with heart or lung disease.    Cardiac Rehab from 03/15/2018 in [facility] Cardiac and Pulmonary  Rehab  Date  02/01/18  Educator  [facility]  Instruction Review Code  1- Verbalizes Understanding      Aerobic Exercise & Resistance Training: - Gives group verbal and written instruction on the various components of exercise. Focuses on aerobic and resistive training programs and the benefits of this training and how to safely progress through these programs..   Cardiac Rehab from 03/15/2018 in [facility] Cardiac and Pulmonary Rehab  Date  02/10/18  Educator  AS  Instruction Review Code  1- Verbalizes Understanding      Flexibility, Balance, Mind/Body Relaxation: Provides group verbal/written instruction on the benefits of flexibility and balance training, including mind/body exercise modes such as yoga, pilates and tai chi.  Demonstration and skill practice provided.   Cardiac Rehab from 03/15/2018 in [facility] Cardiac and Pulmonary Rehab  Date  12/23/17  Educator  AS  Instruction Review Code  1- Verbalizes Understanding      Stress and Anxiety: - Provides group verbal and written instruction about the health risks of elevated stress and causes of high stress.  Discuss the correlation between heart/lung disease and anxiety and treatment options. Review healthy ways to manage with stress and anxiety.   Cardiac Rehab from 10/27/2016 in [facility] Cardiac and Pulmonary Rehab  Date  08/13/16  Educator  TS  Instruction Review Code (retired)  2- meets goals/outcomes      Depression: - Provides group verbal and written instruction on the correlation between heart/lung disease and depressed mood, treatment options, and the stigmas associated with seeking treatment.   Cardiac Rehab from 03/15/2018 in [facility] Cardiac and Pulmonary Rehab  Date  11/17/17  Educator  [facility]  Instruction Review Code  1- Verbalizes Understanding      Anatomy & Physiology of the Heart: - Group verbal and written instruction and models provide basic cardiac anatomy and physiology, with the coronary electrical and arterial systems. Review of  Valvular disease and Heart Failure   Cardiac Rehab from 03/15/2018 in [facility] Cardiac and Pulmonary Rehab  Date  02/24/18  Educator  CE  Instruction Review Code  1- Verbalizes Understanding      Cardiac Procedures: - Group verbal and written instruction to review commonly prescribed medications for heart disease. Reviews the medication, class of the drug, and side effects. Includes the steps to properly store meds and maintain the prescription regimen. (beta blockers and nitrates)   Cardiac Rehab from 10/27/2016 in [facility] Cardiac and Pulmonary Rehab  Date  10/13/16  Educator  SB  Instruction Review Code (retired)  2- meets goals/outcomes      Cardiac Medications I: - Group verbal and written instruction to review commonly prescribed medications for heart disease. Reviews the medication, class of the drug, and side effects. Includes the steps to properly store meds and maintain the prescription regimen.   Cardiac Rehab from 03/15/2018 in [facility] Cardiac and Pulmonary Rehab  Date  03/01/18  Educator  SB  Instruction Review Code  1- Verbalizes Understanding      Cardiac Medications II: -Group verbal and written instruction to review commonly prescribed medications for heart disease. Reviews the medication, class of the drug, and side effects. (all other drug classes)   Cardiac Rehab from 03/15/2018 in [facility] Cardiac and Pulmonary Rehab  Date  12/16/17  Educator  CE  Instruction Review Code  1- Verbalizes Understanding       Go Sex-Intimacy & Heart Disease, Get SMART - Goal Setting: - Group verbal and written instruction through game format to discuss heart disease and the return to sexual intimacy. Provides group verbal and written material to discuss and apply goal setting through the application of the S.M.A.R.T. Method.   Cardiac Rehab from 10/27/2016 in [facility] Cardiac and Pulmonary Rehab  Date  10/13/16  Educator  SB  Instruction Review Code (retired)  2- meets goals/outcomes      Other  Matters of the Heart: - Provides group verbal, written materials and models to describe Stable Angina and Peripheral Artery. Includes description of the disease process and treatment options available to the cardiac patient.   Cardiac Rehab from 03/15/2018 in [facility] Cardiac and Pulmonary Rehab  Date  02/24/18  Educator  CE  Instruction Review Code  1- Verbalizes Understanding      Exercise & Equipment Safety: - Individual verbal instruction and demonstration of equipment use and safety with use of the equipment.   Cardiac Rehab from 03/15/2018 in [facility] Cardiac and Pulmonary Rehab  Date  09/20/17  Educator  [facility]  Instruction Review Code  1- Verbalizes Understanding      Infection Prevention: - Provides verbal and written material to individual with discussion of infection control including proper hand washing and proper equipment cleaning during exercise session.   Cardiac Rehab from 03/15/2018 in [facility] Cardiac and Pulmonary Rehab  Date  09/20/17  Educator  [facility]  Instruction Review Code  1- Verbalizes Understanding      Falls Prevention: - Provides verbal and written material to individual with discussion of falls prevention and safety.   Cardiac Rehab from 03/15/2018 in [facility] Cardiac and Pulmonary Rehab  Date  09/20/17  Educator  [facility]  Instruction Review Code  1- Verbalizes Understanding      Diabetes: - Individual verbal and written instruction to review signs/symptoms of diabetes, desired ranges of glucose level fasting, after meals and with exercise. Acknowledge that pre and post exercise glucose checks will be done for 3 sessions at entry of program.   Know Your Numbers and Risk Factors: -Group verbal and written instruction about important numbers in your health.  Discussion of what are risk factors and  how they play a role in the disease process.  Review of Cholesterol, Blood Pressure, Diabetes, and BMI and the role they play in your overall health.   Cardiac Rehab from 03/15/2018 in  [facility] Cardiac and Pulmonary Rehab  Date  12/16/17  Educator  CE  Instruction Review Code  1- Verbalizes Understanding      Sleep Hygiene: -Provides group verbal and written instruction about how sleep can affect your health.  Define sleep hygiene, discuss sleep cycles and impact of sleep habits. Review good sleep hygiene tips.    Cardiac Rehab from 03/15/2018 in [facility] Cardiac and Pulmonary Rehab  Date  02/03/18  Educator  [facility]  Instruction Review Code  1- Verbalizes Understanding      Other: -Provides group and verbal instruction on various topics (see comments)   Knowledge Questionnaire Score: Knowledge Questionnaire Score - 03/15/18 1355      Knowledge Questionnaire Score   Pre Score  24/26    Post Score  24/26   test reviewed with pt today      Core Components/Risk Factors/Patient Goals at Admission: Personal Goals and Risk Factors at Admission - 12/02/17 1102      Core Components/Risk Factors/Patient Goals on Admission    Weight Management  Yes;Obesity;Weight Loss    Intervention  Weight Management: Develop a combined nutrition and exercise program designed to reach desired caloric intake, while maintaining appropriate intake of nutrient and fiber, sodium and fats, and appropriate energy expenditure required for the weight goal.;Weight Management: Provide education and appropriate resources to help participant work on and attain dietary goals.;Weight Management/Obesity: Establish reasonable short term and long term weight goals.;Obesity: Provide education and appropriate resources to help participant work on and attain dietary goals.    Admit Weight  256 lb 12.8 oz (116.5 kg)    Goal Weight: Short Term  252 lb (114.3 kg)    Goal Weight: Long Term  180 lb (81.6 kg)    Expected Outcomes  Short Term: Continue to assess and modify interventions until short term weight is achieved;Long Term: Adherence to nutrition and physical activity/exercise program aimed toward attainment of  established weight goal;Weight Loss: Understanding of general recommendations for a balanced deficit meal plan, which promotes 1-2 lb weight loss per week and includes a negative energy balance of [phone] kcal/d;Understanding recommendations for meals to include 15-35% energy as protein, 25-35% energy from fat, 35-60% energy from carbohydrates, less than 234m of dietary cholesterol, 20-35 gm of total fiber daily;Understanding of distribution of calorie intake throughout the day with the consumption of 4-5 meals/snacks    Improve shortness of breath with ADL's  Yes    Intervention  Provide education, individualized exercise plan and daily activity instruction to help decrease symptoms of SOB with activities of daily living.    Expected Outcomes  Short Term: Improve cardiorespiratory fitness to achieve a reduction of symptoms when performing ADLs;Long Term: Be able to perform more ADLs without symptoms or delay the onset of symptoms    Heart Failure  Yes    Intervention  Provide a combined exercise and nutrition program that is supplemented with education, support and counseling about heart failure. Directed toward relieving symptoms such as shortness of breath, decreased exercise tolerance, and extremity edema.    Expected Outcomes  Improve functional capacity of life;Short term: Attendance in program 2-3 days a week with increased exercise capacity. Reported lower sodium intake. Reported increased fruit and vegetable intake. Reports medication compliance.;Short term: Daily weights obtained and reported for increase. Utilizing  diuretic protocols set by physician.;Long term: Adoption of self-care skills and reduction of barriers for early signs and symptoms recognition and intervention leading to self-care maintenance.    Hypertension  Yes    Intervention  Provide education on lifestyle modifcations including regular physical activity/exercise, weight management, moderate sodium restriction and increased  consumption of fresh fruit, vegetables, and low fat dairy, alcohol moderation, and smoking cessation.;Monitor prescription use compliance.    Expected Outcomes  Short Term: Continued assessment and intervention until BP is < 140/16m HG in hypertensive participants. < 130/847mHG in hypertensive participants with diabetes, heart failure or chronic kidney disease.;Long Term: Maintenance of blood pressure at goal levels.    Lipids  Yes    Intervention  Provide education and support for participant on nutrition & aerobic/resistive exercise along with prescribed medications to achieve LDL <7044mHDL >10m73m  Expected Outcomes  Short Term: Participant states understanding of desired cholesterol values and is compliant with medications prescribed. Participant is following exercise prescription and nutrition guidelines.;Long Term: Cholesterol controlled with medications as prescribed, with individualized exercise RX and with personalized nutrition plan. Value goals: LDL < 70mg69mL > 40 mg.    Stress  Yes    Intervention  Offer individual and/or small group education and counseling on adjustment to heart disease, stress management and health-related lifestyle change. Teach and support self-help strategies.;Refer participants experiencing significant psychosocial distress to appropriate mental health specialists for further evaluation and treatment. When possible, include family members and significant others in education/counseling sessions.    Expected Outcomes  Short Term: Participant demonstrates changes in health-related behavior, relaxation and other stress management skills, ability to obtain effective social support, and compliance with psychotropic medications if prescribed.;Long Term: Emotional wellbeing is indicated by absence of clinically significant psychosocial distress or social isolation.       Core Components/Risk Factors/Patient Goals Review:  Goals and Risk Factor Review    Row Name  02/01/18 0812 07377/[id number]           Core Components/Risk Factors/Patient Goals Review   Personal Goals Review  Weight Management/Obesity;Lipids;Hypertension;Heart Failure  Weight Management/Obesity;Lipids;Hypertension;Heart Failure      Review  Mylik's weight is up today to 262 lbs.  He has a lot of fluid on board.  He is working with his doctors to find a new balance.  He is having some swelling with this as well.  His kidney function is off.   His blood pressures have been good.  He does not check it at home as he doesn't like the cuff.  He is planning to get a new one.   Sparsh's weight continues to go up and down. He is still fluid overloaded.  He continues to work on finding a balance.  He continues to have good pressures.  He did get a new cuff and is checking it twice a week at home.  He is now weighing daily again.       Expected Outcomes  Short: Get new blood pressure cuff.  Long: Continue to work on heart failure and getting fluid off.   Short: Continue to weigh daily.  Long: Continue to monitor heart failure.          Core Components/Risk Factors/Patient Goals at Discharge (Final Review):  Goals and Risk Factor Review - 03/01/18 0836      Core Components/Risk Factors/Patient Goals Review   Personal Goals Review  Weight Management/Obesity;Lipids;Hypertension;Heart Failure    Review  Ebert's weight continues to go up and down.  He is still fluid overloaded.  He continues to work on finding a balance.  He continues to have good pressures.  He did get a new cuff and is checking it twice a week at home.  He is now weighing daily again.     Expected Outcomes  Short: Continue to weigh daily.  Long: Continue to monitor heart failure.        ITP Comments: ITP Comments    Row Name 12-08-2017 1049 12/14/17 1428 12/22/17 0604 01/19/18 0545 02/01/18 0807   ITP Comments  Amirr started in Cardiac Rehab today.  Documentation for diagnosis can be found in [facility] enounter from 5/21 and admission  from 5/15.  New Cardaic ITP created and sent to Dr. Emily Filbert, Medical Director for review and signature.   Called to check on pt as he has been out since 12-09-2022.  He had a death in the family and needed to go to [place]. He also took time to visit with his family. 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  He has also been sick.  He is hoping to try to come in on Thursday.   30 day review. Continue with ITP unless directed changes per Medical Director review  30 day review. Continue with ITP unless directed changes per Medical Director review.   One session completed in July.  [place]  Herschell was out with a death in the family. He moved from [facility] to [place]. Address updated   [place] Name 02/16/18 0809 02/22/18 1512 03/16/18 0619       ITP Comments  30 day review completed. ITP sent to Dr. Ramonita Lab, covering for Dr. Emily Filbert, Medical Director of Cardiac Rehab. Continue with ITP unless changes are made by physician  Called to check on pt.  He has been out with lots of things going on at home between moving and dealing with family.  He is hoping to return on Thursday.   30 day review completed. ITP sent to Dr. Emily Filbert, Medical Director of Cardiac Rehab. Continue with ITP unless changes are made by physician        Comments:

## 2018-03-17 ENCOUNTER — Encounter: Payer: Self-pay | Admitting: Dietician

## 2018-03-17 ENCOUNTER — Encounter: Payer: Medicare Other | Admitting: *Deleted

## 2018-03-17 DIAGNOSIS — I5022 Chronic systolic (congestive) heart failure: Secondary | ICD-10-CM

## 2018-03-17 NOTE — Progress Notes (Signed)
Daily Session Note  Patient Details  Name: Kevin Pearson MRN: [id number] Date of Birth: [birth year] Referring Provider:     Cardiac Rehab from 12/02/2017 in [facility] Cardiac and Pulmonary Rehab  Referring Provider  Ida Rogue MD      Encounter Date: 03/17/2018  Check In: Session Check In - 03/17/18 0814      Check-In   Supervising physician immediately available to respond to emergencies  See telemetry face sheet for immediately available ER MD    Location  [facility]-Cardiac & Pulmonary Rehab    Staff Present  Alberteen Sam, MA, RCEP, CCRP, Exercise Physiologist;Jenell Dobransky Frederico Hamman, RN BSN;Carroll Enterkin, RN, BSN    Medication changes reported      No    Fall or balance concerns reported     No    Tobacco Cessation  No Change    Warm-up and Cool-down  Performed on first and last piece of equipment    Resistance Training Performed  Yes    VAD Patient?  No    PAD/SET Patient?  No      Pain Assessment   Currently in Pain?  No/denies    Multiple Pain Sites  No          Social History   Tobacco Use  Smoking Status Former Smoker  . Packs/day: 1.00  . Years: 40.00  . Pack years: 40.00  . Types: Cigarettes  . Last attempt to quit: 08/2015  . Years since quitting: 2.6  Smokeless Tobacco Never Used    Goals Met:  Independence with exercise equipment Exercise tolerated well No report of cardiac concerns or symptoms Strength training completed today  Goals Unmet:  Not Applicable  Comments: Pt able to follow exercise prescription today without complaint.  Will continue to monitor for progression.    Dr. Emily Filbert is Medical Director for [place] and [name].

## 2018-03-22 DIAGNOSIS — J449 Chronic obstructive pulmonary disease, unspecified: Secondary | ICD-10-CM

## 2018-03-22 DIAGNOSIS — I5022 Chronic systolic (congestive) heart failure: Secondary | ICD-10-CM

## 2018-03-22 NOTE — Patient Instructions (Signed)
Discharge Patient Instructions  Patient Details  Name: Kevin Pearson MRN: [id number] Date of Birth: January 09, 1951 Referring Provider:  Marygrace Drought, MD   Number of Visits: 23  Reason for Discharge:  Patient reached a stable level of exercise. Patient has met program and personal goals.  Smoking History:  Social History   Tobacco Use  Smoking Status Former Smoker  . Packs/day: 1.00  . Years: 40.00  . Pack years: 40.00  . Types: Cigarettes  . Last attempt to quit: 08/2015  . Years since quitting: 2.6  Smokeless Tobacco Never Used    Diagnosis:  Heart failure, chronic systolic (HCC)  COPD, mild (HCC)  Initial Exercise Prescription: Initial Exercise Prescription - 12/02/17 1000      Date of Initial Exercise RX and Referring Provider   Date  12/02/17    Referring Provider  Ida Rogue MD      Treadmill   MPH  2    Grade  0    Minutes  15    METs  2.53      NuStep   Level  3    SPM  80    Minutes  15    METs  2.3      Arm Ergometer   Level  1    RPM  50    Minutes  15    METs  2      Prescription Details   Frequency (times per week)  3    Duration  Progress to 45 minutes of aerobic exercise without signs/symptoms of physical distress      Intensity   THRR 40-80% of Max Heartrate  104-138    Ratings of Perceived Exertion  11-13    Perceived Dyspnea  0-4      Progression   Progression  Continue to progress workloads to maintain intensity without signs/symptoms of physical distress.      Resistance Training   Training Prescription  Yes    Weight  4 lbs    Reps  10-15       Discharge Exercise Prescription (Final Exercise Prescription Changes): Exercise Prescription Changes - 03/21/18 1600      Response to Exercise   Blood Pressure (Admit)  134/72    Blood Pressure (Exercise)  128/64    Blood Pressure (Exit)  136/64    Heart Rate (Admit)  60 bpm    Heart Rate (Exercise)  84 bpm    Heart Rate (Exit)  55 bpm    Perceived Dyspnea (Exercise)   12    Symptoms  none    Duration  Continue with 30 min of aerobic exercise without signs/symptoms of physical distress.    Intensity  THRR unchanged      Progression   Progression  Continue to progress workloads to maintain intensity without signs/symptoms of physical distress.    Average METs  2.03      Resistance Training   Training Prescription  Yes    Weight  5 lbs    Reps  10-15      Interval Training   Interval Training  No      Treadmill   MPH  1.7    Grade  0    Minutes  15    METs  2.3      NuStep   Level  7    Minutes  15    METs  2      Arm Ergometer   Level  2    Minutes  15    METs  2      Home Exercise Plan   Plans to continue exercise at  [employer] (comment)   water aerobics at [employer]  Add 2 additional days to program exercise sessions.    Initial Home Exercises Provided  09/29/17       Functional Capacity: 6 Minute Walk    Row Name 12/02/17 1052 03/15/18 0822       6 Minute Walk   Phase  Initial Using Pulmonary Admission Test from 09/20/17  Discharge    Distance  1002 feet  1125 feet    Distance % Change  23.8 %  12 %    Distance Feet Change  -  123 ft    Walk Time  6 minutes  6 minutes    # of Rest Breaks  0  0    MPH  1.9  2.13    METS  1.97  2.37    RPE  15  13    Perceived Dyspnea   2  3    VO2 Peak  6.88  8.3    Symptoms  No  No    Resting HR  70 bpm  77 bpm    Resting BP  128/56  148/74    Resting Oxygen Saturation   99 %  97 %    Exercise Oxygen Saturation  during 6 min walk  97 %  93 %    Max Ex. HR  104 bpm  103 bpm    Max Ex. BP  136/84  164/66    2 Minute Post BP  134/74  -      Interval HR   1 Minute HR  92  70    2 Minute HR  91  70    3 Minute HR  93  70    4 Minute HR  89  -    5 Minute HR  97  -    6 Minute HR  104  103    2 Minute Post HR  72  -    Interval Heart Rate?  Yes  Yes      Interval Oxygen   Interval Oxygen?  Yes  -    Baseline Oxygen Saturation %  99 %  0 %    1  Minute Oxygen Saturation %  97 %  95 %    1 Minute Liters of Oxygen  0 L Room Air  0 L    2 Minute Oxygen Saturation %  97 %  96 %    2 Minute Liters of Oxygen  0 L  0 L    3 Minute Oxygen Saturation %  97 %  96 %    3 Minute Liters of Oxygen  0 L  0 L    4 Minute Oxygen Saturation %  98 %  -    4 Minute Liters of Oxygen  0 L  0 L    5 Minute Oxygen Saturation %  97 %  -    5 Minute Liters of Oxygen  0 L  0 L    6 Minute Oxygen Saturation %  97 %  93 %    6 Minute Liters of Oxygen  0 L  0 L    2 Minute Post Oxygen Saturation %  99 %  -    2 Minute Post Liters of Oxygen  0 L  0 L  Quality of Life: Quality of Life - 03/15/18 0824      Quality of Life   Select  Quality of Life      Quality of Life Scores   Health/Function Pre  7.8 %    Health/Function Post  17.27 %    Health/Function % Change  121.41 %    Socioeconomic Pre  16.83 %    Socioeconomic Post  23.58 %    Socioeconomic % Change   40.11 %    Psych/Spiritual Pre  19.64 %    Psych/Spiritual Post  19.64 %    Psych/Spiritual % Change  0 %    Family Pre  15.75 %    Family Post  20 %    Family % Change  26.98 %    GLOBAL Pre  13.08 %    GLOBAL Post  19.31 %    GLOBAL % Change  47.63 %       Personal Goals: Goals established at orientation with interventions provided to work toward goal. Personal Goals and Risk Factors at Admission - 12/02/17 1102      Core Components/Risk Factors/Patient Goals on Admission    Weight Management  Yes;Obesity;Weight Loss    Intervention  Weight Management: Develop a combined nutrition and exercise program designed to reach desired caloric intake, while maintaining appropriate intake of nutrient and fiber, sodium and fats, and appropriate energy expenditure required for the weight goal.;Weight Management: Provide education and appropriate resources to help participant work on and attain dietary goals.;Weight Management/Obesity: Establish reasonable short term and long term weight  goals.;Obesity: Provide education and appropriate resources to help participant work on and attain dietary goals.    Admit Weight  256 lb 12.8 oz (116.5 kg)    Goal Weight: Short Term  252 lb (114.3 kg)    Goal Weight: Long Term  180 lb (81.6 kg)    Expected Outcomes  Short Term: Continue to assess and modify interventions until short term weight is achieved;Long Term: Adherence to nutrition and physical activity/exercise program aimed toward attainment of established weight goal;Weight Loss: Understanding of general recommendations for a balanced deficit meal plan, which promotes 1-2 lb weight loss per week and includes a negative energy balance of [phone] kcal/d;Understanding recommendations for meals to include 15-35% energy as protein, 25-35% energy from fat, 35-60% energy from carbohydrates, less than '200mg'$  of dietary cholesterol, 20-35 gm of total fiber daily;Understanding of distribution of calorie intake throughout the day with the consumption of 4-5 meals/snacks    Improve shortness of breath with ADL's  Yes    Intervention  Provide education, individualized exercise plan and daily activity instruction to help decrease symptoms of SOB with activities of daily living.    Expected Outcomes  Short Term: Improve cardiorespiratory fitness to achieve a reduction of symptoms when performing ADLs;Long Term: Be able to perform more ADLs without symptoms or delay the onset of symptoms    Heart Failure  Yes    Intervention  Provide a combined exercise and nutrition program that is supplemented with education, support and counseling about heart failure. Directed toward relieving symptoms such as shortness of breath, decreased exercise tolerance, and extremity edema.    Expected Outcomes  Improve functional capacity of life;Short term: Attendance in program 2-3 days a week with increased exercise capacity. Reported lower sodium intake. Reported increased fruit and vegetable intake. Reports medication  compliance.;Short term: Daily weights obtained and reported for increase. Utilizing diuretic protocols set by physician.;Long term: Adoption of self-care skills  and reduction of barriers for early signs and symptoms recognition and intervention leading to self-care maintenance.    Hypertension  Yes    Intervention  Provide education on lifestyle modifcations including regular physical activity/exercise, weight management, moderate sodium restriction and increased consumption of fresh fruit, vegetables, and low fat dairy, alcohol moderation, and smoking cessation.;Monitor prescription use compliance.    Expected Outcomes  Short Term: Continued assessment and intervention until BP is < 140/51m HG in hypertensive participants. < 130/880mHG in hypertensive participants with diabetes, heart failure or chronic kidney disease.;Long Term: Maintenance of blood pressure at goal levels.    Lipids  Yes    Intervention  Provide education and support for participant on nutrition & aerobic/resistive exercise along with prescribed medications to achieve LDL <7033mHDL >40m24m  Expected Outcomes  Short Term: Participant states understanding of desired cholesterol values and is compliant with medications prescribed. Participant is following exercise prescription and nutrition guidelines.;Long Term: Cholesterol controlled with medications as prescribed, with individualized exercise RX and with personalized nutrition plan. Value goals: LDL < 70mg79mL > 40 mg.    Stress  Yes    Intervention  Offer individual and/or small group education and counseling on adjustment to heart disease, stress management and health-related lifestyle change. Teach and support self-help strategies.;Refer participants experiencing significant psychosocial distress to appropriate mental health specialists for further evaluation and treatment. When possible, include family members and significant others in education/counseling sessions.    Expected  Outcomes  Short Term: Participant demonstrates changes in health-related behavior, relaxation and other stress management skills, ability to obtain effective social support, and compliance with psychotropic medications if prescribed.;Long Term: Emotional wellbeing is indicated by absence of clinically significant psychosocial distress or social isolation.        Personal Goals Discharge: Goals and Risk Factor Review - 03/01/18 0836      Core Components/Risk Factors/Patient Goals Review   Personal Goals Review  Weight Management/Obesity;Lipids;Hypertension;Heart Failure    Review  Kevin Pearson's weight continues to go up and down. He is still fluid overloaded.  He continues to work on finding a balance.  He continues to have good pressures.  He did get a new cuff and is checking it twice a week at home.  He is now weighing daily again.     Expected Outcomes  Short: Continue to weigh daily.  Long: Continue to monitor heart failure.        Exercise Goals and Review: Exercise Goals    Row Name 12/02/17 1059             Exercise Goals   Increase Physical Activity  Yes       Intervention  Provide advice, education, support and counseling about physical activity/exercise needs.;Develop an individualized exercise prescription for aerobic and resistive training based on initial evaluation findings, risk stratification, comorbidities and participant's personal goals.       Expected Outcomes  Short Term: Attend rehab on a regular basis to increase amount of physical activity.;Long Term: Add in home exercise to make exercise part of routine and to increase amount of physical activity.;Long Term: Exercising regularly at least 3-5 days a week.       Increase Strength and Stamina  Yes       Intervention  Provide advice, education, support and counseling about physical activity/exercise needs.;Develop an individualized exercise prescription for aerobic and resistive training based on initial evaluation  findings, risk stratification, comorbidities and participant's personal goals.  Expected Outcomes  Short Term: Increase workloads from initial exercise prescription for resistance, speed, and METs.;Short Term: Perform resistance training exercises routinely during rehab and add in resistance training at home;Long Term: Improve cardiorespiratory fitness, muscular endurance and strength as measured by increased METs and functional capacity (6MWT)       Able to understand and use rate of perceived exertion (RPE) scale  Yes       Intervention  Provide education and explanation on how to use RPE scale       Expected Outcomes  Short Term: Able to use RPE daily in rehab to express subjective intensity level;Long Term:  Able to use RPE to guide intensity level when exercising independently       Able to understand and use Dyspnea scale  Yes       Intervention  Provide education and explanation on how to use Dyspnea scale       Expected Outcomes  Short Term: Able to use Dyspnea scale daily in rehab to express subjective sense of shortness of breath during exertion;Long Term: Able to use Dyspnea scale to guide intensity level when exercising independently       Knowledge and understanding of Target Heart Rate Range (THRR)  Yes       Intervention  Provide education and explanation of THRR including how the numbers were predicted and where they are located for reference       Expected Outcomes  Short Term: Able to state/look up THRR;Long Term: Able to use THRR to govern intensity when exercising independently;Short Term: Able to use daily as guideline for intensity in rehab       Able to check pulse independently  Yes       Intervention  Provide education and demonstration on how to check pulse in carotid and radial arteries.;Review the importance of being able to check your own pulse for safety during independent exercise       Expected Outcomes  Short Term: Able to explain why pulse checking is important  during independent exercise;Long Term: Able to check pulse independently and accurately       Understanding of Exercise Prescription  Yes       Intervention  Provide education, explanation, and written materials on patient's individual exercise prescription       Expected Outcomes  Short Term: Able to explain program exercise prescription;Long Term: Able to explain home exercise prescription to exercise independently          Nutrition & Weight - Outcomes: Pre Biometrics - 12/02/17 1101      Pre Biometrics   Height  5' 7.4" (1.712 m)    Weight  256 lb 12.8 oz (116.5 kg)    Waist Circumference  45 inches    Hip Circumference  46 inches    Waist to Hip Ratio  0.98 %    BMI (Calculated)  39.74      Post Biometrics - 03/15/18 0821       Post  Biometrics   Height  5' 7.4" (1.712 m)    Weight  264 lb (119.7 kg)    Waist Circumference  45 inches    Hip Circumference  46 inches    Waist to Hip Ratio  0.98 %    BMI (Calculated)  40.86       Nutrition:   Nutrition Discharge: Nutrition Assessments - 03/15/18 0827      MEDFICTS Scores   Pre Score  67    Post Score  84  Score Difference  17       Education Questionnaire Score: Knowledge Questionnaire Score - 03/15/18 1355      Knowledge Questionnaire Score   Pre Score  24/26    Post Score  24/26   test reviewed with pt today      Goals reviewed with patient; copy given to patient.

## 2018-03-22 NOTE — Progress Notes (Signed)
Daily Session Note  Patient Details  Name: Kevin Pearson MRN: [id number] Date of Birth: 11/19/1950 Referring Provider:     Cardiac Rehab from 12/02/2017 in [facility] Cardiac and Pulmonary Rehab  Referring Provider  Ida Rogue MD      Encounter Date: 03/22/2018  Check In: Session Check In - 03/22/18 0837      Check-In   Supervising physician immediately available to respond to emergencies  See telemetry face sheet for immediately available ER MD    Location  [facility]-Cardiac & Pulmonary Rehab    Staff Present  Nada Maclachlan, BA, ACSM CEP, Exercise Physiologist;Krista Frederico Hamman, RN BSN;Jessica Luan Pulling, MA, RCEP, CCRP, Exercise Physiologist    Medication changes reported      No    Fall or balance concerns reported     No    Warm-up and Cool-down  Performed on first and last piece of equipment    Resistance Training Performed  Yes    VAD Patient?  No    PAD/SET Patient?  No      Pain Assessment   Currently in Pain?  No/denies    Multiple Pain Sites  No          Social History   Tobacco Use  Smoking Status Former Smoker  . Packs/day: 1.00  . Years: 40.00  . Pack years: 40.00  . Types: Cigarettes  . Last attempt to quit: 08/2015  . Years since quitting: 2.6  Smokeless Tobacco Never Used    Goals Met:  Independence with exercise equipment Exercise tolerated well No report of cardiac concerns or symptoms Strength training completed today  Goals Unmet:  Not Applicable  Comments: Pt able to follow exercise prescription today without complaint.  Will continue to monitor for progression.    Dr. Emily Filbert is Medical Director for [place] and [name].

## 2018-03-24 ENCOUNTER — Encounter: Payer: Medicare Other | Admitting: *Deleted

## 2018-03-24 DIAGNOSIS — I5022 Chronic systolic (congestive) heart failure: Secondary | ICD-10-CM | POA: Diagnosis not present

## 2018-03-24 NOTE — Progress Notes (Signed)
Discharge Progress Report  Patient Details  Name: Kevin Pearson MRN: [id number] Date of Birth: 07/02/1951 Referring Provider:     Cardiac [facility]  Referring Provider  Julien NordmannGollan, Timothy MD       Number of Visits: [phone]  Reason for Discharge:  Patient reached a stable level of exercise. Patient independent in their exercise.  Smoking History:  Social History   Tobacco Use  Smoking Status Former Smoker  . Packs/day: 1.00  . Years: 40.00  . Pack years: 40.00  . Types: Cigarettes  . Last attempt to quit: 08/2015  . Years since quitting: 2.6  Smokeless Tobacco Never Used    Diagnosis:  Heart failure, chronic systolic (HCC)  ADL UCSD:   Initial Exercise Prescription: Initial Exercise Prescription - 12/02/17 1000      Date of Initial Exercise RX and Referring Provider   Date  12/02/17    Referring Provider  Julien NordmannGollan, Timothy MD      Treadmill   MPH  2    Grade  0    Minutes  15    METs  2.53      NuStep   Level  3    SPM  80    Minutes  15    METs  2.3      Arm Ergometer   Level  1    RPM  50    Minutes  15    METs  2      Prescription Details   Frequency (times per week)  3    Duration  Progress to 45 minutes of aerobic exercise without signs/symptoms of physical distress      Intensity   THRR 40-80% of Max Heartrate  104-138    Ratings of Perceived Exertion  11-13    Perceived Dyspnea  0-4      Progression   Progression  Continue to progress workloads to maintain intensity without signs/symptoms of physical distress.      Resistance Training   Training Prescription  Yes    Weight  4 lbs    Reps  10-15       Discharge Exercise Prescription (Final Exercise Prescription Changes): Exercise Prescription Changes - 03/21/18 1600      Response to Exercise   Blood Pressure (Admit)  134/72    Blood Pressure (Exercise)  128/64    Blood Pressure (Exit)  136/64    Heart Rate (Admit)  60 bpm    Heart Rate  (Exercise)  84 bpm    Heart Rate (Exit)  55 bpm    Perceived Dyspnea (Exercise)  12    Symptoms  none    Duration  Continue with 30 min of aerobic exercise without signs/symptoms of physical distress.    Intensity  THRR unchanged      Progression   Progression  Continue to progress workloads to maintain intensity without signs/symptoms of physical distress.    Average METs  2.03      Resistance Training   Training Prescription  Yes    Weight  5 lbs    Reps  10-15      Interval Training   Interval Training  No      Treadmill   MPH  1.7    Grade  0    Minutes  15    METs  2.3      NuStep   Level  7    Minutes  15    METs  2      Arm Ergometer   Level  2    Minutes  15    METs  2      Home Exercise Plan   Plans to continue exercise at  [employer] (comment)   water aerobics at [employer]  Add 2 additional days to program exercise sessions.    Initial Home Exercises Provided  09/29/17       Functional Capacity: 6 Minute Walk    Row Name 12/02/17 1052 03/15/18 0822       6 Minute Walk   Phase  Initial Using Pulmonary Admission Test from 09/20/17  Discharge    Distance  1002 feet  1125 feet    Distance % Change  23.8 %  12 %    Distance Feet Change  -  123 ft    Walk Time  6 minutes  6 minutes    # of Rest Breaks  0  0    MPH  1.9  2.13    METS  1.97  2.37    RPE  15  13    Perceived Dyspnea   2  3    VO2 Peak  6.88  8.3    Symptoms  No  No    Resting HR  70 bpm  77 bpm    Resting BP  128/56  148/74    Resting Oxygen Saturation   99 %  97 %    Exercise Oxygen Saturation  during 6 min walk  97 %  93 %    Max Ex. HR  104 bpm  103 bpm    Max Ex. BP  136/84  164/66    2 Minute Post BP  134/74  -      Interval HR   1 Minute HR  92  70    2 Minute HR  91  70    3 Minute HR  93  70    4 Minute HR  89  -    5 Minute HR  97  -    6 Minute HR  104  103    2 Minute Post HR  72  -    Interval Heart Rate?  Yes  Yes      Interval Oxygen    Interval Oxygen?  Yes  -    Baseline Oxygen Saturation %  99 %  0 %    1 Minute Oxygen Saturation %  97 %  95 %    1 Minute Liters of Oxygen  0 L Room Air  0 L    2 Minute Oxygen Saturation %  97 %  96 %    2 Minute Liters of Oxygen  0 L  0 L    3 Minute Oxygen Saturation %  97 %  96 %    3 Minute Liters of Oxygen  0 L  0 L    4 Minute Oxygen Saturation %  98 %  -    4 Minute Liters of Oxygen  0 L  0 L    5 Minute Oxygen Saturation %  97 %  -    5 Minute Liters of Oxygen  0 L  0 L    6 Minute Oxygen Saturation %  97 %  93 %    6 Minute Liters of Oxygen  0 L  0 L    2 Minute Post Oxygen Saturation %  99 %  -  2 Minute Post Liters of Oxygen  0 L  0 L       Psychological, QOL, Others - Outcomes: PHQ 2/9: Depression screen [facility] 2/9 03/15/2018 12/27/2017 12/02/2017 10/06/2017 09/23/2017  Decreased Interest 1 0 2 2 -  Down, Depressed, Hopeless 1 0 2 1 1   PHQ - 2 Score 2 0 4 3 1   Altered sleeping 1 - 1 2 -  Tired, decreased energy 1 - 1 2 -  Change in appetite 1 - 1 2 -  Feeling bad or failure about yourself  1 - 1 2 -  Trouble concentrating 1 - 1 1 -  Moving slowly or fidgety/restless 3 - 1 1 -  Suicidal thoughts 0 - 0 1 0  PHQ-9 Score 10 - 10 14 -  Difficult doing work/chores Somewhat difficult - Somewhat difficult - -  Some recent data might be hidden    Quality of Life: Quality of Life - 03/15/18 0824      Quality of Life   Select  Quality of Life      Quality of Life Scores   Health/Function Pre  7.8 %    Health/Function Post  17.27 %    Health/Function % Change  121.41 %    Socioeconomic Pre  16.83 %    Socioeconomic Post  23.58 %    Socioeconomic % Change   40.11 %    Psych/Spiritual Pre  19.64 %    Psych/Spiritual Post  19.64 %    Psych/Spiritual % Change  0 %    Family Pre  15.75 %    Family Post  20 %    Family % Change  26.98 %    GLOBAL Pre  13.08 %    GLOBAL Post  19.31 %    GLOBAL % Change  47.63 %       Personal Goals: Goals established at  orientation with interventions provided to work toward goal. Personal Goals and Risk Factors at Admission - 12/02/17 1102      Core Components/Risk Factors/Patient Goals on Admission    Weight Management  Yes;Obesity;Weight Loss    Intervention  Weight Management: Develop a combined nutrition and exercise program designed to reach desired caloric intake, while maintaining appropriate intake of nutrient and fiber, sodium and fats, and appropriate energy expenditure required for the weight goal.;Weight Management: Provide education and appropriate resources to help participant work on and attain dietary goals.;Weight Management/Obesity: Establish reasonable short term and long term weight goals.;Obesity: Provide education and appropriate resources to help participant work on and attain dietary goals.    Admit Weight  256 lb 12.8 oz (116.5 kg)    Goal Weight: Short Term  252 lb (114.3 kg)    Goal Weight: Long Term  180 lb (81.6 kg)    Expected Outcomes  Short Term: Continue to assess and modify interventions until short term weight is achieved;Long Term: Adherence to nutrition and physical activity/exercise program aimed toward attainment of established weight goal;Weight Loss: Understanding of general recommendations for a balanced deficit meal plan, which promotes 1-2 lb weight loss per week and includes a negative energy balance of [phone] kcal/d;Understanding recommendations for meals to include 15-35% energy as protein, 25-35% energy from fat, 35-60% energy from carbohydrates, less than 200mg  of dietary cholesterol, 20-35 gm of total fiber daily;Understanding of distribution of calorie intake throughout the day with the consumption of 4-5 meals/snacks    Improve shortness of breath with ADL's  Yes    Intervention  Provide  education, individualized exercise plan and daily activity instruction to help decrease symptoms of SOB with activities of daily living.    Expected Outcomes  Short Term: Improve  cardiorespiratory fitness to achieve a reduction of symptoms when performing ADLs;Long Term: Be able to perform more ADLs without symptoms or delay the onset of symptoms    Heart Failure  Yes    Intervention  Provide a combined exercise and nutrition program that is supplemented with education, support and counseling about heart failure. Directed toward relieving symptoms such as shortness of breath, decreased exercise tolerance, and extremity edema.    Expected Outcomes  Improve functional capacity of life;Short term: Attendance in program 2-3 days a week with increased exercise capacity. Reported lower sodium intake. Reported increased fruit and vegetable intake. Reports medication compliance.;Short term: Daily weights obtained and reported for increase. Utilizing diuretic protocols set by physician.;Long term: Adoption of self-care skills and reduction of barriers for early signs and symptoms recognition and intervention leading to self-care maintenance.    Hypertension  Yes    Intervention  Provide education on lifestyle modifcations including regular physical activity/exercise, weight management, moderate sodium restriction and increased consumption of fresh fruit, vegetables, and low fat dairy, alcohol moderation, and smoking cessation.;Monitor prescription use compliance.    Expected Outcomes  Short Term: Continued assessment and intervention until BP is < 140/48mm HG in hypertensive participants. < 130/26mm HG in hypertensive participants with diabetes, heart failure or chronic kidney disease.;Long Term: Maintenance of blood pressure at goal levels.    Lipids  Yes    Intervention  Provide education and support for participant on nutrition & aerobic/resistive exercise along with prescribed medications to achieve LDL 70mg , HDL >40mg .    Expected Outcomes  Short Term: Participant states understanding of desired cholesterol values and is compliant with medications prescribed. Participant is following  exercise prescription and nutrition guidelines.;Long Term: Cholesterol controlled with medications as prescribed, with individualized exercise RX and with personalized nutrition plan. Value goals: LDL < 70mg , HDL > 40 mg.    Stress  Yes    Intervention  Offer individual and/or small group education and counseling on adjustment to heart disease, stress management and health-related lifestyle change. Teach and support self-help strategies.;Refer participants experiencing significant psychosocial distress to appropriate mental health specialists for further evaluation and treatment. When possible, include family members and significant others in education/counseling sessions.    Expected Outcomes  Short Term: Participant demonstrates changes in health-related behavior, relaxation and other stress management skills, ability to obtain effective social support, and compliance with psychotropic medications if prescribed.;Long Term: Emotional wellbeing is indicated by absence of clinically significant psychosocial distress or social isolation.        Personal Goals Discharge: Goals and Risk Factor Review    Row Name 02/01/18 6045 03/01/18 0836           Core Components/Risk Factors/Patient Goals Review   Personal Goals Review  Weight Management/Obesity;Lipids;Hypertension;Heart Failure  Weight Management/Obesity;Lipids;Hypertension;Heart Failure      Review  Liliana's weight is up today to 262 lbs.  He has a lot of fluid on board.  He is working with his doctors to find a new balance.  He is having some swelling with this as well.  His kidney function is off.   His blood pressures have been good.  He does not check it at home as he doesn't like the cuff.  He is planning to get a new one.   Kylie's weight continues to go up and down. He  is still fluid overloaded.  He continues to work on finding a balance.  He continues to have good pressures.  He did get a new cuff and is checking it twice a week at home.   He is now weighing daily again.       Expected Outcomes  Short: Get new blood pressure cuff.  Long: Continue to work on heart failure and getting fluid off.   Short: Continue to weigh daily.  Long: Continue to monitor heart failure.          Exercise Goals and Review: Exercise Goals    Row Name 12/02/17 1059             Exercise Goals   Increase Physical Activity  Yes       Intervention  Provide advice, education, support and counseling about physical activity/exercise needs.;Develop an individualized exercise prescription for aerobic and resistive training based on initial evaluation findings, risk stratification, comorbidities and participant's personal goals.       Expected Outcomes  Short Term: Attend rehab on a regular basis to increase amount of physical activity.;Long Term: Add in home exercise to make exercise part of routine and to increase amount of physical activity.;Long Term: Exercising regularly at least 3-5 days a week.       Increase Strength and Stamina  Yes       Intervention  Provide advice, education, support and counseling about physical activity/exercise needs.;Develop an individualized exercise prescription for aerobic and resistive training based on initial evaluation findings, risk stratification, comorbidities and participant's personal goals.       Expected Outcomes  Short Term: Increase workloads from initial exercise prescription for resistance, speed, and METs.;Short Term: Perform resistance training exercises routinely during rehab and add in resistance training at home;Long Term: Improve cardiorespiratory fitness, muscular endurance and strength as measured by increased METs and functional capacity ( )       Able to understand and use rate of perceived exertion (RPE) scale  Yes       Intervention  Provide education and explanation on how to use RPE scale       Expected Outcomes  Short Term: Able to use RPE daily in rehab to express subjective intensity  level;Long Term:  Able to use RPE to guide intensity level when exercising independently       Able to understand and use Dyspnea scale  Yes       Intervention  Provide education and explanation on how to use Dyspnea scale       Expected Outcomes  Short Term: Able to use Dyspnea scale daily in rehab to express subjective sense of shortness of breath during exertion;Long Term: Able to use Dyspnea scale to guide intensity level when exercising independently       Knowledge and understanding of Target Heart Rate Range (THRR)  Yes       Intervention  Provide education and explanation of THRR including how the numbers were predicted and where they are located for reference       Expected Outcomes  Short Term: Able to state/look up THRR;Long Term: Able to use THRR to govern intensity when exercising independently;Short Term: Able to use daily as guideline for intensity in rehab       Able to check pulse independently  Yes       Intervention  Provide education and demonstration on how to check pulse in carotid and radial arteries.;Review the importance of being able to check your own pulse for safety  during independent exercise       Expected Outcomes  Short Term: Able to explain why pulse checking is important during independent exercise;Long Term: Able to check pulse independently and accurately       Understanding of Exercise Prescription  Yes       Intervention  Provide education, explanation, and written materials on patient's individual exercise prescription       Expected Outcomes  Short Term: Able to explain program exercise prescription;Long Term: Able to explain home exercise prescription to exercise independently          Nutrition & Weight - Outcomes: Pre Biometrics - 12/02/17 1101      Pre Biometrics   Height  5' 7.4" (1.712 m)    Weight  256 lb 12.8 oz (116.5 kg)    Waist Circumference  45 inches    Hip Circumference  46 inches    Waist to Hip Ratio  0.98 %    BMI (Calculated)  39.74       Post Biometrics - 03/15/18 0821       Post  Biometrics   Height  5' 7.4" (1.712 m)    Weight  264 lb (119.7 kg)    Waist Circumference  45 inches    Hip Circumference  46 inches    Waist to Hip Ratio  0.98 %    BMI (Calculated)  40.86       Nutrition:   Nutrition Discharge: Nutrition Assessments - 03/15/18 0827      MEDFICTS Scores   Pre Score  67    Post Score  84    Score Difference  17       Education Questionnaire Score: Knowledge Questionnaire Score - 03/15/18 1355      Knowledge Questionnaire Score   Pre Score  24/26    Post Score  24/26   test reviewed with pt today      Goals reviewed with patient; copy given to patient.

## 2018-03-24 NOTE — Progress Notes (Signed)
Cardiac Individual Treatment Plan  Patient Details  Name: Kevin Pearson MRN: [id number] Date of Birth: Mar 19, 1951 Referring Provider:     Cardiac Rehab from 12/02/2017 in [facility]  Referring Provider  Ida Rogue MD      Initial Encounter Date:    Cardiac Rehab from 12/02/2017 in [facility] Cardiac and Pulmonary Rehab  Date  12/02/17      Visit Diagnosis: Heart failure, chronic systolic ([place])  Patient's Home Medications on Admission:  Current Outpatient Medications:  .  acetaminophen (TYLENOL) 325 MG tablet, Take 650 mg by mouth every 6 (six) hours as needed. , Disp: , Rfl:  .  albuterol (PROVENTIL HFA;VENTOLIN HFA) 108 (90 Base) MCG/ACT inhaler, Inhale 2 puffs into the lungs every 6 (six) hours as needed for wheezing or shortness of breath., Disp: 1 Inhaler, Rfl: 2 .  budesonide (PULMICORT) 0.5 MG/2ML nebulizer solution, Inhale 2 mLs into the lungs daily as needed. , Disp: , Rfl: 1 .  busPIRone (BUSPAR) 10 MG tablet, Take 10 mg by mouth 3 (three) times daily., Disp: , Rfl:  .  clopidogrel (PLAVIX) 75 MG tablet, Take 75 mg by mouth daily., Disp: , Rfl:  .  divalproex (DEPAKOTE) 500 MG DR tablet, Take 500 mg by mouth 2 (two) times daily. , Disp: , Rfl:  .  Elastic Bandages & Supports (T.E.D. BELOW KNEE/X-LARGE) MISC, 1 application by Does not apply route daily., Disp: 6 each, Rfl: 0 .  gabapentin (NEURONTIN) 300 MG capsule, Take 600 mg by mouth 2 (two) times daily. , Disp: , Rfl:  .  ipratropium-albuterol (DUONEB) 0.5-2.5 (3) MG/3ML SOLN, Take 3 mLs by nebulization every 6 (six) hours as needed (wheezing/shortness of breath)., Disp: , Rfl:  .  isosorbide mononitrate (IMDUR) 30 MG 24 hr tablet, Take 30 mg by mouth daily., Disp: , Rfl:  .  losartan (COZAAR) 25 MG tablet, Take 1 tablet (25 mg total) by mouth daily., Disp: 30 tablet, Rfl: 11 .  Melatonin 5 MG TABS, Take 5 mg by mouth at bedtime., Disp: , Rfl:  .  metoprolol succinate (TOPROL XL) 25 MG 24 hr tablet,  Take 1 tablet (25 mg total) by mouth daily., Disp: 90 tablet, Rfl: 3 .  nitroGLYCERIN (NITROSTAT) 0.4 MG SL tablet, Place 0.4 mg under the tongue every 5 (five) minutes as needed for chest pain., Disp: , Rfl:  .  omeprazole (PRILOSEC) 40 MG capsule, Take 40 mg by mouth daily., Disp: , Rfl:  .  rivaroxaban (XARELTO) 20 MG TABS tablet, Take 20 mg by mouth at bedtime., Disp: , Rfl:  .  rosuvastatin (CRESTOR) 20 MG tablet, Take 20 mg by mouth daily., Disp: , Rfl:  .  senna (SENOKOT) 8.6 MG tablet, Take 2 tablets by mouth as needed. , Disp: , Rfl:  .  spironolactone (ALDACTONE) 25 MG tablet, Take 0.5 tablets (12.5 mg total) by mouth daily., Disp: 45 tablet, Rfl: 3 .  tamsulosin (FLOMAX) 0.4 MG CAPS capsule, TAKE ONE CAPSULE BY MOUTH EVERY DAY AFTER SUPPER, Disp: 30 capsule, Rfl: 11 .  torsemide (DEMADEX) 20 MG tablet, 2 tablets (40 mg) in the AM and 1 tablet (20 mg) at lunch., Disp: 270 tablet, Rfl: 3 .  traZODone (DESYREL) 50 MG tablet, Take 100 mg by mouth at bedtime., Disp: , Rfl:  .  vortioxetine HBr (TRINTELLIX) 5 MG TABS, Take 5 mg by mouth daily. , Disp: , Rfl:   Past Medical History: Past Medical History:  Diagnosis Date  . Acid reflux   .  Anxiety   . Arthritis   . Atrial fibrillation ([place])   . CHF (congestive heart failure) ([place])   . Chronic orthostatic hypotension   . Clotting disorder ([place])   . COPD (chronic obstructive pulmonary disease) ([place])   . Depression   . Elevated PSA   . Heart attack ([place])   . Heart disease   . Heart failure ([place])   . Hepatitis   . High cholesterol   . Hypertension   . Sleep apnea   . TBI (traumatic brain injury) ([place])   . Urinary retention     Tobacco Use: Social History   Tobacco Use  Smoking Status Former Smoker  . Packs/day: 1.00  . Years: 40.00  . Pack years: 40.00  . Types: Cigarettes  . Last attempt to quit: 08/2015  . Years since quitting: 2.6  Smokeless Tobacco Never Used    Labs: Recent Review Scientist, physiological    Labs for ITP  Cardiac and Pulmonary Rehab Latest Ref Rng & Units 11/23/2017   Cholestrol 100 - 199 mg/dL 114   LDLCALC 0 - 99 mg/dL 60   HDL >39 mg/dL 30(L)   Trlycerides 0 - 149 mg/dL 119       Exercise Target Goals: Exercise Program Goal: Individual exercise prescription set using results from initial 6 min walk test and THRR while considering  patient's activity barriers and safety.   Exercise Prescription Goal: Initial exercise prescription builds to 30-45 minutes a day of aerobic activity, 2-3 days per week.  Home exercise guidelines will be given to patient during program as part of exercise prescription that the participant will acknowledge.  Activity Barriers & Risk Stratification: Activity Barriers & Cardiac Risk Stratification - 12/02/17 1054      Activity Barriers & Cardiac Risk Stratification   Activity Barriers  Deconditioning;Muscular Weakness;Shortness of Breath;Back Problems;Joint Problems;Decreased Ventricular Function;Balance Concerns;History of Falls;Neck/Spine Problems    Cardiac Risk Stratification  High       6 Minute Walk: 6 Minute Walk    Row Name 12/02/17 1052 03/15/18 0822       6 Minute Walk   Phase  Initial Using Pulmonary Admission Test from 09/20/17  Discharge    Distance  1002 feet  1125 feet    Distance % Change  23.8 %  12 %    Distance Feet Change  -  123 ft    Walk Time  6 minutes  6 minutes    # of Rest Breaks  0  0    MPH  1.9  2.13    METS  1.97  2.37    RPE  15  13    Perceived Dyspnea   2  3    VO2 Peak  6.88  8.3    Symptoms  No  No    Resting HR  70 bpm  77 bpm    Resting BP  128/56  148/74    Resting Oxygen Saturation   99 %  97 %    Exercise Oxygen Saturation  during 6 min walk  97 %  93 %    Max Ex. HR  104 bpm  103 bpm    Max Ex. BP  136/84  164/66    2 Minute Post BP  134/74  -      Interval HR   1 Minute HR  92  70    2 Minute HR  91  70    3 Minute HR  93  70  4 Minute HR  89  -    5 Minute HR  97  -    6 Minute HR  104  103     2 Minute Post HR  72  -    Interval Heart Rate?  Yes  Yes      Interval Oxygen   Interval Oxygen?  Yes  -    Baseline Oxygen Saturation %  99 %  0 %    1 Minute Oxygen Saturation %  97 %  95 %    1 Minute Liters of Oxygen  0 L Room Air  0 L    2 Minute Oxygen Saturation %  97 %  96 %    2 Minute Liters of Oxygen  0 L  0 L    3 Minute Oxygen Saturation %  97 %  96 %    3 Minute Liters of Oxygen  0 L  0 L    4 Minute Oxygen Saturation %  98 %  -    4 Minute Liters of Oxygen  0 L  0 L    5 Minute Oxygen Saturation %  97 %  -    5 Minute Liters of Oxygen  0 L  0 L    6 Minute Oxygen Saturation %  97 %  93 %    6 Minute Liters of Oxygen  0 L  0 L    2 Minute Post Oxygen Saturation %  99 %  -    2 Minute Post Liters of Oxygen  0 L  0 L       Oxygen Initial Assessment:   Oxygen Re-Evaluation:   Oxygen Discharge (Final Oxygen Re-Evaluation):   Initial Exercise Prescription: Initial Exercise Prescription - 12/02/17 1000      Date of Initial Exercise RX and Referring Provider   Date  12/02/17    Referring Provider  Ida Rogue MD      Treadmill   MPH  2    Grade  0    Minutes  15    METs  2.53      NuStep   Level  3    SPM  80    Minutes  15    METs  2.3      Arm Ergometer   Level  1    RPM  50    Minutes  15    METs  2      Prescription Details   Frequency (times per week)  3    Duration  Progress to 45 minutes of aerobic exercise without signs/symptoms of physical distress      Intensity   THRR 40-80% of Max Heartrate  104-138    Ratings of Perceived Exertion  11-13    Perceived Dyspnea  0-4      Progression   Progression  Continue to progress workloads to maintain intensity without signs/symptoms of physical distress.      Resistance Training   Training Prescription  Yes    Weight  4 lbs    Reps  10-15       Perform Capillary Blood Glucose checks as needed.  Exercise Prescription Changes: Exercise Prescription Changes    Row Name  12/23/17 0900 12/29/17 1500 01/14/18 0800 01/26/18 0900 02/08/18 1500     Response to Exercise   Blood Pressure (Admit)  -  146/78  122/84  118/72  132/72   Blood Pressure (Exercise)  -  132/60  144/88  136/70  140/74   Blood Pressure (Exit)  -  112/74  118/78  128/72  114/68   Heart Rate (Admit)  -  73 bpm  72 bpm  87 bpm  61 bpm   Heart Rate (Exercise)  -  112 bpm  95 bpm  112 bpm  107 bpm   Heart Rate (Exit)  -  74 bpm  63 bpm  74 bpm  70 bpm   Perceived Dyspnea (Exercise)  -  '13  11  12  13   '$ Symptoms  -  none  none  none  none   Duration  -  Continue with 30 min of aerobic exercise without signs/symptoms of physical distress.  Continue with 30 min of aerobic exercise without signs/symptoms of physical distress.  Continue with 30 min of aerobic exercise without signs/symptoms of physical distress.  Continue with 30 min of aerobic exercise without signs/symptoms of physical distress.   Intensity  -  THRR unchanged  THRR unchanged  THRR unchanged  THRR unchanged     Progression   Progression  -  Continue to progress workloads to maintain intensity without signs/symptoms of physical distress.  Continue to progress workloads to maintain intensity without signs/symptoms of physical distress.  Continue to progress workloads to maintain intensity without signs/symptoms of physical distress.  Continue to progress workloads to maintain intensity without signs/symptoms of physical distress.   Average METs  -  2.25  2.25  1.93  2.34     Resistance Training   Training Prescription  -  Yes  Yes  Yes  Yes   Weight  -  4 lbs  4 lb  4 lbs  4 lbs   Reps  -  10-15  10-15  10-15  -     Interval Training   Interval Training  -  No  No  No  -     Treadmill   MPH  -  -  -  1  -   Minutes  -  -  -  15  -   METs  -  -  -  1.77  -     NuStep   Level  -  '6  4  4  '$ -   Minutes  -  '15  15  15  '$ -   METs  -  3  -  2.1  -     Arm Ergometer   Level  -  1  -  -  -   Minutes  -  15  -  -  -   METs  -  1.9  -   -  -     Home Exercise Plan   Plans to continue exercise at  [employer] (comment) water aerobics at [employer] (comment) water aerobics at [employer] (comment) water aerobics at [employer] (comment) water aerobics at [place] 2 additional days to program exercise sessions.  Add 2 additional days to program exercise sessions.  Add 2 additional days to program exercise sessions.  Add 2 additional days to program exercise sessions.  -   Initial Home Exercises Provided  09/29/17  09/29/17  09/29/17  09/29/17  -   [place] Name 02/22/18 1500 03/08/18 1500 03/21/18 1600         Response to Exercise   Blood Pressure (Admit)  134/78  122/68  134/72     Blood Pressure (Exercise)  128/74  144/84  128/64     Blood Pressure (Exit)  120/72  124/70  136/64     Heart Rate (Admit)  77 bpm  65 bpm  60 bpm     Heart Rate (Exercise)  99 bpm  105 bpm  84 bpm     Heart Rate (Exit)  74 bpm  76 bpm  55 bpm     Perceived Dyspnea (Exercise)  '13  12  12     '$ Symptoms  none  none  none     Duration  Continue with 30 min of aerobic exercise without signs/symptoms of physical distress.  Continue with 30 min of aerobic exercise without signs/symptoms of physical distress.  Continue with 30 min of aerobic exercise without signs/symptoms of physical distress.     Intensity  THRR unchanged  THRR unchanged  THRR unchanged       Progression   Progression  Continue to progress workloads to maintain intensity without signs/symptoms of physical distress.  Continue to progress workloads to maintain intensity without signs/symptoms of physical distress.  Continue to progress workloads to maintain intensity without signs/symptoms of physical distress.     Average METs  2.6  2.37  2.03       Resistance Training   Training Prescription  Yes  Yes  Yes     Weight  4 lbs  5 lbs  5 lbs     Reps  10-15  10-15  10-15       Interval  Training   Interval Training  No  No  No       Treadmill   MPH  -  1  1.7     Grade  -  0  0     Minutes  -  15  15     METs  -  1.77  2.3       NuStep   Level  '6  6  7     '$ Minutes  '15  15  15     '$ METs  3  3.2  2       Arm Ergometer   Level  '1  2  2     '$ Minutes  '15  15  15     '$ METs  2.2  2.1  2       Home Exercise Plan   Plans to continue exercise at  [employer] [facility] [employer] (comment) water aerobics at [employer] (comment) water aerobics at [employer] (comment) water aerobics at [employer]  Add 2 additional days to program exercise sessions.  Add 2 additional days to program exercise sessions.  Add 2 additional days to program exercise sessions.     Initial Home Exercises Provided  09/29/17  09/29/17  09/29/17        Exercise Comments: Exercise Comments    Row Name 12/02/17 9211 03/24/18 0812         Exercise Comments  First full day of exercise!  Patient was oriented to gym and equipment including functions, settings, policies, and procedures.  Patient's individual exercise prescription and treatment plan were reviewed.  All starting workloads were established based on the results of the 6 minute walk test done at initial orientation visit.  The plan for exercise progression was also introduced and progression will be customized based on patient's performance and goals.  Kevin Pearson graduated today from  rehab with 35 sessions completed.  Details of the patient's exercise prescription and what He needs to do in order to continue the prescription and progress were discussed with patient.  Patient was given a copy of prescription and goals.  Patient verbalized understanding.  Kevin Pearson plans to continue to exercise by joining [employer].         Exercise Goals and Review: Exercise Goals    Row Name 12/02/17 1059             Exercise Goals   Increase Physical Activity  Yes       Intervention  Provide advice, education,  support and counseling about physical activity/exercise needs.;Develop an individualized exercise prescription for aerobic and resistive training based on initial evaluation findings, risk stratification, comorbidities and participant's personal goals.       Expected Outcomes  Short Term: Attend rehab on a regular basis to increase amount of physical activity.;Long Term: Add in home exercise to make exercise part of routine and to increase amount of physical activity.;Long Term: Exercising regularly at least 3-5 days a week.       Increase Strength and Stamina  Yes       Intervention  Provide advice, education, support and counseling about physical activity/exercise needs.;Develop an individualized exercise prescription for aerobic and resistive training based on initial evaluation findings, risk stratification, comorbidities and participant's personal goals.       Expected Outcomes  Short Term: Increase workloads from initial exercise prescription for resistance, speed, and METs.;Short Term: Perform resistance training exercises routinely during rehab and add in resistance training at home;Long Term: Improve cardiorespiratory fitness, muscular endurance and strength as measured by increased METs and functional capacity (6MWT)       Able to understand and use rate of perceived exertion (RPE) scale  Yes       Intervention  Provide education and explanation on how to use RPE scale       Expected Outcomes  Short Term: Able to use RPE daily in rehab to express subjective intensity level;Long Term:  Able to use RPE to guide intensity level when exercising independently       Able to understand and use Dyspnea scale  Yes       Intervention  Provide education and explanation on how to use Dyspnea scale       Expected Outcomes  Short Term: Able to use Dyspnea scale daily in rehab to express subjective sense of shortness of breath during exertion;Long Term: Able to use Dyspnea scale to guide intensity level when  exercising independently       Knowledge and understanding of Target Heart Rate Range (THRR)  Yes       Intervention  Provide education and explanation of THRR including how the numbers were predicted and where they are located for reference       Expected Outcomes  Short Term: Able to state/look up THRR;Long Term: Able to use THRR to govern intensity when exercising independently;Short Term: Able to use daily as guideline for intensity in rehab       Able to check Pearson independently  Yes       Intervention  Provide education and demonstration on how to check Pearson in carotid and radial arteries.;Review the importance of being able to check your own Pearson for safety during independent exercise       Expected Outcomes  Short Term: Able to explain why Pearson checking is important during independent exercise;Long  Term: Able to check Pearson independently and accurately       Understanding of Exercise Prescription  Yes       Intervention  Provide education, explanation, and written materials on patient's individual exercise prescription       Expected Outcomes  Short Term: Able to explain program exercise prescription;Long Term: Able to explain home exercise prescription to exercise independently          Exercise Goals Re-Evaluation : Exercise Goals Re-Evaluation    Row Name 12/02/17 0834 12/14/17 1428 12/23/17 0940 12/29/17 1523 01/14/18 0810     Exercise Goal Re-Evaluation   Exercise Goals Review  Increase Physical Activity;Increase Strength and Stamina;Able to understand and use rate of perceived exertion (RPE) scale  -  Increase Physical Activity;Able to understand and use rate of perceived exertion (RPE) scale;Knowledge and understanding of Target Heart Rate Range (THRR);Understanding of Exercise Prescription  Increase Physical Activity;Increase Strength and Stamina;Understanding of Exercise Prescription  Increase Physical Activity;Increase Strength and Stamina;Able to understand and use rate of  perceived exertion (RPE) scale   Comments  Reviewed RPE scale, THR and program prescription with pt today.  Pt voiced understanding and was given a copy of goals to take home.   Out since last review  Reviewed home exercise again to update for cardiac sessions.   Kevin Pearson has been doing well in rehab.  He is still on level 1 for the arm crank, but he forgets to try to make progression.   He has been avoiding the treadmill and we will make sure that he is able to get back on there.  We will continue to work on his progress and reminding him to advance his workloads.    Pt tolerates exercise well. Staff will encourage walking.     Expected Outcomes  Short: Use RPE daily to regulate intensity.  Long: Follow program prescription in THR.  -  Short: Add back in home exercise.  Long: Continue to increase phsyical activity  Short: Increase arm crank workloads.  Long: Continue to exercise at home.   Short - add TM back to sessions Long - increase MET level   Row Name 01/26/18 0037 02/01/18 0808 02/08/18 1511 02/22/18 1512 03/01/18 0827     Exercise Goal Re-Evaluation   Exercise Goals Review  Increase Physical Activity;Increase Strength and Stamina;Able to understand and use rate of perceived exertion (RPE) scale  Increase Physical Activity;Increase Strength and Stamina;Understanding of Exercise Prescription  Increase Physical Activity;Increase Strength and Stamina;Understanding of Exercise Prescription  Increase Physical Activity;Increase Strength and Stamina;Understanding of Exercise Prescription  Increase Physical Activity;Increase Strength and Stamina;Understanding of Exercise Prescription   Comments  Kevin Pearson returned yesterday after being out for over a week.  He was able to increase his workload on NuStep to level 5.  We will continue to monitor his progress.   Kevin Pearson has been moving.  He has not been doing any walking other than moving. He has been doing wall push ups.  As things start to settle back into place, he  is going to start getting back to his walking.  He belongs to [employer] and plans to get back in there.   Kevin Pearson has been out since last week.  He is still trying to move.   We will continue to encourage him to get back to [employer].  He is now on level 6 on the NuStep.  We will continue to monitor his progress.   Kevin Pearson has only attended once since last review.  He is hoping to return on Thursday.  We will continue to monitor his progression.   Kevin Pearson has not been able to walk very well since he hurt his foot moving, but it is starting to get better.  He has not been exercising at home.  He has quit attending [employer] and insurance is going to pay for him to come to [employer].     Expected Outcomes  Short: Continue to work on increasing more workloads.  Long: Continue to increase strength and stamina.   Short: Get back to home exercise routine.  Long: Continue to work on increasing physcial activity.   Short: Continue to increase workloads and attend regularly  Long: Continue to increase strength and stamina  Short: Continue to work on increasing workloads.  Long: Continue to exercise independently.   Short: Complete Financial controller paperwork.  Long: Continue to exercise independently.    [place] Name 03/08/18 1527 03/21/18 1630           Exercise Goal Re-Evaluation   Exercise Goals Review  Increase Physical Activity;Increase Strength and Stamina;Understanding of Exercise Prescription  Increase Physical Activity;Increase Strength and Stamina;Understanding of Exercise Prescription      Comments  Kevin Pearson has been doing well in rehab.  He is now up to 3.2 METs on the NuStep.  We will continue to monitor his progress.   Kevin Pearson will be graduating soon.  He improved his walk test by 123 ft!  He is planning to join [employer] after graduation.       Expected Outcomes  Short: Continue to attend class regularly to graduate. Long: Continue to exercise at gym.    Short: Graduate!!  Long: Continue to  exercise in [employer].          Discharge Exercise Prescription (Final Exercise Prescription Changes): Exercise Prescription Changes - 03/21/18 1600      Response to Exercise   Blood Pressure (Admit)  134/72    Blood Pressure (Exercise)  128/64    Blood Pressure (Exit)  136/64    Heart Rate (Admit)  60 bpm    Heart Rate (Exercise)  84 bpm    Heart Rate (Exit)  55 bpm    Perceived Dyspnea (Exercise)  12    Symptoms  none    Duration  Continue with 30 min of aerobic exercise without signs/symptoms of physical distress.    Intensity  THRR unchanged      Progression   Progression  Continue to progress workloads to maintain intensity without signs/symptoms of physical distress.    Average METs  2.03      Resistance Training   Training Prescription  Yes    Weight  5 lbs    Reps  10-15      Interval Training   Interval Training  No      Treadmill   MPH  1.7    Grade  0    Minutes  15    METs  2.3      NuStep   Level  7    Minutes  15    METs  2      Arm Ergometer   Level  2    Minutes  15    METs  2      Home Exercise Plan   Plans to continue exercise at  [employer] (comment)   water aerobics at [place] 2 additional days to program exercise sessions.  Initial Home Exercises Provided  09/29/17       Nutrition:  Target Goals: Understanding of nutrition guidelines, daily intake of sodium '1500mg'$ , cholesterol '200mg'$ , calories 30% from fat and 7% or less from saturated fats, daily to have 5 or more servings of fruits and vegetables.  Biometrics: Pre Biometrics - 12/02/17 1101      Pre Biometrics   Height  5' 7.4" (1.712 m)    Weight  256 lb 12.8 oz (116.5 kg)    Waist Circumference  45 inches    Hip Circumference  46 inches    Waist to Hip Ratio  0.98 %    BMI (Calculated)  39.74      Post Biometrics - 03/15/18 0821       Post  Biometrics   Height  5' 7.4" (1.712 m)    Weight  264 lb (119.7 kg)    Waist Circumference   45 inches    Hip Circumference  46 inches    Waist to Hip Ratio  0.98 %    BMI (Calculated)  40.86       Nutrition Therapy Plan and Nutrition Goals:   Nutrition Assessments: Nutrition Assessments - 03/15/18 0827      MEDFICTS Scores   Pre Score  67    Post Score  84    Score Difference  17       Nutrition Goals Re-Evaluation: Nutrition Goals Re-Evaluation    Row Name 01/13/18 0811 02/01/18 0821 02/03/18 0920 03/01/18 0832 03/17/18 1023     Goals   Nutrition Goal  Look for ways to include additional servings of vegetables in your daily diet. Steaming, sauteeing, grilling, and roasting are all great cooking methods for them.  Continue to work on portion control and don't skip meals.   Continue to work on eating on a regular schedule, trying not to skip meals  Continue to work on eating on a regular schedule, trying not to skip meals, work on weight loss  Eat meals on a regular schedule to avoid skipping meals and better control hunger and portions at meal times. Increase intake of vegetables and cook more meals at home. Consider moving forward with joining [employer] program   Comment  He has been trying to include more vegetables in his diet, he is hit or miss wth this. He sees a RD every month  He has had a lot going on.  He has started to skip some meals.  He continues to see his nutritionist every month.    He continues to have an irregular eating schedule d/t current health, Kevin Pearson and personal responsibilities and concerns  [place] still wants to join [employer].  He still likes carbs and eating larger portions.  He also has not been taking in enough vegetables.  He is able to identify his problems, but has not made an effort to make changes.  He is cooking some at home.   He continues to cook meals at home more often but still struggles with eating on a set schedule. He eats an early breakfast and then may not eat again until supper. He has not started the [facility] program but is still  considering it; he is joining the [employer] however for continued support after graduating. He is also considering joining [employer] to increase his resistance training   Expected Outcome  He will include servings of vegetables daily when planning meals work on portion control.  Short: Continue to work on portion control.  Long: Don't skip meals and eat more at home.   He will eat regularly throughout the day, keeping sodium intake and portion control in mind  Short: Continue to work on weight loss and focus in on portion control.  Long: Continue to work on heart healthy eating.   He will continue to cook at home more often and be active on a regular basis. He will continue to work on eating on a more consistent schedule and choose balanced meals that include lean protein, vegetables and smaller portions of starches     Personal Goal #2 Re-Evaluation   Personal Goal #2  Pay closer attention to portion sizes at meal times, particularly starchy foods. Attempting to work on this. Cooks at home eats out once a week.  -  Weight reduction; he is considering joining [employer] once he moves, and joining [employer] and times for nearby locations provided  -  -      Nutrition Goals Discharge (Final Nutrition Goals Re-Evaluation): Nutrition Goals Re-Evaluation - 03/17/18 1023      Goals   Nutrition Goal  Eat meals on a regular schedule to avoid skipping meals and better control hunger and portions at meal times. Increase intake of vegetables and cook more meals at home. Consider moving forward with joining [employer] program    Comment  He continues to cook meals at home more often but still struggles with eating on a set schedule. He eats an early breakfast and then may not eat again until supper. He has not started the [facility] program but is still considering it; he is joining the [employer] however for continued support after graduating. He is also considering joining [employer] to  increase his resistance training    Expected Outcome  He will continue to cook at home more often and be active on a regular basis. He will continue to work on eating on a more consistent schedule and choose balanced meals that include lean protein, vegetables and smaller portions of starches       Psychosocial: Target Goals: Acknowledge presence or absence of significant depression and/or stress, maximize coping skills, provide positive support system. Participant is able to verbalize types and ability to use techniques and skills needed for reducing stress and depression.   Initial Review & Psychosocial Screening:   Quality of Life Scores:  Quality of Life - 03/15/18 0824      Quality of Life   Select  Quality of Life      Quality of Life Scores   Health/Function Pre  7.8 %    Health/Function Post  17.27 %    Health/Function % Change  121.41 %    Socioeconomic Pre  16.83 %    Socioeconomic Post  23.58 %    Socioeconomic % Change   40.11 %    Psych/Spiritual Pre  19.64 %    Psych/Spiritual Post  19.64 %    Psych/Spiritual % Change  0 %    Kevin Pearson Pre  15.75 %    Kevin Pearson Post  20 %    Kevin Pearson % Change  26.98 %    GLOBAL Pre  13.08 %    GLOBAL Post  19.31 %    GLOBAL % Change  47.63 %      Scores of 19 and below usually indicate a poorer quality of life in these areas.  A difference of  2-3 points is a clinically meaningful difference.  A difference of 2-3 points in the  total score of the Quality of Life Index has been associated with significant improvement in overall quality of life, self-image, physical symptoms, and general health in studies assessing change in quality of life.  PHQ-9: Recent Review Flowsheet Data    Depression screen [facility] 2/9 03/15/2018 12/27/2017 12/02/2017 10/06/2017 09/23/2017   Decreased Interest 1 0 2 2 -   Down, Depressed, Hopeless 1 0 '2 1 1   '$ PHQ - 2 Score 2 0 '4 3 1   '$ Altered sleeping 1 - 1 2 -   Tired, decreased energy 1 - 1 2 -   Change in appetite 1 -  1 2 -   Feeling bad or failure about yourself  1 - 1 2 -   Trouble concentrating 1 - 1 1 -   Moving slowly or fidgety/restless 3 - 1 1 -   Suicidal thoughts 0 - 0 1 0   PHQ-9 Score 10 - 10 14 -   Difficult doing work/chores Somewhat difficult - Somewhat difficult - -     Interpretation of Total Score  Total Score Depression Severity:  1-4 = Minimal depression, 5-9 = Mild depression, 10-14 = Moderate depression, 15-19 = Moderately severe depression, 20-27 = Severe depression   Psychosocial Evaluation and Intervention: Psychosocial Evaluation - 12/28/17 0939      Psychosocial Evaluation & Interventions   Comments  Kevin Pearson) returned to Cardiac Rehab due to increased pulmonary and cardiac difficulties.  Counselor met with him today for updated psychosocial evaluation.  He is 67 years old and has a limited support system currently - reporting his daughter wants to "put him in a nursing home."  He is sleeping better now with 6-7 hours with the use of medication and his appetite is good.  Kevin Pearson reports a history of depression and anxiety and states his current medications for this are working well as his mood is generally positive and less irritable.  Stress in his life are his health and Kevin Pearson conflict.  He has goals to increase his ability to breathe and be able to do more activities.  He will be followed by staff.      Expected Outcomes  Short:  Kevin Pearson will improve his breathing with exercise.   Long:  Kevin Pearson will cope more positively with skills learned and increase his support system for more positive interactions.      Continue Psychosocial Services   Follow up required by staff       Psychosocial Re-Evaluation: Psychosocial Re-Evaluation    Jane Lew Name 01/13/18 0814 02/01/18 0817 03/01/18 0830         Psychosocial Re-Evaluation   Current issues with  Current Sleep Concerns  Current Stress Concerns  Current Stress Concerns     Comments  -  Kevin Pearson has been out with a death in  his Kevin Pearson and dealing with the fall out from that.  He has also been working on moving as well. He will need to follow up with counselor.  He has also had his dog sick.   He is also toying with the idea about moving his mother hear.   Kevin Pearson continues to have a lot going on in his life. He is now nursing a sore foot from moving.  He is working on his plans for after graduation to continue to exercise.  He still needs to follow up with Kevin Pearson again.  He is making a good effort to get in here each day.      Expected Outcomes  -  Short: Meet with Kevin Pearson.  Settle into new home.  Long: Continue to work on Radiographer, therapeutic.  Short: Meet with Kevin Pearson.  Long: Continue to work on Doctor, general practice.      Interventions  -  Stress management education;Encouraged to attend Cardiac Rehabilitation for the exercise  Stress management education;Encouraged to attend Cardiac Rehabilitation for the exercise     Continue Psychosocial Services   -  Follow up required by counselor  Follow up required by counselor     Comments  -  He uses medication to help him sleep and sees a psychologist.  -       Initial Review   Source of Stress Concerns  -  Chronic Illness;Unable to participate in former interests or hobbies;Kevin Pearson;Financial  Chronic Illness;Unable to participate in former interests or hobbies;Kevin Pearson;Financial        Psychosocial Discharge (Final Psychosocial Re-Evaluation): Psychosocial Re-Evaluation - 03/01/18 0830      Psychosocial Re-Evaluation   Current issues with  Current Stress Concerns    Comments  Kevin Pearson continues to have a lot going on in his life. He is now nursing a sore foot from moving.  He is working on his plans for after graduation to continue to exercise.  He still needs to follow up with Kevin Pearson again.  He is making a good effort to get in here each day.     Expected Outcomes  Short: Meet with Kevin Pearson.  Long: Continue to work on Doctor, general practice.     Interventions  Stress management  education;Encouraged to attend Cardiac Rehabilitation for the exercise    Continue Psychosocial Services   Follow up required by counselor      Initial Review   Source of Stress Concerns  Chronic Illness;Unable to participate in former interests or hobbies;Kevin Pearson;Financial       Vocational Rehabilitation: Provide vocational rehab assistance to qualifying candidates.   Vocational Rehab Evaluation & Intervention:   Education: Education Goals: Education classes will be provided on a variety of topics geared toward better understanding of heart health and risk factor modification. Participant will state understanding/return demonstration of topics presented as noted by education test scores.  Learning Barriers/Preferences: Learning Barriers/Preferences - 12/02/17 1109      Learning Barriers/Preferences   Learning Barriers  Hearing;Sight    Learning Preferences  Skilled Demonstration;Written Material;Computer/Internet;Group Instruction;Verbal Instruction       Education Topics:  AED/CPR: - Group verbal and written instruction with the use of models to demonstrate the basic use of the AED with the basic ABC's of resuscitation.   Cardiac Rehab from 03/24/2018 in [facility] Cardiac and Pulmonary Rehab  Date  03/22/18  Educator  KS  Instruction Review Code  1- Verbalizes Understanding      General Nutrition Guidelines/Fats and Fiber: -Group instruction provided by verbal, written material, models and posters to present the general guidelines for heart healthy nutrition. Gives an explanation and review of dietary fats and fiber.   Cardiac Rehab from 03/24/2018 in [facility] Cardiac and Pulmonary Rehab  Date  03/15/18  Educator  LB  Instruction Review Code  1- Verbalizes Understanding      Controlling Sodium/Reading Food Labels: -Group verbal and written material supporting the discussion of sodium use in heart healthy nutrition. Review and explanation with models, verbal and written materials  for utilization of the food label.   Cardiac Rehab from 03/24/2018 in [facility] Cardiac and Pulmonary Rehab  Date  03/24/18  Educator  LB  Instruction Review Code  1- Verbalizes Understanding  Exercise Physiology & General Exercise Guidelines: - Group verbal and written instruction with models to review the exercise physiology of the cardiovascular system and associated critical values. Provides general exercise guidelines with specific guidelines to those with heart or lung disease.    Cardiac Rehab from 03/24/2018 in [facility] Cardiac and Pulmonary Rehab  Date  02/01/18  Educator  [facility]  Instruction Review Code  1- Verbalizes Understanding      Aerobic Exercise & Resistance Training: - Gives group verbal and written instruction on the various components of exercise. Focuses on aerobic and resistive training programs and the benefits of this training and how to safely progress through these programs..   Cardiac Rehab from 03/24/2018 in [facility] Cardiac and Pulmonary Rehab  Date  02/10/18  Educator  AS  Instruction Review Code  1- Verbalizes Understanding      Flexibility, Balance, Mind/Body Relaxation: Provides group verbal/written instruction on the benefits of flexibility and balance training, including mind/body exercise modes such as yoga, pilates and tai chi.  Demonstration and skill practice provided.   Cardiac Rehab from 03/24/2018 in [facility] Cardiac and Pulmonary Rehab  Date  12/23/17  Educator  AS  Instruction Review Code  1- Verbalizes Understanding      Stress and Anxiety: - Provides group verbal and written instruction about the health risks of elevated stress and causes of high stress.  Discuss the correlation between heart/lung disease and anxiety and treatment options. Review healthy ways to manage with stress and anxiety.   Cardiac Rehab from 10/27/2016 in [facility] Cardiac and Pulmonary Rehab  Date  08/13/16  Educator  TS  Instruction Review Code (retired)  2- meets goals/outcomes       Depression: - Provides group verbal and written instruction on the correlation between heart/lung disease and depressed mood, treatment options, and the stigmas associated with seeking treatment.   Cardiac Rehab from 03/24/2018 in [facility] Cardiac and Pulmonary Rehab  Date  11/17/17  Educator  [facility]  Instruction Review Code  1- Verbalizes Understanding      Anatomy & Physiology of the Heart: - Group verbal and written instruction and models provide basic cardiac anatomy and physiology, with the coronary electrical and arterial systems. Review of Valvular disease and Heart Failure   Cardiac Rehab from 03/24/2018 in [facility] Cardiac and Pulmonary Rehab  Date  02/24/18  Educator  CE  Instruction Review Code  1- Verbalizes Understanding      Cardiac Procedures: - Group verbal and written instruction to review commonly prescribed medications for heart disease. Reviews the medication, class of the drug, and side effects. Includes the steps to properly store meds and maintain the prescription regimen. (beta blockers and nitrates)   Cardiac Rehab from 03/24/2018 in [facility] Cardiac and Pulmonary Rehab  Date  03/17/18  Educator  CE  Instruction Review Code  1- Verbalizes Understanding      Cardiac Medications I: - Group verbal and written instruction to review commonly prescribed medications for heart disease. Reviews the medication, class of the drug, and side effects. Includes the steps to properly store meds and maintain the prescription regimen.   Cardiac Rehab from 03/24/2018 in [facility] Cardiac and Pulmonary Rehab  Date  03/01/18  Educator  SB  Instruction Review Code  1- Verbalizes Understanding      Cardiac Medications II: -Group verbal and written instruction to review commonly prescribed medications for heart disease. Reviews the medication, class of the drug, and side effects. (all other drug classes)   Cardiac Rehab from 03/24/2018  in [facility] Cardiac and Pulmonary Rehab  Date  12/16/17   Educator  CE  Instruction Review Code  1- Verbalizes Understanding       Go Sex-Intimacy & Heart Disease, Get SMART - Goal Setting: - Group verbal and written instruction through game format to discuss heart disease and the return to sexual intimacy. Provides group verbal and written material to discuss and apply goal setting through the application of the S.M.A.R.T. Method.   Cardiac Rehab from 03/24/2018 in [facility] Cardiac and Pulmonary Rehab  Date  03/17/18  Educator  CE  Instruction Review Code  1- Verbalizes Understanding      Other Matters of the Heart: - Provides group verbal, written materials and models to describe Stable Angina and Peripheral Artery. Includes description of the disease process and treatment options available to the cardiac patient.   Cardiac Rehab from 03/24/2018 in [facility] Cardiac and Pulmonary Rehab  Date  02/24/18  Educator  CE  Instruction Review Code  1- Verbalizes Understanding      Exercise & Equipment Safety: - Individual verbal instruction and demonstration of equipment use and safety with use of the equipment.   Cardiac Rehab from 03/24/2018 in [facility] Cardiac and Pulmonary Rehab  Date  09/20/17  Educator  [facility]  Instruction Review Code  1- Verbalizes Understanding      Infection Prevention: - Provides verbal and written material to individual with discussion of infection control including proper hand washing and proper equipment cleaning during exercise session.   Cardiac Rehab from 03/24/2018 in [facility] Cardiac and Pulmonary Rehab  Date  09/20/17  Educator  [facility]  Instruction Review Code  1- Verbalizes Understanding      Falls Prevention: - Provides verbal and written material to individual with discussion of falls prevention and safety.   Cardiac Rehab from 03/24/2018 in [facility] Cardiac and Pulmonary Rehab  Date  09/20/17  Educator  [facility]  Instruction Review Code  1- Verbalizes Understanding      Diabetes: - Individual verbal and written instruction to  review signs/symptoms of diabetes, desired ranges of glucose level fasting, after meals and with exercise. Acknowledge that pre and post exercise glucose checks will be done for 3 sessions at entry of program.   Know Your Numbers and Risk Factors: -Group verbal and written instruction about important numbers in your health.  Discussion of what are risk factors and how they play a role in the disease process.  Review of Cholesterol, Blood Pressure, Diabetes, and BMI and the role they play in your overall health.   Cardiac Rehab from 03/24/2018 in [facility] Cardiac and Pulmonary Rehab  Date  12/16/17  Educator  CE  Instruction Review Code  1- Verbalizes Understanding      Sleep Hygiene: -Provides group verbal and written instruction about how sleep can affect your health.  Define sleep hygiene, discuss sleep cycles and impact of sleep habits. Review good sleep hygiene tips.    Cardiac Rehab from 03/24/2018 in [facility] Cardiac and Pulmonary Rehab  Date  02/03/18  Educator  [facility]  Instruction Review Code  1- Verbalizes Understanding      Other: -Provides group and verbal instruction on various topics (see comments)   Knowledge Questionnaire Score: Knowledge Questionnaire Score - 03/15/18 1355      Knowledge Questionnaire Score   Pre Score  24/26    Post Score  24/26   test reviewed with pt today      Core Components/Risk Factors/Patient Goals at Admission: Personal Goals and Risk Factors  at Admission - 12/02/17 1102      Core Components/Risk Factors/Patient Goals on Admission    Weight Management  Yes;Obesity;Weight Loss    Intervention  Weight Management: Develop a combined nutrition and exercise program designed to reach desired caloric intake, while maintaining appropriate intake of nutrient and fiber, sodium and fats, and appropriate energy expenditure required for the weight goal.;Weight Management: Provide education and appropriate resources to help participant work on and attain dietary  goals.;Weight Management/Obesity: Establish reasonable short term and long term weight goals.;Obesity: Provide education and appropriate resources to help participant work on and attain dietary goals.    Admit Weight  256 lb 12.8 oz (116.5 kg)    Goal Weight: Short Term  252 lb (114.3 kg)    Goal Weight: Long Term  180 lb (81.6 kg)    Expected Outcomes  Short Term: Continue to assess and modify interventions until short term weight is achieved;Long Term: Adherence to nutrition and physical activity/exercise program aimed toward attainment of established weight goal;Weight Loss: Understanding of general recommendations for a balanced deficit meal plan, which promotes 1-2 lb weight loss per week and includes a negative energy balance of [phone] kcal/d;Understanding recommendations for meals to include 15-35% energy as protein, 25-35% energy from fat, 35-60% energy from carbohydrates, less than '200mg'$  of dietary cholesterol, 20-35 gm of total fiber daily;Understanding of distribution of calorie intake throughout the day with the consumption of 4-5 meals/snacks    Improve shortness of breath with ADL's  Yes    Intervention  Provide education, individualized exercise plan and daily activity instruction to help decrease symptoms of SOB with activities of daily living.    Expected Outcomes  Short Term: Improve cardiorespiratory fitness to achieve a reduction of symptoms when performing ADLs;Long Term: Be able to perform more ADLs without symptoms or delay the onset of symptoms    Heart Failure  Yes    Intervention  Provide a combined exercise and nutrition program that is supplemented with education, support and counseling about heart failure. Directed toward relieving symptoms such as shortness of breath, decreased exercise tolerance, and extremity edema.    Expected Outcomes  Improve functional capacity of life;Short term: Attendance in program 2-3 days a week with increased exercise capacity. Reported lower  sodium intake. Reported increased fruit and vegetable intake. Reports medication compliance.;Short term: Daily weights obtained and reported for increase. Utilizing diuretic protocols set by physician.;Long term: Adoption of self-care skills and reduction of barriers for early signs and symptoms recognition and intervention leading to self-care maintenance.    Hypertension  Yes    Intervention  Provide education on lifestyle modifcations including regular physical activity/exercise, weight management, moderate sodium restriction and increased consumption of fresh fruit, vegetables, and low fat dairy, alcohol moderation, and smoking cessation.;Monitor prescription use compliance.    Expected Outcomes  Short Term: Continued assessment and intervention until BP is < 140/66m HG in hypertensive participants. < 130/867mHG in hypertensive participants with diabetes, heart failure or chronic kidney disease.;Long Term: Maintenance of blood pressure at goal levels.    Lipids  Yes    Intervention  Provide education and support for participant on nutrition & aerobic/resistive exercise along with prescribed medications to achieve LDL '70mg'$ , HDL >'40mg'$ .    Expected Outcomes  Short Term: Participant states understanding of desired cholesterol values and is compliant with medications prescribed. Participant is following exercise prescription and nutrition guidelines.;Long Term: Cholesterol controlled with medications as prescribed, with individualized exercise RX and with personalized nutrition plan. Value goals:  LDL < '70mg'$ , HDL > 40 mg.    Stress  Yes    Intervention  Offer individual and/or small group education and counseling on adjustment to heart disease, stress management and health-related lifestyle change. Teach and support self-help strategies.;Refer participants experiencing significant psychosocial distress to appropriate mental health specialists for further evaluation and treatment. When possible, include  Kevin Pearson members and significant others in education/counseling sessions.    Expected Outcomes  Short Term: Participant demonstrates changes in health-related behavior, relaxation and other stress management skills, ability to obtain effective social support, and compliance with psychotropic medications if prescribed.;Long Term: Emotional wellbeing is indicated by absence of clinically significant psychosocial distress or social isolation.       Core Components/Risk Factors/Patient Goals Review:  Goals and Risk Factor Review    Row Name 02/01/18 [id number] 03/01/18 0836           Core Components/Risk Factors/Patient Goals Review   Personal Goals Review  Weight Management/Obesity;Lipids;Hypertension;Heart Failure  Weight Management/Obesity;Lipids;Hypertension;Heart Failure      Review  Kevin Pearson's weight is up today to 262 lbs.  He has a lot of fluid on board.  He is working with his doctors to find a new balance.  He is having some swelling with this as well.  His kidney function is off.   His blood pressures have been good.  He does not check it at home as he doesn't like the cuff.  He is planning to get a new one.   Kevin Pearson's weight continues to go up and down. He is still fluid overloaded.  He continues to work on finding a balance.  He continues to have good pressures.  He did get a new cuff and is checking it twice a week at home.  He is now weighing daily again.       Expected Outcomes  Short: Get new blood pressure cuff.  Long: Continue to work on heart failure and getting fluid off.   Short: Continue to weigh daily.  Long: Continue to monitor heart failure.          Core Components/Risk Factors/Patient Goals at Discharge (Final Review):  Goals and Risk Factor Review - 03/01/18 0836      Core Components/Risk Factors/Patient Goals Review   Personal Goals Review  Weight Management/Obesity;Lipids;Hypertension;Heart Failure    Review  Kevin Pearson's weight continues to go up and down. He is still fluid  overloaded.  He continues to work on finding a balance.  He continues to have good pressures.  He did get a new cuff and is checking it twice a week at home.  He is now weighing daily again.     Expected Outcomes  Short: Continue to weigh daily.  Long: Continue to monitor heart failure.        ITP Comments: ITP Comments    Row Name Dec 16, 2017 1049 12/14/17 1428 12/22/17 0604 01/19/18 0545 02/01/18 0807   ITP Comments  Kevin Pearson started in Cardiac Rehab today.  Documentation for diagnosis can be found in [facility] enounter from 5/21 and admission from 5/15.  New Cardaic ITP created and sent to Dr. Emily Filbert, Medical Director for review and signature.   Called to check on pt as he has been out since 2022-12-17.  He had a death in the Kevin Pearson and needed to go to [place]. He also took time to visit with his Kevin Pearson. 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  He has also been sick.  He is  hoping to try to come in on Thursday.   30 day review. Continue with ITP unless directed changes per Medical Director review  30 day review. Continue with ITP unless directed changes per Medical Director review.   One session completed in July.  Kevin Pearson funeral [place] of state  Kevin Pearson was out with a death in the Kevin Pearson. He moved from [facility] to [place]. Address updated   [place] Name 02/16/18 0809 02/22/18 1512 03/16/18 0619 03/24/18 0812     ITP Comments  30 day review completed. ITP sent to Dr. Ramonita Lab, covering for Dr. Emily Filbert, Medical Director of Cardiac Rehab. Continue with ITP unless changes are made by physician  Called to check on pt.  He has been out with lots of things going on at home between moving and dealing with Kevin Pearson.  He is hoping to return on Thursday.   30 day review completed. ITP sent to Dr. Emily Filbert, Medical Director of Cardiac Rehab. Continue with ITP unless changes are made by physician  Discharge ITP sent and signed by Dr. Sabra Heck.  Discharge Summary routed to PCP and cardiologist.        Comments: Discharge ITP

## 2018-03-24 NOTE — Progress Notes (Signed)
Daily Session Note  Patient Details  Name: Kevin Pearson MRN: [id number] Date of Birth: Jan 26, 1951 Referring Provider:     Cardiac Rehab from 12/02/2017 in [facility]  Referring Provider  Ida Rogue MD      Encounter Date: 03/24/2018  Check In: Session Check In - 03/24/18 0811      Check-In   Supervising physician immediately available to respond to emergencies  See telemetry face sheet for immediately available ER MD    Location  [facility]    Staff Present  Alberteen Sam, MA, RCEP, CCRP, Exercise Physiologist;Amanda Oletta Darter, BA, ACSM CEP, Exercise Physiologist;Carroll Enterkin, RN, BSN    Medication changes reported      No    Fall or balance concerns reported     No    Warm-up and Cool-down  Performed on first and last piece of equipment    Resistance Training Performed  Yes    VAD Patient?  No    PAD/SET Patient?  No      Pain Assessment   Currently in Pain?  No/denies          Social History   Tobacco Use  Smoking Status Former Smoker  . Packs/day: 1.00  . Years: 40.00  . Pack years: 40.00  . Types: Cigarettes  . Last attempt to quit: 08/2015  . Years since quitting: 2.6  Smokeless Tobacco Never Used    Goals Met:  Independence with exercise equipment Exercise tolerated well Personal goals reviewed No report of cardiac concerns or symptoms Strength training completed today  Goals Unmet:  Not Applicable  Comments:  Rad graduated today from  rehab with 35 sessions completed.  Details of the patient's exercise prescription and what He needs to do in order to continue the prescription and progress were discussed with patient.  Patient was given a copy of prescription and goals.  Patient verbalized understanding.  Dewarren plans to continue to exercise by joining [employer].    Dr. Emily Filbert is Medical Director for [place] [name].

## 2018-03-29 LAB — CUP PACEART REMOTE DEVICE CHECK
Battery Remaining Longevity: 36 mo
Battery Remaining Percentage: 40 %
Battery Voltage: 2.92 V
Date Time Interrogation Session: 20190820080017
HighPow Impedance: 68 Ohm
HighPow Impedance: 68 Ohm
Implantable Lead Implant Date: 20131003
Implantable Lead Implant Date: 20131003
Implantable Lead Location: 753858
Implantable Lead Location: 753860
Implantable Pulse Generator Implant Date: 20131003
Lead Channel Impedance Value: 480 Ohm
Lead Channel Impedance Value: 950 Ohm
Lead Channel Pacing Threshold Amplitude: 0.625 V
Lead Channel Pacing Threshold Amplitude: 1.25 V
Lead Channel Pacing Threshold Pulse Width: 0.5 ms
Lead Channel Pacing Threshold Pulse Width: 0.6 ms
Lead Channel Sensing Intrinsic Amplitude: 12 mV
Lead Channel Setting Pacing Amplitude: 2 V
Lead Channel Setting Pacing Amplitude: 2.5 V
Lead Channel Setting Pacing Pulse Width: 0.5 ms
Lead Channel Setting Pacing Pulse Width: 0.6 ms
Lead Channel Setting Sensing Sensitivity: 0.5 mV
Pulse Gen Serial Number: 7055215

## 2018-04-15 ENCOUNTER — Telehealth: Payer: Self-pay | Admitting: Cardiovascular Disease

## 2018-04-15 NOTE — Telephone Encounter (Signed)
Surgery not scheduled/urgent. Please forward to provider that saw patient.

## 2018-04-15 NOTE — Telephone Encounter (Signed)
° °  [facility] Pre-operative Risk Assessment    Request for surgical clearance:  1. What type of surgery is being performed? Ext #2,4,5,6,12,13  2. When is this surgery scheduled? Not noted   3. What type of clearance is required (medical clearance vs. Pharmacy clearance to hold med vs. Both)? Both   4. Are there any medications that need to be held prior to surgery and how long? xarelto please advise   5. Practice name and name of physician performing surgery?  [facility]   6. What is your office phone number [phone]   7.   What is your office fax number [phone]  8.   Anesthesia type (None, local, MAC, general) ? local   Clarisse Gouge 04/15/2018, 3:50 PM  _________________________________________________________________   (provider comments below)

## 2018-04-15 NOTE — Telephone Encounter (Signed)
Ok to hold Xarelto 2 days prior to procedure with recommendation to resume as soon as safely possible to minimize risk of stroke. It appears patient is also on Plavix with numerous reported stents, none within the past 12 months. Oral surgery is unlikely to proceed with multiple extractions while on Plavix. We will need to clarify this with their office and discuss with primary cardiologist or DOD to see if he should be placed on ASA in the perioperative period. Medical clearance deferred to PCP.

## 2018-04-18 NOTE — Telephone Encounter (Signed)
I called and spoke with staff at [facility]. They did confirm the patient will need to come off plavix prior to extractions. They did not have this listed on his medication list.  I advised we will review with MD to see if ASA will be necessary in the interim then get his clearance sent over.   To Dr. Mariah Milling to review.

## 2018-04-19 NOTE — Telephone Encounter (Signed)
Agree, hold xarelto 2 days prior, restart following procedure Hold plavix 5 days before Start asa 81 mg daily when not on plavix Change back to plavix following procedure, then hold asa

## 2018-04-20 ENCOUNTER — Ambulatory Visit (INDEPENDENT_AMBULATORY_CARE_PROVIDER_SITE_OTHER): Payer: Medicare Other | Admitting: Urology

## 2018-04-20 ENCOUNTER — Encounter: Payer: Self-pay | Admitting: Urology

## 2018-04-20 VITALS — BP 125/71 | HR 70 | Ht 67.5 in | Wt 268.5 lb

## 2018-04-20 DIAGNOSIS — N320 Bladder-neck obstruction: Secondary | ICD-10-CM

## 2018-04-20 DIAGNOSIS — R339 Retention of urine, unspecified: Secondary | ICD-10-CM

## 2018-04-20 LAB — BLADDER SCAN AMB NON-IMAGING: Scan Result: 189

## 2018-04-20 NOTE — Progress Notes (Signed)
04/20/2018 3:54 PM   Kevin Pearson May 09, 1951 [id number]  Referring provider: Marygrace Drought, MD [address] [facility] [place], [place] [address]  Chief Complaint  Patient presents with  . Follow-up    HPI: 67 yo M with ischemic cardiomyopathy, CAD s/p PCI/ stent/ ICD, MI x multiple, afib on anticoagulation/ antiplatelet therapy with severe bladder neck contracture s/p emergent urethral dilation under MAC  in the setting of urinary retention on 06/25/16 and more recently serial dilation in the office in 07/2017 after recurrence.  Review of medical records indicate that he had an in office laser vaporization of the prostate on 06/26/2015 by Dr. Fanny Skates.  It is unclear from the note what sort of laser was used, settings, etc.  He then developed a significant bladder neck contracture with plans for surgical management of this but unable to proceed secondary to local comorbidities and cardiac status.  Since January after his catheter was removed, he has been more compliant with daily self cath to keep his bladder neck patent.  Initially he was doing this once daily with a 16 French straight cath but now has backed off to twice weekly which he states is better for him and is able to pass the catheter easily.  He feels like his stream is adequate.  He feels like he is not emptying his bladder completely but satisfactorily.  He does have a personal history of CHF and is on furosemide for diuresis exacerbates his urinary urgency and frequency.  IPSS as below.   No dysuria or gross hematuria.  He is currently on Flomax.    IPSS    Row Name 04/20/18 1000         International Prostate Symptom Score   How often have you had the sensation of not emptying your bladder?  More than half the time     How often have you had to urinate less than every two hours?  More than half the time     How often have you found you stopped and started again several times when  you urinated?  About half the time     How often have you found it difficult to postpone urination?  More than half the time     How often have you had a weak urinary stream?  Less than half the time     How often have you had to strain to start urination?  Less than 1 in 5 times     How many times did you typically get up at night to urinate?  2 Times     Total IPSS Score  20       Quality of Life due to urinary symptoms   If you were to spend the rest of your life with your urinary condition just the way it is now how would you feel about that?  Terrible        Score:  1-7 Mild 8-19 Moderate 20-35 Severe   PMH: Past Medical History:  Diagnosis Date  . Acid reflux   . Anxiety   . Arthritis   . Atrial fibrillation ([place])   . CHF (congestive heart failure) ([place])   . Chronic orthostatic hypotension   . Clotting disorder ([place])   . COPD (chronic obstructive pulmonary disease) ([place])   . Depression   . Elevated PSA   . Heart attack ([place])   . Heart disease   . Heart failure ([place])   . Hepatitis   .  High cholesterol   . Hypertension   . Sleep apnea   . TBI (traumatic brain injury) ([place])   . Urinary retention     Surgical History: Past Surgical History:  Procedure Laterality Date  . BLADDER SURGERY    . cardiac stents    . CYSTOSCOPY WITH URETHRAL DILATATION N/A 06/25/2016   Procedure: CYSTOSCOPY WITH URETHRAL DILATATION;  Surgeon: Hollice Espy, MD;  Location: [facility];  Service: Urology;  Laterality: N/A;  . EP IMPLANTABLE DEVICE     [employer] Jude BiV-ICD  . GREEN LIGHT LASER TURP (TRANSURETHRAL RESECTION OF PROSTATE  2016   done in [place]   . LEFT HEART CATH AND CORONARY ANGIOGRAPHY Left 10/27/2017   Procedure: LEFT HEART CATH AND CORONARY ANGIOGRAPHY;  Surgeon: Minna Merritts, MD;  Location: [facility];  Service: Cardiovascular;  Laterality: Left;    Home Medications:  Allergies as of 04/20/2018      Reactions   Mangifera Indica Anaphylaxis   Other  Anaphylaxis, Itching, Other (See Comments)   Mango skin   Codeine Nausea And Vomiting      Medication List        Accurate as of 04/20/18  3:54 PM. Always use your most recent med list.          acetaminophen 325 MG tablet Commonly known as:  TYLENOL Take 650 mg by mouth every 6 (six) hours as needed.   albuterol 108 (90 Base) MCG/ACT inhaler Commonly known as:  PROVENTIL HFA;VENTOLIN HFA Inhale 2 puffs into the lungs every 6 (six) hours as needed for wheezing or shortness of breath.   budesonide 0.5 MG/2ML nebulizer solution Commonly known as:  PULMICORT Inhale 2 mLs into the lungs daily as needed.   busPIRone 10 MG tablet Commonly known as:  BUSPAR Take 10 mg by mouth 3 (three) times daily.   clopidogrel 75 MG tablet Commonly known as:  PLAVIX Take 75 mg by mouth daily.   divalproex 500 MG DR tablet Commonly known as:  DEPAKOTE Take 500 mg by mouth 2 (two) times daily.   gabapentin 300 MG capsule Commonly known as:  NEURONTIN Take 600 mg by mouth 2 (two) times daily.   ipratropium-albuterol 0.5-2.5 (3) MG/3ML Soln Commonly known as:  DUONEB Take 3 mLs by nebulization every 6 (six) hours as needed (wheezing/shortness of breath).   isosorbide mononitrate 30 MG 24 hr tablet Commonly known as:  IMDUR Take 30 mg by mouth daily.   losartan 25 MG tablet Commonly known as:  COZAAR Take 1 tablet (25 mg total) by mouth daily.   Melatonin 5 MG Tabs Take 5 mg by mouth at bedtime.   metoprolol succinate 25 MG 24 hr tablet Commonly known as:  TOPROL-XL Take 1 tablet (25 mg total) by mouth daily.   nitroGLYCERIN 0.4 MG SL tablet Commonly known as:  NITROSTAT Place 0.4 mg under the tongue every 5 (five) minutes as needed for chest pain.   omeprazole 40 MG capsule Commonly known as:  PRILOSEC Take 40 mg by mouth daily.   rosuvastatin 20 MG tablet Commonly known as:  CRESTOR Take 20 mg by mouth daily.   senna 8.6 MG tablet Commonly known as:  SENOKOT Take 2  tablets by mouth as needed.   spironolactone 25 MG tablet Commonly known as:  ALDACTONE Take 0.5 tablets (12.5 mg total) by mouth daily.   T.E.D. BELOW KNEE/X-LARGE Misc 1 application by Does not apply route daily.   tamsulosin 0.4 MG Caps capsule Commonly known  as:  FLOMAX TAKE ONE CAPSULE BY MOUTH EVERY DAY AFTER SUPPER   torsemide 20 MG tablet Commonly known as:  DEMADEX 2 tablets (40 mg) in the AM and 1 tablet (20 mg) at lunch.   traZODone 50 MG tablet Commonly known as:  DESYREL Take 100 mg by mouth at bedtime.   TRINTELLIX 5 MG Tabs tablet Generic drug:  vortioxetine HBr Take 5 mg by mouth daily.   XARELTO 20 MG Tabs tablet Generic drug:  rivaroxaban Take 20 mg by mouth at bedtime.       Allergies:  Allergies  Allergen Reactions  . Mangifera Indica Anaphylaxis  . Other Anaphylaxis, Itching and Other (See Comments)    Mango skin  . Codeine Nausea And Vomiting    Family History: Family History  Problem Relation Age of Onset  . Other Father        Cerebral hemorrhage  . Kidney cancer Neg Hx   . Kidney disease Neg Hx   . Prostate cancer Neg Hx     Social History:  reports that he quit smoking about 2 years ago. His smoking use included cigarettes. He has a 40.00 pack-year smoking history. He has never used smokeless tobacco. He reports that he drank alcohol. He reports that he does not use drugs.  ROS: UROLOGY Frequent Urination?: Yes Hard to postpone urination?: Yes Burning/pain with urination?: No Get up at night to urinate?: Yes Leakage of urine?: Yes Urine stream starts and stops?: Yes Trouble starting stream?: No Do you have to strain to urinate?: No Blood in urine?: No Urinary tract infection?: No Sexually transmitted disease?: No Injury to kidneys or bladder?: No Painful intercourse?: No Weak stream?: No Erection problems?: Yes Penile pain?: No  Gastrointestinal Nausea?: No Vomiting?: No Indigestion/heartburn?: No Diarrhea?:  No Constipation?: Yes  Constitutional Fever: No Night sweats?: No Weight loss?: No Fatigue?: No  Skin Skin rash/lesions?: No Itching?: Yes  Eyes Blurred vision?: No Double vision?: No  Ears/Nose/Throat Sore throat?: No Sinus problems?: Yes  Hematologic/Lymphatic Swollen glands?: No Easy bruising?: Yes  Cardiovascular Leg swelling?: Yes Chest pain?: No  Respiratory Cough?: No Shortness of breath?: Yes  Endocrine Excessive thirst?: Yes  Musculoskeletal Back pain?: Yes Joint pain?: Yes  Neurological Headaches?: No Dizziness?: Yes  Psychologic Depression?: Yes Anxiety?: Yes  Physical Exam: BP 125/71 (BP Location: Left Arm, Patient Position: Sitting, Cuff Size: Large)   Pulse 70   Ht 5' 7.5" (1.715 m)   Wt 268 lb 8 oz (121.8 kg)   BMI 41.43 kg/m   Constitutional:  Alert and oriented, No acute distress. HEENT: [place] AT, moist mucus membranes.  Trachea midline, no masses. Cardiovascular: No clubbing, cyanosis, or edema. Respiratory: Normal respiratory effort, no increased work of breathing. Skin: No rashes, bruises or suspicious lesions. Neurologic: Grossly intact, no focal deficits, moving all 4 extremities. Psychiatric: Normal mood and affect.  Laboratory Data: Lab Results  Component Value Date   WBC 9.0 11/17/2017   HGB 13.0 11/17/2017   HCT 37.6 (L) 11/17/2017   MCV 88.5 11/17/2017   PLT 225 11/17/2017    Lab Results  Component Value Date   CREATININE 1.29 (H) 11/17/2017    Urinalysis    Component Value Date/Time   COLORURINE STRAW (A) 03/05/2017 2222   APPEARANCEUR Clear 07/20/2017 1120   LABSPEC 1.009 03/05/2017 2222   PHURINE 6.0 03/05/2017 2222   GLUCOSEU Negative 07/20/2017 1120   HGBUR NEGATIVE 03/05/2017 2222   BILIRUBINUR Negative 07/20/2017 [place] 03/05/2017 2222  PROTEINUR Negative 07/20/2017 [place] 03/05/2017 2222   NITRITE Negative 07/20/2017 1120   NITRITE NEGATIVE 03/05/2017 2222    LEUKOCYTESUR Negative 07/20/2017 1120    Lab Results  Component Value Date   LABMICR See below: 10/26/2016   WBCUA >30 (H) 10/26/2016   RBCUA None seen 10/26/2016   LABEPIT None seen 10/26/2016   MUCUS Present (A) 10/26/2016   BACTERIA NONE SEEN 03/05/2017    Pertinent Imaging: Results for orders placed or performed in visit on 04/20/18  Bladder Scan (Post Void Residual) in office  Result Value Ref Range   Scan Result 189     Assessment & Plan:    1. Bladder neck contracture Continue to self cath several times a week to keep bladder neck open Alternative options down the road would include laser incision of the bladder neck, however, given his severe medical comorbidities, would like to avoid anesthesia if possible He is agreeable this plan and understands the importance of compliance especially after recent need for dilation in the office which was uncomfortable for him Continue Flomax Plan to defer PSA screening given his multiple medical comorbidities - Bladder Scan (Post Void Residual) in office  2. Incomplete bladder emptying Bladder scan 189, voided about 1 hour ago Patient advised he should self cath more often if he feels like he is emptying his bladder completely   Return in about 1 year (around 04/21/2019) for IPSS/ PVR.  Hollice Espy, MD  [facility] [address] [place], [place] [address] [phone]

## 2018-04-20 NOTE — Telephone Encounter (Signed)
Clearance routed to fax number listed via Epic.

## 2018-05-24 ENCOUNTER — Ambulatory Visit (INDEPENDENT_AMBULATORY_CARE_PROVIDER_SITE_OTHER): Payer: Medicare Other

## 2018-05-24 DIAGNOSIS — I255 Ischemic cardiomyopathy: Secondary | ICD-10-CM | POA: Diagnosis not present

## 2018-05-25 NOTE — Progress Notes (Signed)
Remote ICD transmission.   

## 2018-06-21 ENCOUNTER — Ambulatory Visit: Payer: Medicare Other | Attending: Family | Admitting: Family

## 2018-06-21 ENCOUNTER — Encounter: Payer: Self-pay | Admitting: Family

## 2018-06-21 VITALS — BP 136/80 | HR 72 | Resp 18 | Ht 67.0 in | Wt 265.4 lb

## 2018-06-21 DIAGNOSIS — J432 Centrilobular emphysema: Secondary | ICD-10-CM

## 2018-06-21 DIAGNOSIS — Z79899 Other long term (current) drug therapy: Secondary | ICD-10-CM | POA: Insufficient documentation

## 2018-06-21 DIAGNOSIS — J449 Chronic obstructive pulmonary disease, unspecified: Secondary | ICD-10-CM | POA: Insufficient documentation

## 2018-06-21 DIAGNOSIS — Z7901 Long term (current) use of anticoagulants: Secondary | ICD-10-CM | POA: Insufficient documentation

## 2018-06-21 DIAGNOSIS — I110 Hypertensive heart disease with heart failure: Secondary | ICD-10-CM | POA: Diagnosis not present

## 2018-06-21 DIAGNOSIS — F32A Depression, unspecified: Secondary | ICD-10-CM

## 2018-06-21 DIAGNOSIS — I959 Hypotension, unspecified: Secondary | ICD-10-CM | POA: Diagnosis not present

## 2018-06-21 DIAGNOSIS — Z955 Presence of coronary angioplasty implant and graft: Secondary | ICD-10-CM | POA: Insufficient documentation

## 2018-06-21 DIAGNOSIS — F329 Major depressive disorder, single episode, unspecified: Secondary | ICD-10-CM

## 2018-06-21 DIAGNOSIS — F419 Anxiety disorder, unspecified: Secondary | ICD-10-CM | POA: Diagnosis not present

## 2018-06-21 DIAGNOSIS — I252 Old myocardial infarction: Secondary | ICD-10-CM | POA: Diagnosis not present

## 2018-06-21 DIAGNOSIS — Z885 Allergy status to narcotic agent status: Secondary | ICD-10-CM | POA: Insufficient documentation

## 2018-06-21 DIAGNOSIS — I5022 Chronic systolic (congestive) heart failure: Secondary | ICD-10-CM | POA: Diagnosis not present

## 2018-06-21 DIAGNOSIS — I950 Idiopathic hypotension: Secondary | ICD-10-CM

## 2018-06-21 DIAGNOSIS — Z87891 Personal history of nicotine dependence: Secondary | ICD-10-CM | POA: Diagnosis not present

## 2018-06-21 DIAGNOSIS — R0602 Shortness of breath: Secondary | ICD-10-CM | POA: Insufficient documentation

## 2018-06-21 DIAGNOSIS — I890 Lymphedema, not elsewhere classified: Secondary | ICD-10-CM

## 2018-06-21 DIAGNOSIS — Z87820 Personal history of traumatic brain injury: Secondary | ICD-10-CM | POA: Insufficient documentation

## 2018-06-21 NOTE — Progress Notes (Signed)
Patient ID: Kevin Pearson, male    DOB: 1951/06/19, 67 y.o.   MRN: [id number]  HPI  Kevin Pearson is a 67 y/o male with a history of GERD, anxiety, atrial fibrillation, depression, COPD (chronic O2), TBI, MI (multiple stents), hepatitis, hyperlipidemia, HTN, obstructive sleep apnea (without CPAP), remote tobacco/drug/alcohol use and chronic heart failure.   Echo report from 10/21/17 reviewed and showed an EF of 45-50% along with mild Kevin and an elevated PA pressure of 34 mm Hg. Reviewed echo report on 02/14/16 which showed an EF of 35%.   Cardiac catheterization done 10/27/17 and showed:  Mid Cx lesion is 100% stenosed.  Prox LAD lesion is 30% stenosed.  Prox RCA lesion is 10% stenosed.  Mid RCA lesion is 40% stenosed.  Mid RCA to Dist RCA lesion is 40% stenosed.  The left ventricular ejection fraction is 35-45% by visual estimate.  There is mild to moderate left ventricular systolic dysfunction.  There is trivial (1+) mitral regurgitation.  Prox Cx to Mid Cx lesion is 40% stenosed.  Was in the ED 02/08/18 due to foreign body in the ear after using a Qtip. Cotton was removed with forceps and he was released.   He presents today for a follow-up visit with a chief complaint of minimal shortness of breath upon moderate exertion. He describes this as chronic in nature having been present for several years. He has associated fatigue, pedal edema, light-headedness, anxiety and gradual weight gain. He denies any difficulty sleeping, abdominal distention, palpitations, chest pain or cough. He would like to see a different pulmonologist as he continues to feel short of breath even though his oxygen levels stay in the 90's. Does wear oxygen as needed during the day as well as at bedtime.   Past Medical History:  Diagnosis Date  . Acid reflux   . Anxiety   . Arthritis   . Atrial fibrillation (HCC)   . CHF (congestive heart failure) (HCC)   . Chronic orthostatic hypotension   . Clotting disorder  (HCC)   . COPD (chronic obstructive pulmonary disease) (HCC)   . Depression   . Elevated PSA   . Heart attack (HCC)   . Heart disease   . Heart failure (HCC)   . Hepatitis   . High cholesterol   . Hypertension   . Sleep apnea   . TBI (traumatic brain injury) (HCC)   . Urinary retention    Past Surgical History:  Procedure Laterality Date  . BLADDER SURGERY    . cardiac stents    . CYSTOSCOPY WITH URETHRAL DILATATION N/A 06/25/2016   Procedure: CYSTOSCOPY WITH URETHRAL DILATATION;  Surgeon: Vanna Scotland, MD;  Location: [facility] ORS;  Service: Urology;  Laterality: N/A;  . EP IMPLANTABLE DEVICE     St. Jude BiV-ICD  . GREEN LIGHT LASER TURP (TRANSURETHRAL RESECTION OF PROSTATE  2016   done in [place]   . LEFT HEART CATH AND CORONARY ANGIOGRAPHY Left 10/27/2017   Procedure: LEFT HEART CATH AND CORONARY ANGIOGRAPHY;  Surgeon: Antonieta Iba, MD;  Location: [facility] INVASIVE CV LAB;  Service: Cardiovascular;  Laterality: Left;   Family History  Problem Relation Age of Onset  . Other Father        Cerebral hemorrhage  . Kidney cancer Neg Hx   . Kidney disease Neg Hx   . Prostate cancer Neg Hx    Social History   Tobacco Use  . Smoking status: Former Smoker    Packs/day: 1.00    Years: 40.00  Pack years: 40.00    Types: Cigarettes    Last attempt to quit: 08/2015    Years since quitting: 2.8  . Smokeless tobacco: Never Used  Substance Use Topics  . Alcohol use: Not Currently   Allergies  Allergen Reactions  . Mangifera Indica Anaphylaxis  . Other Anaphylaxis, Itching and Other (See Comments)    Mango skin  . Codeine Nausea And Vomiting   Prior to Admission medications   Medication Sig Start Date End Date Taking? Authorizing Provider  acetaminophen (TYLENOL) 325 MG tablet Take 650 mg by mouth every 6 (six) hours as needed.    Yes [provider]  albuterol (PROVENTIL HFA;VENTOLIN HFA) 108 (90 Base) MCG/ACT inhaler Inhale 2 puffs into the lungs every 6 (six) hours  as needed for wheezing or shortness of breath. 11/17/17  Yes Willy Eddy, MD  budesonide (PULMICORT) 0.5 MG/2ML nebulizer solution Inhale 2 mLs into the lungs daily as needed.  09/26/16  Yes [provider]  busPIRone (BUSPAR) 10 MG tablet Take 10 mg by mouth 3 (three) times daily.   Yes [provider]  clopidogrel (PLAVIX) 75 MG tablet Take 75 mg by mouth daily.   Yes [provider]  divalproex (DEPAKOTE) 500 MG DR tablet Take 500 mg by mouth 2 (two) times daily.  07/30/17  Yes [provider]  Elastic Bandages & Supports (T.E.D. BELOW KNEE/X-LARGE) MISC 1 application by Does not apply route daily. 11/17/17  Yes Willy Eddy, MD  gabapentin (NEURONTIN) 300 MG capsule Take 600 mg by mouth 2 (two) times daily.    Yes [provider]  ipratropium-albuterol (DUONEB) 0.5-2.5 (3) MG/3ML SOLN Take 3 mLs by nebulization every 6 (six) hours as needed (wheezing/shortness of breath).   Yes [provider]  isosorbide mononitrate (IMDUR) 30 MG 24 hr tablet Take 30 mg by mouth daily.   Yes [provider]  losartan (COZAAR) 25 MG tablet Take 1 tablet (25 mg total) by mouth daily. 11/04/17  Yes Gollan, Tollie Pizza, MD  Melatonin 5 MG TABS Take 5 mg by mouth at bedtime.   Yes [provider]  metoprolol succinate (TOPROL XL) 25 MG 24 hr tablet Take 1 tablet (25 mg total) by mouth daily. 12/21/17  Yes Gollan, Tollie Pizza, MD  nitroGLYCERIN (NITROSTAT) 0.4 MG SL tablet Place 0.4 mg under the tongue every 5 (five) minutes as needed for chest pain.   Yes [provider]  omeprazole (PRILOSEC) 40 MG capsule Take 40 mg by mouth daily.   Yes [provider]  rivaroxaban (XARELTO) 20 MG TABS tablet Take 20 mg by mouth at bedtime.   Yes [provider]  rosuvastatin (CRESTOR) 20 MG tablet Take 20 mg by mouth daily.   Yes [provider]  senna (SENOKOT) 8.6 MG tablet Take 2 tablets by mouth as needed.    Yes  [provider]  spironolactone (ALDACTONE) 25 MG tablet Take 0.5 tablets (12.5 mg total) by mouth daily. 11/23/17  Yes Duke Salvia, MD  tamsulosin (FLOMAX) 0.4 MG CAPS capsule TAKE ONE CAPSULE BY MOUTH EVERY DAY AFTER SUPPER 03/23/17  Yes McGowan, Carollee Herter A, PA-C  torsemide (DEMADEX) 20 MG tablet 2 tablets (40 mg) in the AM and 1 tablet (20 mg) at lunch. 12/21/17  Yes Antonieta Iba, MD  traZODone (DESYREL) 50 MG tablet Take 100 mg by mouth at bedtime.   Yes [provider]  vortioxetine HBr (TRINTELLIX) 5 MG TABS Take 5 mg by mouth daily.  Yes [provider]    Review of Systems  Constitutional: Positive for fatigue. Negative for appetite change.  HENT: Negative for congestion, postnasal drip and sore throat.   Eyes: Negative.   Respiratory: Positive for shortness of breath. Negative for cough and chest tightness.   Cardiovascular: Positive for leg swelling (better when wearing TED hose). Negative for chest pain and palpitations.  Gastrointestinal: Negative for abdominal distention and abdominal pain.  Endocrine: Negative.   Genitourinary: Negative.   Musculoskeletal: Positive for arthralgias (left shoulder pain). Negative for back pain.  Skin: Negative.   Allergic/Immunologic: Negative.   Neurological: Positive for light-headedness (when changing positions too quickly) and numbness (bilateral feet). Negative for dizziness and tremors.       Memory issues   Hematological: Negative for adenopathy. Does not bruise/bleed easily.  Psychiatric/Behavioral: Positive for dysphoric mood. Negative for sleep disturbance (sleeping on 2 pillows; wearing oxygen at 2L at bedtime and PRN during the day) and suicidal ideas. The patient is nervous/anxious.    Vitals:   06/21/18 1105  BP: (!) 144/105  Pulse: 72  Resp: 18  SpO2: 100%  Weight: 265 lb 6 oz (120.4 kg)  Height: 5\' 7"  (1.702 m)   Wt Readings from Last 3 Encounters:  06/21/18 265 lb 6 oz (120.4 kg)   04/20/18 268 lb 8 oz (121.8 kg)  03/15/18 264 lb (119.7 kg)   Lab Results  Component Value Date   CREATININE 1.29 (H) 11/17/2017   CREATININE 1.19 10/21/2017   CREATININE 1.73 (H) 09/23/2017    Physical Exam  Constitutional: He is oriented to person, place, and time. He appears well-developed and well-nourished.  HENT:  Head: Normocephalic and atraumatic.  Neck: Normal range of motion. Neck supple. No JVD present.  Cardiovascular: Normal rate. An irregular rhythm present.  Pulmonary/Chest: Effort normal. He has no wheezes. He has no rales.  Abdominal: Soft. He exhibits no distension. There is no abdominal tenderness.  Musculoskeletal:        General: Edema (1+ pitting edema in bilateral lower legs ) present. No tenderness.  Neurological: He is alert and oriented to person, place, and time.  Skin: Skin is warm and dry.  Psychiatric: He has a normal mood and affect. His behavior is normal.  Nursing note and vitals reviewed.  Assessment & Plan:  1: Chronic heart failure with reduced ejection fraction- - NYHA class II - euvolemic today - weighing daily. Reminded to weigh every morning, write the weight down and call for an overnight weight gain of >2 pounds or a weekly weight gain of >5 pounds.  - weight up 6 pounds from last visit 6 months ago; [facility] - going to join [employer] in January and has been attending [employer] a few times/ week - not adding salt to his food although he says that he tends to eat salty foods because of convenience. Reinforced the importance of closely following a 2000mg  sodium diet.  - wearing compression socks daily with removal at bedtime - saw cardiology Mariah Milling(Gollan) 12/21/17  2: Hypotension- - BP elevated initially (144/105) but had improved upon recheck (136/80) - Has had hypotension with previous entresto use - saw PCP ([place]) 04/21/18 - BMP from 11/17/17 reviewed and shows sodium 138, potassium 4.0 and  GFR 57  3: COPD- - wears oxygen at 2L at bedtime and during the day as needed  - saw pulmonologist Meredeth Ide([place]) 01/25/18 but would like a different pulmonologist - referral made to [facility] with Dr.  Simonds for tomorrow  4: Anxiety/depression- - continues to live on his own and is managing his medications  - follows with psychiatry (Su) on a monthly basis  5: Lymphedema- - stage 2 - reports good results when he wears his compression socks - no longer using the compression boots as he feels like he doesn't have enough time during the day to use them  Patient did not bring his medications nor a list. Each medication was verbally reviewed with the patient and he was encouraged to bring the bottles to every visit to confirm accuracy of list.  Return in 6 months or sooner for any questions/problems before then.

## 2018-06-21 NOTE — Patient Instructions (Signed)
Continue weighing daily and call for an overnight weight gain of > 2 pounds or a weekly weight gain of >5 pounds. 

## 2018-06-22 ENCOUNTER — Encounter: Payer: Self-pay | Admitting: Pulmonary Disease

## 2018-06-22 ENCOUNTER — Ambulatory Visit (INDEPENDENT_AMBULATORY_CARE_PROVIDER_SITE_OTHER): Payer: Medicare Other | Admitting: Pulmonary Disease

## 2018-06-22 VITALS — BP 142/84 | HR 76 | Resp 16 | Ht 67.0 in | Wt 265.0 lb

## 2018-06-22 DIAGNOSIS — R0600 Dyspnea, unspecified: Secondary | ICD-10-CM

## 2018-06-22 DIAGNOSIS — J449 Chronic obstructive pulmonary disease, unspecified: Secondary | ICD-10-CM

## 2018-06-22 DIAGNOSIS — Z87891 Personal history of nicotine dependence: Secondary | ICD-10-CM

## 2018-06-22 DIAGNOSIS — R0609 Other forms of dyspnea: Secondary | ICD-10-CM

## 2018-06-22 DIAGNOSIS — G4733 Obstructive sleep apnea (adult) (pediatric): Secondary | ICD-10-CM

## 2018-06-22 DIAGNOSIS — I255 Ischemic cardiomyopathy: Secondary | ICD-10-CM | POA: Diagnosis not present

## 2018-06-22 DIAGNOSIS — E668 Other obesity: Secondary | ICD-10-CM

## 2018-06-22 DIAGNOSIS — I482 Chronic atrial fibrillation, unspecified: Secondary | ICD-10-CM

## 2018-06-22 MED ORDER — UMECLIDINIUM-VILANTEROL 62.5-25 MCG/INH IN AEPB: 1.0000 | INHALATION_SPRAY | Freq: Every day | RESPIRATORY_TRACT | 5 refills | Status: DC

## 2018-06-22 MED ORDER — UMECLIDINIUM-VILANTEROL 62.5-25 MCG/INH IN AEPB: 1.0000 | INHALATION_SPRAY | Freq: Every day | RESPIRATORY_TRACT | 0 refills | Status: DC

## 2018-06-22 NOTE — Progress Notes (Signed)
PULMONARY CONSULT NOTE  Requesting MD/Service: Heart failure clinic Date of initial consultation: 06/22/18 Reason for consultation:  COPD  PT PROFILE: 67 y.o. male former smoker (40+ pack year history, quit 2017) with mild COPD, ischemic cardiomyopathy (LVEF 45-50%), chronic atrial fibrillation, mild PAH (RVSP est 34 mmHg by echocardiogram).  Previously followed by Dr. Meredeth Pearson  DATA: 12/23/16 CT chest: Linear pleural-parenchymal thickening in the LEFT lower lobe is stable. Upper lobe ill-defined centrilobular nodules are suggestive of respiratory bronchiolitis interstitial lung disease. No mediastinal LAN.  10/21/17 Echocardiogram: LVEF 45-50%.  Hypokinesis of inferior and lateral myocardium.  Mild MR.  LA severely dilated.  RVSP estimate 34 mmHg 11/22/17 Spirometry [facility]): FVC: 3.62 L (105 %pred), FEV1: 2.35 L (85 %pred), FEV1/FVC: 65%  INTERVAL:  HPI:  He has been seen previously by Dr. Meredeth Pearson.  However, there were some difficulties paying his bills and therefore he has requested a change to me.  He is at his baseline.  He describes moderate exertional dyspnea of approximately 10 years duration.  He dates the onset of his symptoms to his first coronary event.  He has little day-to-day variation.  He is maintained on nebulized steroids and bronchodilators.  He uses budesonide 0.5 mg nebulized once daily.  He also uses DuoNeb once daily.  He carries an albuterol rescue inhaler which he uses 5-6 times per day.  He believes that he gets modest improvement in exertional dyspnea with albuterol.  He denies exertional chest pain.  He does have orthopnea, occasional paroxysmal nocturnal dyspnea and chronic lower extremity edema.  He denies significant cough other than scant mucus in the mornings.  He has no hemoptysis.  He is a former smoker with approximately 50-pack-year history.  He quit 3 years ago.  He has no significant occupational exposures.  He was not in the [employer].  He has a dog in the home.   He has no unusual hobbies.  He has a history of obstructive sleep apnea.  He has been intolerant to CPAP.  He wears oxygen with sleep.    Past Medical History:  Diagnosis Date  . Acid reflux   . Anxiety   . Arthritis   . Atrial fibrillation (HCC)   . CHF (congestive heart failure) (HCC)   . Chronic orthostatic hypotension   . Clotting disorder (HCC)   . COPD (chronic obstructive pulmonary disease) (HCC)   . Depression   . Elevated PSA   . Heart attack (HCC)   . Heart disease   . Heart failure (HCC)   . Hepatitis   . High cholesterol   . Hypertension   . Sleep apnea   . TBI (traumatic brain injury) (HCC)   . Urinary retention     Past Surgical History:  Procedure Laterality Date  . BLADDER SURGERY    . cardiac stents    . CYSTOSCOPY WITH URETHRAL DILATATION N/A 06/25/2016   Procedure: CYSTOSCOPY WITH URETHRAL DILATATION;  Surgeon: Vanna Scotland, MD;  Location: [facility] ORS;  Service: Urology;  Laterality: N/A;  . EP IMPLANTABLE DEVICE     St. Jude BiV-ICD  . GREEN LIGHT LASER TURP (TRANSURETHRAL RESECTION OF PROSTATE  2016   done in [place]   . LEFT HEART CATH AND CORONARY ANGIOGRAPHY Left 10/27/2017   Procedure: LEFT HEART CATH AND CORONARY ANGIOGRAPHY;  Surgeon: Antonieta Iba, MD;  Location: [facility] INVASIVE CV LAB;  Service: Cardiovascular;  Laterality: Left;    MEDICATIONS: I have reviewed all medications and confirmed regimen as documented  Social History  Socioeconomic History  . Marital status: Divorced    Spouse name: Not on file  . Number of children: Not on file  . Years of education: Not on file  . Highest education level: Not on file  Occupational History  . Not on file  Social Needs  . Financial resource strain: Not on file  . Food insecurity:    Worry: Not on file    Inability: Not on file  . Transportation needs:    Medical: Not on file    Non-medical: Not on file  Tobacco Use  . Smoking status: Former Smoker    Packs/day: 1.00    Years: 40.00     Pack years: 40.00    Types: Cigarettes    Last attempt to quit: 08/2015    Years since quitting: 2.9  . Smokeless tobacco: Never Used  Substance and Sexual Activity  . Alcohol use: Not Currently  . Drug use: No  . Sexual activity: Never  Lifestyle  . Physical activity:    Days per week: Not on file    Minutes per session: Not on file  . Stress: Not on file  Relationships  . Social connections:    Talks on phone: Not on file    Gets together: Not on file    Attends religious service: Not on file    Active member of club or organization: Not on file    Attends meetings of clubs or organizations: Not on file    Relationship status: Not on file  . Intimate partner violence:    Fear of current or ex partner: Not on file    Emotionally abused: Not on file    Physically abused: Not on file    Forced sexual activity: Not on file  Other Topics Concern  . Not on file  Social History Narrative  . Not on file    Family History  Problem Relation Age of Onset  . Other Father        Cerebral hemorrhage  . Kidney cancer Neg Hx   . Kidney disease Neg Hx   . Prostate cancer Neg Hx     ROS: No fever, myalgias/arthralgias, unexplained weight loss or weight gain No new focal weakness or sensory deficits No otalgia, hearing loss, visual changes, nasal and sinus symptoms, mouth and throat problems No neck pain or adenopathy No abdominal pain, N/V/D, diarrhea, change in bowel pattern No dysuria, change in urinary pattern   Vitals:   06/22/18 1125 06/22/18 1131  BP:  (!) 142/84  Pulse:  76  Resp: 16   SpO2:  100%  Weight: 265 lb (120.2 kg)   Height: 5\' 7"  (1.702 m)   Room air  EXAM:  Gen: Moderately obese, No overt respiratory distress HEENT: NCAT, sclera white, oropharynx normal Neck: Supple without LAN, thyromegaly, JVD Lungs: breath sounds full without wheezes or other adventitious sounds.  Percussion note is normal throughout. Cardiovascular: RRR, no murmurs  noted Abdomen: Soft, nontender, normal BS Ext: without clubbing, cyanosis, ede Neuro: CNs grossly intact, motor and sensory intact Skin: Limited exam, no lesions noted  DATA:   BMP Latest Ref Rng & Units 11/17/2017 10/21/2017 09/23/2017  Glucose 65 - 99 mg/dL 098(J) 191(Y) 782(N)  BUN 6 - 20 mg/dL 18 19 56(O)  Creatinine 0.61 - 1.24 mg/dL 1.30(Q) 6.57 8.46(N)  BUN/Creat Ratio 10 - 24 - 16 -  Sodium 135 - 145 mmol/L 138 141 140  Potassium 3.5 - 5.1 mmol/L 4.0 4.9 3.8  Chloride  101 - 111 mmol/L 105 102 100(L)  CO2 22 - 32 mmol/L 21(L) 23 27  Calcium 8.9 - 10.3 mg/dL 9.5 40.910.0 9.8    CBC Latest Ref Rng & Units 11/17/2017 10/21/2017 03/22/2017  WBC 3.8 - 10.6 K/uL 9.0 6.9 7.1  Hemoglobin 13.0 - 18.0 g/dL 81.113.0 91.413.1 12.9(L)  Hematocrit 40.0 - 52.0 % 37.6(L) 39.2 38.5(L)  Platelets 150 - 440 K/uL 225 270 183    CXR 11/17/2017: AICD present.  No acute findings  I have personally reviewed all chest radiographs reported above including CXRs and CT chest unless otherwise indicated  IMPRESSION:     ICD-10-CM   1. COPD, mild (HCC) J44.9 Pulmonary Function Test [facility] Only  2. Severe exertional dyspnea, multifactorial R06.09   3. Chronic atrial fibrillation I48.20   4. Ischemic cardiomyopathy I25.5   5. Moderate obesity E66.8   6. Obstructive sleep apnea G47.33   7. Former smoker Z87.891   8.      CPAP intolerance   His severity of exertional dyspnea is not explained by mild obstruction on PFTs.  Exertional dyspnea is likely multifactorial and attributable to obesity, deconditioning, ischemic cardiomyopathy, chronic atrial fibrillation and lastly COPD  PLAN:  For now, I have instructed him to discontinue scheduled nebulized budesonide and DuoNeb  Trial of Anoro inhaler, 1 inhalation daily in the morning  Continue albuterol inhaler as needed for wheezing, chest tightness, cough, increased shortness of breath  Continue oxygen therapy with sleep  Follow-up in 6 weeks with PFTs prior to  that visit   Kevin Fischeravid Nalia Honeycutt, MD PCCM service Mobile [phone] Pager [phone] 07/03/2018 12:58 PM

## 2018-06-22 NOTE — Patient Instructions (Signed)
For now, stop nebulizer medications Trial of Anoro inhaler, 1 inhalation daily in the morning Continue albuterol inhaler as needed for increased wheezing, chest tightness, cough, shortness of breath Continue oxygen therapy with sleep Follow-up in 6 weeks with PFTs (lung function test) prior to that visit

## 2018-07-03 ENCOUNTER — Encounter: Payer: Self-pay | Admitting: Pulmonary Disease

## 2018-07-20 LAB — CUP PACEART REMOTE DEVICE CHECK
Battery Remaining Longevity: 34 mo
Battery Remaining Percentage: 37 %
Battery Voltage: 2.92 V
Date Time Interrogation Session: 20191119115851
HighPow Impedance: 71 Ohm
HighPow Impedance: 71 Ohm
Implantable Lead Implant Date: 20131003
Implantable Lead Implant Date: 20131003
Implantable Lead Location: 753858
Implantable Lead Location: 753860
Implantable Pulse Generator Implant Date: 20131003
Lead Channel Impedance Value: 1025 Ohm
Lead Channel Impedance Value: 540 Ohm
Lead Channel Pacing Threshold Amplitude: 0.625 V
Lead Channel Pacing Threshold Amplitude: 1.25 V
Lead Channel Pacing Threshold Pulse Width: 0.5 ms
Lead Channel Pacing Threshold Pulse Width: 0.6 ms
Lead Channel Sensing Intrinsic Amplitude: 12 mV
Lead Channel Setting Pacing Amplitude: 2 V
Lead Channel Setting Pacing Amplitude: 2.5 V
Lead Channel Setting Pacing Pulse Width: 0.5 ms
Lead Channel Setting Pacing Pulse Width: 0.6 ms
Lead Channel Setting Sensing Sensitivity: 0.5 mV
Pulse Gen Serial Number: 7055215

## 2018-07-28 ENCOUNTER — Ambulatory Visit: Payer: Medicare Other | Attending: Pulmonary Disease

## 2018-07-28 DIAGNOSIS — Z87891 Personal history of nicotine dependence: Secondary | ICD-10-CM | POA: Insufficient documentation

## 2018-07-28 DIAGNOSIS — J449 Chronic obstructive pulmonary disease, unspecified: Secondary | ICD-10-CM | POA: Diagnosis not present

## 2018-07-28 MED ORDER — ALBUTEROL SULFATE (2.5 MG/3ML) 0.083% IN NEBU
2.5000 mg | INHALATION_SOLUTION | Freq: Once | RESPIRATORY_TRACT | Status: AC
  Administered 2018-07-28: 2.5 mg via RESPIRATORY_TRACT
  Filled 2018-07-28: qty 3

## 2018-08-04 ENCOUNTER — Encounter: Payer: Self-pay | Admitting: Pulmonary Disease

## 2018-08-04 ENCOUNTER — Ambulatory Visit (INDEPENDENT_AMBULATORY_CARE_PROVIDER_SITE_OTHER): Payer: Medicare Other | Admitting: Pulmonary Disease

## 2018-08-04 VITALS — BP 130/82 | HR 70 | Ht 67.0 in | Wt 264.8 lb

## 2018-08-04 DIAGNOSIS — R0609 Other forms of dyspnea: Secondary | ICD-10-CM | POA: Diagnosis not present

## 2018-08-04 DIAGNOSIS — Z87891 Personal history of nicotine dependence: Secondary | ICD-10-CM

## 2018-08-04 DIAGNOSIS — J449 Chronic obstructive pulmonary disease, unspecified: Secondary | ICD-10-CM | POA: Diagnosis not present

## 2018-08-04 DIAGNOSIS — I482 Chronic atrial fibrillation, unspecified: Secondary | ICD-10-CM

## 2018-08-04 DIAGNOSIS — Z789 Other specified health status: Secondary | ICD-10-CM

## 2018-08-04 DIAGNOSIS — I255 Ischemic cardiomyopathy: Secondary | ICD-10-CM

## 2018-08-04 DIAGNOSIS — G4733 Obstructive sleep apnea (adult) (pediatric): Secondary | ICD-10-CM

## 2018-08-04 DIAGNOSIS — R0600 Dyspnea, unspecified: Secondary | ICD-10-CM

## 2018-08-04 DIAGNOSIS — E668 Other obesity: Secondary | ICD-10-CM

## 2018-08-04 NOTE — Patient Instructions (Addendum)
Cont Anoro inhaler daily as you controller medication Continue albuterol inhaler as first-line rescue medicine Continue nebulized DuoNeb as second line rescue medication Discontinue budesonide Enrollment in pulmonary rehab program Follow-up in 3 to 4 months.  Call sooner if needed

## 2018-08-04 NOTE — Progress Notes (Signed)
PULMONARY OFFICE FOLLOW-UP NOTE  Requesting MD/Service: Heart failure clinic Date of initial consultation: 06/22/18 Reason for consultation:  COPD  PT PROFILE: 68 y.o. male former smoker (40+ pack year history, quit 2017) with mild COPD, ischemic cardiomyopathy (LVEF 45-50%), chronic atrial fibrillation, mild PAH (RVSP est 34 mmHg by echocardiogram).  Previously followed by Dr. Meredeth Ide  DATA: 12/23/16 CT chest: Linear pleural-parenchymal thickening in the LEFT lower lobe is stable. Upper lobe ill-defined centrilobular nodules are suggestive of respiratory bronchiolitis interstitial lung disease. No mediastinal LAN.  10/21/17 Echocardiogram: LVEF 45-50%.  Hypokinesis of inferior and lateral myocardium.  Mild MR.  LA severely dilated.  RVSP estimate 34 mmHg 11/22/17 Spirometry [facility]): FVC: 3.62 L (105 %pred), FEV1: 2.35 L (85 %pred), FEV1/FVC: 65% 07/28/18: FVC: 3.81 > 3.86 L (96 > 97 %pred), FEV1: 2.63 > 2.70 L (86 > 88 %pred), FEV1/FVC: 69%, TLC: 5.56 L (86 %pred), DLCO 65 %pred.  Flow volume curve consistent with mild obstruction    INTERVAL: Last visit 06/22/18.  At that time, we changed scheduled nebulized budesonide and DuoNeb to a trial of Anoro.  SUBJ:  This is a scheduled follow-up to assess response to above medical changes and review pulmonary function tests.  Shortly after initiating Anoro inhaler, he became "sick" which he attributed to the Anoro.  He describes symptoms of a "bad cold".  He was treated with antibiotics by his primary care physician.  He resumed the Anoro inhaler 3 days prior to this encounter.  He notes mild improvement in exertional dyspnea.  He has no new complaints.  He denies chest pain and cough.  He has no hemoptysis or purulent sputum.  He denies lower extremity edema calf tenderness.  Overall, his symptoms are best in cool (but not too cold) weather.    Vitals:   08/04/18 1035 08/04/18 1040  BP:  130/82  Pulse:  70  SpO2:  98%  Weight: 264 lb 12.8 oz  (120.1 kg)   Height: 5\' 7"  (1.702 m)   RA  EXAM:  Gen: NAD HEENT: NCAT, sclera white Neck: No JVD Lungs: breath sounds full, no wheezes or other adventitious sounds Cardiovascular: RRR, no murmurs Abdomen: Soft, nontender, normal BS Ext: without clubbing, cyanosis, edema Neuro: grossly intact Skin: Limited exam, no lesions noted    DATA:   BMP Latest Ref Rng & Units 11/17/2017 10/21/2017 09/23/2017  Glucose 65 - 99 mg/dL 765(Y) 650(P) 546(F)  BUN 6 - 20 mg/dL 18 19 68(L)  Creatinine 0.61 - 1.24 mg/dL 2.75(T) 7.00 1.74(B)  BUN/Creat Ratio 10 - 24 - 16 -  Sodium 135 - 145 mmol/L 138 141 140  Potassium 3.5 - 5.1 mmol/L 4.0 4.9 3.8  Chloride 101 - 111 mmol/L 105 102 100(L)  CO2 22 - 32 mmol/L 21(L) 23 27  Calcium 8.9 - 10.3 mg/dL 9.5 44.9 9.8    CBC Latest Ref Rng & Units 11/17/2017 10/21/2017 03/22/2017  WBC 3.8 - 10.6 K/uL 9.0 6.9 7.1  Hemoglobin 13.0 - 18.0 g/dL 67.5 91.6 12.9(L)  Hematocrit 40.0 - 52.0 % 37.6(L) 39.2 38.5(L)  Platelets 150 - 440 K/uL 225 270 183   CXR: No new film  I have personally reviewed all chest radiographs reported above including CXRs and CT chest unless otherwise indicated  IMPRESSION:     ICD-10-CM   1. Severe exertional dyspnea, multifactorial R06.09   2. COPD, mild (HCC) J44.9   3. Chronic atrial fibrillation I48.20   4. Ischemic cardiomyopathy I25.5   5. Moderate obesity E66.8  6. Obstructive sleep apnea G47.33   7. Intolerance of continuous positive airway pressure (CPAP) ventilation Z78.9   8. Former smoker Z87.891    Exertional dyspnea is way out of proportion to PFT findings.  Therefore, mild COPD is likely not the major cause of his dyspnea and exertional intolerance.  This is most likely attributable to deconditioning, obesity, ischemic cardiomyopathy and chronic atrial fibrillation  PLAN:  Cont Anoro inhaler daily as you controller medication Continue albuterol inhaler as first-line rescue medicine Continue nebulized DuoNeb as  second line rescue medication Discontinue budesonide Referral for enrollment in pulmonary rehab program placed Follow-up in 3 to 4 months.  Call sooner if needed    Billy Fischer, MD PCCM service Mobile [phone] Pager [phone] 08/04/2018 10:44 AM

## 2018-08-23 ENCOUNTER — Ambulatory Visit (INDEPENDENT_AMBULATORY_CARE_PROVIDER_SITE_OTHER): Payer: Medicare Other

## 2018-08-23 DIAGNOSIS — I255 Ischemic cardiomyopathy: Secondary | ICD-10-CM | POA: Diagnosis not present

## 2018-08-23 LAB — CUP PACEART REMOTE DEVICE CHECK
Battery Remaining Longevity: 31 mo
Battery Remaining Percentage: 35 %
Battery Voltage: 2.9 V
Date Time Interrogation Session: 20200218090015
HighPow Impedance: 69 Ohm
HighPow Impedance: 69 Ohm
Implantable Lead Implant Date: 20131003
Implantable Lead Implant Date: 20131003
Implantable Lead Location: 753858
Implantable Lead Location: 753860
Implantable Pulse Generator Implant Date: 20131003
Lead Channel Impedance Value: 460 Ohm
Lead Channel Impedance Value: 990 Ohm
Lead Channel Pacing Threshold Amplitude: 0.5 V
Lead Channel Pacing Threshold Amplitude: 1.25 V
Lead Channel Pacing Threshold Pulse Width: 0.5 ms
Lead Channel Pacing Threshold Pulse Width: 0.6 ms
Lead Channel Sensing Intrinsic Amplitude: 9.2 mV
Lead Channel Setting Pacing Amplitude: 2 V
Lead Channel Setting Pacing Amplitude: 2.5 V
Lead Channel Setting Pacing Pulse Width: 0.5 ms
Lead Channel Setting Pacing Pulse Width: 0.6 ms
Lead Channel Setting Sensing Sensitivity: 0.5 mV
Pulse Gen Serial Number: 7055215

## 2018-08-24 ENCOUNTER — Other Ambulatory Visit: Payer: Self-pay | Admitting: *Deleted

## 2018-08-24 MED ORDER — ALBUTEROL SULFATE HFA 108 (90 BASE) MCG/ACT IN AERS: 2.0000 | INHALATION_SPRAY | Freq: Four times a day (QID) | RESPIRATORY_TRACT | 2 refills | Status: DC | PRN

## 2018-08-24 NOTE — Progress Notes (Signed)
Per paper refill request [employer] refilled.

## 2018-08-31 NOTE — Progress Notes (Signed)
Remote ICD transmission.   

## 2018-09-05 ENCOUNTER — Encounter: Payer: Self-pay | Admitting: Cardiology

## 2018-09-08 ENCOUNTER — Telehealth: Payer: Self-pay | Admitting: Pulmonary Disease

## 2018-09-08 DIAGNOSIS — J449 Chronic obstructive pulmonary disease, unspecified: Secondary | ICD-10-CM

## 2018-09-08 NOTE — Telephone Encounter (Signed)
OK with me  Dave 

## 2018-09-08 NOTE — Telephone Encounter (Signed)
ATC pt- no answer with no option to leave voicemail. Ling rang for >22min.  Will call back

## 2018-09-08 NOTE — Telephone Encounter (Signed)
Called and spoke to pt.  Pt states at his last OV with Dr. Sung Amabile, he was instructed to continue nighttime oxygen. Pt is requesting to switch to [employer] from [facility] in [place]. His current oxygen machine is showing an error message. Pt states he has reached out to [facility] regarding this without success.   Dr. Sung Amabile please advise if okay to order nighttime oxygen, as I do not see this mentioned in last OV note.

## 2018-09-14 NOTE — Telephone Encounter (Signed)
Pt is aware of below message and voiced his understanding. Order has been placed to [employer] for oxygen. Nothing further is needed.

## 2018-09-15 ENCOUNTER — Telehealth: Payer: Self-pay | Admitting: Pulmonary Disease

## 2018-09-15 DIAGNOSIS — J449 Chronic obstructive pulmonary disease, unspecified: Secondary | ICD-10-CM

## 2018-09-15 NOTE — Telephone Encounter (Signed)
Marylene Land with [employer] called and stated that in order for patient to change DME companies for 02 patient would need a current OV with Dr. Sung Amabile and an ONO ordered.   Please advise. Rhonda J Cobb

## 2018-09-16 NOTE — Telephone Encounter (Signed)
OK to order ONO  Thanks

## 2018-09-16 NOTE — Telephone Encounter (Signed)
Dr. Sung Amabile please advise if okay to order ONO, as this needed prior to pt switching DME companies. Will call pt after receiving response from DS.

## 2018-09-16 NOTE — Telephone Encounter (Signed)
Pt is aware of below message and voiced his understanding.  ONO has been ordered.  Pt states he will call back to schedule OV after ONO is preformed.  Pt is aware that OV needs to be within 30 days of ONO.  Nothing further is needed.

## 2018-10-17 ENCOUNTER — Other Ambulatory Visit: Payer: Self-pay

## 2018-10-17 MED ORDER — LOSARTAN POTASSIUM 25 MG PO TABS: 25.0000 mg | ORAL_TABLET | Freq: Every day | ORAL | 0 refills | Status: DC

## 2018-10-17 NOTE — Telephone Encounter (Signed)
Requested Prescriptions   Signed Prescriptions Disp Refills  . losartan (COZAAR) 25 MG tablet 90 tablet 0    Sig: Take 1 tablet (25 mg total) by mouth daily.    Authorizing Provider: Antonieta Iba    Ordering User: Margrett Rud

## 2018-10-27 ENCOUNTER — Ambulatory Visit: Payer: Self-pay | Admitting: Psychiatry

## 2018-11-15 ENCOUNTER — Other Ambulatory Visit: Payer: Self-pay

## 2018-11-15 MED ORDER — SPIRONOLACTONE 25 MG PO TABS: 12.5000 mg | ORAL_TABLET | Freq: Every day | ORAL | 3 refills | Status: DC

## 2018-11-22 ENCOUNTER — Ambulatory Visit (INDEPENDENT_AMBULATORY_CARE_PROVIDER_SITE_OTHER): Payer: Medicare Other | Admitting: *Deleted

## 2018-11-22 ENCOUNTER — Other Ambulatory Visit: Payer: Self-pay

## 2018-11-22 DIAGNOSIS — I255 Ischemic cardiomyopathy: Secondary | ICD-10-CM | POA: Diagnosis not present

## 2018-11-23 LAB — CUP PACEART REMOTE DEVICE CHECK
Date Time Interrogation Session: 20200520111344
Implantable Lead Implant Date: 20131003
Implantable Lead Implant Date: 20131003
Implantable Lead Location: 753858
Implantable Lead Location: 753860
Implantable Pulse Generator Implant Date: 20131003
Pulse Gen Serial Number: 7055215

## 2018-12-01 ENCOUNTER — Encounter: Payer: Self-pay | Admitting: Cardiology

## 2018-12-01 NOTE — Progress Notes (Signed)
Remote ICD transmission.   

## 2018-12-02 ENCOUNTER — Telehealth: Payer: Self-pay

## 2018-12-02 MED ORDER — ALBUTEROL SULFATE HFA 108 (90 BASE) MCG/ACT IN AERS: 2.0000 | INHALATION_SPRAY | Freq: Four times a day (QID) | RESPIRATORY_TRACT | 2 refills | Status: DC | PRN

## 2018-12-02 NOTE — Telephone Encounter (Signed)
Received faxed refill request from [facility] for Proiar HFA 108.  Refill sent. Nothing further needed at this time.

## 2018-12-08 ENCOUNTER — Ambulatory Visit: Payer: Self-pay | Admitting: Psychiatry

## 2018-12-15 ENCOUNTER — Telehealth: Payer: Self-pay | Admitting: Internal Medicine

## 2018-12-15 NOTE — Telephone Encounter (Signed)
Spoke with pt due to [place] from remote transmission.States he has had increased SHOB the past 2 weeks.No weight gain, no CP. Reports he is due for f/u with pulmonologist. Encouraged to schedule appointment with pulmonologist. Reported Covid-19 test done at PCP recommendation last week and was negative. Educated on [employer] program and pt agreed to enrollment in program.Will contact Sharman Cheek RN to follow. r

## 2018-12-15 NOTE — Telephone Encounter (Signed)
Noted  

## 2018-12-21 ENCOUNTER — Telehealth: Payer: Self-pay | Admitting: Family

## 2018-12-21 ENCOUNTER — Ambulatory Visit: Payer: Medicare Other | Admitting: Family

## 2018-12-21 NOTE — Telephone Encounter (Signed)
Patient did not show for his Heart Failure Clinic appointment on 12/21/2018. Will attempt to reschedule.  

## 2018-12-22 ENCOUNTER — Telehealth: Payer: Self-pay | Admitting: Internal Medicine

## 2018-12-22 NOTE — Telephone Encounter (Signed)
Call from [facility]. Per Anderson Malta, the patient is up in the CHF clinic- he showed up on the wrong day for his CHF appt. He is asking to come down here and be seen for Device "issues."  Per notation in his chart from 12/15/18- a Corvue transmission was received. His Corvue readings were ok at the time of transmission, but has been elevated for ~ 7 days at the end of May.  He had complaints of SOB for 2 weeks, but no weight gain. He was instructed on 6/11 to follow up with his pulmonologist.  Reviewed the patient's chart with Dr. Caryl Comes. He is in agreement the patient needs to see pulmonary.   I have notified Marissa Calamity and she has messaged the CHF clinic as well.

## 2018-12-22 NOTE — Telephone Encounter (Signed)
Patient showed up to hf clinic on wrong day and would like to be seen today for device issues   See previous phone note .  Can this be done?

## 2019-01-05 ENCOUNTER — Telehealth: Payer: Self-pay

## 2019-01-05 MED ORDER — TORSEMIDE 20 MG PO TABS: ORAL_TABLET | ORAL | 0 refills | Status: DC

## 2019-01-05 NOTE — Telephone Encounter (Signed)
Requested Prescriptions   Signed Prescriptions Disp Refills  . torsemide (DEMADEX) 20 MG tablet 270 tablet 0    Sig: 2 tablets (40 mg) in the AM and 1 tablet (20 mg) at lunch.    Authorizing Provider: Minna Merritts    Ordering User: Raelene Bott, BRANDY L

## 2019-01-09 ENCOUNTER — Telehealth: Payer: Self-pay | Admitting: Pulmonary Disease

## 2019-01-09 NOTE — Telephone Encounter (Signed)
Spoke to patient. He has been fighting pneumonia since the end of December. Went to PCP, they gave him two different types antibiotics. Still have difficulties, would like to be seen. Scheduled for next week with Dr. Alva Garnet.

## 2019-01-09 NOTE — Telephone Encounter (Signed)
Pt called to make an appt. Because he is "sick" Having SOB, congestion, a lot of sinus issues, cough for 1 month, denies a fever. I let pt know that someone will give him a call back with an update.

## 2019-01-17 ENCOUNTER — Telehealth: Payer: Self-pay | Admitting: Pulmonary Disease

## 2019-01-17 NOTE — Telephone Encounter (Signed)
Called patient for COVID-19 pre-screening for in office visit.  Have you recently traveled any where out of the local area in the last 2 weeks? no  Have you been in close contact with a person diagnosed with COVID-19 or someone awaiting results within the last 2 weeks? no  Do you currently have any of the following symptoms? If so, when did they start? Cough-Occ x12 years    Diarrhea   Joint Pain Fever      Muscle Pain   Red eyes Shortness of breath-present 12 years   Abdominal pain  Vomiting Loss of smell-present 12 years (feels related to sinus)    Rash    Sore Throat Headache    Weakness   Bruising or bleeding   Okay to proceed with visit.

## 2019-01-18 ENCOUNTER — Telehealth: Payer: Self-pay

## 2019-01-18 NOTE — Telephone Encounter (Signed)
Patient referred to [facility] clinic by device clinic nurse by Jodi Geralds, RN and approved to follow by Dr Caryl Comes.   Call to patient to discuss ICM program and current Corvue report which is suggesting possible ongoing fluid accumulation starting 7/9.  Attempted to discuss transmission results with patient but he was expressing his how he feels using curse words throughout the conversation. He stated someone needs to fix his problems and stop masking everything with so many pills.  Attempted to discuss current Torsemide dosage and upcoming visit with Dr Rockey Situ on 7/24.  He said to tell Dr Rockey Situ he needs something to make his bladder empty to get rid of the fluid and expects Dr Rockey Situ to speak with his urologist.  Patient unwilling to listen or allow me to speak about the fluid issue.   Unable to enroll in [facility] clinic.  Update routed to Dr Rockey Situ for patient's upcoming appointment.    Routed to Dr Rockey Situ and his nurse.  Also routed to Dr Caryl Comes to let him know unable to enroll in ICM.   Remote transmission scheduled in [place] for 7/20 in the event Dr Rockey Situ would like to review it.   Corvue Thoracic direct trend view through 01/17/2019.

## 2019-01-18 NOTE — Telephone Encounter (Signed)
Contacted pt to verify CPAP machine use. Pt stated that he has not used the machine in 10 years. Stated the machine was actually his daughters and neither of them can tolerate it.

## 2019-01-19 ENCOUNTER — Encounter: Payer: Self-pay | Admitting: Pulmonary Disease

## 2019-01-19 ENCOUNTER — Other Ambulatory Visit: Payer: Self-pay

## 2019-01-19 ENCOUNTER — Ambulatory Visit (INDEPENDENT_AMBULATORY_CARE_PROVIDER_SITE_OTHER): Payer: Medicare Other | Admitting: Pulmonary Disease

## 2019-01-19 VITALS — BP 144/80 | HR 69 | Temp 97.2°F | Ht 68.0 in | Wt 262.2 lb

## 2019-01-19 DIAGNOSIS — G4734 Idiopathic sleep related nonobstructive alveolar hypoventilation: Secondary | ICD-10-CM

## 2019-01-19 DIAGNOSIS — J449 Chronic obstructive pulmonary disease, unspecified: Secondary | ICD-10-CM | POA: Diagnosis not present

## 2019-01-19 MED ORDER — BUDESONIDE 0.25 MG/2ML IN SUSP: 0.2500 mg | Freq: Two times a day (BID) | RESPIRATORY_TRACT | 11 refills | Status: DC

## 2019-01-19 NOTE — Patient Instructions (Signed)
New prescription: Budesonide (Pulmicort) 0.25 mg nebulized twice a day.  Rinse mouth after use  Continue albuterol inhaler (first-line rescue medicine) as needed for increased chest tightness, shortness of breath, wheezing, cough  Continue nebulized albuterol (second line rescue medicine) as needed for same symptoms as above  Continue oxygen with sleep  Follow-up in 3-4 months.  Call sooner if needed
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
## 2019-01-20 NOTE — Telephone Encounter (Signed)
Noted  

## 2019-01-22 NOTE — Progress Notes (Signed)
PULMONARY OFFICE FOLLOW-UP NOTE  Requesting MD/Service: Heart failure clinic Date of initial consultation: 06/22/18 Reason for consultation:  COPD  PT PROFILE: 68 y.o. male former smoker (40+ pack year history, quit 2017) with mild COPD, ischemic cardiomyopathy (LVEF 45-50%), chronic atrial fibrillation, mild PAH (RVSP est 34 mmHg by echocardiogram).  Previously followed by Dr. Raul Del  DATA: 12/23/16 CT chest: Linear pleural-parenchymal thickening in the LEFT lower lobe is stable. Upper lobe ill-defined centrilobular nodules are suggestive of respiratory bronchiolitis interstitial lung disease. No mediastinal LAN.  10/21/17 Echocardiogram: LVEF 45-50%.  Hypokinesis of inferior and lateral myocardium.  Mild MR.  LA severely dilated.  RVSP estimate 34 mmHg 11/22/17 Spirometry [facility]): FVC: 3.62 L (105 %pred), FEV1: 2.35 L (85 %pred), FEV1/FVC: 65% 07/28/18: FVC: 3.81 > 3.86 L (96 > 97 %pred), FEV1: 2.63 > 2.70 L (86 > 88 %pred), FEV1/FVC: 69%, TLC: 5.56 L (86 %pred), DLCO 65 %pred.  Flow volume curve consistent with mild obstruction 10/03/18 ONO: SpO2 < 88% 38 mins. Nadir SpO2 81%. Nocturnal O2 initiated    INTERVAL: Last visit 08/04/18.  No major events in the interim  SUBJ:  This is a scheduled follow-up. He reports chronic productive cough with discolored mucus. He also note dysphagia with food getting stuck in his throat. GI eval to be undertaken soon. He is on no controlled regimen and uses albuterol (MDI and nebs) around 5x/day He was intolerant to Anoro believing that it was causing throat irritation.    Vitals:   01/19/19 1150  BP: (!) 144/80  Pulse: 69  Temp: (!) 97.2 F (36.2 C)  TempSrc: Skin  SpO2: 97%  Weight: 262 lb 3.2 oz (118.9 kg)  Height: 5\' 8"  (1.727 m)  RA  EXAM:  Gen: Obese, NAD HEENT: NCAT, sclerae white Neck: No JVD Lungs: breath sounds full, no wheezes or other adventitious sounds Cardiovascular: RRR, no murmurs Abdomen: Soft, nontender, normal BS Ext:  without clubbing, cyanosis. 1-2+ edema Neuro: grossly intact Skin: Limited exam, no lesions noted   DATA:   BMP Latest Ref Rng & Units 11/17/2017 10/21/2017 09/23/2017  Glucose 65 - 99 mg/dL 160(H) 103(H) 113(H)  BUN 6 - 20 mg/dL 18 19 37(H)  Creatinine 0.61 - 1.24 mg/dL 1.29(H) 1.19 1.73(H)  BUN/Creat Ratio 10 - 24 - 16 -  Sodium 135 - 145 mmol/L 138 141 140  Potassium 3.5 - 5.1 mmol/L 4.0 4.9 3.8  Chloride 101 - 111 mmol/L 105 102 100(L)  CO2 22 - 32 mmol/L 21(L) 23 27  Calcium 8.9 - 10.3 mg/dL 9.5 10.0 9.8    CBC Latest Ref Rng & Units 11/17/2017 10/21/2017 03/22/2017  WBC 3.8 - 10.6 K/uL 9.0 6.9 7.1  Hemoglobin 13.0 - 18.0 g/dL 13.0 13.1 12.9(L)  Hematocrit 40.0 - 52.0 % 37.6(L) 39.2 38.5(L)  Platelets 150 - 440 K/uL 225 270 183   CXR: No new film  I have personally reviewed all chest radiographs reported above including CXRs and CT chest unless otherwise indicated  IMPRESSION:     ICD-10-CM   1. COPD, mild ([place])  J44.9   2. Chronic asthmatic bronchitis (HCC)  J44.9    Intolerant to Anoro Has history of seasonal allergies Also has PAF and deconditioning as contributors to chronic DOE  PLAN:  New prescription: Budesonide (Pulmicort) 0.25 mg nebulized twice a day.  Rinse mouth after use  Continue albuterol inhaler (first-line rescue medicine) as needed for increased chest tightness, shortness of breath, wheezing, cough  Continue nebulized albuterol (second line rescue medicine) as  needed for same symptoms as above  Continue oxygen with sleep  Follow-up in 3-4 months.  Call sooner if needed   Billy Fischeravid Yuri Flener, MD PCCM service Mobile [phone] Pager [phone] 01/22/2019 9:29 PM

## 2019-01-23 ENCOUNTER — Telehealth: Payer: Self-pay | Admitting: Cardiovascular Disease

## 2019-01-23 NOTE — Telephone Encounter (Signed)

## 2019-01-24 ENCOUNTER — Ambulatory Visit (INDEPENDENT_AMBULATORY_CARE_PROVIDER_SITE_OTHER): Payer: Medicare Other | Admitting: Cardiovascular Disease

## 2019-01-24 ENCOUNTER — Other Ambulatory Visit: Payer: Self-pay

## 2019-01-24 VITALS — BP 130/64 | HR 74 | Ht 67.0 in | Wt 265.0 lb

## 2019-01-24 DIAGNOSIS — I950 Idiopathic hypotension: Secondary | ICD-10-CM

## 2019-01-24 DIAGNOSIS — I5022 Chronic systolic (congestive) heart failure: Secondary | ICD-10-CM

## 2019-01-24 DIAGNOSIS — I890 Lymphedema, not elsewhere classified: Secondary | ICD-10-CM

## 2019-01-24 DIAGNOSIS — N183 Chronic kidney disease, stage 3 (moderate): Secondary | ICD-10-CM

## 2019-01-24 DIAGNOSIS — I25118 Atherosclerotic heart disease of native coronary artery with other forms of angina pectoris: Secondary | ICD-10-CM

## 2019-01-24 DIAGNOSIS — G4733 Obstructive sleep apnea (adult) (pediatric): Secondary | ICD-10-CM

## 2019-01-24 DIAGNOSIS — I255 Ischemic cardiomyopathy: Secondary | ICD-10-CM

## 2019-01-24 DIAGNOSIS — J432 Centrilobular emphysema: Secondary | ICD-10-CM | POA: Diagnosis not present

## 2019-01-24 DIAGNOSIS — N179 Acute kidney failure, unspecified: Secondary | ICD-10-CM

## 2019-01-24 MED ORDER — TORSEMIDE 20 MG PO TABS: 40.0000 mg | ORAL_TABLET | Freq: Two times a day (BID) | ORAL | 3 refills | Status: DC

## 2019-01-24 NOTE — Patient Instructions (Addendum)
Medication Instructions:  Your physician has recommended you make the following change in your medication:  1. INCREASE Torsemide 20 mg up to 2 tablets (40 mg) twice a day.  If you need a refill on your cardiac medications before your next appointment, please call your pharmacy.    Lab work: CMP done today.    If you have labs (blood work) drawn today and your tests are completely normal, you will receive your results only by: Marland Kitchen MyChart Message (if you have MyChart) OR . A paper copy in the mail If you have any lab test that is abnormal or we need to change your treatment, we will call you to review the results.   Testing/Procedures: No new testing needed   Follow-Up: At [facility], you and your health needs are our priority.  As part of our continuing mission to provide you with exceptional heart care, we have created designated Provider Care Teams.  These Care Teams include your primary Cardiologist (physician) and Advanced Practice Providers (APPs -  Physician Assistants and Nurse Practitioners) who all work together to provide you with the care you need, when you need it.  . You will need a follow up appointment in 6 months .   Please call our office 2 months in advance to schedule this appointment.    . Providers on your designated Care Team:   . Murray Hodgkins, NP . Christell Faith, PA-C . Marrianne Mood, PA-C  Any Other Special Instructions Will Be Listed Below (If Applicable).  For educational health videos Log in to : [email or web address] Or : [email or web address], password : [email or web address]

## 2019-01-25 LAB — COMPREHENSIVE METABOLIC PANEL
ALT: 19 IU/L (ref 0–44)
AST: 21 IU/L (ref 0–40)
Albumin/Globulin Ratio: 1.5 (ref 1.2–2.2)
Albumin: 4.3 g/dL (ref 3.8–4.8)
Alkaline Phosphatase: 51 IU/L (ref 39–117)
BUN/Creatinine Ratio: 13 (ref 10–24)
BUN: 18 mg/dL (ref 8–27)
Bilirubin Total: 0.4 mg/dL (ref 0.0–1.2)
CO2: 21 mmol/L (ref 20–29)
Calcium: 9.7 mg/dL (ref 8.6–10.2)
Chloride: 100 mmol/L (ref 96–106)
Creatinine, Ser: 1.38 mg/dL — ABNORMAL HIGH (ref 0.76–1.27)
GFR calc Af Amer: 61 mL/min/{1.73_m2} (ref >59)
GFR calc non Af Amer: 53 mL/min/{1.73_m2} — ABNORMAL LOW (ref >59)
Globulin, Total: 2.8 g/dL (ref 1.5–4.5)
Glucose: 133 mg/dL — ABNORMAL HIGH (ref 65–99)
Potassium: 4.4 mmol/L (ref 3.5–5.2)
Sodium: 142 mmol/L (ref 134–144)
Total Protein: 7.1 g/dL (ref 6.0–8.5)

## 2019-01-26 ENCOUNTER — Telehealth: Payer: Self-pay | Admitting: *Deleted

## 2019-01-26 NOTE — Telephone Encounter (Signed)
-----   Message from Minna Merritts, MD sent at 01/26/2019  2:54 PM EDT ----- Stable lab work, I think he should be okay on torsemide 40 twice daily We will need to make sure he does not get too dehydrated Little bit extra out might help the breathing

## 2019-01-26 NOTE — Telephone Encounter (Signed)
No answer. Left message to call back.   

## 2019-01-27 ENCOUNTER — Encounter: Payer: Self-pay | Admitting: *Deleted

## 2019-01-27 NOTE — Telephone Encounter (Signed)
Results called to pt. Pt verbalized understanding. He asked that I mail him a copy of the results. This has been done.

## 2019-01-27 NOTE — Telephone Encounter (Signed)
Patient returning call for results .    He would like these emailed to him so his daughter can review.   Patient educated about mychart but declined to use and declined to have daughter help him with sign up.

## 2019-01-30 ENCOUNTER — Other Ambulatory Visit: Payer: Self-pay

## 2019-01-30 ENCOUNTER — Ambulatory Visit: Payer: Medicare Other | Admitting: Psychiatry

## 2019-02-13 ENCOUNTER — Telehealth: Payer: Self-pay | Admitting: Cardiovascular Disease

## 2019-02-13 NOTE — Telephone Encounter (Signed)
Pt c/o medication issue:  1. Name of Medication: torsemide   2. How are you currently taking this medication (dosage and times per day)? Please confirm dose oco sending rx also   3. Are you having a reaction (difficulty breathing--STAT)? no  4. What is your medication issue? Pharmacy calling to confirm dose

## 2019-02-13 NOTE — Telephone Encounter (Signed)
Spoke with pharmacist at [facility]. He is calling to confirm torsemide dosage.  On 8/6, patient's PCP, Dr Guadelupe Sabin, sent in prescription for torsemide 40 mg once a day. This can be viewed in [place]. On 01/24/19, Dr Rockey Situ increased to 40 mg two times a day.  Pharmacist spoke with patient who was still under the impression he was to take torsemide 40 mg two times a day. Pharmacist is going to have patient reach out to his PCP to see if this was intentionally changed.  Otherwise, Dr Rockey Situ had torsemide 40 mg two times a day. Advised pharmacist to have patient call us if needed.

## 2019-02-21 ENCOUNTER — Ambulatory Visit (INDEPENDENT_AMBULATORY_CARE_PROVIDER_SITE_OTHER): Payer: Medicare Other | Admitting: *Deleted

## 2019-02-21 DIAGNOSIS — I255 Ischemic cardiomyopathy: Secondary | ICD-10-CM | POA: Diagnosis not present

## 2019-02-21 LAB — CUP PACEART REMOTE DEVICE CHECK
Battery Remaining Longevity: 28 mo
Battery Remaining Percentage: 31 %
Battery Voltage: 2.89 V
Date Time Interrogation Session: 20200818080025
HighPow Impedance: 68 Ohm
HighPow Impedance: 68 Ohm
Implantable Lead Implant Date: 20131003
Implantable Lead Implant Date: 20131003
Implantable Lead Location: 753858
Implantable Lead Location: 753860
Implantable Pulse Generator Implant Date: 20131003
Lead Channel Impedance Value: 450 Ohm
Lead Channel Impedance Value: 960 Ohm
Lead Channel Pacing Threshold Amplitude: 0.625 V
Lead Channel Pacing Threshold Amplitude: 1.25 V
Lead Channel Pacing Threshold Pulse Width: 0.5 ms
Lead Channel Pacing Threshold Pulse Width: 0.6 ms
Lead Channel Sensing Intrinsic Amplitude: 11 mV
Lead Channel Setting Pacing Amplitude: 2 V
Lead Channel Setting Pacing Amplitude: 2.5 V
Lead Channel Setting Pacing Pulse Width: 0.5 ms
Lead Channel Setting Pacing Pulse Width: 0.6 ms
Lead Channel Setting Sensing Sensitivity: 0.5 mV
Pulse Gen Serial Number: 7055215

## 2019-03-02 ENCOUNTER — Encounter: Payer: Self-pay | Admitting: Cardiology

## 2019-03-02 NOTE — Progress Notes (Signed)
Remote ICD transmission.   

## 2019-03-09 ENCOUNTER — Ambulatory Visit: Payer: Medicare Other | Admitting: Psychiatry

## 2019-03-14 ENCOUNTER — Other Ambulatory Visit: Payer: Self-pay

## 2019-03-14 ENCOUNTER — Encounter: Payer: Self-pay | Admitting: Internal Medicine

## 2019-03-14 ENCOUNTER — Ambulatory Visit (INDEPENDENT_AMBULATORY_CARE_PROVIDER_SITE_OTHER): Payer: Medicare Other | Admitting: Internal Medicine

## 2019-03-14 VITALS — BP 110/68 | HR 71 | Ht 67.5 in | Wt 265.8 lb

## 2019-03-14 DIAGNOSIS — I255 Ischemic cardiomyopathy: Secondary | ICD-10-CM

## 2019-03-14 DIAGNOSIS — Z95810 Presence of automatic (implantable) cardiac defibrillator: Secondary | ICD-10-CM

## 2019-03-14 DIAGNOSIS — I4821 Permanent atrial fibrillation: Secondary | ICD-10-CM | POA: Diagnosis not present

## 2019-03-14 DIAGNOSIS — I5022 Chronic systolic (congestive) heart failure: Secondary | ICD-10-CM | POA: Diagnosis not present

## 2019-03-14 DIAGNOSIS — I493 Ventricular premature depolarization: Secondary | ICD-10-CM

## 2019-03-14 MED ORDER — CEPHALEXIN 500 MG PO CAPS: ORAL_CAPSULE | ORAL | 0 refills | Status: DC

## 2019-03-14 NOTE — Progress Notes (Signed)
Patient Care Team: Sharilyn SitesHeffington, Mark, MD as PCP - General (Family Medicine)   HPI  Kevin EngCarmen Pearson is a 68 y.o. male seen in follow-up for an CRT-ICD following a upgrade from [place] pac[place] 2013 [place]  He has freq PVCs which decrease BiV pacing.  The patient denies chest pain, nocturnal dyspnea , orthopnea  or peripheral edema.  There have been lightheadedness or syncope  Significant dyspnea,  Seeing D simonds who has been helpful  Also some palpitations .    History of coronary artery disease with prior multiple revascularizations.  Atrial fibrillation-permanent anticoagulated with Xarelto.  DATE TEST EF   2017 Echo   35 %   4/19/  Echo   45-50 %   4/19 LHC 35-45% LAD 30 Cx T RCA 40   Date Cr K Hgb  3/19 1.73 3.8   5/19 1.29 4.0 13.0  7/20 1.38 4.4    Complex neuropsychiatric regime, currently without psyciatry   Records and Results Reviewed   Past Medical History:  Diagnosis Date  . Acid reflux   . Anxiety   . Arthritis   . Atrial fibrillation (HCC)   . CHF (congestive heart failure) (HCC)   . Chronic orthostatic hypotension   . Clotting disorder (HCC)   . COPD (chronic obstructive pulmonary disease) (HCC)   . Depression   . Elevated PSA   . Heart attack (HCC)   . Heart disease   . Heart failure (HCC)   . Hepatitis   . High cholesterol   . Hypertension   . Sleep apnea   . TBI (traumatic brain injury) (HCC)   . Urinary retention     Past Surgical History:  Procedure Laterality Date  . BLADDER SURGERY    . cardiac stents    . CYSTOSCOPY WITH URETHRAL DILATATION N/A 06/25/2016   Procedure: CYSTOSCOPY WITH URETHRAL DILATATION;  Surgeon: Vanna ScotlandAshley Brandon, MD;  Location: [facility] ORS;  Service: Urology;  Laterality: N/A;  . EP IMPLANTABLE DEVICE     St. Jude BiV-ICD  . GREEN LIGHT LASER TURP (TRANSURETHRAL RESECTION OF PROSTATE  2016   done in [place]   . LEFT HEART CATH AND CORONARY ANGIOGRAPHY Left 10/27/2017   Procedure: LEFT HEART  CATH AND CORONARY ANGIOGRAPHY;  Surgeon: Antonieta IbaGollan, Timothy J, MD;  Location: [facility];  Service: Cardiovascular;  Laterality: Left;    Current Meds  Medication Sig  . acetaminophen (TYLENOL) 325 MG tablet Take 650 mg by mouth every 6 (six) hours as needed.   Marland Kitchen. albuterol (VENTOLIN HFA) 108 (90 Base) MCG/ACT inhaler Inhale 2 puffs into the lungs every 6 (six) hours as needed for wheezing or shortness of breath.  . budesonide (PULMICORT) 0.25 MG/2ML nebulizer solution Take 2 mLs (0.25 mg total) by nebulization 2 (two) times daily.  . busPIRone (BUSPAR) 10 MG tablet Take 10 mg by mouth 3 (three) times daily.  . clopidogrel (PLAVIX) 75 MG tablet Take 75 mg by mouth daily.  . divalproex (DEPAKOTE) 500 MG DR tablet Take 500 mg by mouth 2 (two) times daily.   . Elastic Bandages & Supports (T.E.D. BELOW KNEE/X-LARGE) MISC 1 application by Does not apply route daily.  Marland Kitchen. gabapentin (NEURONTIN) 300 MG capsule Take 600 mg by mouth 2 (two) times daily.   Marland Kitchen. ipratropium-albuterol (DUONEB) 0.5-2.5 (3) MG/3ML SOLN Take 3 mLs by nebulization every 6 (six) hours as needed (wheezing/shortness of breath).  . isosorbide mononitrate (IMDUR) 30 MG 24 hr tablet Take 30 mg  by mouth daily.  Marland Kitchen losartan (COZAAR) 25 MG tablet Take 1 tablet (25 mg total) by mouth daily.  . Melatonin 5 MG TABS Take 5 mg by mouth at bedtime.  . metoprolol succinate (TOPROL XL) 25 MG 24 hr tablet Take 1 tablet (25 mg total) by mouth daily.  . nitroGLYCERIN (NITROSTAT) 0.4 MG SL tablet Place 0.4 mg under the tongue every 5 (five) minutes as needed for chest pain.  Marland Kitchen omeprazole (PRILOSEC) 40 MG capsule Take 40 mg by mouth daily.  . rivaroxaban (XARELTO) 20 MG TABS tablet Take 20 mg by mouth at bedtime.  . rosuvastatin (CRESTOR) 20 MG tablet Take 20 mg by mouth daily.  Marland Kitchen senna (SENOKOT) 8.6 MG tablet Take 2 tablets by mouth as needed.   Marland Kitchen spironolactone (ALDACTONE) 25 MG tablet Take 0.5 tablets (12.5 mg total) by mouth daily.  .  tamsulosin (FLOMAX) 0.4 MG CAPS capsule TAKE ONE CAPSULE BY MOUTH EVERY DAY AFTER SUPPER  . torsemide (DEMADEX) 20 MG tablet Take 2 tablets (40 mg total) by mouth 2 (two) times daily.  . traZODone (DESYREL) 50 MG tablet Take 100 mg by mouth at bedtime.  . vortioxetine HBr (TRINTELLIX) 5 MG TABS Take 5 mg by mouth daily.     Allergies  Allergen Reactions  . Mangifera Indica Anaphylaxis  . Other Anaphylaxis, Itching and Other (See Comments)    Mango skin  . Codeine Nausea And Vomiting      Review of Systems negative except from HPI and PMH  Physical Exam BP 110/68 (BP Location: Right Arm, Patient Position: Sitting, Cuff Size: Normal)   Pulse 71   Ht 5' 7.5" (1.715 m)   Wt 265 lb 12 oz (120.5 kg)   BMI 41.01 kg/m  Well developed and well nourished in no acute distress HENT normal E scleral and icterus clear Neck Supple JVP flat; carotids brisk and full Clear to ausculation  Regular rate and rhythm, no murmurs gallops or rub Soft with active bowel sounds No clubbing cyanosis {  Edema Alert and oriented, grossly normal motor and sensory function Skin Warm and Dry multiple excoriations and subcut bleeding on the dorsal surface of L forearm Tenderness in the muscle belly of the medial biceps LN neg- L axilla   ECG  Afib -/12/42  Assessment and  Plan  Ischemic cardiomyopathy  Atrial fibrillation-permanent  Implantable defibrillator-CRT-[employer] Jude  Congestive heart failure-acute/chronic systolic/diastolic class III  Renal insufficiency grade 3  Obesity  Depression question manic depression  COPD  PVCs -freq dual morphologies   Arm excoriations   Repeat echo to assess LA size and LV function with freq PVCs  Consider ranoloazine then for rhythm controll  \ Keflex for skin excoriations to try and prevent cellulitis, not sure the cause of the arm pain, but no adenopathy palpable and no swelling to suggest DVt   We spent more than 50% of our >25 min  visit in face to face counseling regarding the above   Current medicines are reviewed at length with the patient today .  The patient does not  have concerns regarding medicines.

## 2019-03-14 NOTE — Patient Instructions (Addendum)
Medication Instructions:  - Your physician has recommended you make the following change in your medication:   1) Start Keflex 500 mg- take 1 capsule by mouth three times a day x 7 days    If you need a refill on your cardiac medications before your next appointment, please call your pharmacy.   Lab work: - none ordered  If you have labs (blood work) drawn today and your tests are completely normal, you will receive your results only by: Marland Kitchen MyChart Message (if you have MyChart) OR . A paper copy in the mail If you have any lab test that is abnormal or we need to change your treatment, we will call you to review the results.  Testing/Procedures: - Your physician has requested that you have an echocardiogram. Echocardiography is a painless test that uses sound waves to create images of your heart. It provides your doctor with information about the size and shape of your heart and how well your heart's chambers and valves are working. This procedure takes approximately one hour. There are no restrictions for this procedure.  Follow-Up: At [facility], you and your health needs are our priority.  As part of our continuing mission to provide you with exceptional heart care, we have created designated Provider Care Teams.  These Care Teams include your primary Cardiologist (physician) and Advanced Practice Providers (APPs -  Physician Assistants and Nurse Practitioners) who all work together to provide you with the care you need, when you need it. . in 3 months with Dr. Caryl Comes  Any Other Special Instructions Will Be Listed Below (If Applicable). - N/A

## 2019-03-15 ENCOUNTER — Telehealth: Payer: Self-pay | Admitting: Internal Medicine

## 2019-03-15 NOTE — Telephone Encounter (Signed)
Please call patient regarding antibiotics that was prescribed yesterday. States they are making him nauseated. Please call to discuss.

## 2019-03-15 NOTE — Telephone Encounter (Signed)
I spoke with the patient. Dr. Caryl Comes started him on keflex yesterday for skin excoriations to try to prevent cellulitis with his ICD in place.   The patient states he has taken 3 dose with food. He feels nauseated, but no vomiting. He states he forgot to mention this yesterday, but he typically has nausea when he takes antibiotics. He was wanting to know if he could take something for the nausea.   I advised we do not prescribe many antibiotics, but I will review with Dr. Caryl Comes to see what his thoughts are and call him back.

## 2019-03-16 ENCOUNTER — Other Ambulatory Visit: Payer: Self-pay

## 2019-03-16 MED ORDER — CLINDAMYCIN HCL 300 MG PO CAPS: 300.0000 mg | ORAL_CAPSULE | Freq: Three times a day (TID) | ORAL | 0 refills | Status: AC

## 2019-03-16 MED ORDER — METOPROLOL SUCCINATE ER 25 MG PO TB24: 25.0000 mg | ORAL_TABLET | Freq: Every day | ORAL | 3 refills | Status: DC

## 2019-03-16 MED ORDER — LOSARTAN POTASSIUM 25 MG PO TABS: 25.0000 mg | ORAL_TABLET | Freq: Every day | ORAL | 3 refills | Status: DC

## 2019-03-16 NOTE — Telephone Encounter (Signed)
I reviewed the message below with Dr. Caryl Comes by phone. Recommendations are to stop keflex and start clindamycin 300 mg TID x 7 days.   Per Dr. Caryl Comes, it would be unusual for him to have nausea on both the keflex and an antibiotic from a different class of drug.   I have called and notified the patient of the above information and he voices understanding and is agreeable.

## 2019-03-20 ENCOUNTER — Ambulatory Visit (INDEPENDENT_AMBULATORY_CARE_PROVIDER_SITE_OTHER): Payer: Medicare Other | Admitting: Gastroenterology

## 2019-03-20 ENCOUNTER — Encounter: Payer: Self-pay | Admitting: Gastroenterology

## 2019-03-20 ENCOUNTER — Other Ambulatory Visit: Payer: Self-pay

## 2019-03-20 VITALS — BP 119/77 | HR 69 | Temp 96.1°F | Ht 67.5 in | Wt 262.4 lb

## 2019-03-20 DIAGNOSIS — R1310 Dysphagia, unspecified: Secondary | ICD-10-CM

## 2019-03-20 NOTE — Progress Notes (Signed)
Gastroenterology Consultation  Referring Provider:     Linus SalmonsMcQueen, Chapman, MD Primary Care Physician:  Sharilyn SitesHeffington, Mark, MD Primary Gastroenterologist:  Dr. Servando SnareWohl     Reason for Consultation:     Dysphasia        HPI:   Kevin Pearson is a 68 y.o. y/o male referred for consultation & management of dysphasia by Dr. Sharilyn SitesHeffington, Mark, MD.  This patient has a history of cardiomyopathy with COPD and has been seen by Dr. Marva PandaSkulskie and his nurse practitioner in the past.  The patient was seen in 2018 and was set up for a upper endoscopy in 2019 but had a death in the family and some cardiac issues therefore he postponed it.  The patient was sent by ENT to be evaluated for dysphasia today.  In 2018 he had a barium swallow that showed:  IMPRESSION: Small reducible hiatal hernia. Moderate amount of gastroesophageal reflux. No evidence of esophageal stricture or esophagitis.  Reasonably distensible stomach. Thickening of the gastric mucosal folds may reflect gastritis. No discrete gastric mass or ulceration was observed. Gastric emptying was prompt. Normal appearance of the duodenum.  EGD would be a useful next imaging step to further evaluation the Stomach.  The patient has been cleared recently for dental work using nitrous oxide but has always had extensive cardiac issues the last few years.  The patient has dysphagia which he reports stops him from breathing.  He had an exam by Dr. Jenne CampusMcQueen without any abnormalities noted.  He usually has to be hit on the back or have a Heimlich maneuver to relieve the obstruction.  Past Medical History:  Diagnosis Date  . Acid reflux   . Anxiety   . Arthritis   . Atrial fibrillation (HCC)   . CHF (congestive heart failure) (HCC)   . Chronic orthostatic hypotension   . Clotting disorder (HCC)   . COPD (chronic obstructive pulmonary disease) (HCC)   . Depression   . Elevated PSA   . Heart attack (HCC)   . Heart disease   . Heart failure (HCC)   .  Hepatitis   . High cholesterol   . Hypertension   . Sleep apnea   . TBI (traumatic brain injury) (HCC)   . Urinary retention     Past Surgical History:  Procedure Laterality Date  . BLADDER SURGERY    . cardiac stents    . CYSTOSCOPY WITH URETHRAL DILATATION N/A 06/25/2016   Procedure: CYSTOSCOPY WITH URETHRAL DILATATION;  Surgeon: Vanna ScotlandAshley Brandon, MD;  Location: [facility] OR[facility];  Service: Urology;  Laterality: N/A;  . EP IMPLANTABLE DEVICE     St. Jude BiV-ICD  . GREEN LIGHT LASER TURP (TRANSURETHRAL RESECTION OF PROSTATE  2016   done in [place]   . LEFT HEART CATH AND CORONARY ANGIOGRAPHY Left 10/27/2017   Procedure: LEFT HEART CATH AND CORONARY ANGIOGRAPHY;  Surgeon: Antonieta IbaGollan, Timothy J, MD;  Location: [facility];  Service: Cardiovascular;  Laterality: Left;    Prior to Admission medications   Medication Sig Start Date End Date Taking? Authorizing Provider  acetaminophen (TYLENOL) 325 MG tablet Take 650 mg by mouth every 6 (six) hours as needed.     [provider]  albuterol (VENTOLIN HFA) 108 (90 Base) MCG/ACT inhaler Inhale 2 puffs into the lungs every 6 (six) hours as needed for wheezing or shortness of breath. 12/02/18   Merwyn KatosSimonds, David B, MD  budesonide (PULMICORT) 0.25 MG/2ML nebulizer solution Take 2 mLs (0.25 mg total) by nebulization 2 (  two) times daily. 01/19/19 01/19/20  Merwyn Katos, MD  busPIRone (BUSPAR) 10 MG tablet Take 10 mg by mouth 3 (three) times daily.    [provider]  clindamycin (CLEOCIN) 300 MG capsule Take 1 capsule (300 mg total) by mouth 3 (three) times daily for 7 days. 03/16/19 03/23/19  Duke Salvia, MD  clopidogrel (PLAVIX) 75 MG tablet Take 75 mg by mouth daily.    [provider]  divalproex (DEPAKOTE) 500 MG DR tablet Take 500 mg by mouth 2 (two) times daily.  07/30/17   [provider]  Elastic Bandages & Supports (T.E.D. BELOW KNEE/X-LARGE) MISC 1 application by Does not apply route daily. 11/17/17   Willy Eddy, MD  gabapentin (NEURONTIN) 300 MG capsule Take 600 mg by mouth 2 (two) times daily.     [provider]  ipratropium-albuterol (DUONEB) 0.5-2.5 (3) MG/3ML SOLN Take 3 mLs by nebulization every 6 (six) hours as needed (wheezing/shortness of breath).    [provider]  isosorbide mononitrate (IMDUR) 30 MG 24 hr tablet Take 30 mg by mouth daily.    [provider]  losartan (COZAAR) 25 MG tablet Take 1 tablet (25 mg total) by mouth daily. 03/16/19   Antonieta Iba, MD  Melatonin 5 MG TABS Take 5 mg by mouth at bedtime.    [provider]  metoprolol succinate (TOPROL XL) 25 MG 24 hr tablet Take 1 tablet (25 mg total) by mouth daily. 03/16/19   Antonieta Iba, MD  nitroGLYCERIN (NITROSTAT) 0.4 MG SL tablet Place 0.4 mg under the tongue every 5 (five) minutes as needed for chest pain.    [provider]  omeprazole (PRILOSEC) 40 MG capsule Take 40 mg by mouth daily.    [provider]  rivaroxaban (XARELTO) 20 MG TABS tablet Take 20 mg by mouth at bedtime.    [provider]  rosuvastatin (CRESTOR) 20 MG tablet Take 20 mg by mouth daily.    [provider]  senna (SENOKOT) 8.6 MG tablet Take 2 tablets by mouth as needed.     [provider]  spironolactone (ALDACTONE) 25 MG tablet Take 0.5 tablets (12.5 mg total) by mouth daily. 11/15/18   Duke Salvia, MD  tamsulosin (FLOMAX) 0.4 MG CAPS capsule TAKE ONE CAPSULE BY MOUTH EVERY DAY AFTER SUPPER 03/23/17   McGowan, Carollee Herter A, PA-C  torsemide (DEMADEX) 20 MG tablet Take 2 tablets (40 mg total) by mouth 2 (two) times daily. 01/24/19   Antonieta Iba, MD  traZODone (DESYREL) 50 MG tablet Take 100 mg by mouth at bedtime.    [provider]  vortioxetine HBr (TRINTELLIX) 5 MG TABS Take 5 mg by mouth daily.     [provider]    Family History  Problem Relation Age of Onset  . Other Father        Cerebral hemorrhage  . Kidney cancer Neg Hx    . Kidney disease Neg Hx   . Prostate cancer Neg Hx      Social History   Tobacco Use  . Smoking status: Former Smoker    Packs/day: 1.00    Years: 40.00    Pack years: 40.00    Types: Cigarettes    Quit date: 08/2015    Years since quitting: 3.6  . Smokeless tobacco: Never Used  Substance Use Topics  . Alcohol use: Not Currently  . Drug use: No    Allergies as of 03/20/2019 - Review Complete  03/14/2019  Allergen Reaction Noted  . 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 Other Anaphylaxis, Itching, and Other (See Comments) 06/24/2016  . Keflex [cephalexin] Nausea Only 03/16/2019  . Codeine Nausea And Vomiting 06/18/2016    Review of Systems:    All systems reviewed and negative except where noted in HPI.   Physical Exam:  There were no vitals taken for this visit. No LMP for male patient. General:   Alert,  Well-developed, well-nourished, pleasant and cooperative in NAD Head:  Normocephalic and atraumatic. Eyes:  Sclera clear, no icterus.   Conjunctiva pink. Ears:  Normal auditory acuity. Nose:  No deformity, discharge, or lesions. Mouth:  No deformity or lesions,oropharynx pink & moist. Neck:  Supple; no masses or thyromegaly. Lungs:  Respirations even and unlabored.  Clear throughout to auscultation.   No wheezes, crackles, or rhonchi. No acute distress. Heart:  Regular rate and rhythm; no murmurs, clicks, rubs, or gallops. Abdomen:  Normal bowel sounds.  No bruits.  Soft, non-tender and non-distended without masses, hepatosplenomegaly or hernias noted.  No guarding or rebound tenderness.  Negative Carnett sign.   Rectal:  Deferred.  Msk:  Symmetrical without gross deformities.  Good, equal movement & strength bilaterally. Pulses:  Normal pulses noted. Extremities:  No clubbing or edema.  No cyanosis. Neurologic:  Alert and oriented x3;  grossly normal neurologically. Skin:  Intact without significant lesions or rashes.  No jaundice. Lymph Nodes:  No significant  cervical adenopathy. Psych:  Alert and cooperative. Normal mood and affect.  Imaging Studies: No results found.  Assessment and Plan:   Kevin Pearson is a 68 y.o. y/o male who comes in today with a history of dysphasia.  The patient dysphasia is reported to choke him.  The patient reports that he loses his breath when he gets choked and cannot breathe again until the food is brought down or brought up.  The patient will be set up for a modified barium swallow and a barium esophagram.  These will be done due to the patient being a high risk for any endoscopic procedures.  The patient has been explained the plan and agrees with it.  Lucilla Lame, MD. Marval Regal    Note: This dictation was prepared with Dragon dictation along with smaller phrase technology. Any transcriptional errors that result from this process are unintentional.

## 2019-03-23 ENCOUNTER — Telehealth: Payer: Self-pay | Admitting: Gastroenterology

## 2019-03-23 NOTE — Telephone Encounter (Signed)
Pt is calling he saw   Dr. Allen Norris   Monday he was referring to [facility] he has not heard anything and is very upset he will be going out of town on the 29th and  Needs this done

## 2019-03-24 ENCOUNTER — Telehealth: Payer: Self-pay | Admitting: Internal Medicine

## 2019-03-24 NOTE — Telephone Encounter (Signed)
Pt scheduled for modified barium swallow at [facility] on 05/04/19. Pt has been notified of this appt.

## 2019-03-24 NOTE — Telephone Encounter (Signed)
° °  [facility] Pre-operative Risk Assessment    Request for surgical clearance:  1. What type of surgery is being performd? EGD w/ dilation  2. When is this surgery scheduled? TBD  3. What type of clearance is required (medical clearance vs. Pharmacy clearance to hold med vs. Both)? both  4. Are there any medications that need to be held prior to surgery and how long? Not noted  5. Practice name and name of physician performing surgery? [facility] Dr. Lucilla Lame  6. What is your office phone number [phone]   7.   What is your office fax number [phone]  8.   Anesthesia type (None, local, MAC, general) ? Not noted   Kevin Pearson 03/24/2019, 11:44 AM  _________________________________________________________________   (provider comments below)

## 2019-03-24 NOTE — Telephone Encounter (Signed)
Patient with diagnosis of afib on Xarelto for anticoagulation.    Procedure: EGD w/ dilation Date of procedure: TBD  CHADS2-VASc score of  4 (CHF, HTN, AGE, CAD)  CrCl 50 ml/min (adjusted)  Per office protocol, patient can hold Xarelto for 1-2 days prior to procedure.

## 2019-03-27 ENCOUNTER — Telehealth: Payer: Self-pay | Admitting: Gastroenterology

## 2019-03-27 NOTE — Telephone Encounter (Signed)
   Primary Cardiologist: Ida Rogue, MD  Chart reviewed as part of pre-operative protocol coverage. Patient was contacted 03/27/2019 in reference to pre-operative risk assessment for pending surgery as outlined below.  Kevin Pearson was last seen on 03/14/2019 by Dr. Caryl Comes.  Since that day, Kevin Pearson has done well.  Therefore, based on ACC/AHA guidelines, the patient would be at acceptable risk for the planned procedure without further cardiovascular testing.   I will route this recommendation to the requesting party via Epic fax function and remove from pre-op pool.  Please call with questions.  Per our clinical pharmacist, he will need to hold Xarelto for 2 days. I am currently waiting to hear back from Dr. Rockey Situ, I suspect his plavix will need to be held for 5 days, last dose was this morning, therefore EGD and dilatation will need to be delayed until Monday. Will reach out to Dr. Dorothey Baseman nurse tomorrow once I hear back from Dr. Rockey Situ (she will be working in [employer] [phone])  Kevin Pearson, [place] 03/27/2019, 4:54 PM

## 2019-03-27 NOTE — Telephone Encounter (Signed)
Spoke with pt regarding scheduling his EGD. Advised pt I have sent a blood thinner request and cardiac clearance to his cardiologist.

## 2019-03-27 NOTE — Telephone Encounter (Signed)
Patient calling in stating that Dr. Allen Norris now wants to do this procedure on 9/25 and needs this clearance expedited.

## 2019-03-27 NOTE — Telephone Encounter (Signed)
Ok to hold xarelto and plavix as detailed When not taking plavix would take asa 81 mg daily After procedure, restart xarelto plavix, stop asa

## 2019-03-27 NOTE — Telephone Encounter (Signed)
Pt left vm he needs to speak to Ginger ASAP

## 2019-03-27 NOTE — Telephone Encounter (Signed)
Pt left vm for Ginger on Friday he was supposed to hear from [place] and has not please call pt

## 2019-03-27 NOTE — Telephone Encounter (Signed)
I have talked to both the patient and Dr. Allen Norris. GI service wants both Xarelto and plavix held. Dr. Rockey Situ, patient has not had any recent chest pain or shortness of breath, can he hold plavix for 5 days from your perspective? Note, he has taken the plavix this morning, I talked to Ginger who is Dr. Dorothey Baseman RN [phone]), they may have to move the procedure to Monday instead if need to be held for full days.

## 2019-03-28 ENCOUNTER — Encounter: Payer: Self-pay | Admitting: Urology

## 2019-03-28 ENCOUNTER — Ambulatory Visit (INDEPENDENT_AMBULATORY_CARE_PROVIDER_SITE_OTHER): Payer: Medicare Other | Admitting: Urology

## 2019-03-28 ENCOUNTER — Other Ambulatory Visit: Payer: Self-pay

## 2019-03-28 VITALS — BP 127/83 | HR 68 | Ht 67.5 in | Wt 264.0 lb

## 2019-03-28 DIAGNOSIS — N320 Bladder-neck obstruction: Secondary | ICD-10-CM | POA: Diagnosis not present

## 2019-03-28 DIAGNOSIS — R350 Frequency of micturition: Secondary | ICD-10-CM

## 2019-03-28 LAB — BLADDER SCAN AMB NON-IMAGING

## 2019-03-28 NOTE — Telephone Encounter (Signed)
I have called and spoke with Ginger and Mr. Pennisi, recommend hold plavix for 5 days and Xarelto for 2 days while starting on the aspirin 81mg  daily prior to the procedure. Then, after the procedure stop the aspirin and restart on the previous medications. Surgery likely will need to be pushed back to next Monday.

## 2019-03-28 NOTE — Progress Notes (Signed)
03/28/2019 11:00 AM   Camelia Engarmen Joaquin 18-Apr-1951 [id number]  Referring provider: Sharilyn SitesHeffington, Mark, MD [address] [facility] [place],  [place] [id number]  Chief Complaint  Patient presents with  . Follow-up    HPI: 68 year old male with multiple medical comorbidities with a severe bladder neck contracture returns today for routine annual follow-up.  Notably, he underwent office laser procedure of prostate in 2016 in [place].  This was complicated by severe bladder neck contracture which is required dilation in the operating room.  He is also required an office dilation which was not well tolerated.  See previous notes for details.  Previously, he was self cathing daily to keep his bladder neck open.  He does this once weekly is able to pass the catheter without difficulty.  He reports that his urinary symptoms today are severe.  He is primarily bothered by urinary frequency.  He does note that he has had his torsemide increased up to 80 mg for the purpose of diuresis.  He is followed close by cardiology and EP.  PVR today is 0.  No dysuria or gross hematuria.  He is chronically on Flomax.  Overall, he seems to be doing well.  He is going to [place] next month for an extended stay.   IPSS    Row Name 03/28/19 0900         International Prostate Symptom Score   How often have you had the sensation of not emptying your bladder?  Almost always     How often have you had to urinate less than every two hours?  Almost always     How often have you found you stopped and started again several times when you urinated?  Almost always     How often have you found it difficult to postpone urination?  Almost always     How often have you had a weak urinary stream?  Less than half the time     How often have you had to strain to start urination?  Less than half the time     How many times did you typically get up at night to urinate?  3 Times     Total IPSS Score  27        Quality of Life due to urinary symptoms   If you were to spend the rest of your life with your urinary condition just the way it is now how would you feel about that?  Terrible        Score:  1-7 Mild 8-19 Moderate 20-35 Severe    PMH: Past Medical History:  Diagnosis Date  . Acid reflux   . Anxiety   . Arthritis   . Atrial fibrillation (HCC)   . CHF (congestive heart failure) (HCC)   . Chronic orthostatic hypotension   . Clotting disorder (HCC)   . COPD (chronic obstructive pulmonary disease) (HCC)   . Depression   . Elevated PSA   . Heart attack (HCC)   . Heart disease   . Heart failure (HCC)   . Hepatitis   . High cholesterol   . Hypertension   . Sleep apnea   . TBI (traumatic brain injury) (HCC)   . Urinary retention     Surgical History: Past Surgical History:  Procedure Laterality Date  . BLADDER SURGERY    . cardiac stents    . CYSTOSCOPY WITH URETHRAL DILATATION N/A 06/25/2016   Procedure: CYSTOSCOPY WITH URETHRAL DILATATION;  Surgeon: Vanna ScotlandAshley Allon Costlow,  MD;  Location: [facility];  Service: Urology;  Laterality: N/A;  . EP IMPLANTABLE DEVICE     St. Jude BiV-ICD  . GREEN LIGHT LASER TURP (TRANSURETHRAL RESECTION OF PROSTATE  2016   done in [place]   . LEFT HEART CATH AND CORONARY ANGIOGRAPHY Left 10/27/2017   Procedure: LEFT HEART CATH AND CORONARY ANGIOGRAPHY;  Surgeon: Minna Merritts, MD;  Location: [facility];  Service: Cardiovascular;  Laterality: Left;    Home Medications:  Allergies as of 03/28/2019      Reactions   Mangifera Indica Anaphylaxis   Other Anaphylaxis, Itching, Other (See Comments)   Mango skin   Keflex [cephalexin] Nausea Only   Codeine Nausea And Vomiting      Medication List       Accurate as of March 28, 2019 11:00 AM. If you have any questions, ask your nurse or doctor.        acetaminophen 325 MG tablet Commonly known as: TYLENOL Take 650 mg by mouth every 6 (six) hours as needed.   albuterol 108 (90  Base) MCG/ACT inhaler Commonly known as: VENTOLIN HFA Inhale 2 puffs into the lungs every 6 (six) hours as needed for wheezing or shortness of breath.   budesonide 0.25 MG/2ML nebulizer solution Commonly known as: PULMICORT Take 2 mLs (0.25 mg total) by nebulization 2 (two) times daily.   busPIRone 10 MG tablet Commonly known as: BUSPAR Take 10 mg by mouth 3 (three) times daily.   clopidogrel 75 MG tablet Commonly known as: PLAVIX Take 75 mg by mouth daily.   divalproex 500 MG DR tablet Commonly known as: DEPAKOTE Take 500 mg by mouth 2 (two) times daily.   gabapentin 300 MG capsule Commonly known as: NEURONTIN Take 600 mg by mouth 2 (two) times daily.   ipratropium-albuterol 0.5-2.5 (3) MG/3ML Soln Commonly known as: DUONEB Take 3 mLs by nebulization every 6 (six) hours as needed (wheezing/shortness of breath).   isosorbide mononitrate 30 MG 24 hr tablet Commonly known as: IMDUR Take 30 mg by mouth daily.   losartan 25 MG tablet Commonly known as: COZAAR Take 1 tablet (25 mg total) by mouth daily.   Melatonin 5 MG Tabs Take 5 mg by mouth at bedtime.   metoprolol succinate 25 MG 24 hr tablet Commonly known as: Toprol XL Take 1 tablet (25 mg total) by mouth daily.   nitroGLYCERIN 0.4 MG SL tablet Commonly known as: NITROSTAT Place 0.4 mg under the tongue every 5 (five) minutes as needed for chest pain.   omeprazole 40 MG capsule Commonly known as: PRILOSEC Take 40 mg by mouth daily.   rosuvastatin 20 MG tablet Commonly known as: CRESTOR Take 20 mg by mouth daily.   senna 8.6 MG tablet Commonly known as: SENOKOT Take 2 tablets by mouth as needed.   spironolactone 25 MG tablet Commonly known as: ALDACTONE Take 0.5 tablets (12.5 mg total) by mouth daily.   T.E.D. Below Knee/X-Large Misc 1 application by Does not apply route daily.   tamsulosin 0.4 MG Caps capsule Commonly known as: FLOMAX TAKE ONE CAPSULE BY MOUTH EVERY DAY AFTER SUPPER   torsemide 20  MG tablet Commonly known as: DEMADEX Take 2 tablets (40 mg total) by mouth 2 (two) times daily.   traZODone 50 MG tablet Commonly known as: DESYREL Take 100 mg by mouth at bedtime.   Trintellix 5 MG Tabs tablet Generic drug: vortioxetine HBr Take 5 mg by mouth daily.   Xarelto 20 MG Tabs tablet  Generic drug: rivaroxaban Take 20 mg by mouth at bedtime.       Allergies:  Allergies  Allergen Reactions  . Mangifera Indica Anaphylaxis  . Other Anaphylaxis, Itching and Other (See Comments)    Mango skin  . Keflex [Cephalexin] Nausea Only  . Codeine Nausea And Vomiting    Family History: Family History  Problem Relation Age of Onset  . Other Father        Cerebral hemorrhage  . Kidney cancer Neg Hx   . Kidney disease Neg Hx   . Prostate cancer Neg Hx     Social History:  reports that he quit smoking about 3 years ago. His smoking use included cigarettes. He has a 40.00 pack-year smoking history. He has never used smokeless tobacco. He reports previous alcohol use. He reports that he does not use drugs.  ROS: UROLOGY Frequent Urination?: Yes Hard to postpone urination?: Yes Burning/pain with urination?: No Get up at night to urinate?: Yes Leakage of urine?: Yes Urine stream starts and stops?: No Trouble starting stream?: No Do you have to strain to urinate?: No Blood in urine?: No Urinary tract infection?: No Sexually transmitted disease?: No Injury to kidneys or bladder?: No Painful intercourse?: No Weak stream?: No Erection problems?: Yes Penile pain?: No  Gastrointestinal Nausea?: No Vomiting?: No Indigestion/heartburn?: Yes Diarrhea?: No Constipation?: No  Constitutional Fever: No Night sweats?: No Weight loss?: No Fatigue?: Yes  Skin Skin rash/lesions?: No Itching?: No  Eyes Blurred vision?: No Double vision?: No  Ears/Nose/Throat Sore throat?: No Sinus problems?: No  Hematologic/Lymphatic Swollen glands?: No Easy bruising?: No   Cardiovascular Leg swelling?: Yes Chest pain?: No  Respiratory Cough?: No Shortness of breath?: Yes  Endocrine Excessive thirst?: No  Musculoskeletal Back pain?: Yes Joint pain?: Yes  Neurological Headaches?: No Dizziness?: No  Psychologic Depression?: Yes Anxiety?: Yes  Physical Exam: BP 127/83   Pulse 68   Ht 5' 7.5" (1.715 m)   Wt 264 lb (119.7 kg)   BMI 40.74 kg/m   Constitutional:  Alert and oriented, No acute distress. HEENT: [place]ck AT, moist mucus membranes.  Trachea midline, no masses. Cardiovascular: No clubbing, cyanosis, or edema. Respiratory: Normal respiratory effort, no increased work of breathing. GI: Abdomen is soft, nontender, nondistended, obese Extremity: Mild bilateral lower extremity edema Skin: No rashes, bruises or suspicious lesions. Neurologic: Grossly intact, no focal deficits, moving all 4 extremities. Psychiatric: Normal mood and affect.  Laboratory Data: Lab Results  Component Value Date   WBC 9.0 11/17/2017   HGB 13.0 11/17/2017   HCT 37.6 (L) 11/17/2017   MCV 88.5 11/17/2017   PLT 225 11/17/2017    Lab Results  Component Value Date   CREATININE 1.38 (H) 01/24/2019     Pertinent Imaging: Results for orders placed or performed in visit on 03/28/19  Bladder Scan (Post Void Residual) in office  Result Value Ref Range   Scan Result     Assessment & Plan:    1. Bladder neck contracture History of severe post operative bladder neck contracture He has been managing this with self cath once weekly to keep his bladder neck open We discussed again today given his cardiac stability the option of laser incision of the bladder neck Given the degree of scar tissue at the bladder neck, I am concerned that this would not be a durable procedure Risk and benefits of pursuing this were discussed in detail and he elected to abstain at this time which is reasonable Continue self cath  once weekly  2. Urinary frequency Severe urinary  frequency, primarily daytime Likely related to diuretics as a large contributing factor Continue Flomax Adequate bladder emptying today which is reassuring Homero Fellers discussion today about treatment options for frequency, suspect that they will not be helpful thus he is elected to "live with his symptoms" - Bladder Scan (Post Void Residual) in office    Return in about 1 year (around 03/27/2020) for IPSS/ PVR.  Vanna Scotland, MD  [facility] [address] [place], [place] [address] [phone]

## 2019-03-30 ENCOUNTER — Other Ambulatory Visit: Payer: Self-pay

## 2019-03-30 DIAGNOSIS — R1310 Dysphagia, unspecified: Secondary | ICD-10-CM

## 2019-03-31 ENCOUNTER — Telehealth: Payer: Self-pay

## 2019-03-31 ENCOUNTER — Other Ambulatory Visit
Admission: RE | Admit: 2019-03-31 | Discharge: 2019-03-31 | Disposition: A | Discharge status: Home / Self Care | Payer: Medicare Other | Source: Ambulatory Visit | Attending: Gastroenterology | Admitting: Gastroenterology

## 2019-03-31 DIAGNOSIS — Z20828 Contact with and (suspected) exposure to other viral communicable diseases: Secondary | ICD-10-CM | POA: Insufficient documentation

## 2019-03-31 DIAGNOSIS — Z01812 Encounter for preprocedural laboratory examination: Secondary | ICD-10-CM | POA: Insufficient documentation

## 2019-03-31 NOTE — Telephone Encounter (Signed)
Pt notified to of blood thinner clearance from Dr. Rockey Situ. Please see additional information under media.

## 2019-03-31 NOTE — Telephone Encounter (Signed)
-----   Message from Minna Merritts, MD sent at 03/30/2019 10:52 PM EDT ----- I think already addressed 9/22  Ok to hold xarelto and plavix as detailed When not taking plavix would take asa 81 mg daily After procedure, restart xarelto plavix, stop asa  Thx TG ----- Message ----- From: Glennie Isle, CMA Sent: 03/23/2019   4:30 PM EDT To: Minna Merritts, MD

## 2019-04-01 LAB — SARS CORONAVIRUS 2 (TAT 6-24 HRS): SARS Coronavirus 2: NEGATIVE

## 2019-04-03 ENCOUNTER — Telehealth: Payer: Self-pay | Admitting: Gastroenterology

## 2019-04-03 ENCOUNTER — Other Ambulatory Visit: Payer: Self-pay

## 2019-04-03 DIAGNOSIS — R1310 Dysphagia, unspecified: Secondary | ICD-10-CM

## 2019-04-03 NOTE — Telephone Encounter (Signed)
Pt is calling to speak to Ginger regarding his procedure for tomorrow he has a few issues he needs to discuss regarding cardiac clearance and regarding  Stopping his rx Plavix  please call pt

## 2019-04-03 NOTE — Telephone Encounter (Signed)
Patient called for Kevin Pearson.

## 2019-04-03 NOTE — Telephone Encounter (Signed)
Pt left vm for Ginger he states he did arrange a ride with his daughter, he needs to know what the plan is please call pt

## 2019-04-03 NOTE — Telephone Encounter (Signed)
Spoke with pt and advised him, Dr Vicente Males has agreed to do his EGD on Thursday. Pt has been rescheduled to 04/06/19. Pt notified.

## 2019-04-04 ENCOUNTER — Other Ambulatory Visit: Payer: Self-pay

## 2019-04-04 ENCOUNTER — Ambulatory Visit (INDEPENDENT_AMBULATORY_CARE_PROVIDER_SITE_OTHER): Payer: Medicare Other

## 2019-04-04 ENCOUNTER — Encounter: Admission: RE | Payer: Self-pay | Source: Ambulatory Visit

## 2019-04-04 ENCOUNTER — Ambulatory Visit: Admission: RE | Admit: 2019-04-04 | Payer: Medicare Other | Source: Ambulatory Visit | Admitting: Gastroenterology

## 2019-04-04 DIAGNOSIS — I493 Ventricular premature depolarization: Secondary | ICD-10-CM | POA: Diagnosis not present

## 2019-04-04 SURGERY — ESOPHAGOGASTRODUODENOSCOPY (EGD) WITH PROPOFOL
Anesthesia: General

## 2019-04-06 ENCOUNTER — Encounter: Payer: Self-pay | Admitting: *Deleted

## 2019-04-06 ENCOUNTER — Ambulatory Visit
Admission: RE | Admit: 2019-04-06 | Discharge: 2019-04-06 | Disposition: A | Discharge status: Home / Self Care | Payer: Medicare Other | Source: Ambulatory Visit | Attending: Gastroenterology | Admitting: Gastroenterology

## 2019-04-06 ENCOUNTER — Ambulatory Visit: Payer: Medicare Other | Admitting: Registered Nurse

## 2019-04-06 ENCOUNTER — Other Ambulatory Visit: Payer: Self-pay

## 2019-04-06 ENCOUNTER — Encounter
Admission: RE | Disposition: A | Discharge status: Home / Self Care | Payer: Self-pay | Source: Ambulatory Visit | Attending: Gastroenterology

## 2019-04-06 DIAGNOSIS — Z7902 Long term (current) use of antithrombotics/antiplatelets: Secondary | ICD-10-CM | POA: Insufficient documentation

## 2019-04-06 DIAGNOSIS — Z79899 Other long term (current) drug therapy: Secondary | ICD-10-CM | POA: Diagnosis not present

## 2019-04-06 DIAGNOSIS — I2510 Atherosclerotic heart disease of native coronary artery without angina pectoris: Secondary | ICD-10-CM | POA: Insufficient documentation

## 2019-04-06 DIAGNOSIS — R1310 Dysphagia, unspecified: Secondary | ICD-10-CM | POA: Diagnosis not present

## 2019-04-06 DIAGNOSIS — I252 Old myocardial infarction: Secondary | ICD-10-CM | POA: Insufficient documentation

## 2019-04-06 DIAGNOSIS — Z9981 Dependence on supplemental oxygen: Secondary | ICD-10-CM | POA: Diagnosis not present

## 2019-04-06 DIAGNOSIS — I509 Heart failure, unspecified: Secondary | ICD-10-CM | POA: Diagnosis not present

## 2019-04-06 DIAGNOSIS — I110 Hypertensive heart disease with heart failure: Secondary | ICD-10-CM | POA: Insufficient documentation

## 2019-04-06 DIAGNOSIS — I4891 Unspecified atrial fibrillation: Secondary | ICD-10-CM | POA: Diagnosis not present

## 2019-04-06 DIAGNOSIS — J449 Chronic obstructive pulmonary disease, unspecified: Secondary | ICD-10-CM | POA: Insufficient documentation

## 2019-04-06 DIAGNOSIS — F329 Major depressive disorder, single episode, unspecified: Secondary | ICD-10-CM | POA: Diagnosis not present

## 2019-04-06 DIAGNOSIS — F419 Anxiety disorder, unspecified: Secondary | ICD-10-CM | POA: Diagnosis not present

## 2019-04-06 DIAGNOSIS — Z87891 Personal history of nicotine dependence: Secondary | ICD-10-CM | POA: Insufficient documentation

## 2019-04-06 DIAGNOSIS — Z87820 Personal history of traumatic brain injury: Secondary | ICD-10-CM | POA: Diagnosis not present

## 2019-04-06 DIAGNOSIS — K449 Diaphragmatic hernia without obstruction or gangrene: Secondary | ICD-10-CM | POA: Insufficient documentation

## 2019-04-06 DIAGNOSIS — Z9689 Presence of other specified functional implants: Secondary | ICD-10-CM | POA: Insufficient documentation

## 2019-04-06 DIAGNOSIS — Z7951 Long term (current) use of inhaled steroids: Secondary | ICD-10-CM | POA: Insufficient documentation

## 2019-04-06 DIAGNOSIS — E7800 Pure hypercholesterolemia, unspecified: Secondary | ICD-10-CM | POA: Insufficient documentation

## 2019-04-06 DIAGNOSIS — G4730 Sleep apnea, unspecified: Secondary | ICD-10-CM | POA: Diagnosis not present

## 2019-04-06 DIAGNOSIS — G629 Polyneuropathy, unspecified: Secondary | ICD-10-CM | POA: Insufficient documentation

## 2019-04-06 DIAGNOSIS — Z7901 Long term (current) use of anticoagulants: Secondary | ICD-10-CM | POA: Insufficient documentation

## 2019-04-06 DIAGNOSIS — K219 Gastro-esophageal reflux disease without esophagitis: Secondary | ICD-10-CM | POA: Insufficient documentation

## 2019-04-06 HISTORY — PX: ESOPHAGOGASTRODUODENOSCOPY (EGD) WITH PROPOFOL: SHX5813

## 2019-04-06 SURGERY — ESOPHAGOGASTRODUODENOSCOPY (EGD) WITH PROPOFOL
Anesthesia: General

## 2019-04-06 MED ORDER — SODIUM CHLORIDE 0.9 % IV SOLN
INTRAVENOUS | Status: DC
  Administered 2019-04-06: 08:00:00 via INTRAVENOUS

## 2019-04-06 MED ORDER — PROPOFOL 10 MG/ML IV BOLUS
INTRAVENOUS | Status: DC | PRN
  Administered 2019-04-06: 40 mg via INTRAVENOUS
  Administered 2019-04-06: 20 mg via INTRAVENOUS

## 2019-04-06 MED ORDER — LIDOCAINE HCL (PF) 2 % IJ SOLN
INTRAMUSCULAR | Status: DC | PRN
  Administered 2019-04-06: 100 mg via INTRADERMAL

## 2019-04-06 MED ORDER — PROPOFOL 500 MG/50ML IV EMUL
INTRAVENOUS | Status: DC | PRN
  Administered 2019-04-06: 100 ug/kg/min via INTRAVENOUS

## 2019-04-06 NOTE — Op Note (Addendum)
[facility] Patient Name: Kevin Pearson Procedure Date: 04/06/2019 8:11 AM MRN: [id number] Account #: [id number] Date of Birth: 1951/06/06 Admit Type: Outpatient Age: 68 Room: [facility] ENDO ROOM 3 Gender: Male Note Status: Finalized Procedure:            Upper GI endoscopy Indications:          Dysphagia Providers:            Wyline Mood MD, MD Referring MD:         Sharilyn Sites Medicines:            Monitored Anesthesia Care Complications:        No immediate complications. Procedure:            Pre-Anesthesia Assessment:                       - Prior to the procedure, a History and Physical was                        performed, and patient medications, allergies and                        sensitivities were reviewed. The patient's tolerance of                        previous anesthesia was reviewed.                       - The risks and benefits of the procedure and the                        sedation options and risks were discussed with the                        patient. All questions were answered and informed                        consent was obtained.                       - ASA Grade Assessment: III - A patient with severe                        systemic disease.                       After obtaining informed consent, the endoscope was                        passed under direct vision. Throughout the procedure,                        the patient's blood pressure, pulse, and oxygen                        saturations were monitored continuously. The Endoscope                        was introduced through the mouth, and advanced to the                        third  part of duodenum. The upper GI endoscopy was                        accomplished with ease. The patient tolerated the                        procedure well. Findings:      The examined duodenum was normal.      The stomach was normal.      The cardia and gastric fundus were  normal on retroflexion.      A small hiatal hernia was present.      No endoscopic abnormality was evident in the esophagus to explain the       patient's complaint of dysphagia. It was decided, however, to proceed       with dilation at the gastroesophageal junction. A TTS dilator was passed       through the scope. Dilation with a 15-16.5-18 mm balloon dilator was       performed to 18 mm . The inflated baloon was pulled proximally till 15       cm mark ie UES- no clear stricture seen , no resistence felt Biopsies       were taken with a cold forceps for histology. A TTS dilator was passed       through the scope. Dilation with a 15-16.5-18 mm balloon dilator was       performed to 18 mm. The dilation site was examined and showed no change. Impression:           - Normal examined duodenum.                       - Normal stomach.                       - Small hiatal hernia.                       - No endoscopic esophageal abnormality to explain                        patient's dysphagia. Esophagus dilated. Dilated.                        Biopsied. Recommendation:       - Discharge patient to home (with escort).                       - Resume previous diet.                       - Continue present medications.                       - Await pathology results.                       - Return to GI office as previously scheduled.                       - Consider Manometry+/- modified barium swallow if                        issue persists Procedure Code(s):    --- Professional ---  [id number], Esophagogastroduodenoscopy, flexible, transoral;                        with transendoscopic balloon dilation of esophagus                        (less than 30 mm diameter)                       43239, 59, Esophagogastroduodenoscopy, flexible,                        transoral; with biopsy, single or multiple Diagnosis Code(s):    --- Professional ---                       K44.9,  Diaphragmatic hernia without obstruction or                        gangrene                       R13.10, Dysphagia, unspecified CPT copyright 2019 American Medical Association. All rights reserved. The codes documented in this report are preliminary and upon coder review may  be revised to meet current compliance requirements. Wyline MoodKiran Toluwani Ruder, MD Wyline MoodKiran Avalon Coppinger MD, MD 04/06/2019 8:28:55 AM This report has been signed electronically. Number of Addenda: 0 Note Initiated On: 04/06/2019 8:11 AM Estimated Blood Loss: Estimated blood loss: none.      [facility]

## 2019-04-06 NOTE — H&P (Signed)
Kevin Mood, MD [address], [place], [place], [address] [address], [address], [place], [place], [address] Phone: [phone]  Fax: [phone]  Primary Care Physician:  Sharilyn Sites, MD   Pre-Procedure History & Physical: HPI:  Kevin Pearson is a 69 y.o. male is here for an endoscopy    Past Medical History:  Diagnosis Date  . Acid reflux   . Anxiety   . Arthritis   . Atrial fibrillation (HCC)   . CHF (congestive heart failure) (HCC)   . Chronic orthostatic hypotension   . Clotting disorder (HCC)   . COPD (chronic obstructive pulmonary disease) (HCC)   . Depression   . Elevated PSA   . Heart attack (HCC)   . Heart disease   . Heart failure (HCC)   . Hepatitis   . High cholesterol   . Hypertension   . Sleep apnea   . TBI (traumatic brain injury) (HCC)   . Urinary retention     Past Surgical History:  Procedure Laterality Date  . BLADDER SURGERY    . cardiac stents    . CYSTOSCOPY WITH URETHRAL DILATATION N/A 06/25/2016   Procedure: CYSTOSCOPY WITH URETHRAL DILATATION;  Surgeon: Vanna Scotland, MD;  Location: [facility] OR[facility];  Service: Urology;  Laterality: N/A;  . EP IMPLANTABLE DEVICE     St. Jude BiV-ICD  . GREEN LIGHT LASER TURP (TRANSURETHRAL RESECTION OF PROSTATE  2016   done in [place]   . LEFT HEART CATH AND CORONARY ANGIOGRAPHY Left 10/27/2017   Procedure: LEFT HEART CATH AND CORONARY ANGIOGRAPHY;  Surgeon: Antonieta Iba, MD;  Location: [facility];  Service: Cardiovascular;  Laterality: Left;    Prior to Admission medications   Medication Sig Start Date End Date Taking? Authorizing Provider  acetaminophen (TYLENOL) 325 MG tablet Take 650 mg by mouth every 6 (six) hours as needed.    Yes [provider]  albuterol (VENTOLIN HFA) 108 (90 Base) MCG/ACT inhaler Inhale 2 puffs into the lungs every 6 (six) hours as needed for wheezing or shortness of breath. 12/02/18  Yes Merwyn Katos, MD  budesonide (PULMICORT) 0.25 MG/2ML  nebulizer solution Take 2 mLs (0.25 mg total) by nebulization 2 (two) times daily. 01/19/19 01/19/20 Yes Merwyn Katos, MD  busPIRone (BUSPAR) 10 MG tablet Take 10 mg by mouth 3 (three) times daily.   Yes [provider]  divalproex (DEPAKOTE) 500 MG DR tablet Take 500 mg by mouth 2 (two) times daily.  07/30/17  Yes [provider]  Elastic Bandages & Supports (T.E.D. BELOW KNEE/X-LARGE) MISC 1 application by Does not apply route daily. 11/17/17  Yes Willy Eddy, MD  gabapentin (NEURONTIN) 300 MG capsule Take 600 mg by mouth 2 (two) times daily.    Yes [provider]  ipratropium-albuterol (DUONEB) 0.5-2.5 (3) MG/3ML SOLN Take 3 mLs by nebulization every 6 (six) hours as needed (wheezing/shortness of breath).   Yes [provider]  isosorbide mononitrate (IMDUR) 30 MG 24 hr tablet Take 30 mg by mouth daily.   Yes [provider]  losartan (COZAAR) 25 MG tablet Take 1 tablet (25 mg total) by mouth daily. 03/16/19  Yes Gollan, Tollie Pizza, MD  Melatonin 5 MG TABS Take 5 mg by mouth at bedtime.   Yes [provider]  metoprolol succinate (TOPROL XL) 25 MG 24 hr tablet Take 1 tablet (25 mg total) by mouth daily. 03/16/19  Yes Antonieta Iba, MD  nitroGLYCERIN (NITROSTAT) 0.4 MG  SL tablet Place 0.4 mg under the tongue every 5 (five) minutes as needed for chest pain.   Yes [provider]  omeprazole (PRILOSEC) 40 MG capsule Take 40 mg by mouth daily.   Yes [provider]  rivaroxaban (XARELTO) 20 MG TABS tablet Take 20 mg by mouth at bedtime.   Yes [provider]  rosuvastatin (CRESTOR) 20 MG tablet Take 20 mg by mouth daily.   Yes [provider]  senna (SENOKOT) 8.6 MG tablet Take 2 tablets by mouth as needed.    Yes [provider]  spironolactone (ALDACTONE) 25 MG tablet Take 0.5 tablets (12.5 mg total) by mouth daily. 11/15/18  Yes Deboraha Sprang, MD  tamsulosin (FLOMAX) 0.4 MG CAPS capsule TAKE  ONE CAPSULE BY MOUTH EVERY DAY AFTER SUPPER 03/23/17  Yes McGowan, Larene Beach A, PA-C  torsemide (DEMADEX) 20 MG tablet Take 2 tablets (40 mg total) by mouth 2 (two) times daily. 01/24/19  Yes Minna Merritts, MD  traZODone (DESYREL) 50 MG tablet Take 100 mg by mouth at bedtime.   Yes [provider]  vortioxetine HBr (TRINTELLIX) 5 MG TABS Take 5 mg by mouth daily.    Yes [provider]  clopidogrel (PLAVIX) 75 MG tablet Take 75 mg by mouth daily.    [provider]    Allergies as of 04/04/2019 - Review Complete 03/28/2019  Allergen Reaction Noted  . 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 Other Anaphylaxis, Itching, and Other (See Comments) 06/24/2016  . Keflex [cephalexin] Nausea Only 03/16/2019  . Codeine Nausea And Vomiting 06/18/2016    Family History  Problem Relation Age of Onset  . Other Father        Cerebral hemorrhage  . Kidney cancer Neg Hx   . Kidney disease Neg Hx   . Prostate cancer Neg Hx     Social History   Socioeconomic History  . Marital status: Divorced    Spouse name: Not on file  . Number of children: Not on file  . Years of education: Not on file  . Highest education level: Not on file  Occupational History  . Not on file  Social Needs  . Financial resource strain: Not on file  . Food insecurity    Worry: Not on file    Inability: Not on file  . Transportation needs    Medical: Not on file    Non-medical: Not on file  Tobacco Use  . Smoking status: Former Smoker    Packs/day: 1.00    Years: 40.00    Pack years: 40.00    Types: Cigarettes    Quit date: 08/2015    Years since quitting: 3.6  . Smokeless tobacco: Never Used  Substance and Sexual Activity  . Alcohol use: Not Currently  . Drug use: No  . Sexual activity: Never  Lifestyle  . Physical activity    Days per week: Not on file    Minutes per session: Not on file  . Stress: Not on file  Relationships  . Social Herbalist on phone: Not on  file    Gets together: Not on file    Attends religious service: Not on file    Active member of club or organization: Not on file    Attends meetings of clubs or organizations: Not on file    Relationship status: Not on file  . Intimate partner violence    Fear of current or ex partner: Not on file  Emotionally abused: Not on file    Physically abused: Not on file    Forced sexual activity: Not on file  Other Topics Concern  . Not on file  Social History Narrative  . Not on file    Review of Systems: See HPI, otherwise negative ROS  Physical Exam: BP (!) 136/91   Pulse 70   Temp (!) 97.5 F (36.4 C) (Tympanic)   Resp 17   Ht 5' 7.5" (1.715 m)   Wt 118.8 kg   SpO2 100%   BMI 40.43 kg/m  General:   Alert,  pleasant and cooperative in NAD Head:  Normocephalic and atraumatic. Neck:  Supple; no masses or thyromegaly. Lungs:  Clear throughout to auscultation, normal respiratory effort.    Heart:  +S1, +S2, Regular rate and rhythm, No edema. Abdomen:  Soft, nontender and nondistended. Normal bowel sounds, without guarding, and without rebound.   Neurologic:  Alert and  oriented x4;  grossly normal neurologically.  Impression/Plan: Kevin Pearson is here for an endoscopy  to be performed for  evaluation of dysphagia    Risks, benefits, limitations, and alternatives regarding endoscopy and dilation  have been reviewed with the patient.  Questions have been answered.  All parties agreeable.   Kevin MoodKiran Daiveon Markman, MD  04/06/2019, 8:11 AM

## 2019-04-06 NOTE — Anesthesia Postprocedure Evaluation (Signed)
Anesthesia Post Note  Patient: Kevin Pearson  Procedure(s) Performed: ESOPHAGOGASTRODUODENOSCOPY (EGD) WITH PROPOFOL (N/A )  Patient location during evaluation: PACU Anesthesia Type: General Level of consciousness: awake and alert Pain management: pain level controlled Vital Signs Assessment: post-procedure vital signs reviewed and stable Respiratory status: spontaneous breathing, nonlabored ventilation and respiratory function stable Cardiovascular status: blood pressure returned to baseline and stable Postop Assessment: no apparent nausea or vomiting Anesthetic complications: no     Last Vitals:  Vitals:   04/06/19 0850 04/06/19 0900  BP: 139/80 (!) 143/88  Pulse:    Resp:    Temp:    SpO2: 99%     Last Pain:  Vitals:   04/06/19 0900  TempSrc:   PainSc: 0-No pain                 Durenda Hurt

## 2019-04-06 NOTE — Anesthesia Post-op Follow-up Note (Signed)
Anesthesia QCDR form completed.        

## 2019-04-06 NOTE — Anesthesia Preprocedure Evaluation (Addendum)
Anesthesia Evaluation  Patient identified by MRN, date of birth, ID band Patient awake    Reviewed: Allergy & Precautions, H&P , NPO status , Patient's Chart, lab work & pertinent test results  Airway Mallampati: III  TM Distance: >3 FB     Dental  (+) Missing   Pulmonary sleep apnea , COPD (2L while sleeping and as needed),  oxygen dependent, former smoker,           Cardiovascular hypertension, On Medications Angina: denies having any angina for "a long time" + CAD, + Past MI and +CHF  + dysrhythmias Atrial Fibrillation + pacemaker + Cardiac Defibrillator   -Left ventricular ejection fraction, by visual estimation, is 45 to 50%. The left ventricle has mildly decreased function. Normal left ventricular size. There is mildly increased left ventricular hypertrophy. -Left ventricular diastolic Doppler parameters are indeterminate pattern of LV diastolic filling. Mildly elevated PA pressures   Neuro/Psych PSYCHIATRIC DISORDERS Anxiety Depression TBI peipheral neuropathy    GI/Hepatic Neg liver ROS, GERD  Controlled,  Endo/Other  negative endocrine ROS  Renal/GU negative Renal ROS  negative genitourinary   Musculoskeletal   Abdominal   Peds  Hematology negative hematology ROS (+)   Anesthesia Other Findings Past Medical History: No date: Acid reflux No date: Anxiety No date: Arthritis No date: Atrial fibrillation (HCC) No date: CHF (congestive heart failure) (HCC) No date: Chronic orthostatic hypotension No date: Clotting disorder (HCC) No date: COPD (chronic obstructive pulmonary disease) (HCC) No date: Depression No date: Elevated PSA No date: Heart attack (HCC) No date: Heart disease No date: Heart failure (HCC) No date: Hepatitis No date: High cholesterol No date: Hypertension No date: Sleep apnea No date: TBI (traumatic brain injury) (HCC) No date: Urinary retention  Past Surgical History: No date:  BLADDER SURGERY No date: cardiac stents 06/25/2016: CYSTOSCOPY WITH URETHRAL DILATATION; N/A     Comment:  Procedure: CYSTOSCOPY WITH URETHRAL DILATATION;                Surgeon: Vanna Scotland, MD;  Location: [facility] ORS;                Service: Urology;  Laterality: N/A; No date: EP IMPLANTABLE DEVICE     Comment:  St. Jude BiV-ICD 2016: GREEN LIGHT LASER TURP (TRANSURETHRAL RESECTION OF PROSTATE     Comment:  done in [facility]: Lambert Rhonda W  10/27/2017: LEFT HEART CATH AND CORONARY ANGIOGRAPHY; Left     Comment:  Procedure: LEFT HEART CATH AND CORONARY ANGIOGRAPHY;                Surgeon: Antonieta Iba, MD;  Location: [facility] INVASIVE               CV LAB;  Service: Cardiovascular;  Laterality: Left;  BMI    Body Mass Index: 40.43 kg/m      Reproductive/Obstetrics negative OB ROS                            Anesthesia Physical Anesthesia Plan  ASA: III  Anesthesia Plan: General   Post-op Pain Management:    Induction:   PONV Risk Score and Plan: TIVA and Propofol infusion  Airway Management Planned: Natural Airway and Nasal Cannula  Additional Equipment:   Intra-op Plan:   Post-operative Plan:   Informed Consent: I have reviewed the patients History and Physical, chart, labs and discussed the procedure including the risks, benefits and alternatives for the proposed anesthesia with the patient  or authorized representative who has indicated his/her understanding and acceptance.     Dental Advisory Given  Plan Discussed with: Anesthesiologist and CRNA  Anesthesia Plan Comments:         Anesthesia Quick Evaluation

## 2019-04-06 NOTE — Transfer of Care (Signed)
Immediate Anesthesia Transfer of Care Note  Patient: Kevin Pearson  Procedure(s) Performed: ESOPHAGOGASTRODUODENOSCOPY (EGD) WITH PROPOFOL (N/A )  Patient Location: PACU  Anesthesia Type:General  Level of Consciousness: awake and drowsy  Airway & Oxygen Therapy: Patient Spontanous Breathing and Patient connected to face mask oxygen  Post-op Assessment: Report given to RN and Post -op Vital signs reviewed and stable  Post vital signs: Reviewed and stable  Last Vitals:  Vitals Value Taken Time  BP 125/68 04/06/19 0834  Temp 36.1 C 04/06/19 0830  Pulse 70 04/06/19 0834  Resp 15 04/06/19 0834  SpO2 97 % 04/06/19 0834    Last Pain:  Vitals:   04/06/19 0830  TempSrc: Tympanic  PainSc: Asleep         Complications: No apparent anesthesia complications

## 2019-04-07 ENCOUNTER — Telehealth: Payer: Self-pay | Admitting: Internal Medicine

## 2019-04-07 ENCOUNTER — Telehealth: Payer: Self-pay

## 2019-04-07 ENCOUNTER — Encounter: Payer: Self-pay | Admitting: Gastroenterology

## 2019-04-07 LAB — SURGICAL PATHOLOGY

## 2019-04-07 NOTE — Telephone Encounter (Signed)
Follow Up:    Pt is returning your your call.

## 2019-04-07 NOTE — Telephone Encounter (Signed)
Advised pt that he would be called once his echo has been reviewed. Pt verbalized understanding.

## 2019-04-07 NOTE — Telephone Encounter (Signed)
Please call with echo results 

## 2019-04-07 NOTE — Telephone Encounter (Signed)
LM for the pt that we are holding for Dr. Caryl Comes to review his Echo and will call him back early next week with his results.

## 2019-04-09 ENCOUNTER — Encounter: Payer: Self-pay | Admitting: Gastroenterology

## 2019-04-10 ENCOUNTER — Encounter: Payer: Self-pay | Admitting: Gastroenterology

## 2019-04-10 NOTE — Telephone Encounter (Signed)
Informed pt of results. Pt verbalized understanding. 

## 2019-04-10 NOTE — Telephone Encounter (Signed)
Patient is calling back for echo resutls.

## 2019-04-11 ENCOUNTER — Ambulatory Visit: Payer: Medicare Other | Admitting: Psychiatry

## 2019-04-12 LAB — CUP PACEART INCLINIC DEVICE CHECK
Date Time Interrogation Session: 20201007115148
HighPow Impedance: 81 Ohm
Implantable Lead Implant Date: 20131003
Implantable Lead Implant Date: 20131003
Implantable Lead Location: 753858
Implantable Lead Location: 753860
Implantable Pulse Generator Implant Date: 20131003
Lead Channel Impedance Value: 440 Ohm
Lead Channel Impedance Value: 990 Ohm
Lead Channel Pacing Threshold Amplitude: 0.5 V
Lead Channel Pacing Threshold Amplitude: 1.25 V
Lead Channel Pacing Threshold Pulse Width: 0.5 ms
Lead Channel Pacing Threshold Pulse Width: 0.6 ms
Lead Channel Sensing Intrinsic Amplitude: 8.7 mV
Pulse Gen Serial Number: 7055215

## 2019-04-19 ENCOUNTER — Ambulatory Visit: Payer: Medicare Other | Admitting: Urology

## 2019-04-19 IMAGING — CR DG SHOULDER 2+V*L*
4 series · 4 of 4 positions shown · non-contrast
Comparison: Chest x-ray 10/06/2016

CLINICAL DATA: Remote history of Fall with pain

EXAM:
LEFT SHOULDER - 2+ VIEW

[shoulder grashey]
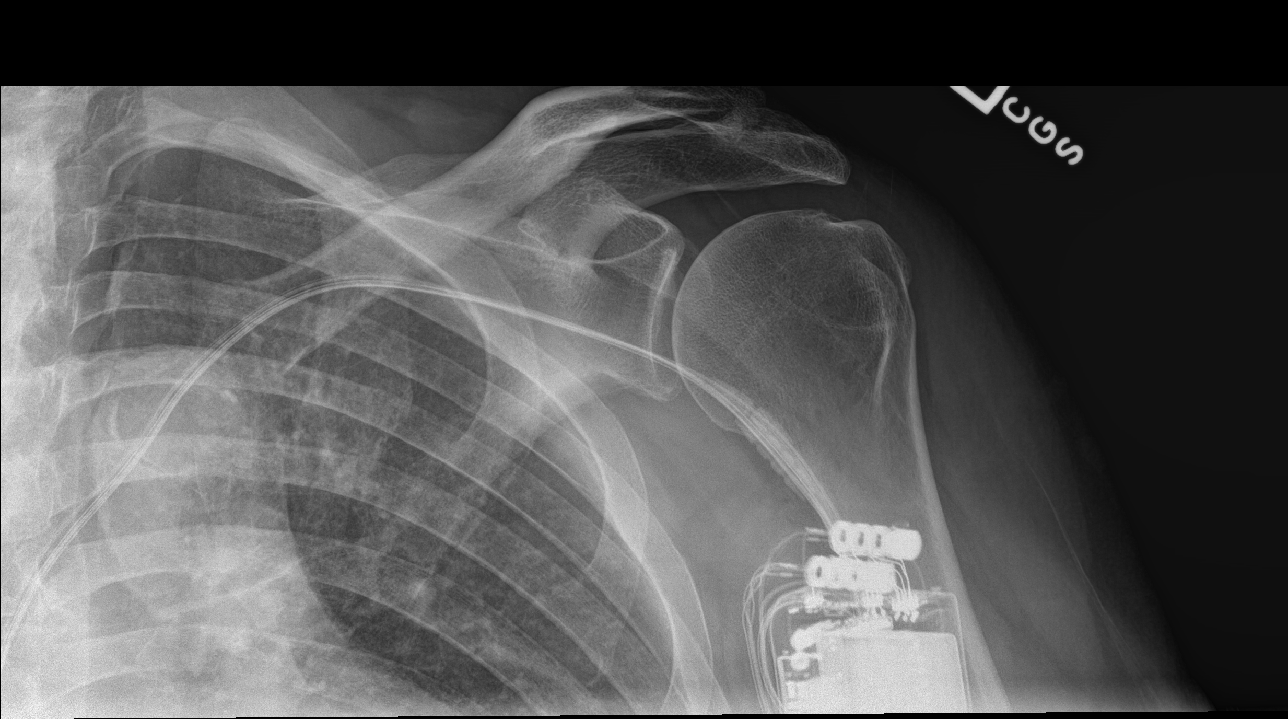

[shoulder y view (1 of 2)]
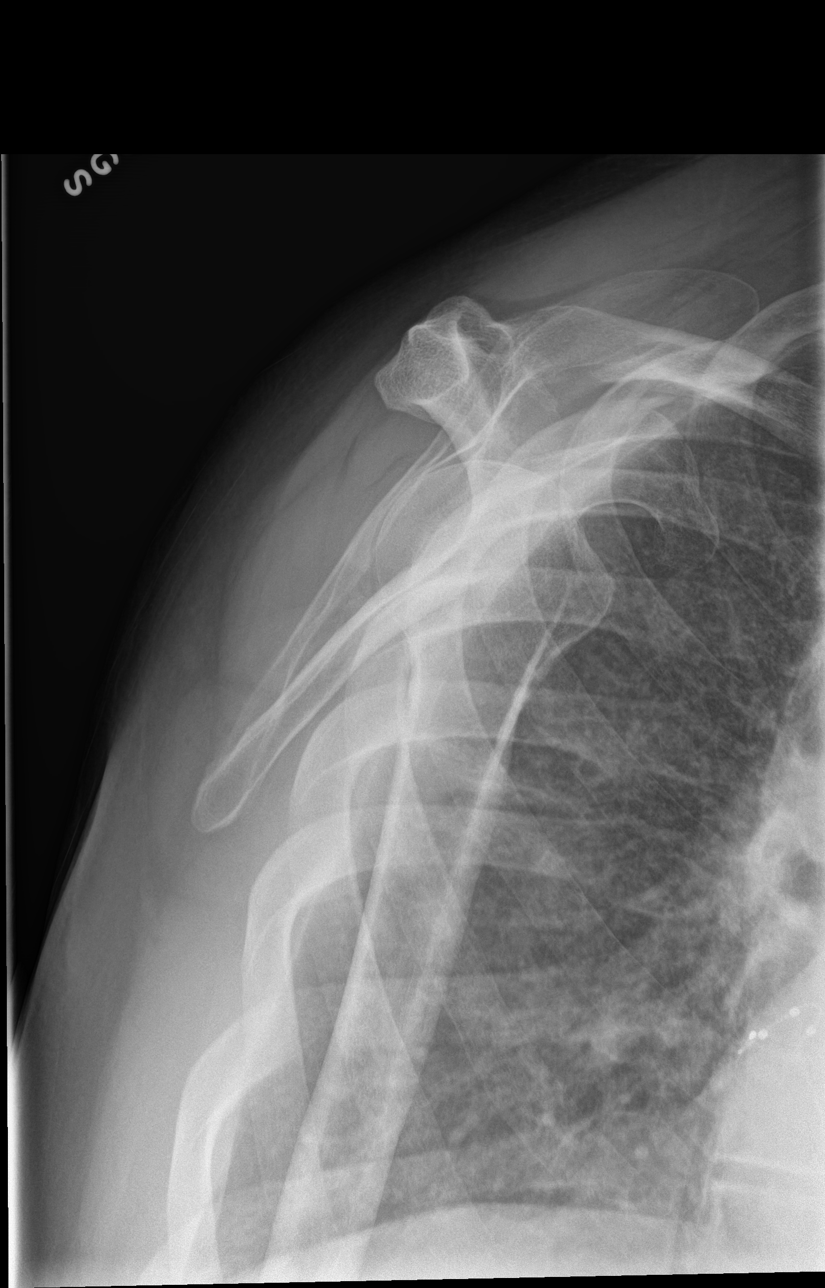

[shoulder axial]
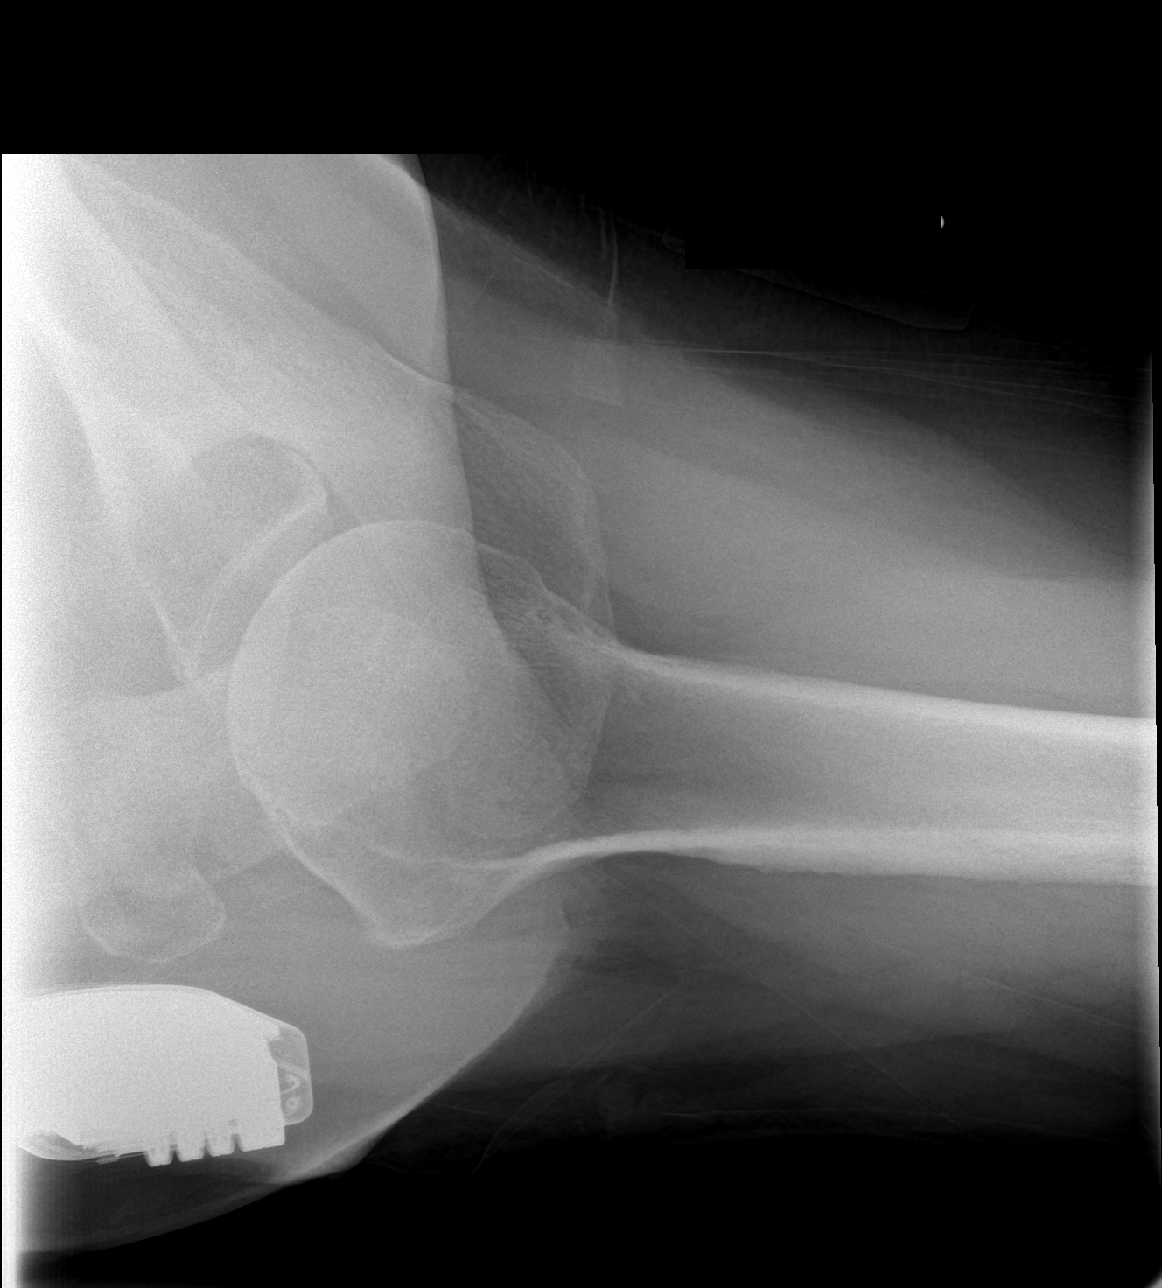

[shoulder y view (2 of 2)]
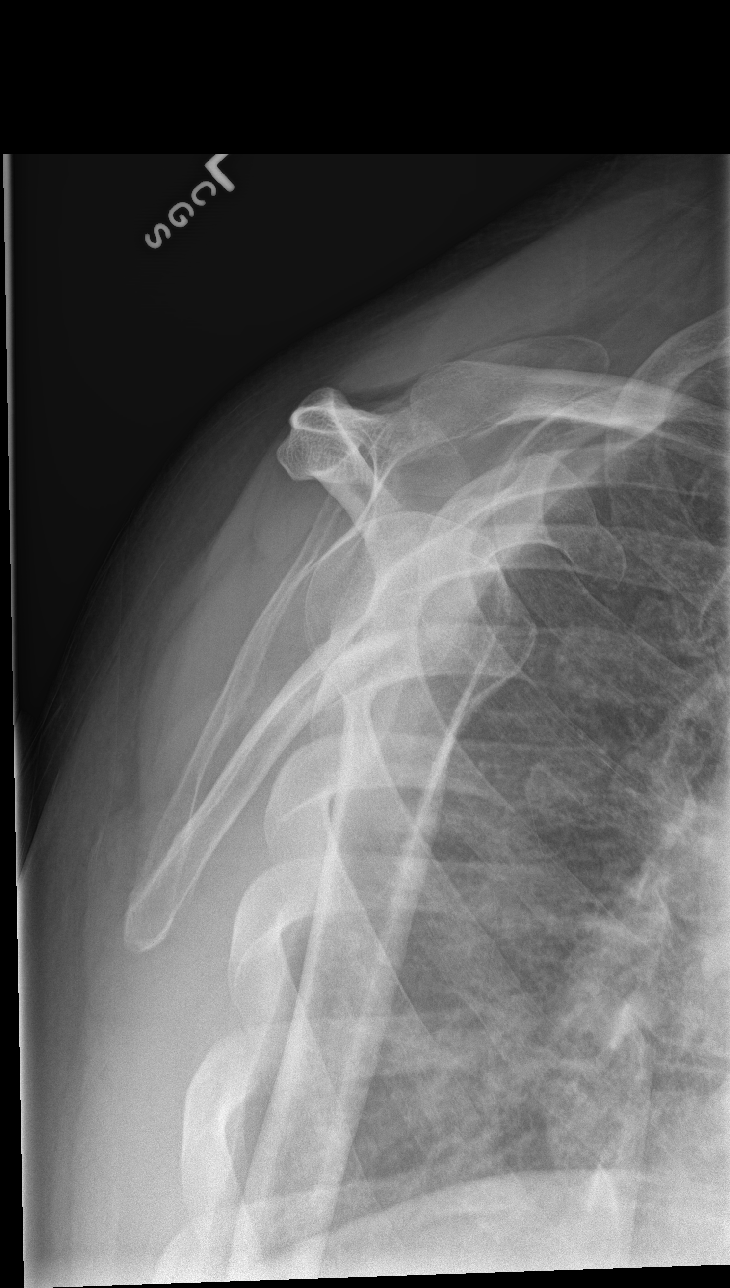

[4 of 4 positions shown; findings below may reference images not displayed]

FINDINGS: There is no evidence of fracture or dislocation. There is no
evidence of arthropathy or other focal bone abnormality. Soft
tissues are unremarkable. Partially visualized pacer generator in
the upper left chest
IMPRESSION: No acute osseous abnormality.

## 2019-04-19 IMAGING — CR DG SHOULDER 2+V*R*
3 series · 3 of 3 positions shown · non-contrast
Comparison: 10/06/2016

CLINICAL DATA: Fall 14 months ago with multiple injuries and rib
fractures bilateral shoulder pain

EXAM:
RIGHT SHOULDER - 2+ VIEW

[shoulder y view]
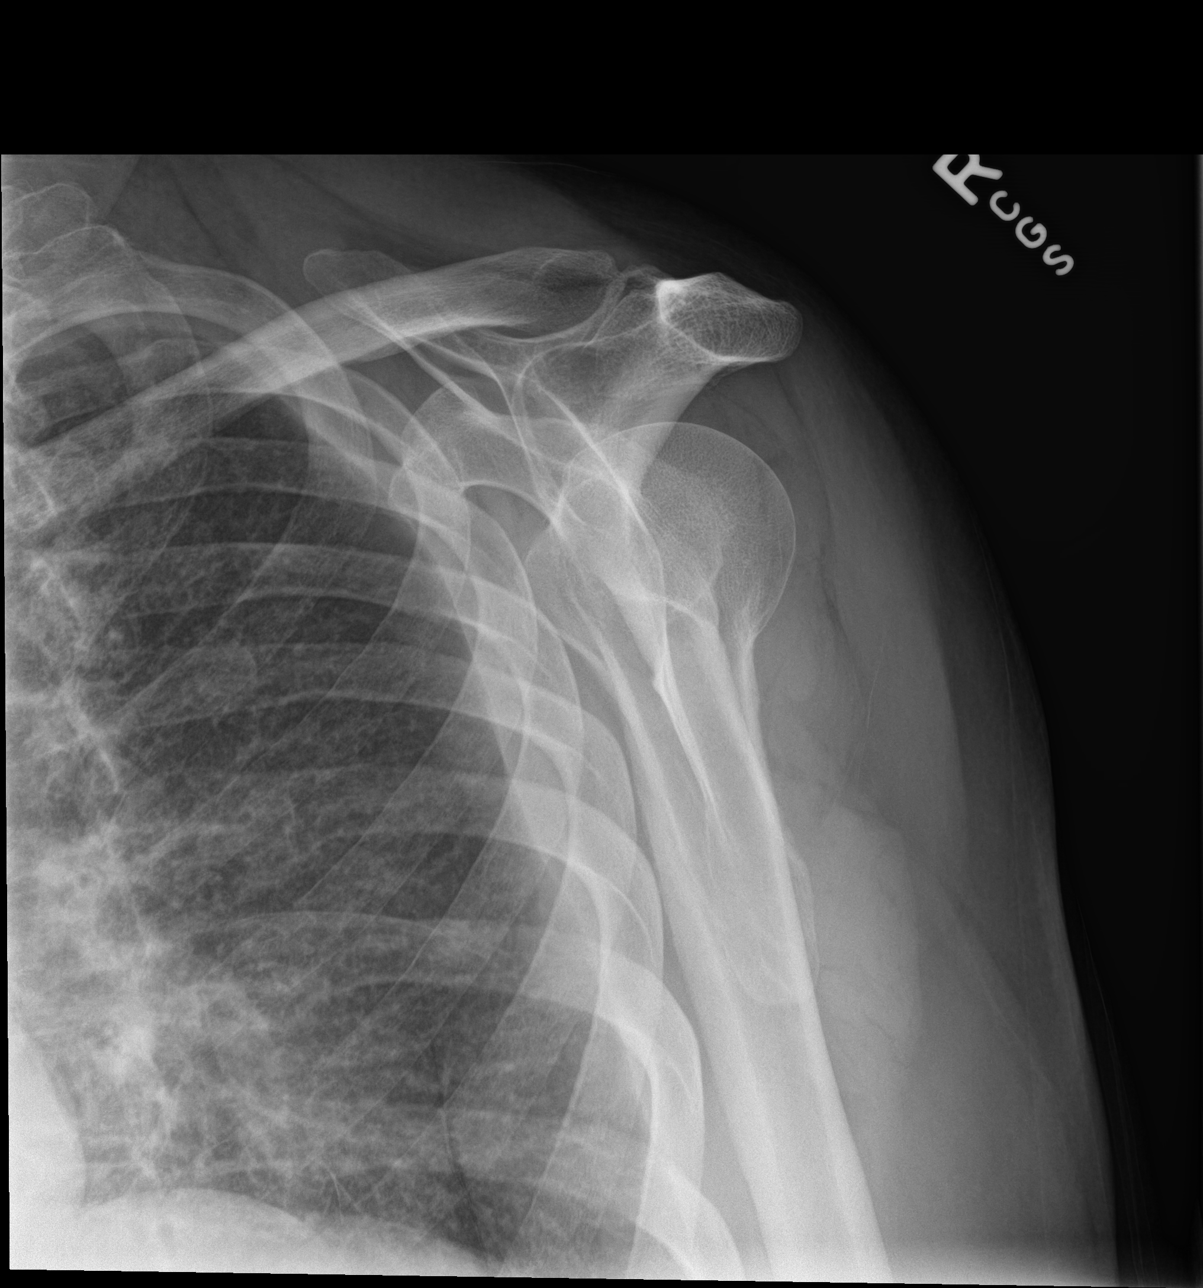

[shoulder axial]
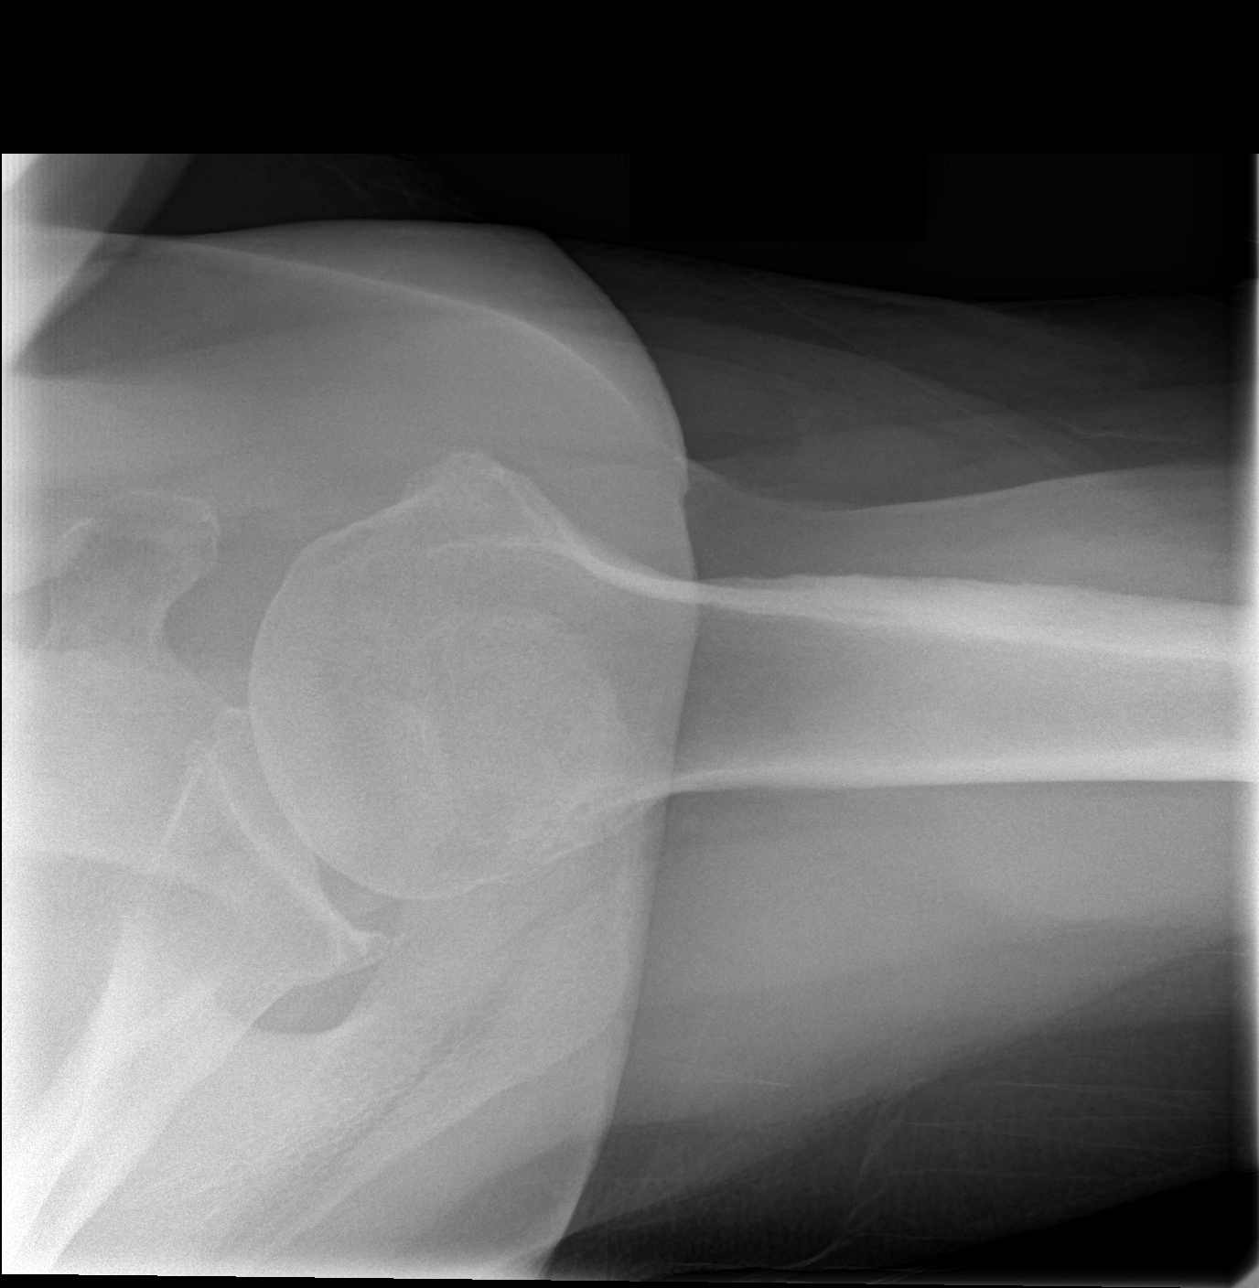

[shoulder grashey]
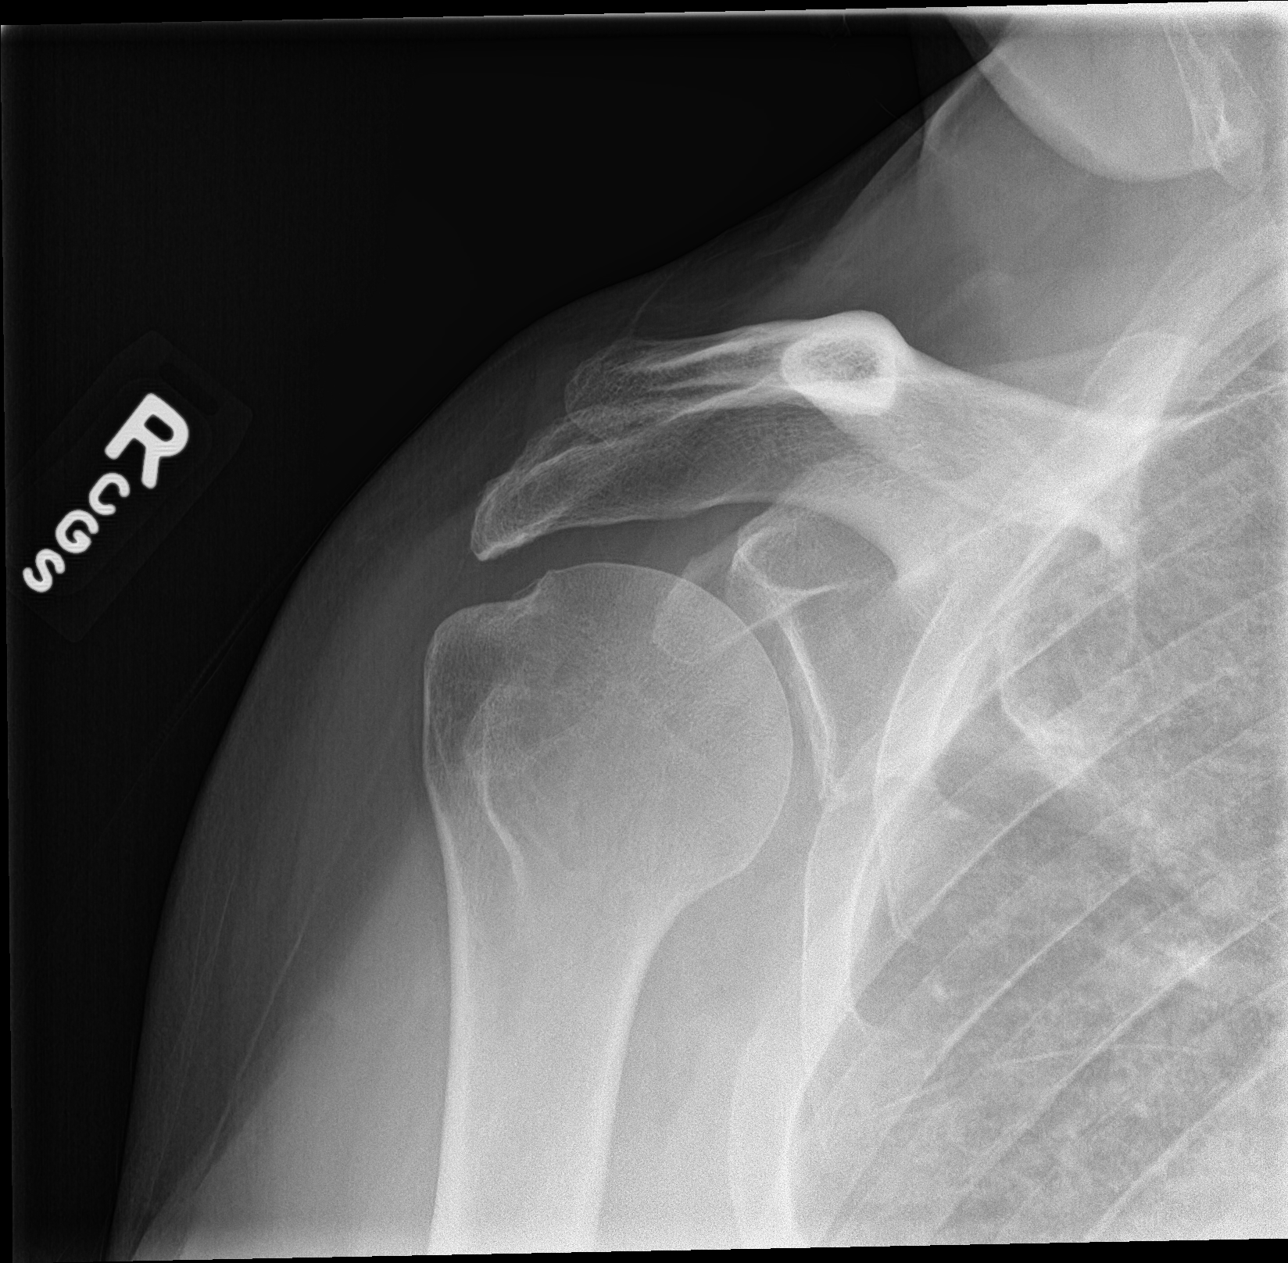

[3 of 3 positions shown; findings below may reference images not displayed]

FINDINGS: There is no evidence of fracture or dislocation. There is no
evidence of arthropathy or other focal bone abnormality. Soft
tissues are unremarkable.
IMPRESSION: Negative.

## 2019-04-26 ENCOUNTER — Telehealth: Payer: Self-pay | Admitting: *Deleted

## 2019-04-26 NOTE — Telephone Encounter (Signed)
LMOVM requesting call back to DC. Direct number given.  Merlin alert received 04/26/19 at 02:00 for 3 treated VT episodes in VF zone on 04/20/19. All 3 events terminated with ATP x1 during charge, shock aborted. 2 NSVT episodes also noted. BiVP 81% since 03/14/19, known frequent PVCs.

## 2019-04-26 NOTE — Telephone Encounter (Signed)
Pt returning Kevin Pearson phone call 

## 2019-04-26 NOTE — Telephone Encounter (Signed)
Patient returned call. Reports he doesn't recall any symptoms with ATP-treated episodes on 10/15, has a lot of anxiety, recently broke his leg. In [place] for the winter so he just setup his monitor. Advised of [place] DMV driving restrictions x6 months, pt states "I'm in [place] now, [place] laws can go to hell." Reiterated purpose of driving restrictions for safety of pt and others, pt verbalizes understanding, states "we have no issue here." Pt reports compliance with cardiac medications, including metoprolol succinate 25mg  daily. Advised I will forward information to Dr. Caryl Comes for recommendations, pt requests call back even if no changes. Pt thanked me for call, denies additional questions at this time. Next f/u with Dr. Caryl Comes is scheduled for 06/13/19.

## 2019-04-26 NOTE — Telephone Encounter (Signed)
Kevin Pearson 1) lets have him increase meto from25>>50 daily 2) ask him to followup with his MD in [place] if possible, My note says his ICD was implanted from upgrade down there     Thanks SK

## 2019-04-27 ENCOUNTER — Telehealth: Payer: Self-pay | Admitting: Emergency Medicine

## 2019-04-27 DIAGNOSIS — I255 Ischemic cardiomyopathy: Secondary | ICD-10-CM

## 2019-04-27 MED ORDER — METOPROLOL SUCCINATE ER 50 MG PO TB24: 50.0000 mg | ORAL_TABLET | Freq: Every day | ORAL | 3 refills | Status: DC

## 2019-04-27 NOTE — Telephone Encounter (Signed)
Spoke with patient and given orders to increase his metoprolol to 50 mg. Will send to [facility] at p[atient request. Encouraged to f/u with cardiology in [place] due to his extended stay. Shock plan reviewed.

## 2019-04-27 NOTE — Telephone Encounter (Signed)
Patient made aware of recommendations by Jenny Reichmann, RN. See phone note from 04/27/19.

## 2019-05-01 ENCOUNTER — Telehealth: Payer: Self-pay | Admitting: Student

## 2019-05-01 NOTE — Telephone Encounter (Signed)
LMOM to call back.   Pt continues to have VT/VF with therapy (See note 10/21 10/22)  Episodes 10/24 646 pm  VF 0:10 seconds -> ATP 10/22 142 pm  VF -:12 seconds -> ATP  ATP on 10/15 once at 550 pm, and twice at 620 pm.  Called to assess symptoms and to further encourage rapid follow up with cardiology down in [place], where he is wintering.   Legrand Como [id number]" [place], PA-C  05/01/2019 10:34 AM

## 2019-05-01 NOTE — Telephone Encounter (Signed)
   Episode from 10/24 below.

## 2019-05-02 NOTE — Telephone Encounter (Signed)
Pt called back.  He has been feeling his USOH. He denies palpitations, lightheadedness, dizziness, syncope, or near syncope.   He increased his Toprol as ordered, but only just picked it up in 10/24.  He has not yet made follow up with his cardiologist. Encouraged to do so soon. He verbalized understanding, and will let us know if his symptoms change.   Legrand Como [address]" [place], PA-C  05/02/2019 8:26 AM

## 2019-05-04 ENCOUNTER — Ambulatory Visit: Payer: Medicare Other | Attending: Gastroenterology

## 2019-05-23 ENCOUNTER — Encounter: Payer: Medicare Other | Admitting: *Deleted

## 2019-05-25 ENCOUNTER — Telehealth: Payer: Self-pay

## 2019-05-25 NOTE — Telephone Encounter (Signed)
Left message for patient to remind of missed remote transmission.  

## 2019-06-13 ENCOUNTER — Encounter: Payer: Medicare Other | Admitting: Internal Medicine

## 2019-06-13 ENCOUNTER — Telehealth: Payer: Self-pay | Admitting: Urology

## 2019-06-13 NOTE — Telephone Encounter (Signed)
Pt's daughter called and states that the pt has been in [place], but is moving back in the next few days. 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 She is trying to make an appt for him, there are no openings until Jan. She would like a call back to discuss.

## 2019-06-13 NOTE — Telephone Encounter (Signed)
Left VM for daughter Moshe Salisbury to return call, can schedule with PA.

## 2019-06-13 NOTE — Telephone Encounter (Signed)
He can see PA for foley removal and resume CIC as needed.    Hollice Espy, MD

## 2019-06-13 NOTE — Telephone Encounter (Signed)
Appt made and confirmed with daughter.

## 2019-06-13 NOTE — Telephone Encounter (Signed)
Spoke with daughter-he has been in a Geriatric psychiatric unit in [place] has fallen twice and left AMA. Has a catheter placed and being treated for UTI. His daughter is picking him up and moving back to [place] this week. Would like to have a follow up next week. Ok to schedule with PA? Or do you need to see him

## 2019-06-21 ENCOUNTER — Ambulatory Visit: Payer: Medicare Other | Admitting: Physician Assistant

## 2019-07-03 ENCOUNTER — Telehealth: Payer: Self-pay

## 2019-07-03 NOTE — Telephone Encounter (Signed)
Patient pharmacy faxed prescription stating- "Patient was released from hospital and put back on Lasix. Attached is discharge papers. May we have a script?"   I do not see where patient is on Lasix.   The discharge paper likes like its from [facility]

## 2019-07-04 ENCOUNTER — Ambulatory Visit: Payer: Medicare Other | Admitting: Family
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
## 2019-07-04 NOTE — Telephone Encounter (Addendum)
Left voicemail message with updated appointment information for next Tuesday with instructions to call back if any further questions.

## 2019-07-05 ENCOUNTER — Other Ambulatory Visit: Payer: Self-pay | Admitting: Cardiovascular Disease

## 2019-07-05 NOTE — Telephone Encounter (Signed)
LMOVM to verify Pt torsemide dose and instructions.
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
## 2019-07-10 ENCOUNTER — Other Ambulatory Visit: Payer: Self-pay

## 2019-07-10 ENCOUNTER — Encounter: Payer: Self-pay | Admitting: Cardiovascular Disease

## 2019-07-10 ENCOUNTER — Ambulatory Visit (INDEPENDENT_AMBULATORY_CARE_PROVIDER_SITE_OTHER): Payer: Medicare Other | Admitting: Cardiovascular Disease

## 2019-07-10 VITALS — BP 110/62 | HR 76 | Ht 67.5 in | Wt 253.2 lb

## 2019-07-10 DIAGNOSIS — I5022 Chronic systolic (congestive) heart failure: Secondary | ICD-10-CM

## 2019-07-10 DIAGNOSIS — J432 Centrilobular emphysema: Secondary | ICD-10-CM | POA: Diagnosis not present

## 2019-07-10 DIAGNOSIS — I25118 Atherosclerotic heart disease of native coronary artery with other forms of angina pectoris: Secondary | ICD-10-CM

## 2019-07-10 DIAGNOSIS — Z95810 Presence of automatic (implantable) cardiac defibrillator: Secondary | ICD-10-CM

## 2019-07-10 DIAGNOSIS — I255 Ischemic cardiomyopathy: Secondary | ICD-10-CM

## 2019-07-10 DIAGNOSIS — I4821 Permanent atrial fibrillation: Secondary | ICD-10-CM

## 2019-07-10 NOTE — Patient Instructions (Signed)

## 2019-07-11 ENCOUNTER — Ambulatory Visit: Payer: Medicare Other | Admitting: Physician Assistant

## 2019-07-12 ENCOUNTER — Other Ambulatory Visit: Payer: Self-pay

## 2019-07-12 ENCOUNTER — Encounter: Payer: Self-pay | Admitting: Urology

## 2019-07-12 ENCOUNTER — Ambulatory Visit (INDEPENDENT_AMBULATORY_CARE_PROVIDER_SITE_OTHER): Payer: Medicare Other | Admitting: Urology

## 2019-07-12 VITALS — BP 142/88 | HR 82 | Ht 67.5 in | Wt 252.0 lb

## 2019-07-12 DIAGNOSIS — Z87440 Personal history of urinary (tract) infections: Secondary | ICD-10-CM | POA: Diagnosis not present

## 2019-07-12 DIAGNOSIS — N320 Bladder-neck obstruction: Secondary | ICD-10-CM

## 2019-07-12 NOTE — Progress Notes (Signed)
07/12/2019 11:35 AM   Camelia Eng September 17, 1950 [id number]  Referring provider: Sharilyn Sites, MD [address] [facility] [place],  [place] [address]  No chief complaint on file.   HPI: 69 year old male with a personal history of bladder neck contracture who returns today for follow-up.  Please see previous notes for details.  He is been taken to the operating room for dilation of bladder neck contracture in the past.  He is now managed with self cath to keep his bladder neck open.  Unfortunately since his last visit, he moved to [place].  While there, he experienced what sounds like a psychotic event and was hospitalized for this.  During the hospitalization, he did require Foley catheter placement which he describes as multiple people with multiple difficult attempts.  He ultimately developed a urinary tract infection which was treated.  His Foley catheter has been subsequently removed.  After this hospitalization, he is returned back to [place] to live closer to his daughter and will be staying here.  Records from [place] reviewed today.  He did have a frankly positive urinalysis during the admission.  In terms of urinary symptoms, he denies any today.  He does have frequency related to his diuretics but otherwise is able to empty.  He has been self cathing once daily with ease.  He has started using alcohol at the tip of his penis to prevent urinary tract infections.  He wonders if this is a good idea.  UA today is completely negative.  He denies any dysuria or gross hematuria.  He is somewhat tangential in his story telling today but at his baseline.   PMH: Past Medical History:  Diagnosis Date  . Acid reflux   . Anxiety   . Arthritis   . Atrial fibrillation (HCC)   . CHF (congestive heart failure) (HCC)   . Chronic orthostatic hypotension   . Clotting disorder (HCC)   . COPD (chronic obstructive pulmonary disease) (HCC)   . Depression   .  Elevated PSA   . Heart attack (HCC)   . Heart disease   . Heart failure (HCC)   . Hepatitis   . High cholesterol   . Hypertension   . Sleep apnea   . TBI (traumatic brain injury) (HCC)   . Urinary retention     Surgical History: Past Surgical History:  Procedure Laterality Date  . BLADDER SURGERY    . cardiac stents    . CYSTOSCOPY WITH URETHRAL DILATATION N/A 06/25/2016   Procedure: CYSTOSCOPY WITH URETHRAL DILATATION;  Surgeon: Vanna Scotland, MD;  Location: [facility] ORS;  Service: Urology;  Laterality: N/A;  . EP IMPLANTABLE DEVICE     St[employer] Jude BiV-ICD  . ESOPHAGOGASTRODUODENOSCOPY (EGD) WITH PROPOFOL N/A 04/06/2019   Procedure: ESOPHAGOGASTRODUODENOSCOPY (EGD) WITH PROPOFOL;  Surgeon: Wyline Mood, MD;  Location: [facility] ENDOSCOPY;  Service: Gastroenterology;  Laterality: N/A;  . GREEN LIGHT LASER TURP (TRANSURETHRAL RESECTION OF PROSTATE  2016   done in [place]   . LEFT HEART CATH AND CORONARY ANGIOGRAPHY Left 10/27/2017   Procedure: LEFT HEART CATH AND CORONARY ANGIOGRAPHY;  Surgeon: Antonieta Iba, MD;  Location: [facility] INVASIVE CV LAB;  Service: Cardiovascular;  Laterality: Left;    Home Medications:  Allergies as of 07/12/2019      Reactions   Mangifera Indica Anaphylaxis   Other Anaphylaxis, Itching, Other (See Comments)   Mango skin   Keflex [cephalexin] Nausea Only   Codeine Nausea And Vomiting      Medication  List       Accurate as of July 12, 2019 11:35 AM. If you have any questions, ask your nurse or doctor.        STOP taking these medications   acamprosate 333 MG tablet Commonly known as: CAMPRAL Stopped by: Hollice Espy, MD   albuterol 108 (90 Base) MCG/ACT inhaler Commonly known as: VENTOLIN HFA Stopped by: Hollice Espy, MD   budesonide 0.25 MG/2ML nebulizer solution Commonly known as: PULMICORT Stopped by: Hollice Espy, MD   clopidogrel 75 MG tablet Commonly known as: PLAVIX Stopped by: Hollice Espy, MD   ipratropium-albuterol 0.5-2.5 (3)  MG/3ML Soln Commonly known as: DUONEB Stopped by: Hollice Espy, MD   isosorbide mononitrate 30 MG 24 hr tablet Commonly known as: IMDUR Stopped by: Hollice Espy, MD   losartan 25 MG tablet Commonly known as: COZAAR Stopped by: Hollice Espy, MD   metoprolol succinate 50 MG 24 hr tablet Commonly known as: TOPROL-XL Stopped by: Hollice Espy, MD   spironolactone 25 MG tablet Commonly known as: ALDACTONE Stopped by: Hollice Espy, MD   T.E.D. Below Knee/X-Large Misc Stopped by: Hollice Espy, MD   Trintellix 5 MG Tabs tablet Generic drug: vortioxetine HBr Stopped by: Hollice Espy, MD     TAKE these medications   acetaminophen 325 MG tablet Commonly known as: TYLENOL Take 650 mg by mouth every 6 (six) hours as needed.   busPIRone 10 MG tablet Commonly known as: BUSPAR Take 10 mg by mouth 3 (three) times daily.   divalproex 500 MG DR tablet Commonly known as: DEPAKOTE Take 750 mg by mouth 2 (two) times daily.   gabapentin 300 MG capsule Commonly known as: NEURONTIN Take 600 mg by mouth 2 (two) times daily.   Melatonin 5 MG Tabs Take 6 mg by mouth at bedtime.   multivitamin tablet Take 1 tablet by mouth daily.   nitroGLYCERIN 0.4 MG SL tablet Commonly known as: NITROSTAT Place 0.4 mg under the tongue every 5 (five) minutes as needed for chest pain.   omeprazole 40 MG capsule Commonly known as: PRILOSEC Take 40 mg by mouth daily.   polyethylene glycol 17 g packet Commonly known as: MIRALAX / GLYCOLAX Take 17 g by mouth 2 (two) times daily.   rosuvastatin 20 MG tablet Commonly known as: CRESTOR Take 10 mg by mouth daily.   senna 8.6 MG tablet Commonly known as: SENOKOT Take 2 tablets by mouth as needed.   tamsulosin 0.4 MG Caps capsule Commonly known as: FLOMAX TAKE ONE CAPSULE BY MOUTH EVERY DAY AFTER SUPPER   torsemide 20 MG tablet Commonly known as: DEMADEX TAKE 2 TABLETS BY MOUTH TWICE A DAY   traZODone 50 MG tablet Commonly known  as: DESYREL Take 100 mg by mouth at bedtime.   Xarelto 20 MG Tabs tablet Generic drug: rivaroxaban Take 20 mg by mouth at bedtime.       Allergies:  Allergies  Allergen Reactions  . Mangifera Indica Anaphylaxis  . Other Anaphylaxis, Itching and Other (See Comments)    Mango skin  . Keflex [Cephalexin] Nausea Only  . Codeine Nausea And Vomiting    Family History: Family History  Problem Relation Age of Onset  . Other Father        Cerebral hemorrhage  . Kidney cancer Neg Hx   . Kidney disease Neg Hx   . Prostate cancer Neg Hx     Social History:  reports that he quit smoking about 3 years ago. His smoking use included cigarettes. He  has a 40.00 pack-year smoking history. He has never used smokeless tobacco. He reports previous alcohol use. He reports that he does not use drugs.  ROS: UROLOGY Frequent Urination?: Yes Hard to postpone urination?: No Burning/pain with urination?: No Get up at night to urinate?: Yes Leakage of urine?: Yes Urine stream starts and stops?: No Trouble starting stream?: No Do you have to strain to urinate?: No Blood in urine?: No Urinary tract infection?: Yes Sexually transmitted disease?: No Injury to kidneys or bladder?: No Painful intercourse?: No Weak stream?: No Erection problems?: No Penile pain?: No  Gastrointestinal Nausea?: No Vomiting?: No Indigestion/heartburn?: No Diarrhea?: No Constipation?: No  Constitutional Fever: No Night sweats?: No Weight loss?: No Fatigue?: No  Skin Skin rash/lesions?: No Itching?: No  Eyes Blurred vision?: Yes Double vision?: No  Ears/Nose/Throat Sore throat?: No Sinus problems?: No  Hematologic/Lymphatic Swollen glands?: No  Cardiovascular Leg swelling?: No Chest pain?: No  Respiratory Cough?: No Shortness of breath?: No  Endocrine Excessive thirst?: No  Musculoskeletal Back pain?: No Joint pain?: No  Neurological Headaches?: No Dizziness?:  Yes  Psychologic Depression?: No Anxiety?: No  Physical Exam: BP (!) 142/88   Pulse 82   Ht 5' 7.5" (1.715 m)   Wt 252 lb (114.3 kg)   BMI 38.89 kg/m   Constitutional:  Alert and oriented, No acute distress. HEENT: [place] AT, moist mucus membranes.  Trachea midline, no masses. Cardiovascular: No clubbing, cyanosis, or edema. Respiratory: Normal respiratory effort, no increased work of breathing. Skin: No rashes, bruises or suspicious lesions. Neurologic: Grossly intact, no focal deficits, moving all 4 extremities. Psychiatric: Normal mood and affect.  Laboratory Data: Lab Results  Component Value Date   WBC 9.0 11/17/2017   HGB 13.0 11/17/2017   HCT 37.6 (L) 11/17/2017   MCV 88.5 11/17/2017   PLT 225 11/17/2017    Lab Results  Component Value Date   CREATININE 1.38 (H) 01/24/2019    Urinalysis UA today reviewed, negative.  See epic.  Assessment & Plan:    1. Bladder neck contracture Chronic/severe  Managed by self cath to keep bladder neck open  I have advised him he may cut back to every other day if cathing is going easily to which he is agreeable  Reviewed sterile technique, advised to use Betadine scent of alcohol given that its mucous membrane surface  Plan for follow-up in September as scheduled for annual cystoscopic evaluation given self cath  2. History of UTI Treated for urinary tract infection while in [place], presumably catheter related  UA today is negative, no further intervention   F/u as scheduled  Vanna Scotland, MD  [facility] [address] [place], [place] [address] [phone]

## 2019-07-12 NOTE — Addendum Note (Signed)
Addended by: Milas Kocher A on: 07/12/2019 12:06 PM   Modules accepted: Orders

## 2019-07-13 LAB — URINALYSIS, COMPLETE
Bilirubin, UA: NEGATIVE
Glucose, UA: NEGATIVE
Ketones, UA: NEGATIVE
Leukocytes,UA: NEGATIVE
Nitrite, UA: NEGATIVE
Protein,UA: NEGATIVE
RBC, UA: NEGATIVE
Specific Gravity, UA: 1.02 (ref 1.005–1.030)
Urobilinogen, Ur: 0.2 mg/dL (ref 0.2–1.0)
pH, UA: 7 (ref 5.0–7.5)

## 2019-07-13 LAB — MICROSCOPIC EXAMINATION
Bacteria, UA: NONE SEEN
RBC, Urine: NONE SEEN /hpf (ref 0–2)

## 2019-07-21 ENCOUNTER — Ambulatory Visit: Payer: Medicare Other | Attending: Internal Medicine

## 2019-07-21 DIAGNOSIS — Z20822 Contact with and (suspected) exposure to covid-19: Secondary | ICD-10-CM

## 2019-07-22 LAB — NOVEL CORONAVIRUS, NAA: SARS-CoV-2, NAA: NOT DETECTED

## 2019-07-30 ENCOUNTER — Other Ambulatory Visit: Payer: Self-pay | Admitting: Internal Medicine

## 2019-08-01 NOTE — Telephone Encounter (Signed)
Pharmacy is requesting a refill on metoprolol. This medication was D/C by pt's urologist, but when I called the pt, he stated that he still takes this medication and would like for Dr. Mariah Milling to put this medication back on his medication list. Please address

## 2019-08-04 ENCOUNTER — Encounter: Payer: Self-pay | Admitting: Gastroenterology

## 2019-08-04 ENCOUNTER — Other Ambulatory Visit: Payer: Self-pay

## 2019-08-04 ENCOUNTER — Ambulatory Visit (INDEPENDENT_AMBULATORY_CARE_PROVIDER_SITE_OTHER): Payer: Medicare Other | Admitting: Gastroenterology

## 2019-08-04 DIAGNOSIS — R1310 Dysphagia, unspecified: Secondary | ICD-10-CM

## 2019-08-04 NOTE — Progress Notes (Signed)
Kevin Pearson , MD [address]  [place]  Kevin Pearson, Kevin Pearson  Main: [phone]  Fax: [phone]   Primary Care Physician: Marygrace Drought, MD  Virtual Visit via Telephone Note  I connected with patient on 08/04/19 at  9:30 AM EST by telephone and verified that I am speaking with the correct person using two identifiers.   I discussed the limitations, risks, security and privacy concerns of performing an evaluation and management service by telephone and the availability of in person appointments. I also discussed with the patient that there may be a patient responsible charge related to this service. The patient expressed understanding and agreed to proceed.  Location of Patient: Home Location of Provider: Home Persons involved: Patient and provider only   History of Present Illness:   Dysphagia follow up   HPI: Kevin Pearson is a 69 y.o. male    I last performed an EGD in 04/2019 for dysphagia and ran a balloon dilator all the way through his esophagus dilated to 1 F, no clear stricture was noted. He has a hiatal hernia .  He says did great for 2 months then dysphagia for solids returned, food gets stuck below adams apple, no coughing.   Current Outpatient Medications  Medication Sig Dispense Refill  . acetaminophen (TYLENOL) 325 MG tablet Take 650 mg by mouth every 6 (six) hours as needed.     . busPIRone (BUSPAR) 10 MG tablet Take 10 mg by mouth 3 (three) times daily.    . divalproex (DEPAKOTE) 500 MG DR tablet Take 750 mg by mouth 2 (two) times daily.     Marland Kitchen gabapentin (NEURONTIN) 300 MG capsule Take 600 mg by mouth 2 (two) times daily.     . Melatonin 5 MG TABS Take 6 mg by mouth at bedtime.     . metoprolol succinate (TOPROL-XL) 50 MG 24 hr tablet TAKE 1 TABLET BY MOUTH EVERY DAY WITH OR IMMEDIATELY AFTER MEAL 90 tablet 3  . Multiple Vitamin (MULTIVITAMIN) tablet Take 1 tablet by mouth daily.    . nitroGLYCERIN (NITROSTAT) 0.4 MG SL tablet Place 0.4  mg under the tongue every 5 (five) minutes as needed for chest pain.    Marland Kitchen omeprazole (PRILOSEC) 40 MG capsule Take 40 mg by mouth daily.    . polyethylene glycol (MIRALAX / GLYCOLAX) 17 g packet Take 17 g by mouth 2 (two) times daily.    . rivaroxaban (XARELTO) 20 MG TABS tablet Take 20 mg by mouth at bedtime.    . rosuvastatin (CRESTOR) 20 MG tablet Take 10 mg by mouth daily.     Marland Kitchen senna (SENOKOT) 8.6 MG tablet Take 2 tablets by mouth as needed.     . tamsulosin (FLOMAX) 0.4 MG CAPS capsule TAKE ONE CAPSULE BY MOUTH EVERY DAY AFTER SUPPER 30 capsule 11  . torsemide (DEMADEX) 20 MG tablet TAKE 2 TABLETS BY MOUTH TWICE A DAY 360 tablet 0  . traZODone (DESYREL) 50 MG tablet Take 100 mg by mouth at bedtime.     No current facility-administered medications for this visit.    Allergies as of 08/04/2019 - Review Complete 07/12/2019  Allergen Reaction Noted  . 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 Other Anaphylaxis, Itching, and Other (See Comments) 06/24/2016  . Keflex [cephalexin] Nausea Only 03/16/2019  . Codeine Nausea And Vomiting 06/18/2016    Review of Systems:    All systems reviewed and negative except where noted in HPI.   Observations/Objective:  Labs: CMP  Component Value Date/Time   NA 142 01/24/2019 1119   K 4.4 01/24/2019 1119   CL 100 01/24/2019 1119   CO2 21 01/24/2019 1119   GLUCOSE 133 (H) 01/24/2019 1119   GLUCOSE 160 (H) 11/17/2017 1257   BUN 18 01/24/2019 1119   CREATININE 1.38 (H) 01/24/2019 1119   CALCIUM 9.7 01/24/2019 1119   PROT 7.1 01/24/2019 1119   ALBUMIN 4.3 01/24/2019 1119   AST 21 01/24/2019 1119   ALT 19 01/24/2019 1119   ALKPHOS 51 01/24/2019 1119   BILITOT 0.4 01/24/2019 1119   GFRNONAA 53 (L) 01/24/2019 1119   GFRAA 61 01/24/2019 1119   Lab Results  Component Value Date   WBC 9.0 11/17/2017   HGB 13.0 11/17/2017   HCT 37.6 (L) 11/17/2017   MCV 88.5 11/17/2017   PLT 225 11/17/2017    Imaging Studies: No results  found.  Assessment and Plan:   Kevin Pearson is a 69 y.o. y/o male here to follow up for dysphagia for solids.   Plan :   1. Barium swallow with tablet and based on results will discuss next steps   Follow Up Instructions:   F/u in 4 weeks tele visit  I discussed the assessment and treatment plan with the patient. The patient was provided an opportunity to ask questions and all were answered. The patient agreed with the plan and demonstrated an understanding of the instructions.   The patient was advised to call back or seek an in-person evaluation if the symptoms worsen or if the condition fails to improve as anticipated.  I provided 12 minutes of non-face-to-face time during this encounter.  Dr Wyline Mood MD,MRCP [facility]) Gastroenterology/Hepatology Pager: [phone]   Speech recognition software was used to dictate this note.

## 2019-08-10 ENCOUNTER — Other Ambulatory Visit: Payer: Self-pay

## 2019-08-10 ENCOUNTER — Other Ambulatory Visit: Payer: Self-pay | Admitting: Gastroenterology

## 2019-08-10 ENCOUNTER — Ambulatory Visit
Admission: RE | Admit: 2019-08-10 | Discharge: 2019-08-10 | Disposition: A | Discharge status: Home / Self Care | Payer: Medicare Other | Source: Ambulatory Visit | Attending: Gastroenterology | Admitting: Gastroenterology

## 2019-08-10 DIAGNOSIS — R1310 Dysphagia, unspecified: Secondary | ICD-10-CM

## 2019-08-11 ENCOUNTER — Ambulatory Visit (INDEPENDENT_AMBULATORY_CARE_PROVIDER_SITE_OTHER): Payer: Medicare Other | Admitting: *Deleted

## 2019-08-11 DIAGNOSIS — I255 Ischemic cardiomyopathy: Secondary | ICD-10-CM

## 2019-08-12 LAB — CUP PACEART REMOTE DEVICE CHECK
Battery Remaining Longevity: 24 mo
Battery Remaining Percentage: 28 %
Battery Voltage: 2.86 V
Date Time Interrogation Session: 20210206020017
HighPow Impedance: 64 Ohm
HighPow Impedance: 64 Ohm
Implantable Lead Implant Date: 20131003
Implantable Lead Implant Date: 20131003
Implantable Lead Location: 753858
Implantable Lead Location: 753860
Implantable Pulse Generator Implant Date: 20131003
Lead Channel Impedance Value: 390 Ohm
Lead Channel Impedance Value: 890 Ohm
Lead Channel Pacing Threshold Amplitude: 0.625 V
Lead Channel Pacing Threshold Amplitude: 1.125 V
Lead Channel Pacing Threshold Pulse Width: 0.5 ms
Lead Channel Pacing Threshold Pulse Width: 0.6 ms
Lead Channel Sensing Intrinsic Amplitude: 8.9 mV
Lead Channel Setting Pacing Amplitude: 2 V
Lead Channel Setting Pacing Amplitude: 2.125
Lead Channel Setting Pacing Pulse Width: 0.5 ms
Lead Channel Setting Pacing Pulse Width: 0.6 ms
Lead Channel Setting Sensing Sensitivity: 0.5 mV
Pulse Gen Serial Number: 7055215

## 2019-08-30 ENCOUNTER — Telehealth: Payer: Self-pay | Admitting: *Deleted

## 2019-08-30 DIAGNOSIS — I472 Ventricular tachycardia, unspecified: Secondary | ICD-10-CM

## 2019-08-30 NOTE — Telephone Encounter (Addendum)
Merlin alert received for VT-1 monitor zone episode on 08/29/19 at 11:27, 10sec duration. EGM appears 25 beats NSVT, CL 332-430ms. 1 NSVT episode also noted, 14 beats in VT-2 zone.  Spoke with patient. He denies any symptoms with episode. Overdue for 3 month f/u with Dr. Graciela Husbands. Patient agreeable to appointment on 10/17/19, no additional questions at this time. Attempted to call back to confirm taking Toprol 50mg  daily--no answer, LMOVM requesting call back to DC.  Routed to Dr. for review.

## 2019-08-31 NOTE — Telephone Encounter (Signed)
Spoke with patient to clarify medications. He reports compliance with medications, though he is taking Toprol 25mg  daily instead of 50mg  daily per his pill pack labels. Pt reports his BP runs 113-120s/70-80s at home, which he feels is "pretty low." Advised will forward information to Dr. as , will clarify if patient should still increase Toprol to 50mg  daily. Pt is in agreement with plan, no further questions at this time.  [facility], confirmed they only have a prescription for Toprol 25mg  on file.

## 2019-08-31 NOTE — Telephone Encounter (Signed)
Follow up  ° ° °Pt is returning call  ° ° °Please call back  °

## 2019-09-05 ENCOUNTER — Ambulatory Visit (INDEPENDENT_AMBULATORY_CARE_PROVIDER_SITE_OTHER): Payer: Medicare Other | Admitting: Gastroenterology

## 2019-09-05 DIAGNOSIS — R1310 Dysphagia, unspecified: Secondary | ICD-10-CM

## 2019-09-05 NOTE — Telephone Encounter (Signed)
Would continue as noted,  INcrease metoprolol if he will  Thanks SK

## 2019-09-05 NOTE — Progress Notes (Signed)
Wyline Mood , MD [address]  [address]  [place], [place] [address]  Main: [phone]  Fax: [phone]   Primary Care Physician: Sharilyn Sites, MD  Virtual Visit via Telephone Note  I connected with patient on 09/05/19 at  1:45 PM EST by telephone and verified that I am speaking with the correct person using two identifiers.   I discussed the limitations, risks, security and privacy concerns of performing an evaluation and management service by telephone and the availability of in person appointments. I also discussed with the patient that there may be a patient responsible charge related to this service. The patient expressed understanding and agreed to proceed.  Location of Patient: Home Location of Provider: Home Persons involved: Patient and provider only   History of Present Illness: No chief complaint on file.   HPI: Kevin Pearson is a 69 y.o. male    Summary of history :  I last performed an EGD in 04/2019 for dysphagia and ran a balloon dilator all the way through his esophagus dilated to 53 F, no clear stricture was noted. He has a hiatal hernia .  He says did great for 2 months then dysphagia for solids returned, food gets stuck below adams apple, no coughing. Was seen for the same on 08/04/2019  Interval history   08/04/2019-09/05/2019  08/10/2019: Barium swallow.  Cervical esophagus is normal.  Mild laryngeal penetration may be present.  Thoracic esophagus is widely patent no evidence of stricture or focal obstruction.  Small hiatal hernia with mild reflux noted.  He does mention that there is no issue with the food going down and has said that very often when he swallows it causes him to cough.  He does recollect that he has had multiple episodes of chest infections and he wonders if it was due to aspiration. Current Outpatient Medications  Medication Sig Dispense Refill  . acetaminophen (TYLENOL) 325 MG tablet Take 650 mg by mouth every 6 (six) hours  as needed.     . busPIRone (BUSPAR) 10 MG tablet Take 10 mg by mouth 3 (three) times daily.    . divalproex (DEPAKOTE) 500 MG DR tablet Take 750 mg by mouth 2 (two) times daily.     Marland Kitchen gabapentin (NEURONTIN) 300 MG capsule Take 600 mg by mouth 2 (two) times daily.     . Melatonin 5 MG TABS Take 6 mg by mouth at bedtime.     . metoprolol succinate (TOPROL-XL) 50 MG 24 hr tablet TAKE 1 TABLET BY MOUTH EVERY DAY WITH OR IMMEDIATELY AFTER MEAL (Patient taking differently: Take 25 mg by mouth daily. ) 90 tablet 3  . Multiple Vitamin (MULTIVITAMIN) tablet Take 1 tablet by mouth daily.    . nitroGLYCERIN (NITROSTAT) 0.4 MG SL tablet Place 0.4 mg under the tongue every 5 (five) minutes as needed for chest pain.    Marland Kitchen omeprazole (PRILOSEC) 40 MG capsule Take 40 mg by mouth daily.    . rivaroxaban (XARELTO) 20 MG TABS tablet Take 20 mg by mouth at bedtime.    . rosuvastatin (CRESTOR) 20 MG tablet Take 10 mg by mouth daily.     Marland Kitchen senna (SENOKOT) 8.6 MG tablet Take 2 tablets by mouth as needed.     . tamsulosin (FLOMAX) 0.4 MG CAPS capsule TAKE ONE CAPSULE BY MOUTH EVERY DAY AFTER SUPPER 30 capsule 11  . torsemide (DEMADEX) 20 MG tablet TAKE 2 TABLETS BY MOUTH TWICE A DAY 360 tablet 0  . traZODone (  DESYREL) 50 MG tablet Take 100 mg by mouth at bedtime.     No current facility-administered medications for this visit.    Allergies as of 09/05/2019 - Review Complete 08/04/2019  Allergen Reaction Noted  . 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 Other Anaphylaxis, Itching, and Other (See Comments) 06/24/2016  . Keflex [cephalexin] Nausea Only 03/16/2019  . Codeine Nausea And Vomiting 06/18/2016    Review of Systems:    All systems reviewed and negative except where noted in HPI.   Observations/Objective:  Labs: CMP     Component Value Date/Time   NA 142 01/24/2019 1119   K 4.4 01/24/2019 1119   CL 100 01/24/2019 1119   CO2 21 01/24/2019 1119   GLUCOSE 133 (H) 01/24/2019 1119   GLUCOSE 160  (H) 11/17/2017 1257   BUN 18 01/24/2019 1119   CREATININE 1.38 (H) 01/24/2019 1119   CALCIUM 9.7 01/24/2019 1119   PROT 7.1 01/24/2019 1119   ALBUMIN 4.3 01/24/2019 1119   AST 21 01/24/2019 1119   ALT 19 01/24/2019 1119   ALKPHOS 51 01/24/2019 1119   BILITOT 0.4 01/24/2019 1119   GFRNONAA 53 (L) 01/24/2019 1119   GFRAA 61 01/24/2019 1119   Lab Results  Component Value Date   WBC 9.0 11/17/2017   HGB 13.0 11/17/2017   HCT 37.6 (L) 11/17/2017   MCV 88.5 11/17/2017   PLT 225 11/17/2017    Imaging Studies: CUP PACEART REMOTE DEVICE CHECK  Result Date: 08/12/2019 Scheduled remote reviewed.  Normal device function.  1 15 NSVT episodes 180-220bpm. Next remote 91 days.  DG ESOPHAGUS W DOUBLE CM (HD)  Result Date: 08/10/2019 CLINICAL DATA:  Dysphagia. History of esophageal stretching 5 months ago. EXAM: ESOPHOGRAM / BARIUM SWALLOW / BARIUM TABLET STUDY TECHNIQUE: Combined double contrast and single contrast examination performed using effervescent crystals, thick barium liquid, and thin barium liquid. The patient was observed with fluoroscopy swallowing a 13 mm barium sulphate tablet. FLUOROSCOPY TIME:  Fluoroscopy Time:  1 minutes 36 seconds Radiation Exposure Index (if provided by the fluoroscopic device): 52.9 mGy scratch COMPARISON:  Chest x-ray 11/17/2017.  CT 12/23/2016. FINDINGS: Cardiac pacer noted. Mild laryngeal penetration may be present. Cervical esophagus otherwise appears normal. Thoracic esophagus is widely patent. No evidence of stricture or focal obstructing abnormality. Small hiatal hernia with mild gastroesophageal reflux. Normal peristalsis. Barium tablet passed easily. IMPRESSION: 1. Mild laryngeal penetration may be present. Cervical esophagus is otherwise normal. 2. Thoracic esophagus is widely patent. No evidence of stricture or focal obstructing abnormality. Small hiatal hernia with mild gastroesophageal reflux. Electronically Signed   By: Maisie Fus  Register   On: 08/10/2019  11:41    Assessment and Plan:   Kevin Pearson is a 69 y.o. y/o male here to follow up for dysphagia for solids.   Barium swallow did not show any clear strictures.  There was some laryngeal penetration of the barium and a hiatal hernia with mild reflux was noted.  Transfer dysphagia cannot be ruled out.  There is definitely no obstruction seen.  His history is suggestive of transfer dysphagia as he has had multiple chest infections.  Plan :   1. Modified barium swallow.   Follow-up in 2 to 3 months telephone visit I discussed the assessment and treatment plan with the patient. The patient was provided an opportunity to ask questions and all were answered. The patient agreed with the plan and demonstrated an understanding of the instructions.   The patient was advised to call back or seek  an in-person evaluation if the symptoms worsen or if the condition fails to improve as anticipated.  I provided 15 minutes of non-face-to-face time during this encounter.  Dr Jonathon Bellows MD,MRCP [facility]) Gastroenterology/Hepatology Pager: [phone]   Speech recognition software was used to dictate this note.

## 2019-09-06 ENCOUNTER — Other Ambulatory Visit: Payer: Self-pay

## 2019-09-06 DIAGNOSIS — R1310 Dysphagia, unspecified: Secondary | ICD-10-CM

## 2019-09-06 MED ORDER — METOPROLOL SUCCINATE ER 50 MG PO TB24: ORAL_TABLET | ORAL | 3 refills | Status: DC

## 2019-09-06 NOTE — Telephone Encounter (Signed)
Spoke with patient. He agrees to increase Toprol to 50mg  daily. Pt requests that I contact his pharmacy as he just received 2 weeks of medications in bubble packs and is not sure how they will manage the increased dose.  Prescription sent electronically to pharmacy.  Spoke with pharmacy tech at . Drew, Compliance Specialist, is gone for the day. Pharmacy staff is unsure if he will need pt's pill packs back to exchange Toprol pills, or if he will be able to supply an additional 25mg  pill for this 2 week period. DC number provided, requested call back once this is clarified. Advised to leave message with DC staff for [employer], RN, if calling back tomorrow as I will be out of the office.  Spoke with pt to update on plan. Advised pt to hold off on increasing Toprol dose by pulling from future packs until can provide input. Pt verbalizes understanding and agrees to wait. Will follow back up with pt on Friday to confirm he is aware of updated plan.   Reviewed medication instructions. Encouraged pt to contact our office prior to his 10/17/19 OV with Dr. Friday if he experiences increased dizziness, hypotension, or fatigue with increased Toprol dose. Pt verbalizes understanding and denies additional questions or concerns at this time.

## 2019-09-07 ENCOUNTER — Encounter: Payer: Self-pay | Admitting: Gastroenterology

## 2019-09-07 ENCOUNTER — Telehealth: Payer: Self-pay

## 2019-09-07 NOTE — Telephone Encounter (Signed)
Called pt to schedule Modified Barium swallow ordered by Dr. Tobi Bastos.  Unable to contact, LVM to return call

## 2019-09-08 NOTE — Telephone Encounter (Signed)
Confirmed with Kenard Gower at pharmacy that additional Toprol 25mg  tablets were delivered to pt earlier this week to supplement pill packs for this current supply. Will be updated to 50mg  for next round of pill packs.  Spoke with pt, who confirmed that he received additional tablets from pharmacy and instructions on how to take them. He verbalizes appreciation of assistance and denies additional questions or concerns at this time.

## 2019-09-12 NOTE — Telephone Encounter (Signed)
Spoke with pt. Modified barium swallow has been scheduled.

## 2019-09-14 ENCOUNTER — Other Ambulatory Visit: Payer: Self-pay | Admitting: Internal Medicine

## 2019-09-14 MED ORDER — ALBUTEROL SULFATE HFA 108 (90 BASE) MCG/ACT IN AERS: 2.0000 | INHALATION_SPRAY | Freq: Four times a day (QID) | RESPIRATORY_TRACT | 1 refills | Status: AC | PRN

## 2019-09-14 NOTE — Telephone Encounter (Signed)
Rx for albuterol HFA has been sent to preferred pharmacy. Pt is aware and voiced his understanding.  Nothing further is needed.

## 2019-09-18 ENCOUNTER — Other Ambulatory Visit: Payer: Self-pay

## 2019-09-18 ENCOUNTER — Ambulatory Visit
Admission: RE | Admit: 2019-09-18 | Discharge: 2019-09-18 | Disposition: A | Discharge status: Home / Self Care | Payer: Medicare Other | Source: Ambulatory Visit | Attending: Gastroenterology | Admitting: Gastroenterology

## 2019-09-18 DIAGNOSIS — R1312 Dysphagia, oropharyngeal phase: Secondary | ICD-10-CM | POA: Insufficient documentation

## 2019-09-18 NOTE — Therapy (Signed)
[facility] DI[facility] [address] [place], [place], [address] Phone: [phone]   Fax:     Modified Barium Swallow  Patient Details  Name: Kevin Pearson MRN: [id number] Date of Birth: 05-Sep-1950 No data recorded  Encounter Date: 09/18/2019  End of Session - 09/18/19 1320    Visit Number  1    Number of Visits  1    Date for SLP Re-Evaluation  09/18/19    SLP Start Time  1235    SLP Stop Time   1320    SLP Time Calculation (min)  45 min    Activity Tolerance  Patient tolerated treatment well       Past Medical History:  Diagnosis Date  . Acid reflux   . Anxiety   . Arthritis   . Atrial fibrillation (HCC)   . CHF (congestive heart failure) (HCC)   . Chronic orthostatic hypotension   . Clotting disorder (HCC)   . COPD (chronic obstructive pulmonary disease) (HCC)   . Depression   . Elevated PSA   . Heart attack (HCC)   . Heart disease   . Heart failure (HCC)   . Hepatitis   . High cholesterol   . Hypertension   . Sleep apnea   . TBI (traumatic brain injury) (HCC)   . Urinary retention     Past Surgical History:  Procedure Laterality Date  . BLADDER SURGERY    . cardiac stents    . CYSTOSCOPY WITH URETHRAL DILATATION N/A 06/25/2016   Procedure: CYSTOSCOPY WITH URETHRAL DILATATION;  Surgeon: Vanna Scotland, MD;  Location: [facility] ORS;  Service: Urology;  Laterality: N/A;  . EP IMPLANTABLE DEVICE     St. Jude BiV-ICD  . ESOPHAGOGASTRODUODENOSCOPY (EGD) WITH PROPOFOL N/A 04/06/2019   Procedure: ESOPHAGOGASTRODUODENOSCOPY (EGD) WITH PROPOFOL;  Surgeon: Wyline Mood, MD;  Location: [facility] ENDOSCOPY;  Service: Gastroenterology;  Laterality: N/A;  . GREEN LIGHT LASER TURP (TRANSURETHRAL RESECTION OF PROSTATE  2016   done in [place]   . LEFT HEART CATH AND CORONARY ANGIOGRAPHY Left 10/27/2017   Procedure: LEFT HEART CATH AND CORONARY ANGIOGRAPHY;  Surgeon: Antonieta Iba, MD;  Location: [facility] INVASIVE CV LAB;  Service:  Cardiovascular;  Laterality: Left;    There were no vitals filed for this visit.     Subjective: Patient behavior: (alertness, ability to follow instructions, etc.):  The patient is alert, confused, able to follow simple directions.  Chief complaint: Patient reports multiple chest infections and is concerned that he may be aspirating.    Objective:  Radiological Procedure: A videoflouroscopic evaluation of oral-preparatory, reflex initiation, and pharyngeal phases of the swallow was performed; as well as a screening of the upper esophageal phase.  I. POSTURE: Upright in MBS chair  II. VIEW: Lateral  III. COMPENSATORY STRATEGIES: N/A  IV. BOLUSES ADMINISTERED:   Thin Liquid: 1 small, 3 rapid consecutive   Nectar-thick Liquid: 1 moderate   Honey-thick Liquid: DNT   Puree: 1 teaspoon, 1 heaping teaspoon   Mechanical Soft: 1/4 graham cracker in applesauce  V. RESULTS OF EVALUATION: A. ORAL PREPARATORY PHASE: (The lips, tongue, and velum are observed for strength and coordination)       **Overall Severity Rating: within normal limits   B. SWALLOW INITIATION/REFLEX: (The reflex is normal if "triggered" by the time the bolus reached the base of the tongue)  **Overall Severity Rating: Mild-moderate; triggers while falling from the valleculae to the pyriform sinuses  C. PHARYNGEAL PHASE: (Pharyngeal function is normal if  the bolus shows rapid, smooth, and continuous transit through the pharynx and there is no pharyngeal residue after the swallow)  **Overall Severity Rating: Mild; reduced tongue base retraction, trace-to-mild pharyngeal residue  D. LARYNGEAL PENETRATION: (Material entering into the laryngeal inlet/vestibule but not aspirated) None  E. ASPIRATION: None  F. ESOPHAGEAL PHASE: (Screening of the upper esophagus) Bolus stasis within the mid-esophagus without retrograde movement  ASSESSMENT: This 69 year old man; with concern for prandial aspiration; is presenting  with mild oropharyngeal dysphagia characterized by delayed pharyngeal swallow initiation, reduced pharyngeal pressure generation, and mild pharyngeal residue.  Oral control of the bolus including oral hold, rotary mastication, and anterior to posterior transfer is within functional limits.   There is no observed laryngeal penetration or tracheal aspiration.  The patient is not at risk for prandial aspiration.  An esophageal sweep in the upright position with liquid and solid consistencies showed mid-esophageal retention without retrograde flow below the level of the pharyngoesophageal segment.   Delayed pharyngeal swallow initiation is consistent with effects of laryngopharyngeal reflux (inflammation, edema, and resultant decreased sensation of the larynx and pharynx).  The patient was assured that his swallowing is safe, from an aspiration consideration.   PLAN/RECOMMENDATIONS:   A. Diet: Regular   B. Swallowing Precautions: No special precautions indicated for oropharyngeal swallowing   C. Recommended consultation to: follow up with MDs as recommended   D. Therapy recommendations: speech therapy is not indicated   E. Results and recommendations were discussed with the patient immediately following the study and the final report routed to the referring MD.   Oropharyngeal dysphagia - Plan: DG SWALLOW FUNC OP MEDICARE SPEECH PATH, DG SWALLOW FUNC OP MEDICARE SPEECH PATH        Problem List Patient Active Problem List   Diagnosis Date Noted  . Positive cardiac stress test 10/21/2017  . Unstable angina ([place]) 10/21/2017  . Hyperlipidemia 10/04/2017  . Ischemic cardiomyopathy 10/04/2017  . 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 Lower extremity edema 10/04/2017  . Acute on chronic heart failure ([place]) 09/21/2017  . Lymphedema 04/14/2017  . Cellulitis 03/25/2017  . COPD exacerbation ([place]) 03/06/2017  . Hypotension 10/29/2016  .  Obstructive sleep apnea 10/29/2016  . Chronic bilateral low back pain 08/27/2016  . Chronic obstructive pulmonary disease ([place]) 07/16/2016  . Chronic systolic congestive heart failure ([employer]) 07/16/2016  . Peripheral neuropathy 07/16/2016  . Anxiety and depression 07/16/2016  . Arthralgia of right foot 07/16/2016  . Urethral stricture 07/16/2016  . Urinary retention 06/24/2016   Leroy Sea, MS/CCC- SLP  Lou Miner 09/18/2019, 1:21 PM  [facility] [place], [place], [address] Phone: [phone]   Fax:     Name: Esvin Hnat MRN: [id number] Date of Birth: 1950-08-17

## 2019-09-25 ENCOUNTER — Telehealth: Payer: Self-pay

## 2019-09-25 DIAGNOSIS — I472 Ventricular tachycardia, unspecified: Secondary | ICD-10-CM

## 2019-09-25 DIAGNOSIS — R110 Nausea: Secondary | ICD-10-CM

## 2019-09-25 NOTE — Telephone Encounter (Signed)
Left voicemail message for pt to contact RN at [phone]. 

## 2019-09-25 NOTE — Telephone Encounter (Signed)
Lets get K and Mg please

## 2019-09-25 NOTE — Telephone Encounter (Signed)
Pt returned phone call, states he has been sick with nausea for the past couple of days.  He denies any cardiac symptoms and is not sure if he had any specific symptoms at time of episode.  He confirms that he is taking metoprolol as ordered with no missed doses.

## 2019-09-25 NOTE — Telephone Encounter (Signed)
Spoke with pt and advised per Dr Graciela Husbands pt will need to have labs drawn.  Pt states he will go to [facility] site tomorrow am for labwork.  Orders placed.  Pt verbalizes understanding and agrees with current plan.

## 2019-09-25 NOTE — Telephone Encounter (Signed)
ICD Alert received for treated VT episode 09/23/19 1428. Current meds include Metoprolol  50mg  daily.  Pt has upcoming appt. On 4/13 with Dr. 5/13.   Attempted to reach pt to determine symptoms and medications.  No asnwer, left VM on identified VM requesting pt callback to discuss.    Forwarding to MD for review.  Will add pt symptoms when he calls back.

## 2019-09-26 ENCOUNTER — Other Ambulatory Visit
Admission: RE | Admit: 2019-09-26 | Discharge: 2019-09-26 | Disposition: A | Discharge status: Home / Self Care | Payer: Medicare Other | Source: Ambulatory Visit | Attending: Internal Medicine | Admitting: Internal Medicine

## 2019-09-26 ENCOUNTER — Other Ambulatory Visit: Payer: Self-pay

## 2019-09-26 ENCOUNTER — Telehealth: Payer: Self-pay

## 2019-09-26 DIAGNOSIS — R110 Nausea: Secondary | ICD-10-CM | POA: Diagnosis present

## 2019-09-26 DIAGNOSIS — I472 Ventricular tachycardia: Secondary | ICD-10-CM | POA: Diagnosis not present

## 2019-09-26 LAB — MAGNESIUM: Magnesium: 1.9 mg/dL (ref 1.7–2.4)

## 2019-09-26 LAB — POTASSIUM: Potassium: 3.7 mmol/L (ref 3.5–5.1)

## 2019-09-26 NOTE — Telephone Encounter (Signed)
Spoke with pt and informed him of results and Dr. Johnney Killian recommendations. Pt understands and agrees.

## 2019-09-26 NOTE — Telephone Encounter (Signed)
-----   Message from Wyline Mood, MD sent at 09/25/2019  1:31 PM EDT ----- Inform that the only abnormality seen on the modified barium swallow was a degree of reflux.  Suggest to sit up while eating, chew food and swallow it with a few sips of liquid.  Ensure is taking a PPI 30 minutes before breakfast.  Use a wedge pillow at night.  Avoid eating for 2 hours before bedtime.  Discuss further at follow-up visit

## 2019-09-26 NOTE — Telephone Encounter (Signed)
Called pt to inform him of modified barium swallow results and Dr. Johnney Killian recommendations.  Unable to contact, LVM to return call

## 2019-10-05 ENCOUNTER — Telehealth: Payer: Self-pay | Admitting: Emergency Medicine

## 2019-10-05 NOTE — Telephone Encounter (Signed)
Alert received for VF episode that occurred on 10/04/19 at 1829 that was successfully terminated by ATP x 1. Patient was asymptomatic at time of event . Started Toprol Xl 50 mg daily 3 weeks ago and no doses missed. 09/26/19 labs showed magnesium 1.9 and K+ of 3.7. Has follow up with Dr Graciela Husbands 10/17/19.

## 2019-10-09 NOTE — Telephone Encounter (Signed)
Noted    A dirty break  wonder whether it was ATP or spontaneous although there was a change of morphology Thanks SK

## 2019-10-17 ENCOUNTER — Encounter: Payer: Self-pay | Admitting: Internal Medicine

## 2019-10-17 ENCOUNTER — Other Ambulatory Visit: Payer: Self-pay

## 2019-10-17 ENCOUNTER — Ambulatory Visit (INDEPENDENT_AMBULATORY_CARE_PROVIDER_SITE_OTHER): Payer: Medicare Other | Admitting: Internal Medicine

## 2019-10-17 VITALS — BP 150/88 | HR 73 | Ht 67.5 in | Wt 234.4 lb

## 2019-10-17 DIAGNOSIS — I4821 Permanent atrial fibrillation: Secondary | ICD-10-CM | POA: Diagnosis not present

## 2019-10-17 DIAGNOSIS — I472 Ventricular tachycardia, unspecified: Secondary | ICD-10-CM

## 2019-10-17 DIAGNOSIS — I5022 Chronic systolic (congestive) heart failure: Secondary | ICD-10-CM

## 2019-10-17 DIAGNOSIS — Z95810 Presence of automatic (implantable) cardiac defibrillator: Secondary | ICD-10-CM | POA: Diagnosis not present

## 2019-10-17 DIAGNOSIS — I255 Ischemic cardiomyopathy: Secondary | ICD-10-CM

## 2019-10-17 NOTE — Progress Notes (Signed)
Patient Care Team: Lowella Grip, MD as PCP - General (Internal Medicine) Antonieta Iba, MD as PCP - Cardiology (Cardiology)   HPI  Kevin Pearson is a 69 y.o. male seen in follow-up for an CRT-ICD following a upgrade from a pacemaker implanted 2013 done in [place].  He has freq PVCs which decrease BiV pacing.   History of coronary artery disease with prior multiple revascularizations.  Atrial fibrillation-permanent anticoagulated with Xarelto.  Got sick in [place].  Ended up with a psych hospitalization as well  Dyspnea on exertion.  Edema.  Some accompanying chest discomfort.  DATE TEST EF   2017 Echo   35 %   4/19/  Echo   45-50 %   4/19 LHC 35-45% LAD 30 Cx T RCA 40   Date Cr K Mg Hgb  3/19 1.73 3.8    5/19 1.29 4.0  13.0  7/20 1.38 4.4    4.21 1.53 4.0 1.9 14.7   Complex neuropsychiatric regime,   Records and Results Reviewed   Past Medical History:  Diagnosis Date  . Acid reflux   . Anxiety   . Arthritis   . Atrial fibrillation (HCC)   . CHF (congestive heart failure) (HCC)   . Chronic orthostatic hypotension   . Clotting disorder (HCC)   . COPD (chronic obstructive pulmonary disease) (HCC)   . Depression   . Elevated PSA   . Heart attack (HCC)   . Heart disease   . Heart failure (HCC)   . Hepatitis   . High cholesterol   . Hypertension   . Sleep apnea   . TBI (traumatic brain injury) (HCC)   . Urinary retention     Past Surgical History:  Procedure Laterality Date  . BLADDER SURGERY    . cardiac stents    . CYSTOSCOPY WITH URETHRAL DILATATION N/A 06/25/2016   Procedure: CYSTOSCOPY WITH URETHRAL DILATATION;  Surgeon: Vanna Scotland, MD;  Location: [facility] ORS;  Service: Urology;  Laterality: N/A;  . EP IMPLANTABLE DEVICE     St. Jude BiV-ICD  . ESOPHAGOGASTRODUODENOSCOPY (EGD) WITH PROPOFOL N/A 04/06/2019   Procedure: ESOPHAGOGASTRODUODENOSCOPY (EGD) WITH PROPOFOL;  Surgeon: Wyline Mood, MD;  Location: [facility] ENDOSCOPY;  Service:  Gastroenterology;  Laterality: N/A;  . GREEN LIGHT LASER TURP (TRANSURETHRAL RESECTION OF PROSTATE  2016   done in [place]   . LEFT HEART CATH AND CORONARY ANGIOGRAPHY Left 10/27/2017   Procedure: LEFT HEART CATH AND CORONARY ANGIOGRAPHY;  Surgeon: Antonieta Iba, MD;  Location: [facility] INVASIVE CV LAB;  Service: Cardiovascular;  Laterality: Left;    Current Meds  Medication Sig  . acetaminophen (TYLENOL) 325 MG tablet Take 650 mg by mouth every 6 (six) hours as needed.   Marland Kitchen albuterol (VENTOLIN HFA) 108 (90 Base) MCG/ACT inhaler Inhale 2 puffs into the lungs every 6 (six) hours as needed for wheezing or shortness of breath.  . busPIRone (BUSPAR) 10 MG tablet Take 10 mg by mouth 3 (three) times daily.  . clopidogrel (PLAVIX) 75 MG tablet Take 75 mg by mouth daily.  . divalproex (DEPAKOTE) 500 MG DR tablet Take 750 mg by mouth 2 (two) times daily.   Marland Kitchen gabapentin (NEURONTIN) 300 MG capsule Take 600 mg by mouth 2 (two) times daily.   . Melatonin 5 MG TABS Take 6 mg by mouth at bedtime.   . metoprolol succinate (TOPROL-XL) 50 MG 24 hr tablet TAKE 1 TABLET BY MOUTH EVERY DAY WITH OR IMMEDIATELY AFTER MEAL  . Multiple  Vitamin (MULTIVITAMIN) tablet Take 1 tablet by mouth daily.  . nitroGLYCERIN (NITROSTAT) 0.4 MG SL tablet Place 0.4 mg under the tongue every 5 (five) minutes as needed for chest pain.  Marland Kitchen omeprazole (PRILOSEC) 40 MG capsule Take 40 mg by mouth daily.  . predniSONE (DELTASONE) 10 MG tablet Take 3 tabs bid for 2 days then 1 tablet bid for 5 days.  . rivaroxaban (XARELTO) 20 MG TABS tablet Take 20 mg by mouth at bedtime.  . rosuvastatin (CRESTOR) 20 MG tablet Take 10 mg by mouth daily.   Marland Kitchen senna (SENOKOT) 8.6 MG tablet Take 2 tablets by mouth as needed.   . tamsulosin (FLOMAX) 0.4 MG CAPS capsule TAKE ONE CAPSULE BY MOUTH EVERY DAY AFTER SUPPER  . torsemide (DEMADEX) 20 MG tablet TAKE 2 TABLETS BY MOUTH TWICE A DAY  . traMADol (ULTRAM) 50 MG tablet Take 50 mg by mouth every 6 (six) hours as  needed.   . traZODone (DESYREL) 50 MG tablet Take 100 mg by mouth at bedtime.    Allergies  Allergen Reactions  . Mangifera Indica Anaphylaxis  . Other Anaphylaxis, Itching and Other (See Comments)    Mango skin  . Keflex [Cephalexin] Nausea Only  . Codeine Nausea And Vomiting      Review of Systems negative except from HPI and PMH  Physical Exam BP (!) 150/88 (BP Location: Left Arm, Patient Position: Sitting, Cuff Size: Normal)   Pulse 73   Ht 5' 7.5" (1.715 m)   Wt 234 lb 6 oz (106.3 kg)   SpO2 99%   BMI 36.17 kg/m  Well developed and well nourished in no acute distress HENT normal Neck supple with JVP 8-10 Clear Device pocket well healed; without hematoma or erythema.  There is no tethering  Regular rate and rhythm, no  gallop No  murmur Abd-soft with active BS No Clubbing cyanosis 2+ edema Skin-warm and dry A & Oriented  Grossly normal sensory and motor function  ECG demonstrates atrial fibrillation with ventricular pacing negative QRS in lead I and RS in lead V1  Assessment and  Plan  Ischemic cardiomyopathy  Atrial fibrillation-permanent  Implantable defibrillator-CRT-Saint Jude  Congestive heart failure-acute/chronic systolic/diastolic class III  Renal insufficiency grade 3  Obesity  Depression question manic depression  COPD  PVCs -freq dual morphologies   Ventricular tachycardia     Frequent ventricular ectopy.  Not responsive to resolving.  With a dimorphic morphology the same with about ablation.  We will follow for now; will increase his ventricular pacing rate to try to overdrive suppress some of his ventricular ectopy; he continues on VVTR mode  Volume overloaded encourage decrease fluid intake.  His sodium is already restricted.  We will increase his diuretics from 40 twice daily--80 every morning x3 days.  Given the increased burden of ventricular ectopy, we will undertake Myoview scanning.  Reviewed this with Dr. Deidre Ala; CTA in the  context of his multiple stents will lose sensitivity  Recurrent fast polymorphic ventricular tachycardia.  Differential would include ischemia, drugs, electrolyte.  Electrolytes have been normal.  Do not see any drugs on the long QT drugs website we will check for ischemia as above \ Instructed on [place] driving restrictions  On Anticoagulation;  No bleeding issues

## 2019-10-17 NOTE — Patient Instructions (Addendum)
Medication Instructions:  - Your physician has recommended you make the following change in your medication:   1) Change torsemide 20 mg- take 4 tablets (80 mg) by mouth once daily in the morning x 3 days, then resume normal dosing   *If you need a refill on your cardiac medications before your next appointment, please call your pharmacy*   Lab Work: - none ordered  If you have labs (blood work) drawn today and your tests are completely normal, you will receive your results only by: Marland Kitchen MyChart Message (if you have MyChart) OR . A paper copy in the mail If you have any lab test that is abnormal or we need to change your treatment, we will call you to review the results.   Testing/Procedures: - Your physician has requested that you have a lexiscan myoview.  [place]  Your caregiver has ordered a Stress Test with nuclear imaging. The purpose of this test is to evaluate the blood supply to your heart muscle. This procedure is referred to as a "Non-Invasive Stress Test." This is because other than having an IV started in your vein, nothing is inserted or "invades" your body. Cardiac stress tests are done to find areas of poor blood flow to the heart by determining the extent of coronary artery disease (CAD). Some patients exercise on a treadmill, which naturally increases the blood flow to your heart, while others who are  unable to walk on a treadmill due to physical limitations have a pharmacologic/chemical stress agent called Lexiscan . This medicine will mimic walking on a treadmill by temporarily increasing your coronary blood flow.   Please note: these test may take anywhere between 2-4 hours to complete  PLEASE REPORT TO [facility] AT THE FIRST DESK WILL DIRECT YOU WHERE TO GO  Date of Procedure:_____________________________________  Arrival Time for Procedure:______________________________  Instructions regarding medication:   _x___ : Hold  torsemide the morning of your test  __x__: You may take the rest of your regular medications as you normally would (not listed above) the morning of your test    PLEASE NOTIFY THE OFFICE AT LEAST 24 HOURS IN ADVANCE IF YOU ARE UNABLE TO [place].  [phone] AND  PLEASE NOTIFY NUCLEAR MEDICINE AT [facility] AT LEAST 24 HOURS IN ADVANCE IF YOU ARE UNABLE TO KEEP YOUR APPOINTMENT. [phone]  How to prepare for your Myoview test:  1. Do not eat or drink after midnight 2. No caffeine for 24 hours prior to test 3. No smoking 24 hours prior to test. 4. Your medication may be taken with water.  If your doctor stopped a medication because of this test, do not take that medication. 5. Ladies, please do not wear dresses.  Skirts or pants are appropriate. Please wear a short sleeve shirt. 6. No perfume, cologne or lotion. 7. Wear comfortable walking shoes. No heels!   Follow-Up: At [facility], you and your health needs are our priority.  As part of our continuing mission to provide you with exceptional heart care, we have created designated Provider Care Teams.  These Care Teams include your primary Cardiologist (physician) and Advanced Practice Providers (APPs -  Physician Assistants and Nurse Practitioners) who all work together to provide you with the care you need, when you need it.  We recommend signing up for the patient portal called "MyChart".  Sign up information is provided on this After Visit Summary.  MyChart is used to connect with patients for  Virtual Visits (Telemedicine).  Patients are able to view lab/test results, encounter notes, upcoming appointments, etc.  Non-urgent messages can be sent to your provider as well.   To learn more about what you can do with MyChart, go to [email or web address].    Your next appointment:   1) July 2021 with Dr. Mariah Milling (per recall)  2) 1 year with Dr. Graciela Husbands  The format for your next appointment:   In Person  Provider:   as  above   Other Instructions N/a   Cardiac Nuclear Scan A cardiac nuclear scan is a test that measures blood flow to the heart when a person is resting and when he or she is exercising. The test looks for problems such as:  Not enough blood reaching a portion of the heart.  The heart muscle not working normally. You may need this test if:  You have heart disease.  You have had abnormal lab results.  You have had heart surgery or a balloon procedure to open up blocked arteries (angioplasty).  You have chest pain.  You have shortness of breath. In this test, a radioactive dye (tracer) is injected into your bloodstream. After the tracer has traveled to your heart, an imaging device is used to measure how much of the tracer is absorbed by or distributed to various areas of your heart. This procedure is usually done at a hospital and takes 2-4 hours. Tell a health care provider about:  Any allergies you have.  All medicines you are taking, including vitamins, herbs, eye drops, creams, and over-the-counter medicines.  Any problems you or family members have had with anesthetic medicines.  Any blood disorders you have.  Any surgeries you have had.  Any medical conditions you have.  Whether you are pregnant or may be pregnant. What are the risks? Generally, this is a safe procedure. However, problems may occur, including:  Serious chest pain and heart attack. This is only a risk if the stress portion of the test is done.  Rapid heartbeat.  Sensation of warmth in your chest. This usually passes quickly.  Allergic reaction to the tracer. What happens before the procedure?  Ask your health care provider about changing or stopping your regular medicines. This is especially important if you are taking diabetes medicines or blood thinners.  Follow instructions from your health care provider about eating or drinking restrictions.  Remove your jewelry on the day of the  procedure. What happens during the procedure?  An IV will be inserted into one of your veins.  Your health care provider will inject a small amount of radioactive tracer through the IV.  You will wait for 20-40 minutes while the tracer travels through your bloodstream.  Your heart activity will be monitored with an electrocardiogram (ECG).  You will lie down on an exam table.  Images of your heart will be taken for about 15-20 minutes.  You may also have a stress test. For this test, one of the following may be done: ? You will exercise on a treadmill or stationary bike. While you exercise, your heart's activity will be monitored with an ECG, and your blood pressure will be checked. ? You will be given medicines that will increase blood flow to parts of your heart. This is done if you are unable to exercise.  When blood flow to your heart has peaked, a tracer will again be injected through the IV.  After 20-40 minutes, you will get back on the exam table and  have more images taken of your heart.  Depending on the type of tracer used, scans may need to be repeated 3-4 hours later.  Your IV line will be removed when the procedure is over. The procedure may vary among health care providers and hospitals. What happens after the procedure?  Unless your health care provider tells you otherwise, you may return to your normal schedule, including diet, activities, and medicines.  Unless your health care provider tells you otherwise, you may increase your fluid intake. This will help to flush the contrast dye from your body. Drink enough fluid to keep your urine pale yellow.  Ask your health care provider, or the department that is doing the test: ? When will my results be ready? ? How will I get my results? Summary  A cardiac nuclear scan measures the blood flow to the heart when a person is resting and when he or she is exercising.  Tell your health care provider if you are pregnant.   Before the procedure, ask your health care provider about changing or stopping your regular medicines. This is especially important if you are taking diabetes medicines or blood thinners.  After the procedure, unless your health care provider tells you otherwise, increase your fluid intake. This will help flush the contrast dye from your body.  After the procedure, unless your health care provider tells you otherwise, you may return to your normal schedule, including diet, activities, and medicines. This information is not intended to replace advice given to you by your health care provider. Make sure you discuss any questions you have with your health care provider. Document Revised: 12/06/2017 Document Reviewed: 12/06/2017 Elsevier Patient Education  2020 [employer].

## 2019-10-20 LAB — CUP PACEART INCLINIC DEVICE CHECK
Battery Remaining Longevity: 20 mo
Brady Statistic RA Percent Paced: 0 %
Brady Statistic RV Percent Paced: 74 %
Date Time Interrogation Session: 20210413095400
HighPow Impedance: 66 Ohm
Implantable Lead Implant Date: 20131003
Implantable Lead Implant Date: 20131003
Implantable Lead Location: 753858
Implantable Lead Location: 753860
Implantable Pulse Generator Implant Date: 20131003
Lead Channel Impedance Value: 487.5 Ohm
Lead Channel Impedance Value: 862.5 Ohm
Lead Channel Pacing Threshold Amplitude: 0.625 V
Lead Channel Pacing Threshold Amplitude: 1.125 V
Lead Channel Pacing Threshold Pulse Width: 0.5 ms
Lead Channel Pacing Threshold Pulse Width: 0.6 ms
Lead Channel Sensing Intrinsic Amplitude: 12 mV
Lead Channel Setting Pacing Amplitude: 2 V
Lead Channel Setting Pacing Amplitude: 2.125
Lead Channel Setting Pacing Pulse Width: 0.5 ms
Lead Channel Setting Pacing Pulse Width: 0.6 ms
Lead Channel Setting Sensing Sensitivity: 0.5 mV
Pulse Gen Serial Number: 7055215

## 2019-10-26 ENCOUNTER — Ambulatory Visit: Payer: Medicare Other | Admitting: Internal Medicine

## 2019-10-26 ENCOUNTER — Other Ambulatory Visit: Payer: Self-pay

## 2019-11-02 ENCOUNTER — Encounter
Admission: RE | Admit: 2019-11-02 | Discharge: 2019-11-02 | Disposition: A | Discharge status: Home / Self Care | Payer: Medicare Other | Source: Ambulatory Visit | Attending: Internal Medicine | Admitting: Internal Medicine

## 2019-11-02 ENCOUNTER — Other Ambulatory Visit: Payer: Self-pay

## 2019-11-02 DIAGNOSIS — I472 Ventricular tachycardia, unspecified: Secondary | ICD-10-CM

## 2019-11-02 DIAGNOSIS — I255 Ischemic cardiomyopathy: Secondary | ICD-10-CM | POA: Diagnosis not present

## 2019-11-02 DIAGNOSIS — I700 Atherosclerosis of aorta: Secondary | ICD-10-CM | POA: Insufficient documentation

## 2019-11-02 DIAGNOSIS — I2510 Atherosclerotic heart disease of native coronary artery without angina pectoris: Secondary | ICD-10-CM | POA: Insufficient documentation

## 2019-11-02 MED ORDER — TECHNETIUM TC 99M TETROFOSMIN IV KIT
30.0000 | PACK | Freq: Once | INTRAVENOUS | Status: AC | PRN
  Administered 2019-11-02: 30.9 via INTRAVENOUS

## 2019-11-02 MED ORDER — TECHNETIUM TC 99M TETROFOSMIN IV KIT
10.0000 | PACK | Freq: Once | INTRAVENOUS | Status: AC | PRN
  Administered 2019-11-02: 10.869 via INTRAVENOUS

## 2019-11-02 MED ORDER — REGADENOSON 0.4 MG/5ML IV SOLN
0.4000 mg | Freq: Once | INTRAVENOUS | Status: AC
  Administered 2019-11-02: 0.4 mg via INTRAVENOUS

## 2019-11-03 LAB — NM MYOCAR MULTI W/SPECT W/WALL MOTION / EF
Estimated workload: 1 METS
Exercise duration (min): 0 min
Exercise duration (sec): 0 s
LV dias vol: 108 mL (ref 62–150)
LV sys vol: 34 mL
MPHR: 153 {beats}/min
Peak HR: 81 {beats}/min
Percent HR: 52 %
Rest HR: 75 {beats}/min
SDS: 11
SRS: 0
SSS: 11
TID: 0.9

## 2019-11-07 ENCOUNTER — Telehealth: Payer: Self-pay

## 2019-11-07 NOTE — Telephone Encounter (Signed)
Patient called wanting someone to call him about results on a xray that [facility] sent him for. Please call pt back with this information

## 2019-11-09 NOTE — Telephone Encounter (Signed)
The patient had a lexiscan myoview on 11/03/19. This is currently in Dr. Odessa Fleming in basket to review.  Will forward to MD to please sign off.

## 2019-11-10 ENCOUNTER — Ambulatory Visit (INDEPENDENT_AMBULATORY_CARE_PROVIDER_SITE_OTHER): Payer: Medicare Other | Admitting: *Deleted

## 2019-11-10 DIAGNOSIS — I5022 Chronic systolic (congestive) heart failure: Secondary | ICD-10-CM | POA: Diagnosis not present

## 2019-11-10 LAB — CUP PACEART REMOTE DEVICE CHECK
Battery Remaining Longevity: 24 mo
Battery Remaining Percentage: 28 %
Battery Voltage: 2.84 V
Date Time Interrogation Session: 20210507020017
HighPow Impedance: 73 Ohm
HighPow Impedance: 73 Ohm
Implantable Lead Implant Date: 20131003
Implantable Lead Implant Date: 20131003
Implantable Lead Location: 753858
Implantable Lead Location: 753860
Implantable Pulse Generator Implant Date: 20131003
Lead Channel Impedance Value: 390 Ohm
Lead Channel Impedance Value: 930 Ohm
Lead Channel Pacing Threshold Amplitude: 0.625 V
Lead Channel Pacing Threshold Amplitude: 1.25 V
Lead Channel Pacing Threshold Pulse Width: 0.5 ms
Lead Channel Pacing Threshold Pulse Width: 0.6 ms
Lead Channel Sensing Intrinsic Amplitude: 11.3 mV
Lead Channel Setting Pacing Amplitude: 2 V
Lead Channel Setting Pacing Amplitude: 2.25 V
Lead Channel Setting Pacing Pulse Width: 0.5 ms
Lead Channel Setting Pacing Pulse Width: 0.6 ms
Lead Channel Setting Sensing Sensitivity: 0.5 mV
Pulse Gen Serial Number: 7055215

## 2019-11-10 NOTE — Progress Notes (Signed)
Remote ICD transmission.   

## 2019-11-10 NOTE — Telephone Encounter (Signed)
Kevin Pearson I have reviewed recent stress test and the one in 2019 Reviewed your note, sounds like some chest pain, PVCs/ectopy, shortness of breath, edema  His ischemia is noted but does not appear overwhelming There is certainly some mild to moderate ischemia in a  moderate size distribution mid to apical inferior wall Appears to have some prior mild fixed defect in that region as well  He does have an occluded mid left circumflex which makes interpretation of this area more difficult as I am not sure if collaterals could be contributing to the reversible perfusion defect  Certainly catheterization could be done if he is having symptoms and would like to proceed Would probably perform a right and left Some caution given creatinine in December 1.66, would need a repeat before planning Let me know if you want me to see him to discuss or perhaps Herbert Seta could talk with him over the phone Thank you

## 2019-11-10 NOTE — Telephone Encounter (Signed)
Kevin Salvia, MD  11/09/2019 1:20 PM EDT    Please Inform Patient that stress test showed extensive evidence of ischemia, stable* * heart muscle function *  Tim we spoke about this guy. At this point I guess the question is should we refer him for catheterization given his symptoms and his scan.

## 2019-11-10 NOTE — Telephone Encounter (Signed)
To Dr. Mariah Milling to review & for further recommendations.

## 2019-11-13 ENCOUNTER — Telehealth: Payer: Self-pay | Admitting: *Deleted

## 2019-11-13 ENCOUNTER — Ambulatory Visit (INDEPENDENT_AMBULATORY_CARE_PROVIDER_SITE_OTHER): Payer: Medicare Other | Admitting: Cardiovascular Disease

## 2019-11-13 ENCOUNTER — Encounter: Payer: Self-pay | Admitting: Cardiovascular Disease

## 2019-11-13 ENCOUNTER — Other Ambulatory Visit
Admission: RE | Admit: 2019-11-13 | Discharge: 2019-11-13 | Disposition: A | Discharge status: Home / Self Care | Payer: Medicare Other | Source: Ambulatory Visit | Attending: Cardiovascular Disease | Admitting: Cardiovascular Disease

## 2019-11-13 ENCOUNTER — Other Ambulatory Visit: Payer: Self-pay

## 2019-11-13 VITALS — BP 112/70 | HR 75 | Ht 67.5 in | Wt 228.0 lb

## 2019-11-13 DIAGNOSIS — I200 Unstable angina: Secondary | ICD-10-CM | POA: Insufficient documentation

## 2019-11-13 DIAGNOSIS — I472 Ventricular tachycardia, unspecified: Secondary | ICD-10-CM

## 2019-11-13 DIAGNOSIS — R9439 Abnormal result of other cardiovascular function study: Secondary | ICD-10-CM | POA: Insufficient documentation

## 2019-11-13 DIAGNOSIS — R110 Nausea: Secondary | ICD-10-CM | POA: Diagnosis present

## 2019-11-13 DIAGNOSIS — I5022 Chronic systolic (congestive) heart failure: Secondary | ICD-10-CM

## 2019-11-13 LAB — CBC
HCT: 39.8 % (ref 39.0–52.0)
Hemoglobin: 13.9 g/dL (ref 13.0–17.0)
MCH: 31.4 pg (ref 26.0–34.0)
MCHC: 34.9 g/dL (ref 30.0–36.0)
MCV: 89.8 fL (ref 80.0–100.0)
Platelets: 214 10*3/uL (ref 150–400)
RBC: 4.43 MIL/uL (ref 4.22–5.81)
RDW: 14.7 % (ref 11.5–15.5)
WBC: 7 10*3/uL (ref 4.0–10.5)
nRBC: 0 % (ref 0.0–0.2)

## 2019-11-13 LAB — BASIC METABOLIC PANEL
Anion gap: 12 (ref 5–15)
BUN: 20 mg/dL (ref 8–23)
CO2: 26 mmol/L (ref 22–32)
Calcium: 9.6 mg/dL (ref 8.9–10.3)
Chloride: 102 mmol/L (ref 98–111)
Creatinine, Ser: 1.52 mg/dL — ABNORMAL HIGH (ref 0.61–1.24)
GFR calc Af Amer: 54 mL/min — ABNORMAL LOW (ref >60)
GFR calc non Af Amer: 47 mL/min — ABNORMAL LOW (ref >60)
Glucose, Bld: 90 mg/dL (ref 70–99)
Potassium: 3.3 mmol/L — ABNORMAL LOW (ref 3.5–5.1)
Sodium: 140 mmol/L (ref 135–145)

## 2019-11-13 LAB — MAGNESIUM: Magnesium: 1.5 mg/dL — ABNORMAL LOW (ref 1.7–2.4)

## 2019-11-13 NOTE — Telephone Encounter (Signed)
I spoke with the patient regarding Dr. Windell Hummingbird impression below from his lexiscan myoview. The patient states he is still having ongoing SOB/ chest discomfort and is not opposed to having a cardiac cath done.   I have offered him an appointment this afternoon to discuss this with Dr. Mariah Milling at 3:00 pm.  The patient voices understanding and is agreeable.
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
## 2019-11-13 NOTE — Telephone Encounter (Signed)
-----   Message from Antonieta Iba, MD sent at 11/01/2019 12:14 PM EDT ----- He would most likely qualify for cardiac rehab for several reasons Coronary disease with stable angina Chronic systolic CHF  Would also likely qualify for respiratory rehab COPD Sleep apnea on CPAP Thx TG  ----- Message ----- From: Jefferey Pica, RN Sent: 10/23/2019   2:43 PM EDT To: Jefferey Pica, RN, Antonieta Iba, MD  Hey Dr. Mariah Milling- this patient of yours and SK's wanted to go back to Cardiac Rehab. Dr. Graciela Husbands didn't know if he would qualify. I told the patient I wasn't sure if he would.  He is pending a lexiscan on 4/23 for VT.   Any thoughts on Cardiac Rehab for him.  He made me promise to ask you.

## 2019-11-13 NOTE — Telephone Encounter (Signed)
The patient has been made aware of Dr. Windell Hummingbird recommendations, but is coming in today for an abnormal stress test.   He is aware Cardiac Rehab and can addressed after his abnormal findings/ ongoing symptoms are worked up.   The patient voices understanding.

## 2019-11-13 NOTE — Patient Instructions (Addendum)
We will schedule a heart cath (right and left) for angina, positive stress test  Medication Instructions:  No changes  If you need a refill on your cardiac medications before your next appointment, please call your pharmacy.    Lab work: No new labs needed   If you have labs (blood work) drawn today and your tests are completely normal, you will receive your results only by: Marland Kitchen MyChart Message (if you have MyChart) OR . A paper copy in the mail If you have any lab test that is abnormal or we need to change your treatment, we will call you to review the results.   Testing/Procedures: [facility] Cardiac Cath Instructions   You are scheduled for a Cardiac Cath on: Thursday May 13th  Please arrive at 07:30 am on the day of your procedure  Please expect a call from our [facility] Pre-Service Center to pre-register you  Do not eat/drink anything after midnight  Someone will need to drive you home  It is recommended someone be with you for the first 24 hours after your procedure  Wear clothes that are easy to get on/off and wear slip on shoes if possible   Medications bring a current list of all medications with you  _XX__ COVID swab Tuesday go to the [facility] building drive thru between 6:76 AM to 1:00 PM.   _XX__ Do not take Xarelto Tuesday, Wednesday or Thursday  _XX__ You may take all of your other medications the morning of your procedure with enough water to swallow safely.   Day of your procedure: Arrive at the [facility] entrance.  Free valet service is available.  After entering the [facility] please check-in at the registration desk (1st desk on your right) to receive your armband. After receiving your armband someone will escort you to the cardiac cath/special procedures waiting area.  The usual length of stay after your procedure is about 2 to 3 hours.  This can vary.  If you have any questions, please call our office at [phone], or you may call the  cardiac cath lab at [facility] directly at [phone]    Follow-Up: At [facility], you and your health needs are our priority.  As part of our continuing mission to provide you with exceptional heart care, we have created designated Provider Care Teams.  These Care Teams include your primary Cardiologist (physician) and Advanced Practice Providers (APPs -  Physician Assistants and Nurse Practitioners) who all work together to provide you with the care you need, when you need it.  . You will need a follow up appointment in 1 month   . Providers on your designated Care Team:   . Nicolasa Ducking, NP . Eula Listen, PA-C . Marisue Ivan, PA-C  Any Other Special Instructions Will Be Listed Below (If Applicable).  For educational health videos Log in to : [email or web address] Or : [email or web address], password : [email or web address]

## 2019-11-14 ENCOUNTER — Telehealth: Payer: Self-pay | Admitting: *Deleted

## 2019-11-14 ENCOUNTER — Other Ambulatory Visit
Admission: RE | Admit: 2019-11-14 | Discharge: 2019-11-14 | Disposition: A | Discharge status: Home / Self Care | Payer: Medicare Other | Source: Ambulatory Visit | Attending: Cardiovascular Disease | Admitting: Cardiovascular Disease

## 2019-11-14 DIAGNOSIS — Z01812 Encounter for preprocedural laboratory examination: Secondary | ICD-10-CM | POA: Insufficient documentation

## 2019-11-14 DIAGNOSIS — Z20822 Contact with and (suspected) exposure to covid-19: Secondary | ICD-10-CM | POA: Insufficient documentation

## 2019-11-14 LAB — SARS CORONAVIRUS 2 (TAT 6-24 HRS): SARS Coronavirus 2: NEGATIVE

## 2019-11-14 NOTE — Telephone Encounter (Signed)
Spoke with patient and reviewed that provider would like him to hold torsemide until after his cath on Thursday. Also provider would like him to eat some extra bananas or take some over the counter potassium. Patient states that this would not be a problem and also confirmed that he needs to have COVID swab done today. He verbalized understanding of our conversation, agreement with plan, and had no further questions at this time.

## 2019-11-14 NOTE — Telephone Encounter (Signed)
-----   Message from Antonieta Iba, MD sent at 11/14/2019  7:56 AM EDT ----- Labs: Would hold torsemide from now until after cath Potassium a little low

## 2019-11-15 ENCOUNTER — Other Ambulatory Visit: Payer: Self-pay | Admitting: Cardiovascular Disease

## 2019-11-15 DIAGNOSIS — I200 Unstable angina: Secondary | ICD-10-CM

## 2019-11-16 ENCOUNTER — Other Ambulatory Visit: Payer: Self-pay

## 2019-11-16 ENCOUNTER — Encounter: Payer: Self-pay | Admitting: Cardiovascular Disease

## 2019-11-16 ENCOUNTER — Encounter: Payer: Self-pay | Admitting: Registered Nurse

## 2019-11-16 ENCOUNTER — Encounter
Admission: RE | Disposition: A | Discharge status: Home / Self Care | Payer: Self-pay | Source: Ambulatory Visit | Attending: Cardiovascular Disease

## 2019-11-16 ENCOUNTER — Ambulatory Visit
Admission: RE | Admit: 2019-11-16 | Discharge: 2019-11-16 | Disposition: A | Discharge status: Home / Self Care | Payer: Medicare Other | Source: Ambulatory Visit | Attending: Cardiovascular Disease | Admitting: Cardiovascular Disease

## 2019-11-16 DIAGNOSIS — N19 Unspecified kidney failure: Secondary | ICD-10-CM | POA: Insufficient documentation

## 2019-11-16 DIAGNOSIS — Z87891 Personal history of nicotine dependence: Secondary | ICD-10-CM | POA: Diagnosis not present

## 2019-11-16 DIAGNOSIS — J449 Chronic obstructive pulmonary disease, unspecified: Secondary | ICD-10-CM | POA: Diagnosis not present

## 2019-11-16 DIAGNOSIS — R9439 Abnormal result of other cardiovascular function study: Secondary | ICD-10-CM | POA: Diagnosis not present

## 2019-11-16 DIAGNOSIS — Z7901 Long term (current) use of anticoagulants: Secondary | ICD-10-CM | POA: Diagnosis not present

## 2019-11-16 DIAGNOSIS — F329 Major depressive disorder, single episode, unspecified: Secondary | ICD-10-CM | POA: Insufficient documentation

## 2019-11-16 DIAGNOSIS — I25110 Atherosclerotic heart disease of native coronary artery with unstable angina pectoris: Secondary | ICD-10-CM | POA: Insufficient documentation

## 2019-11-16 DIAGNOSIS — Z79899 Other long term (current) drug therapy: Secondary | ICD-10-CM | POA: Insufficient documentation

## 2019-11-16 DIAGNOSIS — K219 Gastro-esophageal reflux disease without esophagitis: Secondary | ICD-10-CM | POA: Insufficient documentation

## 2019-11-16 DIAGNOSIS — I890 Lymphedema, not elsewhere classified: Secondary | ICD-10-CM | POA: Diagnosis not present

## 2019-11-16 DIAGNOSIS — E782 Mixed hyperlipidemia: Secondary | ICD-10-CM | POA: Insufficient documentation

## 2019-11-16 DIAGNOSIS — Z7902 Long term (current) use of antithrombotics/antiplatelets: Secondary | ICD-10-CM | POA: Insufficient documentation

## 2019-11-16 DIAGNOSIS — I5022 Chronic systolic (congestive) heart failure: Secondary | ICD-10-CM | POA: Diagnosis not present

## 2019-11-16 DIAGNOSIS — G4733 Obstructive sleep apnea (adult) (pediatric): Secondary | ICD-10-CM | POA: Diagnosis not present

## 2019-11-16 DIAGNOSIS — I255 Ischemic cardiomyopathy: Secondary | ICD-10-CM | POA: Insufficient documentation

## 2019-11-16 DIAGNOSIS — I252 Old myocardial infarction: Secondary | ICD-10-CM | POA: Diagnosis not present

## 2019-11-16 DIAGNOSIS — I110 Hypertensive heart disease with heart failure: Secondary | ICD-10-CM | POA: Insufficient documentation

## 2019-11-16 DIAGNOSIS — I4891 Unspecified atrial fibrillation: Secondary | ICD-10-CM | POA: Insufficient documentation

## 2019-11-16 DIAGNOSIS — F419 Anxiety disorder, unspecified: Secondary | ICD-10-CM | POA: Insufficient documentation

## 2019-11-16 DIAGNOSIS — E7800 Pure hypercholesterolemia, unspecified: Secondary | ICD-10-CM | POA: Insufficient documentation

## 2019-11-16 DIAGNOSIS — I200 Unstable angina: Secondary | ICD-10-CM

## 2019-11-16 HISTORY — PX: RIGHT/LEFT HEART CATH AND CORONARY ANGIOGRAPHY: CATH118266

## 2019-11-16 SURGERY — RIGHT AND LEFT HEART CATH
Anesthesia: Moderate Sedation

## 2019-11-16 SURGERY — RIGHT/LEFT HEART CATH AND CORONARY ANGIOGRAPHY
Anesthesia: Moderate Sedation

## 2019-11-16 MED ORDER — ASPIRIN 81 MG PO CHEW
CHEWABLE_TABLET | ORAL | Status: AC
  Filled 2019-11-16: qty 1

## 2019-11-16 MED ORDER — SODIUM CHLORIDE 0.9% FLUSH: 3.0000 mL | INTRAVENOUS | Status: DC | PRN

## 2019-11-16 MED ORDER — SODIUM CHLORIDE 0.9 % WEIGHT BASED INFUSION: 3.0000 mL/kg/h | INTRAVENOUS | Status: AC

## 2019-11-16 MED ORDER — LABETALOL HCL 5 MG/ML IV SOLN: 10.0000 mg | INTRAVENOUS | Status: DC | PRN

## 2019-11-16 MED ORDER — SODIUM CHLORIDE 0.9% FLUSH: 3.0000 mL | Freq: Two times a day (BID) | INTRAVENOUS | Status: DC

## 2019-11-16 MED ORDER — FENTANYL CITRATE (PF) 100 MCG/2ML IJ SOLN
INTRAMUSCULAR | Status: DC | PRN
  Administered 2019-11-16: 25 ug via INTRAVENOUS
  Administered 2019-11-16: 50 ug via INTRAVENOUS

## 2019-11-16 MED ORDER — ACETAMINOPHEN 325 MG PO TABS: 650.0000 mg | ORAL_TABLET | ORAL | Status: DC | PRN

## 2019-11-16 MED ORDER — SODIUM CHLORIDE 0.9 % WEIGHT BASED INFUSION: 1.0000 mL/kg/h | INTRAVENOUS | Status: DC

## 2019-11-16 MED ORDER — ASPIRIN 81 MG PO CHEW
81.0000 mg | CHEWABLE_TABLET | ORAL | Status: AC
  Administered 2019-11-16: 81 mg via ORAL

## 2019-11-16 MED ORDER — HEPARIN (PORCINE) IN NACL 1000-0.9 UT/500ML-% IV SOLN
INTRAVENOUS | Status: DC | PRN
  Administered 2019-11-16: 500 mL

## 2019-11-16 MED ORDER — MIDAZOLAM HCL 2 MG/2ML IJ SOLN
INTRAMUSCULAR | Status: AC
  Filled 2019-11-16: qty 2

## 2019-11-16 MED ORDER — FENTANYL CITRATE (PF) 100 MCG/2ML IJ SOLN
INTRAMUSCULAR | Status: AC
  Filled 2019-11-16: qty 2

## 2019-11-16 MED ORDER — MIDAZOLAM HCL 2 MG/2ML IJ SOLN
INTRAMUSCULAR | Status: DC | PRN
  Administered 2019-11-16 (×2): 1 mg via INTRAVENOUS

## 2019-11-16 MED ORDER — IOHEXOL 300 MG/ML  SOLN
INTRAMUSCULAR | Status: DC | PRN
  Administered 2019-11-16: 75 mL

## 2019-11-16 MED ORDER — HEPARIN (PORCINE) IN NACL 1000-0.9 UT/500ML-% IV SOLN
INTRAVENOUS | Status: AC
  Filled 2019-11-16: qty 1000

## 2019-11-16 MED ORDER — HYDRALAZINE HCL 20 MG/ML IJ SOLN: 10.0000 mg | INTRAMUSCULAR | Status: DC | PRN

## 2019-11-16 MED ORDER — ONDANSETRON HCL 4 MG/2ML IJ SOLN: 4.0000 mg | Freq: Four times a day (QID) | INTRAMUSCULAR | Status: DC | PRN

## 2019-11-16 MED ORDER — SODIUM CHLORIDE 0.9 % IV SOLN: 250.0000 mL | INTRAVENOUS | Status: DC | PRN

## 2019-11-16 MED ORDER — SODIUM CHLORIDE 0.9 % WEIGHT BASED INFUSION
1.0000 mL/kg/h | INTRAVENOUS | Status: DC
  Administered 2019-11-16: 1 mL/kg/h via INTRAVENOUS

## 2019-11-16 SURGICAL SUPPLY — 12 items
CATH INFINITI 5FR ANG PIGTAIL (CATHETERS) ×1 IMPLANT
CATH INFINITI 5FR JL4 (CATHETERS) ×1 IMPLANT
CATH INFINITI JR4 5F (CATHETERS) ×1 IMPLANT
CATH SWANZ 7F THERMO (CATHETERS) ×1 IMPLANT
GUIDEWIRE EMER 3M J .025X150CM (WIRE) ×1 IMPLANT
KIT MANI 3VAL PERCEP (MISCELLANEOUS) ×2 IMPLANT
NDL PERC 18GX7CM (NEEDLE) IMPLANT
NEEDLE PERC 18GX7CM (NEEDLE) ×2 IMPLANT
PACK CARDIAC CATH (CUSTOM PROCEDURE TRAY) ×1 IMPLANT
SHEATH AVANTI 5FR X 11CM (SHEATH) ×1 IMPLANT
SHEATH AVANTI 7FRX11 (SHEATH) ×1 IMPLANT
WIRE GUIDERIGHT .035X150 (WIRE) ×1 IMPLANT

## 2019-11-16 NOTE — Discharge Instructions (Signed)
Coronary Angiogram A coronary angiogram is an X-ray procedure that is used to examine the arteries in the heart. Contrast dye is injected through a long, thin tube (catheter) into these arteries. Then X-rays are taken to show any blockage in these arteries. You may have this procedure if you: Are having chest pain, or other symptoms of angina, and you are at risk for heart disease. Have an abnormal stress test or test of your heart's electrical activity (electrocardiogram, or ECG). Have chest pain and heart failure. Are having irregular heart rhythms. A coronary angiogram or heart catheterization can show if you have valve disease or a disease of the aorta. This procedure can also be used to check the overall function of your heart muscle. Let your health care provider know about: Any allergies you have, including allergies to medicines or contrast dye. All medicines you are taking, including vitamins, herbs, eye drops, creams, and over-the-counter medicines. Any problems you or family members have had with anesthetic medicines. Any blood disorders you have. Any surgeries you have had. Any history of kidney problems or kidney failure. Any medical conditions you have. Whether you are pregnant or may be pregnant. Whether you are breastfeeding. What are the risks? Generally, this is a safe procedure. However, problems may occur, including: Infection. Allergic reaction to medicines or dyes that are used. Bleeding from the insertion site or other places. Damage to nearby structures, such as blood vessels, or damage to kidneys from contrast dye. Irregular heart rhythms. Stroke (rare). Heart attack (rare). What happens before the procedure? Staying hydrated Follow instructions from your health care provider about hydration, which may include: Up to 2 hours before the procedure - you may continue to drink clear liquids, such as water, clear fruit juice, black coffee, and plain tea.  Eating  and drinking restrictions Follow instructions from your health care provider about eating and drinking, which may include: 8 hours before the procedure - stop eating heavy meals or foods, such as meat, fried foods, or fatty foods. 6 hours before the procedure - stop eating light meals or foods, such as toast or cereal. 6 hours before the procedure - stop drinking milk or drinks that contain milk. 2 hours before the procedure - stop drinking clear liquids. Medicines Ask your health care provider about: Changing or stopping your regular medicines. This is especially important if you are taking diabetes medicines or blood thinners. Taking medicines such as aspirin and ibuprofen. These medicines can thin your blood. Do not take these medicines unless your health care provider tells you to take them. Aspirin may be recommended before coronary angiograms even if you do not normally take it. Taking over-the-counter medicines, vitamins, herbs, and supplements. General instructions Do not use any products that contain nicotine or tobacco for at least 4 weeks before the procedure. These products include cigarettes, e-cigarettes, and chewing tobacco. If you need help quitting, ask your health care provider. You may have an exam or testing. Plan to have someone take you home from the hospital or clinic. If you will be going home right after the procedure, plan to have someone with you for 24 hours. Ask your health care provider: How your insertion site will be marked. What steps will be taken to help prevent infection. These may include: Removing hair at the insertion site. Washing skin with a germ-killing soap. Taking antibiotic medicine. What happens during the procedure?  You will lie on your back on an X-ray table. An IV will be inserted into   one of your veins. Electrodes will be placed on your chest. You will be given one or more of the following: A medicine to help you relax (sedative). A  medicine to numb the catheter insertion area (local anesthetic). You will be connected to a continuous ECG monitor. The catheter will be inserted into an artery in one of these areas: Your groin area in your upper thigh. Your wrist. The fold of your arm, near your elbow. An X-ray procedure (fluoroscopy) will be used to help guide the catheter to the opening of the blood vessel to be used. A dye will be injected into the catheter and X-rays will be taken. The dye will help to show any narrowing or blockages in the heart arteries. Tell your health care provider if you have chest pain or trouble breathing. If blockages are found, another procedure may be done to open the artery. The catheter will be removed after the fluoroscopy is complete. A bandage (dressing) will be placed over the insertion site. Pressure will be applied to stop bleeding. The IV will be removed. The procedure may vary among health care providers and hospitals. What happens after the procedure? Your blood pressure, heart rate, breathing rate, and blood oxygen level will be monitored until you leave the hospital or clinic. You will need to lie still for a few hours, or for as long as told by your health care provider. If the procedure is done through the groin, you will be told not to bend or cross your legs. The insertion site and the pulse in your foot or wrist will be checked often. More blood tests, X-rays, and an ECG may be done. Do not drive for 24 hours if you were given a sedative during your procedure. Summary A coronary angiogram is an X-ray procedure that is used to examine the arteries in the heart. Contrast dye is injected through a long, thin tube (catheter) into each artery. Tell your health care provider about any allergies you have, including allergies to contrast dye. After the procedure, you will need to lie still for a few hours and drink plenty of fluids. This information is not intended to replace  advice given to you by your health care provider. Make sure you discuss any questions you have with your health care provider. Document Revised: 01/12/2019 Document Reviewed: 01/12/2019 Elsevier Patient Education  2020 [employer]. Femoral Site Care This sheet gives you information about how to care for yourself after your procedure. Your health care provider may also give you more specific instructions. If you have problems or questions, contact your health care provider. What can I expect after the procedure? After the procedure, it is common to have:  Bruising that usually fades within 1-2 weeks.  Tenderness at the site. Follow these instructions at home: Wound care  Follow instructions from your health care provider about how to take care of your insertion site. Make sure you: ? Wash your hands with soap and water before you change your bandage (dressing). If soap and water are not available, use hand sanitizer. ? Change your dressing as told by your health care provider. ? Leave stitches (sutures), skin glue, or adhesive strips in place. These skin closures may need to stay in place for 2 weeks or longer. If adhesive strip edges start to loosen and curl up, you may trim the loose edges. Do not remove adhesive strips completely unless your health care provider tells you to do that.  Do not take baths, swim,   or use a hot tub until your health care provider approves.  You may shower 24-48 hours after the procedure or as told by your health care provider. ? Gently wash the site with plain soap and water. ? Pat the area dry with a clean towel. ? Do not rub the site. This may cause bleeding.  Do not apply powder or lotion to the site. Keep the site clean and dry.  Check your femoral site every day for signs of infection. Check for: ? Redness, swelling, or pain. ? Fluid or blood. ? Warmth. ? Pus or a bad smell. Activity  For the first 2-3 days after your procedure, or as long as  directed: ? Avoid climbing stairs as much as possible. ? Do not squat.  Do not lift anything that is heavier than 10 lb (4.5 kg), or the limit that you are told, until your health care provider says that it is safe.  Rest as directed. ? Avoid sitting for a long time without moving. Get up to take short walks every 1-2 hours.  Do not drive for 24 hours if you were given a medicine to help you relax (sedative). General instructions  Take over-the-counter and prescription medicines only as told by your health care provider.  Keep all follow-up visits as told by your health care provider. This is important. Contact a health care provider if you have:  A fever or chills.  You have redness, swelling, or pain around your insertion site. Get help right away if:  The catheter insertion area swells very fast.  You pass out.  You suddenly start to sweat or your skin gets clammy.  The catheter insertion area is bleeding, and the bleeding does not stop when you hold steady pressure on the area.  The area near or just beyond the catheter insertion site becomes pale, cool, tingly, or numb. These symptoms may represent a serious problem that is an emergency. Do not wait to see if the symptoms will go away. Get medical help right away. Call your local emergency services (911 in the [place] Do not drive yourself to the hospital. Summary  After the procedure, it is common to have bruising that usually fades within 1-2 weeks.  Check your femoral site every day for signs of infection.  Do not lift anything that is heavier than 10 lb (4.5 kg), or the limit that you are told, until your health care provider says that it is safe. This information is not intended to replace advice given to you by your health care provider. Make sure you discuss any questions you have with your health care provider. Document Revised: 07/05/2017 Document Reviewed: 07/05/2017 Elsevier Patient Education  2020 Elsevier  Inc. Moderate Conscious Sedation, Adult, Care After These instructions provide you with information about caring for yourself after your procedure. Your health care provider may also give you more specific instructions. Your treatment has been planned according to current medical practices, but problems sometimes occur. Call your health care provider if you have any problems or questions after your procedure. What can I expect after the procedure? After your procedure, it is common:  To feel sleepy for several hours.  To feel clumsy and have poor balance for several hours.  To have poor judgment for several hours.  To vomit if you eat too soon. Follow these instructions at home: For at least 24 hours after the procedure:   Do not: ? Participate in activities where you could fall or become injured. ?   Drive. ? Use heavy machinery. ? Drink alcohol. ? Take sleeping pills or medicines that cause drowsiness. ? Make important decisions or sign legal documents. ? Take care of children on your own.  Rest. Eating and drinking  Follow the diet recommended by your health care provider.  If you vomit: ? Drink water, juice, or soup when you can drink without vomiting. ? Make sure you have little or no nausea before eating solid foods. General instructions  Have a responsible adult stay with you until you are awake and alert.  Take over-the-counter and prescription medicines only as told by your health care provider.  If you smoke, do not smoke without supervision.  Keep all follow-up visits as told by your health care provider. This is important. Contact a health care provider if:  You keep feeling nauseous or you keep vomiting.  You feel light-headed.  You develop a rash.  You have a fever. Get help right away if:  You have trouble breathing. This information is not intended to replace advice given to you by your health care provider. Make sure you discuss any questions you  have with your health care provider. Document Revised: 06/04/2017 Document Reviewed: 10/12/2015 Elsevier Patient Education  2020 Elsevier Inc.  

## 2019-11-16 NOTE — Progress Notes (Signed)
5 french Arterial Sheath and 7 french venus Sheath pulled and pressure held for 15 minutes each. Hemostasis achieved. No signs of bleeding or hematoma noted. Patient tolerated well. Vital signs WNL

## 2019-11-17 NOTE — H&P (Signed)
H&P Addendum, precardiac catheterization  Patient was seen and evaluated prior to Cardiac catheterization procedure Symptoms, prior testing details again confirmed with the patient Patient examined, no significant change from prior exam Lab work reviewed in detail personally by myself Patient understands risk and benefit of the procedure, willing to proceed  Signed, Tim Berneita Sanagustin, MD, Ph.D [facility]    

## 2019-11-20 ENCOUNTER — Ambulatory Visit (INDEPENDENT_AMBULATORY_CARE_PROVIDER_SITE_OTHER): Payer: Medicare Other | Admitting: Gastroenterology

## 2019-11-20 DIAGNOSIS — R1310 Dysphagia, unspecified: Secondary | ICD-10-CM | POA: Diagnosis not present

## 2019-11-20 MED ORDER — OMEPRAZOLE 40 MG PO CPDR: 40.0000 mg | DELAYED_RELEASE_CAPSULE | Freq: Two times a day (BID) | ORAL | 3 refills | Status: AC

## 2019-11-20 NOTE — Progress Notes (Signed)
Kevin Pearson , MD [address]  [place]  [place], [place] [address]  Main: [phone]  Fax: [phone]   Primary Care Physician: Dionicio Stall, MD  Virtual Visit via Telephone Note  I connected with patient on 11/20/19 at  3:15 PM EDT by telephone and verified that I am speaking with the correct person using two identifiers.   I discussed the limitations, risks, security and privacy concerns of performing an evaluation and management service by telephone and the availability of in person appointments. I also discussed with the patient that there may be a patient responsible charge related to this service. The patient expressed understanding and agreed to proceed.  Location of Patient: Home Location of Provider: Home Persons involved: Patient and provider only   History of Present Illness:  Dysphagia follow up   HPI: Kevin Pearson is a 69 y.o. male   Summary of history :  I last performed an EGD in 04/2019 for dysphagia and ran a balloon dilator all the way through his esophagus dilated to 49 F, no clear stricture was noted. He has a hiatal hernia .  He says did great for 2 months then dysphagia for solids returned, food gets stuck below adams apple, no coughing.Was seen for the same on 08/04/2019 08/10/2019: Barium swallow.  Cervical esophagus is normal.  Mild laryngeal penetration may be present.  Thoracic esophagus is widely patent no evidence of stricture or focal obstruction.  Small hiatal hernia with mild reflux noted    Interval history  09/05/2019-11/20/2019 09/18/2019: Modified barium swallow: Mild oropharyngeal dysphagia.  Mid esophageal retention.  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 He says that he still has issues with swallowing.  Omeprazole is taken once a day in the morning.  Current Outpatient Medications  Medication Sig Dispense Refill  . acetaminophen (TYLENOL) 325 MG tablet Take 650 mg by mouth every 6  (six) hours as needed for mild pain or headache.     . albuterol (VENTOLIN HFA) 108 (90 Base) MCG/ACT inhaler Inhale 2 puffs into the lungs every 6 (six) hours as needed for wheezing or shortness of breath. 18 g 1  . busPIRone (BUSPAR) 10 MG tablet Take 10 mg by mouth 3 (three) times daily.    . Carboxymethylcell-Glycerin PF (REFRESH OPTIVE PF) 0.5-0.9 % SOLN Place 1 drop into both eyes daily as needed (Dry eye).    Marland Kitchen clopidogrel (PLAVIX) 75 MG tablet Take 75 mg by mouth daily.    . divalproex (DEPAKOTE) 250 MG DR tablet Take 750 mg by mouth 2 (two) times daily.     Marland Kitchen gabapentin (NEURONTIN) 300 MG capsule Take 600 mg by mouth 2 (two) times daily.     . Melatonin 5 MG TABS Take 5 mg by mouth at bedtime.     . metoprolol succinate (TOPROL-XL) 50 MG 24 hr tablet TAKE 1 TABLET BY MOUTH EVERY DAY WITH OR IMMEDIATELY AFTER MEAL (Patient taking differently: Take 50 mg by mouth daily. OR IMMEDIATELY AFTER MEAL) 90 tablet 3  . Multiple Vitamin (MULTIVITAMIN) tablet Take 1 tablet by mouth daily.    . nitroGLYCERIN (NITROSTAT) 0.4 MG SL tablet Place 0.4 mg under the tongue every 5 (five) minutes as needed for chest pain.    Marland Kitchen omeprazole (PRILOSEC) 40 MG capsule Take 40 mg by mouth daily.    . rivaroxaban (XARELTO) 20 MG TABS tablet Take 20 mg by mouth daily with supper.     . rosuvastatin (CRESTOR) 20 MG  tablet Take 20 mg by mouth at bedtime.     . senna (SENOKOT) 8.6 MG tablet Take 2 tablets by mouth 2 (two) times daily.     . tamsulosin (FLOMAX) 0.4 MG CAPS capsule TAKE ONE CAPSULE BY MOUTH EVERY DAY AFTER SUPPER (Patient taking differently: Take 0.4 mg by mouth daily. ) 30 capsule 11  . torsemide (DEMADEX) 20 MG tablet TAKE 2 TABLETS BY MOUTH TWICE A DAY (Patient taking differently: Take 40 mg by mouth daily. ) 360 tablet 0  . traMADol (ULTRAM) 50 MG tablet Take 50 mg by mouth every 6 (six) hours as needed for moderate pain.     . traZODone (DESYREL) 100 MG tablet Take 100 mg by mouth at bedtime as needed  for sleep.      No current facility-administered medications for this visit.    Allergies as of 11/20/2019 - Review Complete 11/16/2019  Allergen Reaction Noted  . 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 Other Anaphylaxis, Itching, and Other (See Comments) 06/24/2016  . Keflex [cephalexin] Nausea Only 03/16/2019  . Codeine Nausea And Vomiting 06/18/2016    Review of Systems:    All systems reviewed and negative except where noted in HPI.   Observations/Objective:  Labs: CMP     Component Value Date/Time   NA 140 11/13/2019 1633   NA 142 01/24/2019 1119   K 3.3 (L) 11/13/2019 1633   CL 102 11/13/2019 1633   CO2 26 11/13/2019 1633   GLUCOSE 90 11/13/2019 1633   BUN 20 11/13/2019 1633   BUN 18 01/24/2019 1119   CREATININE 1.52 (H) 11/13/2019 1633   CALCIUM 9.6 11/13/2019 1633   PROT 7.1 01/24/2019 1119   ALBUMIN 4.3 01/24/2019 1119   AST 21 01/24/2019 1119   ALT 19 01/24/2019 1119   ALKPHOS 51 01/24/2019 1119   BILITOT 0.4 01/24/2019 1119   GFRNONAA 47 (L) 11/13/2019 1633   GFRAA 54 (L) 11/13/2019 1633   Lab Results  Component Value Date   WBC 7.0 11/13/2019   HGB 13.9 11/13/2019   HCT 39.8 11/13/2019   MCV 89.8 11/13/2019   PLT 214 11/13/2019    Imaging Studies: CARDIAC CATHETERIZATION  Addendum Date: 11/16/2019    Prox LAD lesion is 30% stenosed.  Mid Cx lesion is 100% stenosed.  Prox Cx to Mid Cx lesion is 40% stenosed.  Mid RCA lesion is 40% stenosed.  Mid RCA to Dist RCA lesion is 40% stenosed.  Prox RCA lesion is 10% stenosed.  The left ventricular ejection fraction is 35-45% by visual estimate.  LV end diastolic pressure is normal.  There is moderate left ventricular systolic dysfunction.  LV end diastolic pressure is normal.    Result Date: 11/16/2019  Prox LAD lesion is 30% stenosed.  Mid Cx lesion is 100% stenosed.  Prox Cx to Mid Cx lesion is 40% stenosed.  Mid RCA lesion is 40% stenosed.  Mid RCA to Dist RCA lesion is 40% stenosed.   Prox RCA lesion is 10% stenosed.  The left ventricular ejection fraction is 35-45% by visual estimate.  LV end diastolic pressure is normal.  There is moderate left ventricular systolic dysfunction.  LV end diastolic pressure is normal.    NM Myocar Multi W/Spect W/Wall Motion / EF  Result Date: 11/03/2019 Pharmacological myocardial perfusion imaging study with moderate-sized region of ischemia in the mid to apical inferior wall GI uptake artifact noted basal inferior region Inferior wall hypokinesis, EF estimated at 51% No EKG changes concerning for ischemia at  peak stress or in recovery. CT attenuation correction images with moderate aortic atherosclerosis, extensive coronary calcification Moderate to high risk scan Signed, Dossie Arbour, MD, Ph.D [facility]   CUP PACEART REMOTE DEVICE CHECK  Result Date: 11/10/2019 Scheduled remote reviewed. Normal device function.  One NSVT (219 bpm x13 beats). Bi-v pacing 89%. Next remote 91 days. Salomon Fick, RN, MSN   Assessment and Plan:   Kevin Pearson is a 69 y.o. y/o male here to follow up for dysphagia for solids.  Barium swallow did not show any clear strictures.  There was some laryngeal penetration of the barium and a hiatal hernia with mild reflux was noted.   There is definitely no obstruction seen.    Modified barium swallow showed mild oropharyngeal dysphagia, laryngopharyngeal reflux.  Plan :  1. Continue PPI increase to 40 mg bid .  Small bites well chewed swallows with lots of liquid.  Eating sitting up right.  Avoid lying flat for 2 hours after meal. If no better then add pepcid at night.  If that fails repeat empirical dilation could be attempted although I am skeptical about the benefit that we may receive   I discussed the assessment and treatment plan with the patient. The patient was provided an opportunity to ask questions and all were answered. The patient agreed with the plan and demonstrated an understanding of the  instructions.   The patient was advised to call back or seek an in-person evaluation if the symptoms worsen or if the condition fails to improve as anticipated.  I provided 15 minutes of non-face-to-face time during this encounter.  Dr Wyline Mood MD,MRCP [facility]) Gastroenterology/Hepatology Pager: [phone]   Speech recognition software was used to dictate this note.

## 2019-11-21 ENCOUNTER — Other Ambulatory Visit: Payer: Self-pay | Admitting: Internal Medicine

## 2019-11-22 ENCOUNTER — Other Ambulatory Visit: Payer: Self-pay

## 2019-11-22 ENCOUNTER — Encounter: Payer: Self-pay | Admitting: *Deleted

## 2019-11-22 ENCOUNTER — Encounter: Payer: Medicare Other | Attending: Cardiovascular Disease | Admitting: *Deleted

## 2019-11-22 DIAGNOSIS — Z79899 Other long term (current) drug therapy: Secondary | ICD-10-CM | POA: Insufficient documentation

## 2019-11-22 DIAGNOSIS — I480 Paroxysmal atrial fibrillation: Secondary | ICD-10-CM | POA: Insufficient documentation

## 2019-11-22 DIAGNOSIS — I208 Other forms of angina pectoris: Secondary | ICD-10-CM | POA: Insufficient documentation

## 2019-11-22 DIAGNOSIS — F419 Anxiety disorder, unspecified: Secondary | ICD-10-CM | POA: Insufficient documentation

## 2019-11-22 DIAGNOSIS — Z7901 Long term (current) use of anticoagulants: Secondary | ICD-10-CM | POA: Insufficient documentation

## 2019-11-22 DIAGNOSIS — I509 Heart failure, unspecified: Secondary | ICD-10-CM | POA: Insufficient documentation

## 2019-11-22 DIAGNOSIS — Z87891 Personal history of nicotine dependence: Secondary | ICD-10-CM | POA: Insufficient documentation

## 2019-11-22 DIAGNOSIS — Z7902 Long term (current) use of antithrombotics/antiplatelets: Secondary | ICD-10-CM | POA: Insufficient documentation

## 2019-11-22 DIAGNOSIS — F329 Major depressive disorder, single episode, unspecified: Secondary | ICD-10-CM | POA: Insufficient documentation

## 2019-11-22 DIAGNOSIS — I110 Hypertensive heart disease with heart failure: Secondary | ICD-10-CM | POA: Insufficient documentation

## 2019-11-22 DIAGNOSIS — Z87820 Personal history of traumatic brain injury: Secondary | ICD-10-CM | POA: Insufficient documentation

## 2019-11-22 DIAGNOSIS — J449 Chronic obstructive pulmonary disease, unspecified: Secondary | ICD-10-CM | POA: Insufficient documentation

## 2019-11-22 NOTE — Progress Notes (Signed)
Virtual Orientation call completed with Kevin Pearson today. He has appt on 5/24 for the EP Eval and gym orientation. Diagnosis documentation can be found in [facility] 11/13/19 office visit

## 2019-11-23 ENCOUNTER — Other Ambulatory Visit: Payer: Self-pay

## 2019-11-23 MED ORDER — MAGNESIUM OXIDE 400 MG PO CAPS: 400.0000 mg | ORAL_CAPSULE | Freq: Every day | ORAL | 3 refills | Status: AC

## 2019-11-27 ENCOUNTER — Encounter: Payer: Medicare Other | Admitting: *Deleted

## 2019-11-27 ENCOUNTER — Other Ambulatory Visit: Payer: Self-pay

## 2019-11-27 VITALS — Ht 67.2 in | Wt 228.9 lb

## 2019-11-27 DIAGNOSIS — I208 Other forms of angina pectoris: Secondary | ICD-10-CM | POA: Diagnosis not present

## 2019-11-27 DIAGNOSIS — Z7902 Long term (current) use of antithrombotics/antiplatelets: Secondary | ICD-10-CM | POA: Diagnosis not present

## 2019-11-27 DIAGNOSIS — F419 Anxiety disorder, unspecified: Secondary | ICD-10-CM | POA: Diagnosis not present

## 2019-11-27 DIAGNOSIS — F329 Major depressive disorder, single episode, unspecified: Secondary | ICD-10-CM | POA: Diagnosis not present

## 2019-11-27 DIAGNOSIS — Z79899 Other long term (current) drug therapy: Secondary | ICD-10-CM | POA: Diagnosis not present

## 2019-11-27 DIAGNOSIS — Z7901 Long term (current) use of anticoagulants: Secondary | ICD-10-CM | POA: Diagnosis not present

## 2019-11-27 DIAGNOSIS — J449 Chronic obstructive pulmonary disease, unspecified: Secondary | ICD-10-CM | POA: Diagnosis not present

## 2019-11-27 DIAGNOSIS — Z87891 Personal history of nicotine dependence: Secondary | ICD-10-CM | POA: Diagnosis not present

## 2019-11-27 DIAGNOSIS — I480 Paroxysmal atrial fibrillation: Secondary | ICD-10-CM | POA: Diagnosis not present

## 2019-11-27 DIAGNOSIS — I110 Hypertensive heart disease with heart failure: Secondary | ICD-10-CM | POA: Diagnosis not present

## 2019-11-27 DIAGNOSIS — I509 Heart failure, unspecified: Secondary | ICD-10-CM | POA: Diagnosis not present

## 2019-11-27 DIAGNOSIS — Z87820 Personal history of traumatic brain injury: Secondary | ICD-10-CM | POA: Diagnosis not present

## 2019-11-27 NOTE — Patient Instructions (Signed)
Patient Instructions  Patient Details  Name: Kevin Pearson MRN: [id number] Date of Birth: 22-Jan-1951 Referring Provider:  Antonieta Iba, MD  Below are your personal goals for exercise, nutrition, and risk factors. Our goal is to help you stay on track towards obtaining and maintaining these goals. We will be discussing your progress on these goals with you throughout the program.  Initial Exercise Prescription: Initial Exercise Prescription - 11/27/19 1300      Date of Initial Exercise RX and Referring Provider   Date  11/27/19    Referring Provider  Julien Nordmann MD      Treadmill   MPH  1.5    Grade  0.5    Minutes  15    METs  2.25      Recumbant Bike   Level  1    RPM  50    Watts  5    Minutes  15    METs  2      NuStep   Level  1    SPM  80    Minutes  15    METs  2      REL-XR   Level  1    Speed  50    Minutes  15    METs  2      Prescription Details   Frequency (times per week)  2    Duration  Progress to 30 minutes of continuous aerobic without signs/symptoms of physical distress      Intensity   THRR 40-80% of Max Heartrate  106-137    Ratings of Perceived Exertion  11-13    Perceived Dyspnea  0-4      Progression   Progression  Continue to progress workloads to maintain intensity without signs/symptoms of physical distress.      Resistance Training   Training Prescription  Yes    Weight  3 lb    Reps  10-15       Exercise Goals: Frequency: Be able to perform aerobic exercise two to three times per week in program working toward 2-5 days per week of home exercise.  Intensity: Work with a perceived exertion of 11 (fairly light) - 15 (hard) while following your exercise prescription.  We will make changes to your prescription with you as you progress through the program.   Duration: Be able to do 30 to 45 minutes of continuous aerobic exercise in addition to a 5 minute warm-up and a 5 minute cool-down routine.   Nutrition Goals: Your  personal nutrition goals will be established when you do your nutrition analysis with the dietician.  The following are general nutrition guidelines to follow: Cholesterol < 200mg /day Sodium < 1500mg /day Fiber: Men over 50 yrs - 30 grams per day  Personal Goals: Personal Goals and Risk Factors at Admission - 11/27/19 1323      Core Components/Risk Factors/Patient Goals on Admission    Weight Management  Yes;Weight Loss;Obesity    Intervention  Weight Management: Develop a combined nutrition and exercise program designed to reach desired caloric intake, while maintaining appropriate intake of nutrient and fiber, sodium and fats, and appropriate energy expenditure required for the weight goal.;Weight Management/Obesity: Establish reasonable short term and long term weight goals.;Weight Management: Provide education and appropriate resources to help participant work on and attain dietary goals.;Obesity: Provide education and appropriate resources to help participant work on and attain dietary goals.    Admit Weight  228 lb 14.4 oz (103.8 kg)  Goal Weight: Short Term  223 lb (101.2 kg)    Goal Weight: Long Term  200 lb (90.7 kg)    Expected Outcomes  Short Term: Continue to assess and modify interventions until short term weight is achieved;Long Term: Adherence to nutrition and physical activity/exercise program aimed toward attainment of established weight goal;Weight Loss: Understanding of general recommendations for a balanced deficit meal plan, which promotes 1-2 lb weight loss per week and includes a negative energy balance of [phone] kcal/d;Understanding recommendations for meals to include 15-35% energy as protein, 25-35% energy from fat, 35-60% energy from carbohydrates, less than 200mg  of dietary cholesterol, 20-35 gm of total fiber daily;Understanding of distribution of calorie intake throughout the day with the consumption of 4-5 meals/snacks    Improve shortness of breath with ADL's  Yes     Intervention  Provide education, individualized exercise plan and daily activity instruction to help decrease symptoms of SOB with activities of daily living.    Expected Outcomes  Short Term: Improve cardiorespiratory fitness to achieve a reduction of symptoms when performing ADLs;Long Term: Be able to perform more ADLs without symptoms or delay the onset of symptoms    Heart Failure  Yes    Intervention  Provide a combined exercise and nutrition program that is supplemented with education, support and counseling about heart failure. Directed toward relieving symptoms such as shortness of breath, decreased exercise tolerance, and extremity edema.    Expected Outcomes  Improve functional capacity of life;Short term: Attendance in program 2-3 days a week with increased exercise capacity. Reported lower sodium intake. Reported increased fruit and vegetable intake. Reports medication compliance.;Short term: Daily weights obtained and reported for increase. Utilizing diuretic protocols set by physician.;Long term: Adoption of self-care skills and reduction of barriers for early signs and symptoms recognition and intervention leading to self-care maintenance.    Hypertension  Yes    Intervention  Provide education on lifestyle modifcations including regular physical activity/exercise, weight management, moderate sodium restriction and increased consumption of fresh fruit, vegetables, and low fat dairy, alcohol moderation, and smoking cessation.;Monitor prescription use compliance.    Expected Outcomes  Short Term: Continued assessment and intervention until BP is < 140/18mm HG in hypertensive participants. < 130/48mm HG in hypertensive participants with diabetes, heart failure or chronic kidney disease.;Long Term: Maintenance of blood pressure at goal levels.    Lipids  Yes    Intervention  Provide education and support for participant on nutrition & aerobic/resistive exercise along with prescribed  medications to achieve LDL 70mg , HDL >40mg .    Expected Outcomes  Short Term: Participant states understanding of desired cholesterol values and is compliant with medications prescribed. Participant is following exercise prescription and nutrition guidelines.;Long Term: Cholesterol controlled with medications as prescribed, with individualized exercise RX and with personalized nutrition plan. Value goals: LDL < 70mg , HDL > 40 mg.       Tobacco Use Initial Evaluation: Social History   Tobacco Use  Smoking Status Former Smoker  . Packs/day: 1.00  . Years: 40.00  . Pack years: 40.00  . Types: Cigarettes  . Quit date: 08/2015  . Years since quitting: 4.3  Smokeless Tobacco Never Used  Tobacco Comment   Quit 2017      Exercise Goals and Review: Exercise Goals    Row Name 11/27/19 1310             Exercise Goals   Increase Physical Activity  Yes       Intervention  Provide advice, education,  support and counseling about physical activity/exercise needs.;Develop an individualized exercise prescription for aerobic and resistive training based on initial evaluation findings, risk stratification, comorbidities and participant's personal goals.       Expected Outcomes  Short Term: Attend rehab on a regular basis to increase amount of physical activity.;Long Term: Add in home exercise to make exercise part of routine and to increase amount of physical activity.;Long Term: Exercising regularly at least 3-5 days a week.       Able to understand and use rate of perceived exertion (RPE) scale  Yes       Intervention  Provide education and explanation on how to use RPE scale       Expected Outcomes  Short Term: Able to use RPE daily in rehab to express subjective intensity level;Long Term:  Able to use RPE to guide intensity level when exercising independently       Able to understand and use Dyspnea scale  Yes       Intervention  Provide education and explanation on how to use Dyspnea scale        Expected Outcomes  Short Term: Able to use Dyspnea scale daily in rehab to express subjective sense of shortness of breath during exertion;Long Term: Able to use Dyspnea scale to guide intensity level when exercising independently       Knowledge and understanding of Target Heart Rate Range (THRR)  Yes       Intervention  Provide education and explanation of THRR including how the numbers were predicted and where they are located for reference       Expected Outcomes  Short Term: Able to state/look up THRR;Long Term: Able to use THRR to govern intensity when exercising independently;Short Term: Able to use daily as guideline for intensity in rehab       Able to check pulse independently  Yes       Intervention  Provide education and demonstration on how to check pulse in carotid and radial arteries.;Review the importance of being able to check your own pulse for safety during independent exercise       Expected Outcomes  Short Term: Able to explain why pulse checking is important during independent exercise;Long Term: Able to check pulse independently and accurately       Understanding of Exercise Prescription  Yes       Intervention  Provide education, explanation, and written materials on patient's individual exercise prescription       Expected Outcomes  Short Term: Able to explain program exercise prescription;Long Term: Able to explain home exercise prescription to exercise independently          Copy of goals given to participant.

## 2019-11-27 NOTE — Progress Notes (Signed)
Cardiac Individual Treatment Plan  Patient Details  Name: Kevin Pearson MRN: [id number] Date of Birth: [birth year] Referring Provider:     Cardiac Rehab from 11/27/2019 in [facility] Cardiac and Pulmonary Rehab  Referring Provider  Ida Rogue MD      Initial Encounter Date:    Cardiac Rehab from 11/27/2019 in [facility] Cardiac and Pulmonary Rehab  Date  11/27/19      Visit Diagnosis: Chronic stable angina ([place])  Patient's Home Medications on Admission:  Current Outpatient Medications:  .  acetaminophen (TYLENOL) 325 MG tablet, Take 650 mg by mouth every 6 (six) hours as needed for mild pain or headache. , Disp: , Rfl:  .  albuterol (VENTOLIN HFA) 108 (90 Base) MCG/ACT inhaler, Inhale 2 puffs into the lungs every 6 (six) hours as needed for wheezing or shortness of breath., Disp: 18 g, Rfl: 1 .  busPIRone (BUSPAR) 10 MG tablet, Take 10 mg by mouth 3 (three) times daily., Disp: , Rfl:  .  Carboxymethylcell-Glycerin PF (REFRESH OPTIVE PF) 0.5-0.9 % SOLN, Place 1 drop into both eyes daily as needed (Dry eye)., Disp: , Rfl:  .  clopidogrel (PLAVIX) 75 MG tablet, Take 75 mg by mouth daily., Disp: , Rfl:  .  divalproex (DEPAKOTE) 250 MG DR tablet, Take 750 mg by mouth 2 (two) times daily. , Disp: , Rfl:  .  gabapentin (NEURONTIN) 300 MG capsule, Take 600 mg by mouth 2 (two) times daily. , Disp: , Rfl:  .  Magnesium Oxide 400 MG CAPS, Take 1 capsule (400 mg total) by mouth daily., Disp: 90 capsule, Rfl: 3 .  Melatonin 5 MG TABS, Take 5 mg by mouth at bedtime. , Disp: , Rfl:  .  metoprolol succinate (TOPROL-XL) 50 MG 24 hr tablet, TAKE 1 TABLET BY MOUTH EVERY DAY WITH OR IMMEDIATELY AFTER MEAL (Patient taking differently: Take 50 mg by mouth daily. OR IMMEDIATELY AFTER MEAL), Disp: 90 tablet, Rfl: 3 .  Multiple Vitamin (MULTIVITAMIN) tablet, Take 1 tablet by mouth daily., Disp: , Rfl:  .  nitroGLYCERIN (NITROSTAT) 0.4 MG SL tablet, Place 0.4 mg under the tongue every 5 (five) minutes as needed for  chest pain., Disp: , Rfl:  .  omeprazole (PRILOSEC) 40 MG capsule, Take 1 capsule (40 mg total) by mouth 2 (two) times daily., Disp: 90 capsule, Rfl: 3 .  rivaroxaban (XARELTO) 20 MG TABS tablet, Take 20 mg by mouth daily with supper. , Disp: , Rfl:  .  rosuvastatin (CRESTOR) 20 MG tablet, Take 20 mg by mouth at bedtime. , Disp: , Rfl:  .  senna (SENOKOT) 8.6 MG tablet, Take 2 tablets by mouth 2 (two) times daily. , Disp: , Rfl:  .  tamsulosin (FLOMAX) 0.4 MG CAPS capsule, TAKE ONE CAPSULE BY MOUTH EVERY DAY AFTER SUPPER (Patient taking differently: Take 0.4 mg by mouth daily. ), Disp: 30 capsule, Rfl: 11 .  torsemide (DEMADEX) 20 MG tablet, TAKE 2 TABLETS BY MOUTH TWICE A DAY (Patient taking differently: Take 40 mg by mouth daily. ), Disp: 360 tablet, Rfl: 0 .  traMADol (ULTRAM) 50 MG tablet, Take 50 mg by mouth every 6 (six) hours as needed for moderate pain. , Disp: , Rfl:  .  traZODone (DESYREL) 100 MG tablet, Take 100 mg by mouth at bedtime as needed for sleep. , Disp: , Rfl:   Past Medical History: Past Medical History:  Diagnosis Date  . Acid reflux   . Anxiety   . Arthritis   . Atrial fibrillation ([place])   .  CHF (congestive heart failure) ([place])   . Chronic orthostatic hypotension   . Clotting disorder ([place])   . COPD (chronic obstructive pulmonary disease) ([place])   . Depression   . Elevated PSA   . Heart attack ([place])   . Heart disease   . Heart failure ([place])   . Hepatitis   . High cholesterol   . Hypertension   . Sleep apnea   . TBI (traumatic brain injury) ([place])   . Urinary retention     Tobacco Use: Social History   Tobacco Use  Smoking Status Former Smoker  . Packs/day: 1.00  . Years: 40.00  . Pack years: 40.00  . Types: Cigarettes  . Quit date: 08/2015  . Years since quitting: 4.3  Smokeless Tobacco Never Used  Tobacco Comment   Quit 2017      Labs: Recent Review Flowsheet Data    Labs for ITP Cardiac and Pulmonary Rehab Latest Ref Rng & Units 11/23/2017    Cholestrol 100 - 199 mg/dL 114   LDLCALC 0 - 99 mg/dL 60   HDL >39 mg/dL 30(L)   Trlycerides 0 - 149 mg/dL 119       Exercise Target Goals: Exercise Program Goal: Individual exercise prescription set using results from initial 6 min walk test and THRR while considering  patient's activity barriers and safety.   Exercise Prescription Goal: Initial exercise prescription builds to 30-45 minutes a day of aerobic activity, 2-3 days per week.  Home exercise guidelines will be given to patient during program as part of exercise prescription that the participant will acknowledge.   Education: Aerobic Exercise & Resistance Training: - Gives group verbal and written instruction on the various components of exercise. Focuses on aerobic and resistive training programs and the benefits of this training and how to safely progress through these programs..   Cardiac Rehab from 03/24/2018 in [facility] Cardiac and Pulmonary Rehab  Date  02/10/18  Educator  AS  Instruction Review Code  1- Verbalizes Understanding      Education: Exercise & Equipment Safety: - Individual verbal instruction and demonstration of equipment use and safety with use of the equipment.   Cardiac Rehab from 11/27/2019 in [facility] Cardiac and Pulmonary Rehab  Date  11/27/19  Educator  [facility]  Instruction Review Code  1- Verbalizes Understanding      Education: Exercise Physiology & General Exercise Guidelines: - Group verbal and written instruction with models to review the exercise physiology of the cardiovascular system and associated critical values. Provides general exercise guidelines with specific guidelines to those with heart or lung disease.    Cardiac Rehab from 03/24/2018 in [facility] Cardiac and Pulmonary Rehab  Date  02/01/18  Educator  [facility]  Instruction Review Code  1- Verbalizes Understanding      Education: Flexibility, Balance, Mind/Body Relaxation: Provides group verbal/written instruction on the benefits of flexibility and  balance training, including mind/body exercise modes such as yoga, pilates and tai chi.  Demonstration and skill practice provided.   Cardiac Rehab from 03/24/2018 in [facility] Cardiac and Pulmonary Rehab  Date  12/23/17  Educator  AS  Instruction Review Code  1- Verbalizes Understanding      Activity Barriers & Risk Stratification: Activity Barriers & Cardiac Risk Stratification - 11/27/19 1258      Activity Barriers & Cardiac Risk Stratification   Activity Barriers  Shortness of Breath;Back Problems;Joint Problems;Decreased Ventricular Function;Deconditioning;History of Falls;Balance Concerns;Neck/Spine Problems;Muscular Weakness    Cardiac Risk Stratification  High  6 Minute Walk: 6 Minute Walk    Row Name 11/27/19 1257         6 Minute Walk   Phase  Initial     Distance  1045 feet     Walk Time  6 minutes     MPH  1.98     RPE  13     Perceived Dyspnea   3     VO2 Peak  7.48     Symptoms  Yes (comment)     Comments  SOB, neuropathy     Resting HR  75 bpm     Resting BP  146/64     Resting Oxygen Saturation   99 %     Exercise Oxygen Saturation  during 6 min walk  99 %     Max Ex. HR  103 bpm     Max Ex. BP  126/64     2 Minute Post BP  126/64        Oxygen Initial Assessment: Oxygen Initial Assessment - 11/22/19 1113      Home Oxygen   Home Oxygen Device  Home Concentrator    Sleep Oxygen Prescription  Continuous    Liters per minute  2    Home Exercise Oxygen Prescription  None    Home at Rest Exercise Oxygen Prescription  None    Compliance with Home Oxygen Use  Yes      Intervention   Short Term Goals  To learn and understand importance of monitoring SPO2 with pulse oximeter and demonstrate accurate use of the pulse oximeter.;To learn and understand importance of maintaining oxygen saturations>88%;To learn and demonstrate proper pursed lip breathing techniques or other breathing techniques.    Long  Term Goals  Exhibits compliance with exercise, home  and travel O2 prescription;Verbalizes importance of monitoring SPO2 with pulse oximeter and return demonstration;Maintenance of O2 saturations>88%;Exhibits proper breathing techniques, such as pursed lip breathing or other method taught during program session       Oxygen Re-Evaluation:   Oxygen Discharge (Final Oxygen Re-Evaluation):   Initial Exercise Prescription: Initial Exercise Prescription - 11/27/19 1300      Date of Initial Exercise RX and Referring Provider   Date  11/27/19    Referring Provider  Ida Rogue MD      Treadmill   MPH  1.5    Grade  0.5    Minutes  15    METs  2.25      Recumbant Bike   Level  1    RPM  50    Watts  5    Minutes  15    METs  2      NuStep   Level  1    SPM  80    Minutes  15    METs  2      REL-XR   Level  1    Speed  50    Minutes  15    METs  2      Prescription Details   Frequency (times per week)  2    Duration  Progress to 30 minutes of continuous aerobic without signs/symptoms of physical distress      Intensity   THRR 40-80% of Max Heartrate  106-137    Ratings of Perceived Exertion  11-13    Perceived Dyspnea  0-4      Progression   Progression  Continue to progress workloads to maintain intensity without signs/symptoms of physical distress.  Resistance Training   Training Prescription  Yes    Weight  3 lb    Reps  10-15       Perform Capillary Blood Glucose checks as needed.  Exercise Prescription Changes: Exercise Prescription Changes    Row Name 11/27/19 1300             Response to Exercise   Blood Pressure (Admit)  146/64       Blood Pressure (Exercise)  126/64       Blood Pressure (Exit)  126/64       Heart Rate (Admit)  75 bpm       Heart Rate (Exercise)  103 bpm       Heart Rate (Exit)  75 bpm       Oxygen Saturation (Admit)  99 %       Oxygen Saturation (Exercise)  99 %       Rating of Perceived Exertion (Exercise)  13       Perceived Dyspnea (Exercise)  3       Symptoms   SOB       Comments  walk test results          Exercise Comments:   Exercise Goals and Review: Exercise Goals    Row Name 11/27/19 1310             Exercise Goals   Increase Physical Activity  Yes       Intervention  Provide advice, education, support and counseling about physical activity/exercise needs.;Develop an individualized exercise prescription for aerobic and resistive training based on initial evaluation findings, risk stratification, comorbidities and participant's personal goals.       Expected Outcomes  Short Term: Attend rehab on a regular basis to increase amount of physical activity.;Long Term: Add in home exercise to make exercise part of routine and to increase amount of physical activity.;Long Term: Exercising regularly at least 3-5 days a week.       Able to understand and use rate of perceived exertion (RPE) scale  Yes       Intervention  Provide education and explanation on how to use RPE scale       Expected Outcomes  Short Term: Able to use RPE daily in rehab to express subjective intensity level;Long Term:  Able to use RPE to guide intensity level when exercising independently       Able to understand and use Dyspnea scale  Yes       Intervention  Provide education and explanation on how to use Dyspnea scale       Expected Outcomes  Short Term: Able to use Dyspnea scale daily in rehab to express subjective sense of shortness of breath during exertion;Long Term: Able to use Dyspnea scale to guide intensity level when exercising independently       Knowledge and understanding of Target Heart Rate Range (THRR)  Yes       Intervention  Provide education and explanation of THRR including how the numbers were predicted and where they are located for reference       Expected Outcomes  Short Term: Able to state/look up THRR;Long Term: Able to use THRR to govern intensity when exercising independently;Short Term: Able to use daily as guideline for intensity in rehab        Able to check pulse independently  Yes       Intervention  Provide education and demonstration on how to check pulse in carotid and radial arteries.;Review the  importance of being able to check your own pulse for safety during independent exercise       Expected Outcomes  Short Term: Able to explain why pulse checking is important during independent exercise;Long Term: Able to check pulse independently and accurately       Understanding of Exercise Prescription  Yes       Intervention  Provide education, explanation, and written materials on patient's individual exercise prescription       Expected Outcomes  Short Term: Able to explain program exercise prescription;Long Term: Able to explain home exercise prescription to exercise independently          Exercise Goals Re-Evaluation :   Discharge Exercise Prescription (Final Exercise Prescription Changes): Exercise Prescription Changes - 11/27/19 1300      Response to Exercise   Blood Pressure (Admit)  146/64    Blood Pressure (Exercise)  126/64    Blood Pressure (Exit)  126/64    Heart Rate (Admit)  75 bpm    Heart Rate (Exercise)  103 bpm    Heart Rate (Exit)  75 bpm    Oxygen Saturation (Admit)  99 %    Oxygen Saturation (Exercise)  99 %    Rating of Perceived Exertion (Exercise)  13    Perceived Dyspnea (Exercise)  3    Symptoms  SOB    Comments  walk test results       Nutrition:  Target Goals: Understanding of nutrition guidelines, daily intake of sodium <153m, cholesterol <2076m calories 30% from fat and 7% or less from saturated fats, daily to have 5 or more servings of fruits and vegetables.  Education: Controlling Sodium/Reading Food Labels -Group verbal and written material supporting the discussion of sodium use in heart healthy nutrition. Review and explanation with models, verbal and written materials for utilization of the food label.   [facility]  Date   03/24/18  Educator  LB  Instruction Review Code  1- Verbalizes Understanding      Education: General Nutrition Guidelines/Fats and Fiber: -Group instruction provided by verbal, written material, models and posters to present the general guidelines for heart healthy nutrition. Gives an explanation and review of dietary fats and fiber.   [facility]  Date  03/15/18  Educator  LB  Instruction Review Code  1- Verbalizes Understanding      Biometrics: Pre Biometrics - 11/27/19 1310      Pre Biometrics   Height  5' 7.2" (1.707 m)    Weight  228 lb 14.4 oz (103.8 kg)    BMI (Calculated)  35.63    Single Leg Stand  9.91 seconds        Nutrition Therapy Plan and Nutrition Goals:   Nutrition Assessments: Nutrition Assessments - 11/27/19 1322      MEDFICTS Scores   Pre Score  61       MEDIFICTS Score Key:          ?70 Need to make dietary changes          40-70 Heart Healthy Diet         ? 40 Therapeutic Level Cholesterol Diet  Nutrition Goals Re-Evaluation:   Nutrition Goals Discharge (Final Nutrition Goals Re-Evaluation):   Psychosocial: Target Goals: Acknowledge presence or absence of significant depression and/or stress, maximize coping skills, provide positive support system. Participant is able to verbalize types and ability to use techniques and skills  needed for reducing stress and depression.   Education: Depression - Provides group verbal and written instruction on the correlation between heart/lung disease and depressed mood, treatment options, and the stigmas associated with seeking treatment.   Cardiac Rehab from 03/24/2018 in [facility] Cardiac and Pulmonary Rehab  Date  11/17/17  Educator  [facility]  Instruction Review Code  1- [employer] Understanding      Education: Sleep Hygiene -Provides group verbal and written instruction about how sleep can affect your health.  Define sleep hygiene, discuss sleep cycles  and impact of sleep habits. Review good sleep hygiene tips.    Cardiac Rehab from 03/24/2018 in [facility] Cardiac and Pulmonary Rehab  Date  02/03/18  Educator  [facility]  Instruction Review Code  1- Verbalizes Understanding       Education: Stress and Anxiety: - Provides group verbal and written instruction about the health risks of elevated stress and causes of high stress.  Discuss the correlation between heart/lung disease and anxiety and treatment options. Review healthy ways to manage with stress and anxiety.   Cardiac Rehab from 10/27/2016 in [facility] Cardiac and Pulmonary Rehab  Date  08/13/16  Educator  TS  Instruction Review Code (retired)  2- meets goals/outcomes       Initial Review & Psychosocial Screening: Initial Psych Review & Screening - 11/22/19 1114      Initial Review   Current issues with  History of Depression;Current Stress Concerns;Current Anxiety/Panic    Source of Stress Concerns  Family    Comments  Stress with his daughter.  Has chronic anxiety is controlled with meds      Family Dynamics   Good Support System?  No    Concerns  No support system    Comments  Stress currently with his daughter. Ex Wife does not drive.    NO current support system      Barriers   Psychosocial barriers to participate in program  There are no identifiable barriers or psychosocial needs.      Screening Interventions   Interventions  Encouraged to exercise    Expected Outcomes  Short Term goal: Utilizing psychosocial counselor, staff and physician to assist with identification of specific Stressors or current issues interfering with healing process. Setting desired goal for each stressor or current issue identified.;Long Term Goal: Stressors or current issues are controlled or eliminated.;Short Term goal: Identification and review with participant of any Quality of Life or Depression concerns found by scoring the questionnaire.;Long Term goal: The participant improves quality of Life and PHQ9  Scores as seen by post scores and/or verbalization of changes       Quality of Life Scores:  Quality of Life - 11/27/19 1311      Quality of Life   Select  Quality of Life      Quality of Life Scores   Health/Function Pre  15.53 %    Socioeconomic Pre  19.5 %    Psych/Spiritual Pre  24 %    Family Pre  24.38 %    GLOBAL Pre  19.23 %      Scores of 19 and below usually indicate a poorer quality of life in these areas.  A difference of  2-3 points is a clinically meaningful difference.  A difference of 2-3 points in the total score of the Quality of Life Index has been associated with significant improvement in overall quality of life, self-image, physical symptoms, and general health in studies assessing change in quality of life.  PHQ-9:  Recent Review Flowsheet Data    Depression screen [facility] 2/9 11/27/2019 03/15/2018 12/27/2017 12/02/2017 10/06/2017   Decreased Interest 2 1 0 2 2   Down, Depressed, Hopeless 1 1 0 2 1   PHQ - 2 Score 3 2 0 4 3   Altered sleeping 1 1 - 1 2   Tired, decreased energy 2 1 - 1 2   Change in appetite 1 1 - 1 2   Feeling bad or failure about yourself  0 1 - 1 2   Trouble concentrating 1 1 - 1 1   Moving slowly or fidgety/restless 0 3 - 1 1   Suicidal thoughts 0 0 - 0 1   PHQ-9 Score 8 10 - 10 14   Difficult doing work/chores Somewhat difficult Somewhat difficult - Somewhat difficult -     Interpretation of Total Score  Total Score Depression Severity:  1-4 = Minimal depression, 5-9 = Mild depression, 10-14 = Moderate depression, 15-19 = Moderately severe depression, 20-27 = Severe depression   Psychosocial Evaluation and Intervention: Psychosocial Evaluation - 11/22/19 1125      Psychosocial Evaluation & Interventions   Comments  Kevin Pearson has a new referral to the program after attending in 2019. He is ready to return and has no identified barriers to entry. He lives alone in an apartment complex with his dog. He has lost 41 pounds since his last time  here and wants to continue his weight loss at 1 - 2 pounds a week until his goalof 200 pounds.He continues to have stressrelated to his relationship with his daughter. HIs son lives in [place]. He wants to get stronger, breath better and continue his weight loss pattern.  He should do well with the program.    Expected Outcomes  Short:  Kevin Pearson will improve his breathing with exercise.   Long:  Kevin Pearson will cope more positively with skills learned and increase his support system for more positive interactions.      Continue Psychosocial Services   Follow up required by staff       Psychosocial Re-Evaluation:   Psychosocial Discharge (Final Psychosocial Re-Evaluation):   Vocational Rehabilitation: Provide vocational rehab assistance to qualifying candidates.   Vocational Rehab Evaluation & Intervention: Vocational Rehab - 11/22/19 1121      Initial Vocational Rehab Evaluation & Intervention   Assessment shows need for Vocational Rehabilitation  No       Education: Education Goals: Education classes will be provided on a variety of topics geared toward better understanding of heart health and risk factor modification. Participant will state understanding/return demonstration of topics presented as noted by education test scores.  Learning Barriers/Preferences:   General Cardiac Education Topics:  AED/CPR: - Group verbal and written instruction with the use of models to demonstrate the basic use of the AED with the basic ABC's of resuscitation.   Cardiac Rehab from 03/24/2018 in [facility] Cardiac and Pulmonary Rehab  Date  03/22/18  Educator  KS  Instruction Review Code  1- Verbalizes Understanding      Anatomy & Physiology of the Heart: - Group verbal and written instruction and models provide basic cardiac anatomy and physiology, with the coronary electrical and arterial systems. Review of Valvular disease and Heart Failure   Cardiac Rehab from 03/24/2018 in [facility] Cardiac and Pulmonary  Rehab  Date  02/24/18  Educator  CE  Instruction Review Code  1- Verbalizes Understanding      Cardiac Procedures: - Group verbal and written instruction to review  commonly prescribed medications for heart disease. Reviews the medication, class of the drug, and side effects. Includes the steps to properly store meds and maintain the prescription regimen. (beta blockers and nitrates)   Cardiac Rehab from 03/24/2018 in [facility] Cardiac and Pulmonary Rehab  Date  03/17/18  Educator  CE  Instruction Review Code  1- Verbalizes Understanding      Cardiac Medications I: - Group verbal and written instruction to review commonly prescribed medications for heart disease. Reviews the medication, class of the drug, and side effects. Includes the steps to properly store meds and maintain the prescription regimen.   Cardiac Rehab from 03/24/2018 in [facility] Cardiac and Pulmonary Rehab  Date  03/01/18  Educator  SB  Instruction Review Code  1- Verbalizes Understanding      Cardiac Medications II: -Group verbal and written instruction to review commonly prescribed medications for heart disease. Reviews the medication, class of the drug, and side effects. (all other drug classes)   Cardiac Rehab from 03/24/2018 in [facility] Cardiac and Pulmonary Rehab  Date  12/16/17  Educator  CE  Instruction Review Code  1- Verbalizes Understanding       Go Sex-Intimacy & Heart Disease, Get SMART - Goal Setting: - Group verbal and written instruction through game format to discuss heart disease and the return to sexual intimacy. Provides group verbal and written material to discuss and apply goal setting through the application of the S.M.A.R.T. Method.   Cardiac Rehab from 03/24/2018 in [facility] Cardiac and Pulmonary Rehab  Date  03/17/18  Educator  CE  Instruction Review Code  1- Verbalizes Understanding      Other Matters of the Heart: - Provides group verbal, written materials and models to describe Stable Angina and  Peripheral Artery. Includes description of the disease process and treatment options available to the cardiac patient.   Cardiac Rehab from 03/24/2018 in [facility] Cardiac and Pulmonary Rehab  Date  02/24/18  Educator  CE  Instruction Review Code  1- Verbalizes Understanding      Infection Prevention: - Provides verbal and written material to individual with discussion of infection control including proper hand washing and proper equipment cleaning during exercise session.   Cardiac Rehab from 11/27/2019 in [facility] Cardiac and Pulmonary Rehab  Date  11/27/19  Educator  [facility]  Instruction Review Code  1- Verbalizes Understanding      Falls Prevention: - Provides verbal and written material to individual with discussion of falls prevention and safety.   Cardiac Rehab from 11/27/2019 in [facility] Cardiac and Pulmonary Rehab  Date  11/27/19  Educator  [facility]  Instruction Review Code  1- Verbalizes Understanding      Other: -Provides group and verbal instruction on various topics (see comments)   Knowledge Questionnaire Score: Knowledge Questionnaire Score - 11/27/19 1322      Knowledge Questionnaire Score   Pre Score  24/26 Education Focus: A&P, nutrition       Core Components/Risk Factors/Patient Goals at Admission: Personal Goals and Risk Factors at Admission - 11/27/19 1323      Core Components/Risk Factors/Patient Goals on Admission    Weight Management  Yes;Weight Loss;Obesity    Intervention  Weight Management: Develop a combined nutrition and exercise program designed to reach desired caloric intake, while maintaining appropriate intake of nutrient and fiber, sodium and fats, and appropriate energy expenditure required for the weight goal.;Weight Management/Obesity: Establish reasonable short term and long term weight goals.;Weight Management: Provide education and appropriate resources to help participant work  on and attain dietary goals.;Obesity: Provide education and appropriate resources  to help participant work on and attain dietary goals.    Admit Weight  228 lb 14.4 oz (103.8 kg)    Goal Weight: Short Term  223 lb (101.2 kg)    Goal Weight: Long Term  200 lb (90.7 kg)    Expected Outcomes  Short Term: Continue to assess and modify interventions until short term weight is achieved;Long Term: Adherence to nutrition and physical activity/exercise program aimed toward attainment of established weight goal;Weight Loss: Understanding of general recommendations for a balanced deficit meal plan, which promotes 1-2 lb weight loss per week and includes a negative energy balance of [phone] kcal/d;Understanding recommendations for meals to include 15-35% energy as protein, 25-35% energy from fat, 35-60% energy from carbohydrates, less than 277m of dietary cholesterol, 20-35 gm of total fiber daily;Understanding of distribution of calorie intake throughout the day with the consumption of 4-5 meals/snacks    Improve shortness of breath with ADL's  Yes    Intervention  Provide education, individualized exercise plan and daily activity instruction to help decrease symptoms of SOB with activities of daily living.    Expected Outcomes  Short Term: Improve cardiorespiratory fitness to achieve a reduction of symptoms when performing ADLs;Long Term: Be able to perform more ADLs without symptoms or delay the onset of symptoms    Heart Failure  Yes    Intervention  Provide a combined exercise and nutrition program that is supplemented with education, support and counseling about heart failure. Directed toward relieving symptoms such as shortness of breath, decreased exercise tolerance, and extremity edema.    Expected Outcomes  Improve functional capacity of life;Short term: Attendance in program 2-3 days a week with increased exercise capacity. Reported lower sodium intake. Reported increased fruit and vegetable intake. Reports medication compliance.;Short term: Daily weights obtained and reported for  increase. Utilizing diuretic protocols set by physician.;Long term: Adoption of self-care skills and reduction of barriers for early signs and symptoms recognition and intervention leading to self-care maintenance.    Hypertension  Yes    Intervention  Provide education on lifestyle modifcations including regular physical activity/exercise, weight management, moderate sodium restriction and increased consumption of fresh fruit, vegetables, and low fat dairy, alcohol moderation, and smoking cessation.;Monitor prescription use compliance.    Expected Outcomes  Short Term: Continued assessment and intervention until BP is < 140/935mHG in hypertensive participants. < 130/8046mG in hypertensive participants with diabetes, heart failure or chronic kidney disease.;Long Term: Maintenance of blood pressure at goal levels.    Lipids  Yes    Intervention  Provide education and support for participant on nutrition & aerobic/resistive exercise along with prescribed medications to achieve LDL <33m89mDL >40mg74m Expected Outcomes  Short Term: Participant states understanding of desired cholesterol values and is compliant with medications prescribed. Participant is following exercise prescription and nutrition guidelines.;Long Term: Cholesterol controlled with medications as prescribed, with individualized exercise RX and with personalized nutrition plan. Value goals: LDL < 33mg,33m > 40 mg.       Education:Diabetes - Individual verbal and written instruction to review signs/symptoms of diabetes, desired ranges of glucose level fasting, after meals and with exercise. Acknowledge that pre and post exercise glucose checks will be done for 3 sessions at entry of program.   Education: Know Your Numbers and Risk Factors: -Group verbal and written instruction about important numbers in your health.  Discussion of what are risk factors and how they  play a role in the disease process.  Review of Cholesterol, Blood  Pressure, Diabetes, and BMI and the role they play in your overall health.   Cardiac Rehab from 03/24/2018 in [facility] Cardiac and Pulmonary Rehab  Date  12/16/17  Educator  CE  Instruction Review Code  1- Verbalizes Understanding      Core Components/Risk Factors/Patient Goals Review:    Core Components/Risk Factors/Patient Goals at Discharge (Final Review):    ITP Comments: ITP Comments    Row Name 11/22/19 1130 11/27/19 1256         ITP Comments  Virtual Orientation call completed with Jsoeph today. He has appt on 5/24 for the EP Eval and gym orientation. Diagnosis documentation can be found in [facility] 11/13/19 office visit  Completed 6MWT and gym orientation.  Initial ITP created and sent for review to Dr. Emily Filbert, Medical Director.         Comments: Initial ITP

## 2019-12-05 ENCOUNTER — Other Ambulatory Visit: Payer: Self-pay

## 2019-12-05 ENCOUNTER — Encounter: Payer: Medicare Other | Attending: Cardiovascular Disease | Admitting: *Deleted

## 2019-12-05 DIAGNOSIS — Z87891 Personal history of nicotine dependence: Secondary | ICD-10-CM | POA: Diagnosis not present

## 2019-12-05 DIAGNOSIS — Z79899 Other long term (current) drug therapy: Secondary | ICD-10-CM | POA: Insufficient documentation

## 2019-12-05 DIAGNOSIS — J449 Chronic obstructive pulmonary disease, unspecified: Secondary | ICD-10-CM | POA: Insufficient documentation

## 2019-12-05 DIAGNOSIS — I480 Paroxysmal atrial fibrillation: Secondary | ICD-10-CM | POA: Insufficient documentation

## 2019-12-05 DIAGNOSIS — Z7901 Long term (current) use of anticoagulants: Secondary | ICD-10-CM | POA: Insufficient documentation

## 2019-12-05 DIAGNOSIS — F419 Anxiety disorder, unspecified: Secondary | ICD-10-CM | POA: Diagnosis not present

## 2019-12-05 DIAGNOSIS — Z7902 Long term (current) use of antithrombotics/antiplatelets: Secondary | ICD-10-CM | POA: Diagnosis not present

## 2019-12-05 DIAGNOSIS — I110 Hypertensive heart disease with heart failure: Secondary | ICD-10-CM | POA: Diagnosis not present

## 2019-12-05 DIAGNOSIS — I509 Heart failure, unspecified: Secondary | ICD-10-CM | POA: Diagnosis not present

## 2019-12-05 DIAGNOSIS — Z87820 Personal history of traumatic brain injury: Secondary | ICD-10-CM | POA: Insufficient documentation

## 2019-12-05 DIAGNOSIS — F329 Major depressive disorder, single episode, unspecified: Secondary | ICD-10-CM | POA: Diagnosis not present

## 2019-12-05 DIAGNOSIS — I208 Other forms of angina pectoris: Secondary | ICD-10-CM | POA: Diagnosis not present

## 2019-12-05 NOTE — Progress Notes (Signed)
Daily Session Note  Patient Details  Name: Kevin Pearson MRN: [id number] Date of Birth: 1951/03/28 Referring Provider:     Cardiac Rehab from 11/27/2019 in [facility] Cardiac and Pulmonary Rehab  Referring Provider  Ida Rogue MD      Encounter Date: 12/05/2019  Check In: Session Check In - 12/05/19 1217      Check-In   Supervising physician immediately available to respond to emergencies  See telemetry face sheet for immediately available ER MD    Location  [facility]-Cardiac & Pulmonary Rehab    Staff Present  Nyoka Cowden, RN, BSN, MA;Melissa Caiola RDN, Rowe Pavy, BA, ACSM CEP, Exercise Physiologist    Virtual Visit  No    Medication changes reported      No    Fall or balance concerns reported     No    Tobacco Cessation  No Change    Warm-up and Cool-down  Performed on first and last piece of equipment    VAD Patient?  No    PAD/SET Patient?  No      Pain Assessment   Currently in Pain?  No/denies          Social History   Tobacco Use  Smoking Status Former Smoker  . Packs/day: 1.00  . Years: 40.00  . Pack years: 40.00  . Types: Cigarettes  . Quit date: 08/2015  . Years since quitting: 4.3  Smokeless Tobacco Never Used  Tobacco Comment   Quit 2017      Goals Met:  Independence with exercise equipment Exercise tolerated well No report of cardiac concerns or symptoms  Goals Unmet:  Not Applicable  Comments: Pt able to follow exercise prescription today without complaint.  Will continue to monitor for progression.   Dr. Emily Filbert is Medical Director for [place] and [name].

## 2019-12-07 ENCOUNTER — Encounter: Payer: Medicare Other | Admitting: *Deleted

## 2019-12-07 ENCOUNTER — Other Ambulatory Visit: Payer: Self-pay

## 2019-12-07 DIAGNOSIS — I208 Other forms of angina pectoris: Secondary | ICD-10-CM | POA: Diagnosis not present

## 2019-12-07 NOTE — Progress Notes (Signed)
Daily Session Note  Patient Details  Name: Kevin Pearson MRN: [id number] Date of Birth: [birth year] Referring Provider:     Cardiac Rehab from 11/27/2019 in [facility] Cardiac and Pulmonary Rehab  Referring Provider  Ida Rogue MD      Encounter Date: 12/07/2019  Check In: Session Check In - 12/07/19 1145      Check-In   Supervising physician immediately available to respond to emergencies  See telemetry face sheet for immediately available ER MD    Location  [facility]-Cardiac & Pulmonary Rehab    Staff Present  Renita Papa, RN Margurite Auerbach, MS Exercise Physiologist;Melissa Caiola RDN, LDN    Virtual Visit  No    Medication changes reported      No    Fall or balance concerns reported     No    Warm-up and Cool-down  Performed on first and last piece of equipment    Resistance Training Performed  Yes    VAD Patient?  No    PAD/SET Patient?  No      Pain Assessment   Currently in Pain?  No/denies          Social History   Tobacco Use  Smoking Status Former Smoker  . Packs/day: 1.00  . Years: 40.00  . Pack years: 40.00  . Types: Cigarettes  . Quit date: 08/2015  . Years since quitting: 4.3  Smokeless Tobacco Never Used  Tobacco Comment   Quit 2017      Goals Met:  Independence with exercise equipment Exercise tolerated well No report of cardiac concerns or symptoms Strength training completed today  Goals Unmet:  Not Applicable  Comments: Pt able to follow exercise prescription today without complaint.  Will continue to monitor for progression.    Dr. Emily Filbert is Medical Director for [place] and [name].

## 2019-12-11 NOTE — Progress Notes (Deleted)
No show

## 2019-12-12 ENCOUNTER — Other Ambulatory Visit: Payer: Self-pay

## 2019-12-12 ENCOUNTER — Encounter: Payer: Medicare Other | Admitting: *Deleted

## 2019-12-12 DIAGNOSIS — I208 Other forms of angina pectoris: Secondary | ICD-10-CM

## 2019-12-12 NOTE — Progress Notes (Signed)
Daily Session Note  Patient Details  Name: Kevin Pearson MRN: [id number] Date of Birth: 03-06-1951 Referring Provider:     Cardiac Rehab from 11/27/2019 in [facility] Cardiac and Pulmonary Rehab  Referring Provider  Ida Rogue MD      Encounter Date: 12/12/2019  Check In: Session Check In - 12/12/19 1126      Check-In   Supervising physician immediately available to respond to emergencies  See telemetry face sheet for immediately available ER MD    Location  [facility]-Cardiac & Pulmonary Rehab    Staff Present  Heath Lark, RN, BSN, CCRP;Melissa Caiola RDN, LDN;Joseph Hood RCP,RRT,BSRT    Virtual Visit  No    Medication changes reported      No    Fall or balance concerns reported     No    Warm-up and Cool-down  Performed on first and last piece of equipment    Resistance Training Performed  Yes    VAD Patient?  No    PAD/SET Patient?  No      Pain Assessment   Currently in Pain?  No/denies          Social History   Tobacco Use  Smoking Status Former Smoker  . Packs/day: 1.00  . Years: 40.00  . Pack years: 40.00  . Types: Cigarettes  . Quit date: 08/2015  . Years since quitting: 4.3  Smokeless Tobacco Never Used  Tobacco Comment   Quit 2017      Goals Met:  Independence with exercise equipment Exercise tolerated well No report of cardiac concerns or symptoms  Goals Unmet:  Not Applicable  Comments: Pt able to follow exercise prescription today without complaint.  Will continue to monitor for progression.    Dr. Emily Filbert is Medical Director for [place] and [name].

## 2019-12-13 ENCOUNTER — Ambulatory Visit: Payer: Medicare Other | Admitting: Cardiovascular Disease

## 2019-12-14 ENCOUNTER — Encounter: Payer: Self-pay | Admitting: Cardiovascular Disease

## 2019-12-14 ENCOUNTER — Other Ambulatory Visit: Payer: Self-pay | Admitting: Cardiovascular Disease

## 2019-12-20 ENCOUNTER — Encounter: Payer: Self-pay | Admitting: *Deleted

## 2019-12-20 DIAGNOSIS — I208 Other forms of angina pectoris: Secondary | ICD-10-CM

## 2019-12-20 NOTE — Progress Notes (Signed)
Cardiac Individual Treatment Plan  Patient Details  Name: Kevin Pearson MRN: [id number] Date of Birth: 1951/03/25 Referring Provider:     Cardiac Rehab from 11/27/2019 in [facility] Cardiac and Pulmonary Rehab  Referring Provider Ida Rogue MD      Initial Encounter Date:    Cardiac Rehab from 11/27/2019 in [facility] Cardiac and Pulmonary Rehab  Date 11/27/19      Visit Diagnosis: Chronic stable angina ([place])  Patient's Home Medications on Admission:  Current Outpatient Medications:  .  acetaminophen (TYLENOL) 325 MG tablet, Take 650 mg by mouth every 6 (six) hours as needed for mild pain or headache. , Disp: , Rfl:  .  albuterol (VENTOLIN HFA) 108 (90 Base) MCG/ACT inhaler, Inhale 2 puffs into the lungs every 6 (six) hours as needed for wheezing or shortness of breath., Disp: 18 g, Rfl: 1 .  busPIRone (BUSPAR) 10 MG tablet, Take 10 mg by mouth 3 (three) times daily., Disp: , Rfl:  .  Carboxymethylcell-Glycerin PF (REFRESH OPTIVE PF) 0.5-0.9 % SOLN, Place 1 drop into Pearson eyes daily as needed (Dry eye)., Disp: , Rfl:  .  clopidogrel (PLAVIX) 75 MG tablet, Take 75 mg by mouth daily., Disp: , Rfl:  .  divalproex (DEPAKOTE) 250 MG DR tablet, Take 750 mg by mouth 2 (two) times daily. , Disp: , Rfl:  .  gabapentin (NEURONTIN) 300 MG capsule, Take 600 mg by mouth 2 (two) times daily. , Disp: , Rfl:  .  Magnesium Oxide 400 MG CAPS, Take 1 capsule (400 mg total) by mouth daily., Disp: 90 capsule, Rfl: 3 .  Melatonin 5 MG TABS, Take 5 mg by mouth at bedtime. , Disp: , Rfl:  .  metoprolol succinate (TOPROL-XL) 50 MG 24 hr tablet, TAKE 1 TABLET BY MOUTH EVERY DAY WITH OR IMMEDIATELY AFTER MEAL (Patient taking differently: Take 50 mg by mouth daily. OR IMMEDIATELY AFTER MEAL), Disp: 90 tablet, Rfl: 3 .  Multiple Vitamin (MULTIVITAMIN) tablet, Take 1 tablet by mouth daily., Disp: , Rfl:  .  nitroGLYCERIN (NITROSTAT) 0.4 MG SL tablet, Place 0.4 mg under the tongue every 5 (five) minutes as needed for chest  pain., Disp: , Rfl:  .  omeprazole (PRILOSEC) 40 MG capsule, Take 1 capsule (40 mg total) by mouth 2 (two) times daily., Disp: 90 capsule, Rfl: 3 .  rivaroxaban (XARELTO) 20 MG TABS tablet, Take 20 mg by mouth daily with supper. , Disp: , Rfl:  .  rosuvastatin (CRESTOR) 20 MG tablet, Take 20 mg by mouth at bedtime. , Disp: , Rfl:  .  senna (SENOKOT) 8.6 MG tablet, Take 2 tablets by mouth 2 (two) times daily. , Disp: , Rfl:  .  tamsulosin (FLOMAX) 0.4 MG CAPS capsule, TAKE ONE CAPSULE BY MOUTH EVERY DAY AFTER SUPPER (Patient taking differently: Take 0.4 mg by mouth daily. ), Disp: 30 capsule, Rfl: 11 .  torsemide (DEMADEX) 20 MG tablet, TAKE 2 TABLETS BY MOUTH TWICE A DAY (Patient taking differently: Take 40 mg by mouth daily. ), Disp: 360 tablet, Rfl: 0 .  traMADol (ULTRAM) 50 MG tablet, Take 50 mg by mouth every 6 (six) hours as needed for moderate pain. , Disp: , Rfl:  .  traZODone (DESYREL) 100 MG tablet, Take 100 mg by mouth at bedtime as needed for sleep. , Disp: , Rfl:   Past Medical History: Past Medical History:  Diagnosis Date  . Acid reflux   . Anxiety   . Arthritis   . Atrial fibrillation ([place])   .  CHF (congestive heart failure) ([place])   . Chronic orthostatic hypotension   . Clotting disorder ([place])   . COPD (chronic obstructive pulmonary disease) ([place])   . Depression   . Elevated PSA   . Heart attack ([place])   . Heart disease   . Heart failure ([place])   . Hepatitis   . High cholesterol   . Hypertension   . Sleep apnea   . TBI (traumatic brain injury) ([place])   . Urinary retention     Tobacco Use: Social History   Tobacco Use  Smoking Status Former Smoker  . Packs/day: 1.00  . Years: 40.00  . Pack years: 40.00  . Types: Cigarettes  . Quit date: 08/2015  . Years since quitting: 4.3  Smokeless Tobacco Never Used  Tobacco Comment   Quit 2017      Labs: Recent Review Flowsheet Data    Labs for ITP Cardiac and Pulmonary Rehab Latest Ref Rng & Units 11/23/2017    Cholestrol 100 - 199 mg/dL 114   LDLCALC 0 - 99 mg/dL 60   HDL >39 mg/dL 30(L)   Trlycerides 0 - 149 mg/dL 119       Exercise Target Goals: Exercise Program Goal: Individual exercise prescription set using results from initial 6 min walk test and THRR while considering  patient's activity barriers and safety.   Exercise Prescription Goal: Initial exercise prescription builds to 30-45 minutes a day of aerobic activity, 2-3 days per week.  Home exercise guidelines will be given to patient during program as part of exercise prescription that the participant will acknowledge.   Education: Aerobic Exercise & Resistance Training: - Gives group verbal and written instruction on the various components of exercise. Focuses on aerobic and resistive training programs and the benefits of this training and how to safely progress through these programs..   Cardiac Rehab from 03/24/2018 in [facility] Cardiac and Pulmonary Rehab  Date 02/10/18  Educator AS  Instruction Review Code 1- Verbalizes Understanding      Education: Exercise & Equipment Safety: - Individual verbal instruction and demonstration of equipment use and safety with use of the equipment.   Cardiac Rehab from 11/27/2019 in [facility] Cardiac and Pulmonary Rehab  Date 11/27/19  Educator [facility]  Instruction Review Code 1- Verbalizes Understanding      Education: Exercise Physiology & General Exercise Guidelines: - Group verbal and written instruction with models to review the exercise physiology of the cardiovascular system and associated critical values. Provides general exercise guidelines with specific guidelines to those with heart or lung disease.    Cardiac Rehab from 03/24/2018 in [facility] Cardiac and Pulmonary Rehab  Date 02/01/18  Educator [facility]  Instruction Review Code 1- Verbalizes Understanding      Education: Flexibility, Balance, Mind/Body Relaxation: Provides group verbal/written instruction on the benefits of flexibility and balance  training, including mind/body exercise modes such as yoga, pilates and tai chi.  Demonstration and skill practice provided.   Cardiac Rehab from 03/24/2018 in [facility] Cardiac and Pulmonary Rehab  Date 12/23/17  Educator AS  Instruction Review Code 1- Verbalizes Understanding      Activity Barriers & Risk Stratification:  Activity Barriers & Cardiac Risk Stratification - 11/27/19 1258      Activity Barriers & Cardiac Risk Stratification   Activity Barriers Shortness of Breath;Back Problems;Joint Problems;Decreased Ventricular Function;Deconditioning;History of Falls;Balance Concerns;Neck/Spine Problems;Muscular Weakness    Cardiac Risk Stratification High           6 Minute Walk:  6 Minute Walk  Row Name 11/27/19 1257         6 Minute Walk   Phase Initial     Distance 1045 feet     Walk Time 6 minutes     MPH 1.98     RPE 13     Perceived Dyspnea  3     VO2 Peak 7.48     Symptoms Yes (comment)     Comments SOB, neuropathy     Resting HR 75 bpm     Resting BP 146/64     Resting Oxygen Saturation  99 %     Exercise Oxygen Saturation  during 6 min walk 99 %     Max Ex. HR 103 bpm     Max Ex. BP 126/64     2 Minute Post BP 126/64            Oxygen Initial Assessment:  Oxygen Initial Assessment - 11/22/19 1113      Home Oxygen   Home Oxygen Device Home Concentrator    Sleep Oxygen Prescription Continuous    Liters per minute 2    Home Exercise Oxygen Prescription None    Home at Rest Exercise Oxygen Prescription None    Compliance with Home Oxygen Use Yes      Intervention   Short Term Goals To learn and understand importance of monitoring SPO2 with pulse oximeter and demonstrate accurate use of the pulse oximeter.;To learn and understand importance of maintaining oxygen saturations>88%;To learn and demonstrate proper pursed lip breathing techniques or other breathing techniques.    Long  Term Goals Exhibits compliance with exercise, home and travel O2  prescription;Verbalizes importance of monitoring SPO2 with pulse oximeter and return demonstration;Maintenance of O2 saturations>88%;Exhibits proper breathing techniques, such as pursed lip breathing or other method taught during program session           Oxygen Re-Evaluation:   Oxygen Discharge (Final Oxygen Re-Evaluation):   Initial Exercise Prescription:  Initial Exercise Prescription - 11/27/19 1300      Date of Initial Exercise RX and Referring Provider   Date 11/27/19    Referring Provider Ida Rogue MD      Treadmill   MPH 1.5    Grade 0.5    Minutes 15    METs 2.25      Recumbant Bike   Level 1    RPM 50    Watts 5    Minutes 15    METs 2      NuStep   Level 1    SPM 80    Minutes 15    METs 2      REL-XR   Level 1    Speed 50    Minutes 15    METs 2      Prescription Details   Frequency (times per week) 2    Duration Progress to 30 minutes of continuous aerobic without signs/symptoms of physical distress      Intensity   THRR 40-80% of Max Heartrate 106-137    Ratings of Perceived Exertion 11-13    Perceived Dyspnea 0-4      Progression   Progression Continue to progress workloads to maintain intensity without signs/symptoms of physical distress.      Resistance Training   Training Prescription Yes    Weight 3 lb    Reps 10-15           Perform Capillary Blood Glucose checks as needed.  Exercise Prescription Changes:  Exercise Prescription Changes  [place] Name 11/27/19 1300 12/14/19 1100           Response to Exercise   Blood Pressure (Admit) 146/64 98/70      Blood Pressure (Exercise) 126/64 112/60      Blood Pressure (Exit) 126/64 86/62      Heart Rate (Admit) 75 bpm 65 bpm      Heart Rate (Exercise) 103 bpm 102 bpm      Heart Rate (Exit) 75 bpm 75 bpm      Oxygen Saturation (Admit) 99 % --      Oxygen Saturation (Exercise) 99 % --      Rating of Perceived Exertion (Exercise) 13 12      Perceived Dyspnea (Exercise) 3  --      Symptoms SOB low BP       Comments walk test results third full day of exercise      Duration -- Continue with 30 min of aerobic exercise without signs/symptoms of physical distress.      Intensity -- THRR unchanged        Progression   Progression -- Continue to progress workloads to maintain intensity without signs/symptoms of physical distress.      Average METs -- 2.18        Resistance Training   Training Prescription -- Yes      Weight -- 3 lb      Reps -- 10-15        Interval Training   Interval Training -- No        Treadmill   MPH -- 1.5      Grade -- 0.5      Minutes -- 15      METs -- 2.25        REL-XR   Level -- 1      Minutes -- 15      METs -- 2.1             Exercise Comments:   Exercise Goals and Review:  Exercise Goals    Row Name 11/27/19 1310             Exercise Goals   Increase Physical Activity Yes       Intervention Provide advice, education, support and counseling about physical activity/exercise needs.;Develop an individualized exercise prescription for aerobic and resistive training based on initial evaluation findings, risk stratification, comorbidities and participant's personal goals.       Expected Outcomes Short Term: Attend rehab on a regular basis to increase amount of physical activity.;Long Term: Add in home exercise to make exercise part of routine and to increase amount of physical activity.;Long Term: Exercising regularly at least 3-5 days a week.       Able to understand and use rate of perceived exertion (RPE) scale Yes       Intervention Provide education and explanation on how to use RPE scale       Expected Outcomes Short Term: Able to use RPE daily in rehab to express subjective intensity level;Long Term:  Able to use RPE to guide intensity level when exercising independently       Able to understand and use Dyspnea scale Yes       Intervention Provide education and explanation on how to use Dyspnea scale        Expected Outcomes Short Term: Able to use Dyspnea scale daily in rehab to express subjective sense of shortness of breath during exertion;Long Term: Able to use Dyspnea scale to guide  intensity level when exercising independently       Knowledge and understanding of Target Heart Rate Range (THRR) Yes       Intervention Provide education and explanation of THRR including how the numbers were predicted and where they are located for reference       Expected Outcomes Short Term: Able to state/look up THRR;Long Term: Able to use THRR to govern intensity when exercising independently;Short Term: Able to use daily as guideline for intensity in rehab       Able to check pulse independently Yes       Intervention Provide education and demonstration on how to check pulse in carotid and radial arteries.;Review the importance of being able to check your own pulse for safety during independent exercise       Expected Outcomes Short Term: Able to explain why pulse checking is important during independent exercise;Long Term: Able to check pulse independently and accurately       Understanding of Exercise Prescription Yes       Intervention Provide education, explanation, and written materials on patient's individual exercise prescription       Expected Outcomes Short Term: Able to explain program exercise prescription;Long Term: Able to explain home exercise prescription to exercise independently              Exercise Goals Re-Evaluation :  Exercise Goals Re-Evaluation    [place] Name 12/14/19 1142             Exercise Goal Re-Evaluation   Exercise Goals Review Increase Physical Activity;Increase Strength and Stamina;Understanding of Exercise Prescription       Comments Ephriam is off to a good start in rehab.  He has completed his first three full days of exercise.  We will continue monitor his progress.       Expected Outcomes Short: Continue to attend rehab regularly  Long: Continue to improve stamina.               Discharge Exercise Prescription (Final Exercise Prescription Changes):  Exercise Prescription Changes - 12/14/19 1100      Response to Exercise   Blood Pressure (Admit) 98/70    Blood Pressure (Exercise) 112/60    Blood Pressure (Exit) 86/62    Heart Rate (Admit) 65 bpm    Heart Rate (Exercise) 102 bpm    Heart Rate (Exit) 75 bpm    Rating of Perceived Exertion (Exercise) 12    Symptoms low BP     Comments third full day of exercise    Duration Continue with 30 min of aerobic exercise without signs/symptoms of physical distress.    Intensity THRR unchanged      Progression   Progression Continue to progress workloads to maintain intensity without signs/symptoms of physical distress.    Average METs 2.18      Resistance Training   Training Prescription Yes    Weight 3 lb    Reps 10-15      Interval Training   Interval Training No      Treadmill   MPH 1.5    Grade 0.5    Minutes 15    METs 2.25      REL-XR   Level 1    Minutes 15    METs 2.1           Nutrition:  Target Goals: Understanding of nutrition guidelines, daily intake of sodium <1553m, cholesterol <2024m calories 30% from fat and 7% or less from saturated fats, daily to have  5 or more servings of fruits and vegetables.  Education: Controlling Sodium/Reading Food Labels -Group verbal and written material supporting the discussion of sodium use in heart healthy nutrition. Review and explanation with models, verbal and written materials for utilization of the food label.   Cardiac Rehab from 03/24/2018 in [facility] Cardiac and Pulmonary Rehab  Date 03/24/18  Educator LB  Instruction Review Code 1- Verbalizes Understanding      Education: General Nutrition Guidelines/Fats and Fiber: -Group instruction provided by verbal, written material, models and posters to present the general guidelines for heart healthy nutrition. Gives an explanation and review of dietary fats and fiber.   Cardiac Rehab  from 03/24/2018 in [facility] Cardiac and Pulmonary Rehab  Date 03/15/18  Educator LB  Instruction Review Code 1- Verbalizes Understanding      Biometrics:  Pre Biometrics - 11/27/19 1310      Pre Biometrics   Height 5' 7.2" (1.707 m)    Weight 228 lb 14.4 oz (103.8 kg)    BMI (Calculated) 35.63    Single Leg Stand 9.91 seconds            Nutrition Therapy Plan and Nutrition Goals:   Nutrition Assessments:  Nutrition Assessments - 11/27/19 1322      MEDFICTS Scores   Pre Score 61           MEDIFICTS Score Key:          ?70 Need to make dietary changes          40-70 Heart Healthy Diet         ? 40 Therapeutic Level Cholesterol Diet  Nutrition Goals Re-Evaluation:   Nutrition Goals Discharge (Final Nutrition Goals Re-Evaluation):   Psychosocial: Target Goals: Acknowledge presence or absence of significant depression and/or stress, maximize coping skills, provide positive support system. Participant is able to verbalize types and ability to use techniques and skills needed for reducing stress and depression.   Education: Depression - Provides group verbal and written instruction on the correlation between heart/lung disease and depressed mood, treatment options, and the stigmas associated with seeking treatment.   Cardiac Rehab from 03/24/2018 in [facility] Cardiac and Pulmonary Rehab  Date 11/17/17  Educator [facility]  Instruction Review Code 1- [employer] Understanding      Education: Sleep Hygiene -Provides group verbal and written instruction about how sleep can affect your health.  Define sleep hygiene, discuss sleep cycles and impact of sleep habits. Review good sleep hygiene tips.    Cardiac Rehab from 03/24/2018 in [facility] Cardiac and Pulmonary Rehab  Date 02/03/18  Educator [facility]  Instruction Review Code 1- Verbalizes Understanding       Education: Stress and Anxiety: - Provides group verbal and written instruction about the health risks of elevated stress and causes of  high stress.  Discuss the correlation between heart/lung disease and anxiety and treatment options. Review healthy ways to manage with stress and anxiety.   Cardiac Rehab from 10/27/2016 in [facility] Cardiac and Pulmonary Rehab  Date 08/13/16  Educator TS  Instruction Review Code (retired) 2- meets goals/outcomes       Initial Review & Psychosocial Screening:  Initial Psych Review & Screening - 11/22/19 1114      Initial Review   Current issues with History of Depression;Current Stress Concerns;Current Anxiety/Panic    Source of Stress Concerns Family    Comments Stress with his daughter.  Has chronic anxiety is controlled with meds      Family Dynamics   Good Support  System? No    Concerns No support system    Comments Stress currently with his daughter. Ex Wife does not drive.    NO current support system      Barriers   Psychosocial barriers to participate in program There are no identifiable barriers or psychosocial needs.      Screening Interventions   Interventions Encouraged to exercise    Expected Outcomes Short Term goal: Utilizing psychosocial counselor, staff and physician to assist with identification of specific Stressors or current issues interfering with healing process. Setting desired goal for each stressor or current issue identified.;Long Term Goal: Stressors or current issues are controlled or eliminated.;Short Term goal: Identification and review with participant of any Quality of Life or Depression concerns found by scoring the questionnaire.;Long Term goal: The participant improves quality of Life and PHQ9 Scores as seen by post scores and/or verbalization of changes           Quality of Life Scores:   Quality of Life - 11/27/19 1311      Quality of Life   Select Quality of Life      Quality of Life Scores   Health/Function Pre 15.53 %    Socioeconomic Pre 19.5 %    Psych/Spiritual Pre 24 %    Family Pre 24.38 %    GLOBAL Pre 19.23 %          Scores  of 19 and below usually indicate a poorer quality of life in these areas.  A difference of  2-3 points is a clinically meaningful difference.  A difference of 2-3 points in the total score of the Quality of Life Index has been associated with significant improvement in overall quality of life, self-image, physical symptoms, and general health in studies assessing change in quality of life.  PHQ-9: Recent Review Flowsheet Data    Depression screen [facility] 2/9 11/27/2019 03/15/2018 12/27/2017 12/02/2017 10/06/2017   Decreased Interest 2 1 0 2 2   Down, Depressed, Hopeless 1 1 0 2 1   PHQ - 2 Score 3 2 0 4 3   Altered sleeping 1 1 - 1 2   Tired, decreased energy 2 1 - 1 2   Change in appetite 1 1 - 1 2   Feeling bad or failure about yourself  0 1 - 1 2   Trouble concentrating 1 1 - 1 1   Moving slowly or fidgety/restless 0 3 - 1 1   Suicidal thoughts 0 0 - 0 1   PHQ-9 Score 8 10 - 10 14   Difficult doing work/chores Somewhat difficult Somewhat difficult - Somewhat difficult -     Interpretation of Total Score  Total Score Depression Severity:  1-4 = Minimal depression, 5-9 = Mild depression, 10-14 = Moderate depression, 15-19 = Moderately severe depression, 20-27 = Severe depression   Psychosocial Evaluation and Intervention:  Psychosocial Evaluation - 11/22/19 1125      Psychosocial Evaluation & Interventions   Comments Rashun has a new referral to the program after attending in 2019. He is ready to return and has no identified barriers to entry. He lives alone in an apartment complex with his dog. He has lost 41 pounds since his last time here and wants to continue his weight loss at 1 - 2 pounds a week until his goalof 200 pounds.He continues to have stressrelated to his relationship with his daughter. HIs son lives in [place]. He wants to get stronger, breath better and continue his weight  loss pattern.  He should do well with the program.    Expected Outcomes Short:  Axel will improve his  breathing with exercise.   Long:  Daisean will cope more positively with skills learned and increase his support system for more positive interactions.      Continue Psychosocial Services  Follow up required by staff           Psychosocial Re-Evaluation:   Psychosocial Discharge (Final Psychosocial Re-Evaluation):   Vocational Rehabilitation: Provide vocational rehab assistance to qualifying candidates.   Vocational Rehab Evaluation & Intervention:  Vocational Rehab - 11/22/19 1121      Initial Vocational Rehab Evaluation & Intervention   Assessment shows need for Vocational Rehabilitation No           Education: Education Goals: Education classes will be provided on a variety of topics geared toward better understanding of heart health and risk factor modification. Participant will state understanding/return demonstration of topics presented as noted by education test scores.  Learning Barriers/Preferences:   General Cardiac Education Topics:  AED/CPR: - Group verbal and written instruction with the use of models to demonstrate the basic use of the AED with the basic ABC's of resuscitation.   Cardiac Rehab from 03/24/2018 in [facility] Cardiac and Pulmonary Rehab  Date 03/22/18  Educator [place]  Instruction Review Code 1- Verbalizes Understanding      Anatomy & Physiology of the Heart: - Group verbal and written instruction and models provide basic cardiac anatomy and physiology, with the coronary electrical and arterial systems. Review of Valvular disease and Heart Failure   Cardiac Rehab from 03/24/2018 in [facility] Cardiac and Pulmonary Rehab  Date 02/24/18  Educator CE  Instruction Review Code 1- Verbalizes Understanding      Cardiac Procedures: - Group verbal and written instruction to review commonly prescribed medications for heart disease. Reviews the medication, class of the drug, and side effects. Includes the steps to properly store meds and maintain the prescription  regimen. (beta blockers and nitrates)   Cardiac Rehab from 03/24/2018 in [facility] Cardiac and Pulmonary Rehab  Date 03/17/18  Educator CE  Instruction Review Code 1- Verbalizes Understanding      Cardiac Medications I: - Group verbal and written instruction to review commonly prescribed medications for heart disease. Reviews the medication, class of the drug, and side effects. Includes the steps to properly store meds and maintain the prescription regimen.   Cardiac Rehab from 03/24/2018 in [facility] Cardiac and Pulmonary Rehab  Date 03/01/18  Educator SB  Instruction Review Code 1- Verbalizes Understanding      Cardiac Medications II: -Group verbal and written instruction to review commonly prescribed medications for heart disease. Reviews the medication, class of the drug, and side effects. (all other drug classes)   Cardiac Rehab from 03/24/2018 in [facility] Cardiac and Pulmonary Rehab  Date 12/16/17  Educator CE  Instruction Review Code 1- Verbalizes Understanding       Go Sex-Intimacy & Heart Disease, Get SMART - Goal Setting: - Group verbal and written instruction through game format to discuss heart disease and the return to sexual intimacy. Provides group verbal and written material to discuss and apply goal setting through the application of the S.M.A.R.T. Method.   Cardiac Rehab from 03/24/2018 in [facility] Cardiac and Pulmonary Rehab  Date 03/17/18  Educator CE  Instruction Review Code 1- Verbalizes Understanding      Other Matters of the Heart: - Provides group verbal, written materials and models to describe Stable Angina and  Peripheral Artery. Includes description of the disease process and treatment options available to the cardiac patient.   Cardiac Rehab from 03/24/2018 in [facility] Cardiac and Pulmonary Rehab  Date 02/24/18  Educator CE  Instruction Review Code 1- Verbalizes Understanding      Infection Prevention: - Provides verbal and written material to individual with  discussion of infection control including proper hand washing and proper equipment cleaning during exercise session.   Cardiac Rehab from 11/27/2019 in [facility] Cardiac and Pulmonary Rehab  Date 11/27/19  Educator [facility]  Instruction Review Code 1- Verbalizes Understanding      Falls Prevention: - Provides verbal and written material to individual with discussion of falls prevention and safety.   Cardiac Rehab from 11/27/2019 in [facility] Cardiac and Pulmonary Rehab  Date 11/27/19  Educator [facility]  Instruction Review Code 1- Verbalizes Understanding      Other: -Provides group and verbal instruction on various topics (see comments)   Knowledge Questionnaire Score:  Knowledge Questionnaire Score - 11/27/19 1322      Knowledge Questionnaire Score   Pre Score 24/26 Education Focus: A&P, nutrition           Core Components/Risk Factors/Patient Goals at Admission:  Personal Goals and Risk Factors at Admission - 11/27/19 1323      Core Components/Risk Factors/Patient Goals on Admission    Weight Management Yes;Weight Loss;Obesity    Intervention Weight Management: Develop a combined nutrition and exercise program designed to reach desired caloric intake, while maintaining appropriate intake of nutrient and fiber, sodium and fats, and appropriate energy expenditure required for the weight goal.;Weight Management/Obesity: Establish reasonable short term and long term weight goals.;Weight Management: Provide education and appropriate resources to help participant work on and attain dietary goals.;Obesity: Provide education and appropriate resources to help participant work on and attain dietary goals.    Admit Weight 228 lb 14.4 oz (103.8 kg)    Goal Weight: Short Term 223 lb (101.2 kg)    Goal Weight: Long Term 200 lb (90.7 kg)    Expected Outcomes Short Term: Continue to assess and modify interventions until short term weight is achieved;Long Term: Adherence to nutrition and physical activity/exercise  program aimed toward attainment of established weight goal;Weight Loss: Understanding of general recommendations for a balanced deficit meal plan, which promotes 1-2 lb weight loss per week and includes a negative energy balance of [phone] kcal/d;Understanding recommendations for meals to include 15-35% energy as protein, 25-35% energy from fat, 35-60% energy from carbohydrates, less than 270m of dietary cholesterol, 20-35 gm of total fiber daily;Understanding of distribution of calorie intake throughout the day with the consumption of 4-5 meals/snacks    Improve shortness of breath with ADL's Yes    Intervention Provide education, individualized exercise plan and daily activity instruction to help decrease symptoms of SOB with activities of daily living.    Expected Outcomes Short Term: Improve cardiorespiratory fitness to achieve a reduction of symptoms when performing ADLs;Long Term: Be able to perform more ADLs without symptoms or delay the onset of symptoms    Heart Failure Yes    Intervention Provide a combined exercise and nutrition program that is supplemented with education, support and counseling about heart failure. Directed toward relieving symptoms such as shortness of breath, decreased exercise tolerance, and extremity edema.    Expected Outcomes Improve functional capacity of life;Short term: Attendance in program 2-3 days a week with increased exercise capacity. Reported lower sodium intake. Reported increased fruit and vegetable intake. Reports medication compliance.;Short term: Daily  weights obtained and reported for increase. Utilizing diuretic protocols set by physician.;Long term: Adoption of self-care skills and reduction of barriers for early signs and symptoms recognition and intervention leading to self-care maintenance.    Hypertension Yes    Intervention Provide education on lifestyle modifcations including regular physical activity/exercise, weight management, moderate sodium  restriction and increased consumption of fresh fruit, vegetables, and low fat dairy, alcohol moderation, and smoking cessation.;Monitor prescription use compliance.    Expected Outcomes Short Term: Continued assessment and intervention until BP is < 140/1m HG in hypertensive participants. < 130/850mHG in hypertensive participants with diabetes, heart failure or chronic kidney disease.;Long Term: Maintenance of blood pressure at goal levels.    Lipids Yes    Intervention Provide education and support for participant on nutrition & aerobic/resistive exercise along with prescribed medications to achieve LDL <7017mHDL >62m20m  Expected Outcomes Short Term: Participant states understanding of desired cholesterol values and is compliant with medications prescribed. Participant is following exercise prescription and nutrition guidelines.;Long Term: Cholesterol controlled with medications as prescribed, with individualized exercise RX and with personalized nutrition plan. Value goals: LDL < 70mg43mL > 40 mg.           Education:Diabetes - Individual verbal and written instruction to review signs/symptoms of diabetes, desired ranges of glucose level fasting, after meals and with exercise. Acknowledge that pre and post exercise glucose checks will be done for 3 sessions at entry of program.   Education: Know Your Numbers and Risk Factors: -Group verbal and written instruction about important numbers in your health.  Discussion of what are risk factors and how they play a role in the disease process.  Review of Cholesterol, Blood Pressure, Diabetes, and BMI and the role they play in your overall health.   [facility]  Date 12/16/17  Educator CE  Instruction Review Code 1- Verbalizes Understanding      Core Components/Risk Factors/Patient Goals Review:    Core Components/Risk Factors/Patient Goals at Discharge (Final Review):    ITP  Comments:  ITP Comments    Row Name 11/22/19 1130 11/27/19 1256 12/20/19 0610       ITP Comments Virtual Orientation call completed with Duane today. He has appt on 5/24 for the EP Eval and gym orientation. Diagnosis [facility] office visit Completed 6MWT and gym orientation.  Initial ITP created and sent for review to Dr. Mark Emily Filbertical Director. 30 Day review completed. Medical Director ITP review done, changes made as directed, and signed approval by Medical Director.            Comments: 30 Day review completed. Medical Director ITP review done, changes made as directed, and signed approval by Medical Director.

## 2019-12-25 ENCOUNTER — Encounter: Payer: Self-pay | Admitting: *Deleted

## 2019-12-25 ENCOUNTER — Telehealth: Payer: Self-pay | Admitting: *Deleted

## 2019-12-25 DIAGNOSIS — I208 Other forms of angina pectoris: Secondary | ICD-10-CM

## 2019-12-25 NOTE — Telephone Encounter (Signed)
Called to check on patient.  He was out with various appointments.  He plans to be here tomorrow.

## 2019-12-26 ENCOUNTER — Encounter: Payer: Medicare Other | Admitting: *Deleted

## 2019-12-26 ENCOUNTER — Other Ambulatory Visit: Payer: Self-pay

## 2019-12-26 DIAGNOSIS — I208 Other forms of angina pectoris: Secondary | ICD-10-CM | POA: Diagnosis not present

## 2019-12-26 NOTE — Progress Notes (Signed)
Daily Session Note  Patient Details  Name: [name] MRN: [id number] Date of Birth: 11-Jan-1951 Referring Provider:     Cardiac Rehab from 11/27/2019 in [facility] Cardiac and Pulmonary Rehab  Referring Provider Ida Rogue MD      Encounter Date: 12/26/2019  Check In:  Session Check In - 12/26/19 1212      Check-In   Supervising physician immediately available to respond to emergencies See telemetry face sheet for immediately available ER MD    Location [facility]-Cardiac & Pulmonary Rehab    Staff Present Heath Lark, RN, BSN, CCRP;Joseph Hood RCP,RRT,BSRT;Amanda Oletta Darter, [place], ACSM CEP, Exercise Physiologist    Virtual Visit No    Medication changes reported     No    Fall or balance concerns reported    No    Warm-up and Cool-down Performed on first and last piece of equipment    Resistance Training Performed Yes    VAD Patient? No    PAD/SET Patient? No      Pain Assessment   Currently in Pain? No/denies              Social History   Tobacco Use  Smoking Status Former Smoker  . Packs/day: 1.00  . Years: 40.00  . Pack years: 40.00  . Types: Cigarettes  . Quit date: 08/2015  . Years since quitting: 4.3  Smokeless Tobacco Never Used  Tobacco Comment   Quit 2017      Goals Met:  Independence with exercise equipment Exercise tolerated well No report of cardiac concerns or symptoms  Goals Unmet:  Not Applicable  Comments: Pt able to follow exercise prescription today without complaint.  Will continue to monitor for progression.    Dr. Emily Filbert is Medical Director for [place] and [name].

## 2019-12-28 ENCOUNTER — Encounter: Payer: Medicare Other | Admitting: *Deleted

## 2019-12-28 ENCOUNTER — Other Ambulatory Visit: Payer: Self-pay

## 2019-12-28 DIAGNOSIS — I208 Other forms of angina pectoris: Secondary | ICD-10-CM | POA: Diagnosis not present

## 2019-12-28 NOTE — Progress Notes (Signed)
Daily Session Note  Patient Details  Name: Kevin Pearson MRN: [id number] Date of Birth: 14-Jun-1951 Referring Provider:     Cardiac Rehab from 11/27/2019 in [facility] Cardiac and Pulmonary Rehab  Referring Provider Ida Rogue MD      Encounter Date: 12/28/2019  Check In:  Session Check In - 12/28/19 1139      Check-In   Supervising physician immediately available to respond to emergencies See telemetry face sheet for immediately available ER MD    Location [facility]-Cardiac & Pulmonary Rehab    Staff Present Renita Papa, RN BSN;Joseph [address] [place], [place], [place], CCRP, [place], [place], ACSM CEP, Exercise Physiologist    Virtual Visit No    Medication changes reported     No    Fall or balance concerns reported    No    Warm-up and Cool-down Performed on first and last piece of equipment    Resistance Training Performed Yes    VAD Patient? No    PAD/SET Patient? No      Pain Assessment   Currently in Pain? No/denies              Social History   Tobacco Use  Smoking Status Former Smoker  . Packs/day: 1.00  . Years: 40.00  . Pack years: 40.00  . Types: Cigarettes  . Quit date: 08/2015  . Years since quitting: 4.3  Smokeless Tobacco Never Used  Tobacco Comment   Quit 2017      Goals Met:  Independence with exercise equipment Exercise tolerated well No report of cardiac concerns or symptoms Strength training completed today  Goals Unmet:  Not Applicable  Comments: Pt able to follow exercise prescription today without complaint.  Will continue to monitor for progression.    Dr. Emily Filbert is Medical Director for [place] and [name].

## 2020-01-02 ENCOUNTER — Other Ambulatory Visit: Payer: Self-pay

## 2020-01-02 ENCOUNTER — Encounter: Payer: Medicare Other | Admitting: *Deleted

## 2020-01-02 DIAGNOSIS — I208 Other forms of angina pectoris: Secondary | ICD-10-CM

## 2020-01-02 NOTE — Progress Notes (Signed)
Daily Session Note  Patient Details  Name: Kevin Pearson MRN: [id number] Date of Birth: 05-13-1951 Referring Provider:     Cardiac Rehab from 11/27/2019 in [facility] Cardiac and Pulmonary Rehab  Referring Provider Ida Rogue MD      Encounter Date: 01/02/2020  Check In:  Session Check In - 01/02/20 1121      Check-In   Supervising physician immediately available to respond to emergencies See telemetry face sheet for immediately available ER MD    Location [facility]-Cardiac & Pulmonary Rehab    Staff Present Renita Papa, RN BSN;Joseph [address] [place], [place], [place], CCRP, [place], [place], ACSM CEP, Exercise Physiologist;Kara Eliezer Bottom, MS Exercise Physiologist    Virtual Visit No    Medication changes reported     No    Fall or balance concerns reported    No    Warm-up and Cool-down Performed on first and last piece of equipment    Resistance Training Performed Yes    VAD Patient? No    PAD/SET Patient? No      Pain Assessment   Currently in Pain? No/denies              Social History   Tobacco Use  Smoking Status Former Smoker  . Packs/day: 1.00  . Years: 40.00  . Pack years: 40.00  . Types: Cigarettes  . Quit date: 08/2015  . Years since quitting: 4.4  Smokeless Tobacco Never Used  Tobacco Comment   Quit 2017      Goals Met:  Independence with exercise equipment Exercise tolerated well No report of cardiac concerns or symptoms Strength training completed today  Goals Unmet:  Not Applicable  Comments: Pt able to follow exercise prescription today without complaint.  Will continue to monitor for progression.  Reviewed home exercise with pt today.  Pt plans to join [employer] for exercise.  Reviewed THR, pulse, RPE, sign and symptoms, pulse oximetery and when to call 911 or MD.  Also discussed weather considerations and indoor options.  Pt voiced understanding.   Dr. Emily Filbert is Medical Director for [place] and [name].

## 2020-01-04 ENCOUNTER — Encounter: Payer: Medicare Other | Attending: Cardiovascular Disease | Admitting: *Deleted

## 2020-01-04 ENCOUNTER — Other Ambulatory Visit: Payer: Self-pay

## 2020-01-04 DIAGNOSIS — F419 Anxiety disorder, unspecified: Secondary | ICD-10-CM | POA: Diagnosis not present

## 2020-01-04 DIAGNOSIS — Z79899 Other long term (current) drug therapy: Secondary | ICD-10-CM | POA: Insufficient documentation

## 2020-01-04 DIAGNOSIS — J449 Chronic obstructive pulmonary disease, unspecified: Secondary | ICD-10-CM | POA: Insufficient documentation

## 2020-01-04 DIAGNOSIS — Z87820 Personal history of traumatic brain injury: Secondary | ICD-10-CM | POA: Diagnosis not present

## 2020-01-04 DIAGNOSIS — Z7901 Long term (current) use of anticoagulants: Secondary | ICD-10-CM | POA: Diagnosis not present

## 2020-01-04 DIAGNOSIS — Z87891 Personal history of nicotine dependence: Secondary | ICD-10-CM | POA: Insufficient documentation

## 2020-01-04 DIAGNOSIS — I208 Other forms of angina pectoris: Secondary | ICD-10-CM | POA: Diagnosis not present

## 2020-01-04 DIAGNOSIS — I480 Paroxysmal atrial fibrillation: Secondary | ICD-10-CM | POA: Diagnosis not present

## 2020-01-04 DIAGNOSIS — I110 Hypertensive heart disease with heart failure: Secondary | ICD-10-CM | POA: Diagnosis not present

## 2020-01-04 DIAGNOSIS — F329 Major depressive disorder, single episode, unspecified: Secondary | ICD-10-CM | POA: Insufficient documentation

## 2020-01-04 DIAGNOSIS — Z7902 Long term (current) use of antithrombotics/antiplatelets: Secondary | ICD-10-CM | POA: Insufficient documentation

## 2020-01-04 DIAGNOSIS — I509 Heart failure, unspecified: Secondary | ICD-10-CM | POA: Diagnosis not present

## 2020-01-04 NOTE — Progress Notes (Signed)
Daily Session Note  Patient Details  Name: Kevin Pearson MRN: [id number] Date of Birth: 04-20-1951 Referring Provider:     Cardiac Rehab from 11/27/2019 in [facility] Cardiac and Pulmonary Rehab  Referring Provider Ida Rogue MD      Encounter Date: 01/04/2020  Check In:  Session Check In - 01/04/20 1132      Check-In   Supervising physician immediately available to respond to emergencies See telemetry face sheet for immediately available ER MD    Location [facility]-Cardiac & Pulmonary Rehab    Staff Present Nyoka Cowden, RN, BSN, Tyna Jaksch, MS Exercise Physiologist;Amanda Oletta Darter, [place], ACSM CEP, Exercise Physiologist    Virtual Visit No    Medication changes reported     No    Fall or balance concerns reported    No    Tobacco Cessation No Change    Warm-up and Cool-down Performed on first and last piece of equipment    Resistance Training Performed Yes    VAD Patient? No      Pain Assessment   Currently in Pain? No/denies              Social History   Tobacco Use  Smoking Status Former Smoker  . Packs/day: 1.00  . Years: 40.00  . Pack years: 40.00  . Types: Cigarettes  . Quit date: 08/2015  . Years since quitting: 4.4  Smokeless Tobacco Never Used  Tobacco Comment   Quit 2017      Goals Met:  Independence with exercise equipment Exercise tolerated well No report of cardiac concerns or symptoms Strength training completed today  Goals Unmet:  Not Applicable  Comments: Pt able to follow exercise prescription today without complaint.  Will continue to monitor for progression.   Dr. Emily Filbert is Medical Director for [place] and [name].

## 2020-01-09 ENCOUNTER — Ambulatory Visit: Payer: Medicare Other | Admitting: Cardiovascular Disease

## 2020-01-09 ENCOUNTER — Encounter: Payer: Medicare Other | Admitting: *Deleted

## 2020-01-09 ENCOUNTER — Other Ambulatory Visit: Payer: Self-pay

## 2020-01-09 DIAGNOSIS — I208 Other forms of angina pectoris: Secondary | ICD-10-CM

## 2020-01-09 NOTE — Progress Notes (Signed)
Daily Session Note  Patient Details  Name: Kevin Pearson MRN: [id number] Date of Birth: Sep 17, 1950 Referring Provider:     Cardiac Rehab from 11/27/2019 in [facility] Cardiac and Pulmonary Rehab  Referring Provider Ida Rogue MD      Encounter Date: 01/09/2020  Check In:  Session Check In - 01/09/20 1159      Check-In   Supervising physician immediately available to respond to emergencies See telemetry face sheet for immediately available ER MD    Location [facility]-Cardiac & Pulmonary Rehab    Staff Present Heath Lark, RN, BSN, Jacklynn Bue, MS Exercise Physiologist;Amanda Oletta Darter, BA, ACSM CEP, Exercise Physiologist;Jessica Orion, MA, RCEP, CCRP, CCET    Virtual Visit No    Medication changes reported     No    Fall or balance concerns reported    No    Warm-up and Cool-down Performed on first and last piece of equipment    Resistance Training Performed Yes    VAD Patient? No    PAD/SET Patient? No      Pain Assessment   Currently in Pain? No/denies              Social History   Tobacco Use  Smoking Status Former Smoker  . Packs/day: 1.00  . Years: 40.00  . Pack years: 40.00  . Types: Cigarettes  . Quit date: 08/2015  . Years since quitting: 4.4  Smokeless Tobacco Never Used  Tobacco Comment   Quit 2017      Goals Met:  Independence with exercise equipment Exercise tolerated well No report of cardiac concerns or symptoms  Goals Unmet:  Not Applicable  Comments: Pt able to follow exercise prescription today without complaint.  Will continue to monitor for progression.    Dr. Emily Filbert is Medical Director for [place] and [name].

## 2020-01-10 ENCOUNTER — Ambulatory Visit (INDEPENDENT_AMBULATORY_CARE_PROVIDER_SITE_OTHER): Payer: Medicare Other | Admitting: Family

## 2020-01-10 ENCOUNTER — Other Ambulatory Visit: Payer: Self-pay

## 2020-01-10 ENCOUNTER — Encounter: Payer: Self-pay | Admitting: Family

## 2020-01-10 VITALS — BP 110/80 | HR 75 | Ht 67.5 in | Wt 232.2 lb

## 2020-01-10 DIAGNOSIS — I25118 Atherosclerotic heart disease of native coronary artery with other forms of angina pectoris: Secondary | ICD-10-CM | POA: Diagnosis not present

## 2020-01-10 DIAGNOSIS — I4821 Permanent atrial fibrillation: Secondary | ICD-10-CM

## 2020-01-10 DIAGNOSIS — I255 Ischemic cardiomyopathy: Secondary | ICD-10-CM | POA: Diagnosis not present

## 2020-01-10 DIAGNOSIS — I5022 Chronic systolic (congestive) heart failure: Secondary | ICD-10-CM | POA: Diagnosis not present

## 2020-01-10 DIAGNOSIS — Z950 Presence of cardiac pacemaker: Secondary | ICD-10-CM

## 2020-01-10 DIAGNOSIS — Z7901 Long term (current) use of anticoagulants: Secondary | ICD-10-CM

## 2020-01-10 MED ORDER — NITROGLYCERIN 0.4 MG SL SUBL: 0.4000 mg | SUBLINGUAL_TABLET | SUBLINGUAL | 11 refills | Status: AC | PRN

## 2020-01-10 NOTE — Patient Instructions (Addendum)
Medication Instructions:  No medication changes today.   We have sent a refill to your pharmacy of your Nitroglycerin.   *If you need a refill on your cardiac medications before your next appointment, please call your pharmacy*  Lab Work: No lab work today.   If you have labs (blood work) drawn today and your tests are completely normal, you will receive your results only by: Marland Kitchen MyChart Message (if you have MyChart) OR . A paper copy in the mail If you have any lab test that is abnormal or we need to change your treatment, we will call you to review the results.   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   Follow-Up: At [facility], you and your health needs are our priority.  As part of our continuing mission to provide you with exceptional heart care, we have created designated Provider Care Teams.  These Care Teams include your primary Cardiologist (physician) and Advanced Practice Providers (APPs -  Physician Assistants and Nurse Practitioners) who all work together to provide you with the care you need, when you need it.  We recommend signing up for the patient portal called "MyChart".  Sign up information is provided on this After Visit Summary.  MyChart is used to connect with patients for Virtual Visits (Telemedicine).  Patients are able to view lab/test results, encounter notes, upcoming appointments, etc.  Non-urgent messages can be sent to your provider as well.   To learn more about what you can do with MyChart, go to [email or web address].    Your next appointment:   In 6 months with Dr. Mariah Milling.   In April will with Dr. Graciela Husbands.   Other Instructions  We will send a message to the device clinic to ask them to do a remote check of your pacemaker.   Continue to wear your compression stockings during the day.   Keep up the good work with cardiac rehab.

## 2020-01-10 NOTE — Progress Notes (Signed)
Office Visit    Patient Name: Kevin Pearson Date of Encounter: 01/10/2020  Primary Care Provider:  Lowella Grip, MD Primary Cardiologist:  Julien Nordmann, MD Electrophysiologist:  None   Chief Complaint    Kevin Pearson is a 69 y.o. male with a hx of ischemic cardiomyopathy, ICD, GERD, anxiety, atrial fibrillation, depression, COPD on chronic O2, former tobacco use, HLD, hepatitis C, OSA without CPAP, lymphedema presents today for follow-up of heart failure and coronary disease  Past Medical History    Past Medical History:  Diagnosis Date  . Acid reflux   . Anxiety   . Arthritis   . Atrial fibrillation (HCC)   . CHF (congestive heart failure) (HCC)   . Chronic orthostatic hypotension   . Clotting disorder (HCC)   . COPD (chronic obstructive pulmonary disease) (HCC)   . Depression   . Elevated PSA   . Heart attack (HCC)   . Heart disease   . Heart failure (HCC)   . Hepatitis   . High cholesterol   . Hypertension   . Sleep apnea   . TBI (traumatic brain injury) (HCC)   . Urinary retention    Past Surgical History:  Procedure Laterality Date  . BLADDER SURGERY    . cardiac stents    . CYSTOSCOPY WITH URETHRAL DILATATION N/A 06/25/2016   Procedure: CYSTOSCOPY WITH URETHRAL DILATATION;  Surgeon: Vanna Scotland, MD;  Location: [facility] ORS;  Service: Urology;  Laterality: N/A;  . EP IMPLANTABLE DEVICE     [employer] Jude BiV-ICD  . ESOPHAGOGASTRODUODENOSCOPY (EGD) WITH PROPOFOL N/A 04/06/2019   Procedure: ESOPHAGOGASTRODUODENOSCOPY (EGD) WITH PROPOFOL;  Surgeon: Wyline Mood, MD;  Location: [facility] ENDOSCOPY;  Service: Gastroenterology;  Laterality: N/A;  . GREEN LIGHT LASER TURP (TRANSURETHRAL RESECTION OF PROSTATE  2016   done in [place]   . LEFT HEART CATH AND CORONARY ANGIOGRAPHY Left 10/27/2017   Procedure: LEFT HEART CATH AND CORONARY ANGIOGRAPHY;  Surgeon: Antonieta Iba, MD;  Location: [facility] INVASIVE CV LAB;  Service: Cardiovascular;  Laterality: Left;  . RIGHT/LEFT HEART CATH AND  CORONARY ANGIOGRAPHY N/A 11/16/2019   Procedure: RIGHT/LEFT HEART CATH AND CORONARY ANGIOGRAPHY;  Surgeon: Antonieta Iba, MD;  Location: [facility] INVASIVE CV LAB;  Service: Cardiovascular;  Laterality: N/A;    Allergies  Allergies  Allergen Reactions  . Mangifera Indica Anaphylaxis  . Other Anaphylaxis, Itching and Other (See Comments)    Mango skin  . Keflex [Cephalexin] Nausea Only  . Codeine Nausea And Vomiting    History of Present Illness    Kevin Pearson is a 69 y.o. male with a hx of  ischemic cardiomyopathy, ICD, GERD, anxiety, permanent atrial fibrillation on Xarelto, depression, COPD on chronic O2, former tobacco use, HLD, hepatitis C, OSA without CPAP, lymphedema, PVC.  He was last seen for cardiac catheterization 11/16/2019.  Echocardiogram 03/2019 with LVEF 45 to 50%, mild LVH, RV normal size and function, LA moderately dilated, RA mildly dilated, mild MR/TR, mildly elevated PASP.  Seen by Dr. Graciela Husbands 10/2019 with recommendation for Myoview scanning due to increased burden of ventricular ectopy.  His ventricular pacing rate was increased to try to suppress some of the ventricular ectopy.  He underwent subsequent catheterization.  He underwent cardiac catheterization 11/16/2019 showing diffuse heavily calcified vessels with patent stent to the RCA and LAD.  Known CTO of mid LCX with collaterals.  Mild to moderate in-stent restenosis of the RCA/LAD.  He was recommended for medical management.  Right heart pressures no evidence of significant pulmonary  hypertension.  LVEF 35-45% by visual estimate.  He has been participating cardiac rehab. Reports some improvement in his DOE. Reports his lower extremity edema is well controlled by wearing compression stockings during the day.   Did wake up while taking a nap one day last week "sitting straight up" and feeling chest pain and shortness of breath. This self resolved after a few minutes. Wonders if his pacemaker was doing something  different. Reports no recurrent chest pain, pressure, tightness. Endorses he may have just had a bad dream, but does not recall.   EKGs/Labs/Other Studies Reviewed:   [facility]  Prox LAD lesion is 30% stenosed.  Mid Cx lesion is 100% stenosed.  Prox Cx to Mid Cx lesion is 40% stenosed.  Mid RCA lesion is 40% stenosed.  Mid RCA to Dist RCA lesion is 40% stenosed.  Prox RCA lesion is 10% stenosed.  The left ventricular ejection fraction is 35-45% by visual estimate.  LV end diastolic pressure is normal.  There is moderate left ventricular systolic dysfunction.  LV end diastolic pressure is normal.    Left mainstem: Large vessel that bifurcates into the LAD and left circumflex, no significant disease noted  Left anterior descending (LAD): Large vessel that extends to the apical region, diagonal branch 2 of moderate size, heavily diffusely calcified vessel, proximal LAD stent is patent with 30 to 40% in-stent restenosis  Left circumflex (LCx): Large vessel with OM branch 2, moderate mid disease after takeoff of OM1 50%, occluded mid to distal left circumflex with collaterals from right to left left to left  Right coronary artery (RCA): Right dominant vessel with PL and PDA, numerous stents from proximal to distal RCA are patent, mild to moderate mid in-stent restenosis, moderate distal in-stent restenosis  Left ventriculography: Left ventricular systolic function is moderately reduced, LVEF is estimated at 40% , there is no significant mitral regurgitation , no significant aortic valve stenosis -- inferior wall hypokinesis  Right heart pressures RA mean of 2 RV 26/-1/2 PA 26/10 mean of 17 wedge mean of 8 LV 123/4/10 AO 130/68 mean 92   Final Conclusions:  Chronic stable angina Diffuse heavily calcified vessels,  patent stents in the RCA and LAD Known chronically occluded mid left circumflex with collaterals Mild up to  moderate in-stent restenosis RCA, LAD Medical management recommended Mild progression compared to prior study --Right heart pressures indicating no significant pulmonary hypertension  Recommendations:  Lifestyle modification, he has been losing weight No smoking, aggressive cholesterol management Stay on aspirin Plavix    EKG:  EKG is ordered today.  The ekg ordered today demonstrates ventricular paced rhythm 75 bpm.   Recent Labs: 01/24/2019: ALT 19 11/13/2019: BUN 20; Creatinine, Ser 1.52; Hemoglobin 13.9; Magnesium 1.5; Platelets 214; Potassium 3.3; Sodium 140  Recent Lipid Panel    Component Value Date/Time   CHOL 114 11/23/2017 1007   TRIG 119 11/23/2017 1007   HDL 30 (L) 11/23/2017 1007   CHOLHDL 3.8 11/23/2017 1007   LDLCALC 60 11/23/2017 1007    Home Medications   Current Meds  Medication Sig  . acetaminophen (TYLENOL) 325 MG tablet Take 650 mg by mouth every 6 (six) hours as needed for mild pain or headache.   . albuterol (VENTOLIN HFA) 108 (90 Base) MCG/ACT inhaler Inhale 2 puffs into the lungs every 6 (six) hours as needed for wheezing or shortness of breath.  . busPIRone (BUSPAR) 10 MG tablet Take 10 mg by mouth 3 (three)  times daily.  . Carboxymethylcell-Glycerin PF (REFRESH OPTIVE PF) 0.5-0.9 % SOLN Place 1 drop into both eyes daily as needed (Dry eye).  Marland Kitchen clopidogrel (PLAVIX) 75 MG tablet Take 75 mg by mouth daily.  . divalproex (DEPAKOTE) 250 MG DR tablet Take 750 mg by mouth 2 (two) times daily.   Marland Kitchen gabapentin (NEURONTIN) 300 MG capsule Take 600 mg by mouth 2 (two) times daily.   . Magnesium Oxide 400 MG CAPS Take 1 capsule (400 mg total) by mouth daily.  . Melatonin 5 MG TABS Take 5 mg by mouth at bedtime.   . metoprolol succinate (TOPROL-XL) 50 MG 24 hr tablet TAKE 1 TABLET BY MOUTH EVERY DAY WITH OR IMMEDIATELY AFTER MEAL (Patient taking differently: Take 50 mg by mouth daily. OR IMMEDIATELY AFTER MEAL)  . Multiple Vitamin (MULTIVITAMIN) tablet Take 1 tablet  by mouth daily.  . nitroGLYCERIN (NITROSTAT) 0.4 MG SL tablet Place 0.4 mg under the tongue every 5 (five) minutes as needed for chest pain.  Marland Kitchen omeprazole (PRILOSEC) 40 MG capsule Take 1 capsule (40 mg total) by mouth 2 (two) times daily.  . rivaroxaban (XARELTO) 20 MG TABS tablet Take 20 mg by mouth daily with supper.   . rosuvastatin (CRESTOR) 20 MG tablet Take 20 mg by mouth at bedtime.   . senna (SENOKOT) 8.6 MG tablet Take 2 tablets by mouth 2 (two) times daily.   . tamsulosin (FLOMAX) 0.4 MG CAPS capsule TAKE ONE CAPSULE BY MOUTH EVERY DAY AFTER SUPPER (Patient taking differently: Take 0.4 mg by mouth daily. )  . torsemide (DEMADEX) 20 MG tablet TAKE 2 TABLETS BY MOUTH TWICE A DAY (Patient taking differently: Take 40 mg by mouth daily. )  . traMADol (ULTRAM) 50 MG tablet Take 50 mg by mouth every 6 (six) hours as needed for moderate pain.   . traZODone (DESYREL) 100 MG tablet Take 100 mg by mouth at bedtime as needed for sleep.     Review of Systems       Review of Systems  Constitutional: Negative for chills, fever and malaise/fatigue.  Cardiovascular: Negative for chest pain, dyspnea on exertion, leg swelling, near-syncope, orthopnea, palpitations and syncope.  Respiratory: Negative for cough, shortness of breath and wheezing.   Gastrointestinal: Negative for nausea and vomiting.  Neurological: Negative for dizziness, light-headedness and weakness.   All other systems reviewed and are otherwise negative except as noted above.  Physical Exam    VS:  BP 110/80 (BP Location: Left Arm, Patient Position: Sitting, Cuff Size: Normal)   Pulse 75   Ht 5' 7.5" (1.715 m)   Wt 232 lb 4 oz (105.3 kg)   SpO2 97%   BMI 35.84 kg/m  , BMI Body mass index is 35.84 kg/m. GEN: Well nourished, overweight, well developed, in no acute distress. HEENT: normal. Neck: Supple, no JVD, carotid bruits, or masses. Cardiac: RRR, no murmurs, rubs, or gallops. No clubbing, cyanosis, edema. Radials/DP/PT  2+ and equal bilaterally.  Respiratory:  Respirations regular and unlabored, clear to auscultation bilaterally. GI: Soft, nontender, nondistended, BS + x 4. MS: No deformity or atrophy. Skin: Warm and dry, no rash. Neuro:  Strength and sensation are intact. Psych: Normal affect.  Assessment & Plan    1. Chronic systolic heart failure/ischemic cardiomyopathy - euvolemic and well compensated on exam. Continue to participate in cardiac rehab. Continue low sodium diet. No ACE/ARB due to hypotension. Continue Torsemide 40mg  twice daily. Continue to wear compression stockings.   2. CAD - Reports stable anginal  symptoms. No indication for ischemic evaluation at this time. GDMT includes beta blocker, statin, plavix, PRN nitroglycerin. Refill provided. No aspirin secondary to chronic anticoagulation. Continue to participate in cardiac rehab.  3. Permanent atrial fibrillation/chronic anticoagulation - Continue Metoprolol 50mg  daily and Xarelto 20mg  daily. Denies bleeding complications.   4. S/p PPM - Continue to follow with Dr. . Notes waking up one night approx 10 days ago with "chest pain and shortness of breath" unclear if his PPM was "going off" will send note to device clinic to request remote check of device.   5. COPD - Continue to follow with primary care.   6. Neuropathy - Continue to follow with primary care. Tells me he "fired my neurologist" about a year ago as gabapentin was not working well for him.   7. HLD, LDL goal less than 70 - Continue Rosuvastatin 20mg  daily. Plans to have labs with his primary care provider next week. If LDL not at goal, consider increasing Crestor to 40mg  daily or adding Zetia 10mg  daily.   8. CKD - Careful titration of diuretics and antihypertensives. Avoid nephrotoxic agents.   Disposition: Follow up in 6 month(s) with Dr. . Follow up in April 2022 with Dr. per recall.   , NP 01/10/2020, 11:24 AM

## 2020-01-11 ENCOUNTER — Telehealth: Payer: Self-pay

## 2020-01-11 ENCOUNTER — Encounter: Payer: Medicare Other | Admitting: *Deleted

## 2020-01-11 DIAGNOSIS — I208 Other forms of angina pectoris: Secondary | ICD-10-CM

## 2020-01-11 NOTE — Telephone Encounter (Signed)
The pt is going to call when he get home from the hospital to get help sending a transmission.

## 2020-01-11 NOTE — Telephone Encounter (Signed)
-----   Message from Alver Sorrow, NP sent at 01/10/2020  9:26 PM EDT ----- Hi!  I saw Kevin Pearson in clinic 01/10/20 and he shared with me that about 10 days ago he woke from a nap "sitting straight up" with chest pain, shortness of breath and felt like his device might have been doing "something different". Can we do a remote check on his device, please?  He didn't seen really clear in clinic on how to send an intentional report.   Thank you! Alver Sorrow, NP

## 2020-01-11 NOTE — Progress Notes (Signed)
Daily Session Note  Patient Details  Name: Kevin Pearson MRN: [id number] Date of Birth: 03/16/1951 Referring Provider:     Cardiac Rehab from 11/27/2019 in [facility]  Referring Provider Gollan, Timothy MD      Encounter Date: 01/11/2020  Check In:  Session Check In - 01/11/20 1112      Check-In   Supervising physician immediately available to respond to emergencies See telemetry face sheet for immediately available ER MD    Location [facility]    Staff Present Meredith Craven, RN BSN;Joseph Hood RCP,RRT,BSRT;Jessica Hawkins, MA, RCEP, CCRP, CCET;Amanda Sommer, BA, ACSM CEP, Exercise Physiologist    Virtual Visit No    Medication changes reported     No    Fall or balance concerns reported    No    Warm-up and Cool-down Performed on first and last piece of equipment    Resistance Training Performed Yes    VAD Patient? No    PAD/SET Patient? No      Pain Assessment   Currently in Pain? No/denies              Social History   Tobacco Use  Smoking Status Former Smoker  . Packs/day: 1.00  . Years: 40.00  . Pack years: 40.00  . Types: Cigarettes  . Quit date: 08/2015  . Years since quitting: 4.4  Smokeless Tobacco Never Used  Tobacco Comment   Quit 2017      Goals Met:  Independence with exercise equipment Exercise tolerated well No report of cardiac concerns or symptoms Strength training completed today  Goals Unmet:  Not Applicable  Comments: Pt able to follow exercise prescription today without complaint.  Will continue to monitor for progression.    Dr. Mark Miller is Medical Director for [facility] and [name]. 

## 2020-01-11 NOTE — Telephone Encounter (Signed)
LMOVM for pt to return my call. I left my direct office number for the pt.

## 2020-01-12 NOTE — Telephone Encounter (Signed)
LMOVM

## 2020-01-15 NOTE — Telephone Encounter (Signed)
Attempted to reach patient.  No answer.  Left message for patient requesting he send transmission or call us, device clinic phone number and hours provided.

## 2020-01-16 ENCOUNTER — Encounter: Payer: Medicare Other | Admitting: *Deleted

## 2020-01-16 ENCOUNTER — Other Ambulatory Visit: Payer: Self-pay

## 2020-01-16 DIAGNOSIS — I208 Other forms of angina pectoris: Secondary | ICD-10-CM

## 2020-01-16 NOTE — Progress Notes (Signed)
Daily Session Note  Patient Details  Name: Marsean Elkhatib MRN: [id number] Date of Birth: 06/04/1951 Referring Provider:     Cardiac Rehab from 11/27/2019 in [facility]  Referring Provider Ida Rogue MD      Encounter Date: 01/16/2020  Check In:  Session Check In - 01/16/20 1120      Check-In   Supervising physician immediately available to respond to emergencies See telemetry face sheet for immediately available ER MD    Location [facility]    Staff Present Heath Lark, RN, BSN, Jacklynn Bue, MS Exercise Physiologist;Amanda Oletta Darter, [place], ACSM CEP, Exercise Physiologist    Virtual Visit No    Medication changes reported     No    Fall or balance concerns reported    No    Warm-up and Cool-down Performed on first and last piece of equipment    Resistance Training Performed Yes    VAD Patient? No    PAD/SET Patient? No      Pain Assessment   Currently in Pain? No/denies              Social History   Tobacco Use  Smoking Status Former Smoker  . Packs/day: 1.00  . Years: 40.00  . Pack years: 40.00  . Types: Cigarettes  . Quit date: 08/2015  . Years since quitting: 4.4  Smokeless Tobacco Never Used  Tobacco Comment   Quit 2017      Goals Met:  Independence with exercise equipment Exercise tolerated well No report of cardiac concerns or symptoms  Goals Unmet:  Not Applicable  Comments: Pt able to follow exercise prescription today without complaint.  Will continue to monitor for progression.    Dr. Emily Filbert is Medical Director for [facility] and [name].

## 2020-01-17 ENCOUNTER — Encounter: Payer: Self-pay | Admitting: *Deleted

## 2020-01-17 DIAGNOSIS — I208 Other forms of angina pectoris: Secondary | ICD-10-CM

## 2020-01-17 NOTE — Progress Notes (Signed)
Cardiac Individual Treatment Plan  Patient Details  Name: Kevin Pearson MRN: [id number] Date of Birth: [birth year] Referring Provider:     Cardiac Rehab from 11/27/2019 in [facility] Cardiac and Pulmonary Rehab  Referring Provider Ida Rogue MD      Initial Encounter Date:    Cardiac Rehab from 11/27/2019 in [facility] Cardiac and Pulmonary Rehab  Date 11/27/19      Visit Diagnosis: Chronic stable angina ([place])  Patient's Home Medications on Admission:  Current Outpatient Medications:  .  acetaminophen (TYLENOL) 325 MG tablet, Take 650 mg by mouth every 6 (six) hours as needed for mild pain or headache. , Disp: , Rfl:  .  albuterol (VENTOLIN HFA) 108 (90 Base) MCG/ACT inhaler, Inhale 2 puffs into the lungs every 6 (six) hours as needed for wheezing or shortness of breath., Disp: 18 g, Rfl: 1 .  busPIRone (BUSPAR) 10 MG tablet, Take 10 mg by mouth 3 (three) times daily., Disp: , Rfl:  .  Carboxymethylcell-Glycerin PF (REFRESH OPTIVE PF) 0.5-0.9 % SOLN, Place 1 drop into both eyes daily as needed (Dry eye)., Disp: , Rfl:  .  clopidogrel (PLAVIX) 75 MG tablet, Take 75 mg by mouth daily., Disp: , Rfl:  .  divalproex (DEPAKOTE) 250 MG DR tablet, Take 750 mg by mouth 2 (two) times daily. , Disp: , Rfl:  .  gabapentin (NEURONTIN) 300 MG capsule, Take 600 mg by mouth 2 (two) times daily. , Disp: , Rfl:  .  Magnesium Oxide 400 MG CAPS, Take 1 capsule (400 mg total) by mouth daily., Disp: 90 capsule, Rfl: 3 .  Melatonin 5 MG TABS, Take 5 mg by mouth at bedtime. , Disp: , Rfl:  .  metoprolol succinate (TOPROL-XL) 50 MG 24 hr tablet, TAKE 1 TABLET BY MOUTH EVERY DAY WITH OR IMMEDIATELY AFTER MEAL (Patient taking differently: Take 50 mg by mouth daily. OR IMMEDIATELY AFTER MEAL), Disp: 90 tablet, Rfl: 3 .  Multiple Vitamin (MULTIVITAMIN) tablet, Take 1 tablet by mouth daily., Disp: , Rfl:  .  nitroGLYCERIN (NITROSTAT) 0.4 MG SL tablet, Place 1 tablet (0.4 mg total) under the tongue every 5 (five) minutes as  needed for chest pain., Disp: 25 tablet, Rfl: 11 .  omeprazole (PRILOSEC) 40 MG capsule, Take 1 capsule (40 mg total) by mouth 2 (two) times daily., Disp: 90 capsule, Rfl: 3 .  rivaroxaban (XARELTO) 20 MG TABS tablet, Take 20 mg by mouth daily with supper. , Disp: , Rfl:  .  rosuvastatin (CRESTOR) 20 MG tablet, Take 20 mg by mouth at bedtime. , Disp: , Rfl:  .  senna (SENOKOT) 8.6 MG tablet, Take 2 tablets by mouth 2 (two) times daily. , Disp: , Rfl:  .  tamsulosin (FLOMAX) 0.4 MG CAPS capsule, TAKE ONE CAPSULE BY MOUTH EVERY DAY AFTER SUPPER (Patient taking differently: Take 0.4 mg by mouth daily. ), Disp: 30 capsule, Rfl: 11 .  torsemide (DEMADEX) 20 MG tablet, TAKE 2 TABLETS BY MOUTH TWICE A DAY (Patient taking differently: Take 40 mg by mouth daily. ), Disp: 360 tablet, Rfl: 0 .  traMADol (ULTRAM) 50 MG tablet, Take 50 mg by mouth every 6 (six) hours as needed for moderate pain. , Disp: , Rfl:  .  traZODone (DESYREL) 100 MG tablet, Take 100 mg by mouth at bedtime as needed for sleep. , Disp: , Rfl:   Past Medical History: Past Medical History:  Diagnosis Date  . Acid reflux   . Anxiety   . Arthritis   . Atrial  fibrillation ([place])   . CHF (congestive heart failure) ([place])   . Chronic orthostatic hypotension   . Clotting disorder ([place])   . COPD (chronic obstructive pulmonary disease) ([place])   . Depression   . Elevated PSA   . Heart attack ([place])   . Heart disease   . Heart failure ([place])   . Hepatitis   . High cholesterol   . Hypertension   . Sleep apnea   . TBI (traumatic brain injury) ([place])   . Urinary retention     Tobacco Use: Social History   Tobacco Use  Smoking Status Former Smoker  . Packs/day: 1.00  . Years: 40.00  . Pack years: 40.00  . Types: Cigarettes  . Quit date: 08/2015  . Years since quitting: 4.4  Smokeless Tobacco Never Used  Tobacco Comment   Quit 2017      Labs: Recent Review Flowsheet Data    Labs for ITP Cardiac and Pulmonary Rehab Latest Ref  Rng & Units 11/23/2017   Cholestrol 100 - 199 mg/dL 114   LDLCALC 0 - 99 mg/dL 60   HDL >39 mg/dL 30(L)   Trlycerides 0 - 149 mg/dL 119       Exercise Target Goals: Exercise Program Goal: Individual exercise prescription set using results from initial 6 min walk test and THRR while considering  patient's activity barriers and safety.   Exercise Prescription Goal: Initial exercise prescription builds to 30-45 minutes a day of aerobic activity, 2-3 days per week.  Home exercise guidelines will be given to patient during program as part of exercise prescription that the participant will acknowledge.   Education: Aerobic Exercise & Resistance Training: - Gives group verbal and written instruction on the various components of exercise. Focuses on aerobic and resistive training programs and the benefits of this training and how to safely progress through these programs..   Cardiac Rehab from 03/24/2018 in [facility] Cardiac and Pulmonary Rehab  Date 02/10/18  Educator AS  Instruction Review Code 1- Verbalizes Understanding      Education: Exercise & Equipment Safety: - Individual verbal instruction and demonstration of equipment use and safety with use of the equipment.   Cardiac Rehab from 11/27/2019 in [facility] Cardiac and Pulmonary Rehab  Date 11/27/19  Educator [facility]  Instruction Review Code 1- Verbalizes Understanding      Education: Exercise Physiology & General Exercise Guidelines: - Group verbal and written instruction with models to review the exercise physiology of the cardiovascular system and associated critical values. Provides general exercise guidelines with specific guidelines to those with heart or lung disease.    Cardiac Rehab from 03/24/2018 in [facility] Cardiac and Pulmonary Rehab  Date 02/01/18  Educator [facility]  Instruction Review Code 1- Verbalizes Understanding      Education: Flexibility, Balance, Mind/Body Relaxation: Provides group verbal/written instruction on the benefits of  flexibility and balance training, including mind/body exercise modes such as yoga, pilates and tai chi.  Demonstration and skill practice provided.   Cardiac Rehab from 03/24/2018 in [facility] Cardiac and Pulmonary Rehab  Date 12/23/17  Educator AS  Instruction Review Code 1- Verbalizes Understanding      Activity Barriers & Risk Stratification:  Activity Barriers & Cardiac Risk Stratification - 11/27/19 1258      Activity Barriers & Cardiac Risk Stratification   Activity Barriers Shortness of Breath;Back Problems;Joint Problems;Decreased Ventricular Function;Deconditioning;History of Falls;Balance Concerns;Neck/Spine Problems;Muscular Weakness    Cardiac Risk Stratification High           6 Minute  Walk:  6 Minute Walk    Row Name 11/27/19 1257         6 Minute Walk   Phase Initial     Distance 1045 feet     Walk Time 6 minutes     MPH 1.98     RPE 13     Perceived Dyspnea  3     VO2 Peak 7.48     Symptoms Yes (comment)     Comments SOB, neuropathy     Resting HR 75 bpm     Resting BP 146/64     Resting Oxygen Saturation  99 %     Exercise Oxygen Saturation  during 6 min walk 99 %     Max Ex. HR 103 bpm     Max Ex. BP 126/64     2 Minute Post BP 126/64            Oxygen Initial Assessment:  Oxygen Initial Assessment - 11/22/19 1113      Home Oxygen   Home Oxygen Device Home Concentrator    Sleep Oxygen Prescription Continuous    Liters per minute 2    Home Exercise Oxygen Prescription None    Home at Rest Exercise Oxygen Prescription None    Compliance with Home Oxygen Use Yes      Intervention   Short Term Goals To learn and understand importance of monitoring SPO2 with pulse oximeter and demonstrate accurate use of the pulse oximeter.;To learn and understand importance of maintaining oxygen saturations>88%;To learn and demonstrate proper pursed lip breathing techniques or other breathing techniques.    Long  Term Goals Exhibits compliance with exercise, home  and travel O2 prescription;Verbalizes importance of monitoring SPO2 with pulse oximeter and return demonstration;Maintenance of O2 saturations>88%;Exhibits proper breathing techniques, such as pursed lip breathing or other method taught during program session           Oxygen Re-Evaluation:   Oxygen Discharge (Final Oxygen Re-Evaluation):   Initial Exercise Prescription:  Initial Exercise Prescription - 11/27/19 1300      Date of Initial Exercise RX and Referring Provider   Date 11/27/19    Referring Provider Ida Rogue MD      Treadmill   MPH 1.5    Grade 0.5    Minutes 15    METs 2.25      Recumbant Bike   Level 1    RPM 50    Watts 5    Minutes 15    METs 2      NuStep   Level 1    SPM 80    Minutes 15    METs 2      REL-XR   Level 1    Speed 50    Minutes 15    METs 2      Prescription Details   Frequency (times per week) 2    Duration Progress to 30 minutes of continuous aerobic without signs/symptoms of physical distress      Intensity   THRR 40-80% of Max Heartrate 106-137    Ratings of Perceived Exertion 11-13    Perceived Dyspnea 0-4      Progression   Progression Continue to progress workloads to maintain intensity without signs/symptoms of physical distress.      Resistance Training   Training Prescription Yes    Weight 3 lb    Reps 10-15           Perform Capillary Blood Glucose checks as needed.  Exercise Prescription Changes:  Exercise Prescription Changes    Row Name 11/27/19 1300 12/14/19 1100 01/09/20 1500         Response to Exercise   Blood Pressure (Admit) 1_0     Blood Pressure (Exercise) 126/64 112/60 128/60     Blood Pressure (Exit) 126/64 86/62 102/60     Heart Rate (Admit) 75 bpm 65 bpm 76 bpm     Heart Rate (Exercise) 103 bpm 102 bpm 127 bpm     Heart Rate (Exit) 75 bpm 75 bpm 75 bpm     Oxygen Saturation (Admit) 99 % -- --     Oxygen Saturation (Exercise) 99 % -- --     Rating of Perceived  Exertion (Exercise) _1 Perceived Dyspnea (Exercise) 3 -- --     Symptoms SOB low BP  --     Comments walk test results third full day of exercise --     Duration -- Continue with 30 min of aerobic exercise without signs/symptoms of physical distress. Continue with 30 min of aerobic exercise without signs/symptoms of physical distress.     Intensity -- THRR unchanged THRR unchanged       Progression   Progression -- Continue to progress workloads to maintain intensity without signs/symptoms of physical distress. Continue to progress workloads to maintain intensity without signs/symptoms of physical distress.     Average METs -- 2.18 2       Resistance Training   Training Prescription -- Yes Yes     Weight -- 3 lb 3 lb     Reps -- 10-15 10-15       Interval Training   Interval Training -- No No       Treadmill   MPH -- 1.5 --     Grade -- 0.5 --     Minutes -- 15 --     METs -- 2.25 --       Recumbant Bike   Level -- -- 1     RPM -- -- 50     Minutes -- -- 15     METs -- -- 2       Arm Ergometer   Level -- -- 1     Minutes -- -- 15     METs -- -- 2       REL-XR   Level -- 1 --     Minutes -- 15 --     METs -- 2.1 --            Exercise Comments:   Exercise Goals and Review:  Exercise Goals    Row Name 11/27/19 1310             Exercise Goals   Increase Physical Activity Yes       Intervention Provide advice, education, support and counseling about physical activity/exercise needs.;Develop an individualized exercise prescription for aerobic and resistive training based on initial evaluation findings, risk stratification, comorbidities and participant's personal goals.       Expected Outcomes Short Term: Attend rehab on a regular basis to increase amount of physical activity.;Long Term: Add in home exercise to make exercise part of routine and to increase amount of physical activity.;Long Term: Exercising regularly at least 3-5 days a week.       Able  to understand and use rate of perceived exertion (RPE) scale Yes       Intervention Provide education and explanation on how to  use RPE scale       Expected Outcomes Short Term: Able to use RPE daily in rehab to express subjective intensity level;Long Term:  Able to use RPE to guide intensity level when exercising independently       Able to understand and use Dyspnea scale Yes       Intervention Provide education and explanation on how to use Dyspnea scale       Expected Outcomes Short Term: Able to use Dyspnea scale daily in rehab to express subjective sense of shortness of breath during exertion;Long Term: Able to use Dyspnea scale to guide intensity level when exercising independently       Knowledge and understanding of Target Heart Rate Range (THRR) Yes       Intervention Provide education and explanation of THRR including how the numbers were predicted and where they are located for reference       Expected Outcomes Short Term: Able to state/look up THRR;Long Term: Able to use THRR to govern intensity when exercising independently;Short Term: Able to use daily as guideline for intensity in rehab       Able to check pulse independently Yes       Intervention Provide education and demonstration on how to check pulse in carotid and radial arteries.;Review the importance of being able to check your own pulse for safety during independent exercise       Expected Outcomes Short Term: Able to explain why pulse checking is important during independent exercise;Long Term: Able to check pulse independently and accurately       Understanding of Exercise Prescription Yes       Intervention Provide education, explanation, and written materials on patient's individual exercise prescription       Expected Outcomes Short Term: Able to explain program exercise prescription;Long Term: Able to explain home exercise prescription to exercise independently              Exercise Goals Re-Evaluation :  Exercise  Goals Re-Evaluation    Row Name 12/14/19 1142 12/25/19 1428 01/02/20 1135 01/09/20 1507       Exercise Goal Re-Evaluation   Exercise Goals Review Increase Physical Activity;Increase Strength and Stamina;Understanding of Exercise Prescription -- Increase Physical Activity;Increase Strength and Stamina;Understanding of Exercise Prescription Increase Physical Activity;Increase Strength and Stamina;Understanding of Exercise Prescription    Comments Ismeal is off to a good start in rehab.  He has completed his first three full days of exercise.  We will continue monitor his progress. Out since last review Reynard has returned and doing well in rehab.  He has some weights are home that he has been doing. He is considering going back to [employer] again.  We talked about starting light back on the weight machines.  He really wants his stamina back faster.  Reviewed home exercise with pt today.  Pt plans to join [employer] for exercise.  Reviewed THR, pulse, RPE, sign and symptoms, pulse oximetery and when to call 911 or MD.  Also discussed weather considerations and indoor options.  Pt voiced understanding. Cecilia is tolerating exercise well.  Staff will encourage him to increase levels on machines and weights.    Expected Outcomes Short: Continue to attend rehab regularly  Long: Continue to improve stamina. -- Short: Start getting back to gym on off days Long; Continue to improve stamina. Short: increase levels on machines Long: improve MET level           Discharge Exercise Prescription (Final Exercise Prescription  Changes):  Exercise Prescription Changes - 01/09/20 1500      Response to Exercise   Blood Pressure (Admit) 94/64    Blood Pressure (Exercise) 128/60    Blood Pressure (Exit) 102/60    Heart Rate (Admit) 76 bpm    Heart Rate (Exercise) 127 bpm    Heart Rate (Exit) 75 bpm    Rating of Perceived Exertion (Exercise) 12    Duration Continue with 30 min of aerobic exercise without  signs/symptoms of physical distress.    Intensity THRR unchanged      Progression   Progression Continue to progress workloads to maintain intensity without signs/symptoms of physical distress.    Average METs 2      Resistance Training   Training Prescription Yes    Weight 3 lb    Reps 10-15      Interval Training   Interval Training No      Recumbant Bike   Level 1    RPM 50    Minutes 15    METs 2      Arm Ergometer   Level 1    Minutes 15    METs 2           Nutrition:  Target Goals: Understanding of nutrition guidelines, daily intake of sodium <1556m, cholesterol <2019m calories 30% from fat and 7% or less from saturated fats, daily to have 5 or more servings of fruits and vegetables.  Education: Controlling Sodium/Reading Food Labels -Group verbal and written material supporting the discussion of sodium use in heart healthy nutrition. Review and explanation with models, verbal and written materials for utilization of the food label.   [facility]  Date 03/24/18  Educator LB  Instruction Review Code 1- Verbalizes Understanding      Education: General Nutrition Guidelines/Fats and Fiber: -Group instruction provided by verbal, written material, models and posters to present the general guidelines for heart healthy nutrition. Gives an explanation and review of dietary fats and fiber.   [facility]  Date 03/15/18  Educator LB  Instruction Review Code 1- Verbalizes Understanding      Biometrics:  Pre Biometrics - 11/27/19 1310      Pre Biometrics   Height 5' 7.2" (1.707 m)    Weight 228 lb 14.4 oz (103.8 kg)    BMI (Calculated) 35.63    Single Leg Stand 9.91 seconds            Nutrition Therapy Plan and Nutrition Goals:  Nutrition Therapy & Goals - 12/26/19 1128      Nutrition Therapy   Diet Heart healthy    Drug/Food Interactions  Statins/Certain Fruits    Protein (specify units) 70-75g    Fiber 30 grams    Whole Grain Foods 3 servings    Saturated Fats 12 max. grams    Fruits and Vegetables 5 servings/day    Sodium 1.5 grams      Personal Nutrition Goals   Nutrition Goal ST: add in protein (hummus + and veggies) LT: weight to 200 lbs, improve lung capactiy, and strengthen heart, increase muscle mass    Comments B: bacon egg and cheese biscuit or eggs at home and have baked bacon (omelette) with english muffin or toast - coffee and no sugar, orange juice (takes pills) L: 2 tacos - eats lots of mePolandood, ice water with lemon (sometimes beer with chips and salsa), or two  chicken enchiladas ([place] "[place]zy [place]" or "Gurrerro's"  D: Varies: chicken cattitorie (baked) - with fresh homemade sauce, and fresh vegetables, with some pasta (white). Still having dysphagia - his family got surgery. 3-4 diet pepsis and then water. His dysphagia depends on how mindful he is. Reviewed heart healthy eating. Pt reports having lots of vegetables and beans during dinner. Pt would like to increase protein.      Intervention Plan   Intervention Prescribe, educate and counsel regarding individualized specific dietary modifications aiming towards targeted core components such as weight, hypertension, lipid management, diabetes, heart failure and other comorbidities.    Expected Outcomes Short Term Goal: Understand basic principles of dietary content, such as calories, fat, sodium, cholesterol and nutrients.;Short Term Goal: A plan has been developed with personal nutrition goals set during dietitian appointment.;Long Term Goal: Adherence to prescribed nutrition plan.           Nutrition Assessments:  Nutrition Assessments - 11/27/19 1322      MEDFICTS Scores   Pre Score 61           MEDIFICTS Score Key:          ?70 Need to make dietary changes          40-70 Heart Healthy Diet         ? 40 Therapeutic Level  Cholesterol Diet  Nutrition Goals Re-Evaluation:  Nutrition Goals Re-Evaluation    [place] Name 01/02/20 1142             Goals   Nutrition Goal ST: add in protein (hummus + and veggies) LT: weight to 200 lbs, improve lung capactiy, and strengthen heart, increase muscle mass       Comment Continue to work on new changes set forth with Melissa last week.       Expected Outcome Short: Add in protein Long; Continue to work on weight loss.              Nutrition Goals Discharge (Final Nutrition Goals Re-Evaluation):  Nutrition Goals Re-Evaluation - 01/02/20 1142      Goals   Nutrition Goal ST: add in protein (hummus + and veggies) LT: weight to 200 lbs, improve lung capactiy, and strengthen heart, increase muscle mass    Comment Continue to work on new changes set forth with Melissa last week.    Expected Outcome Short: Add in protein Long; Continue to work on weight loss.           Psychosocial: Target Goals: Acknowledge presence or absence of significant depression and/or stress, maximize coping skills, provide positive support system. Participant is able to verbalize types and ability to use techniques and skills needed for reducing stress and depression.   Education: Depression - Provides group verbal and written instruction on the correlation between heart/lung disease and depressed mood, treatment options, and the stigmas associated with seeking treatment.   Cardiac Rehab from 03/24/2018 in [facility] Cardiac and Pulmonary Rehab  Date 11/17/17  Educator [facility]  Instruction Review Code 1- [employer] Understanding      Education: Sleep Hygiene -Provides group verbal and written instruction about how sleep can affect your health.  Define sleep hygiene, discuss sleep cycles and impact of sleep habits. Review good sleep hygiene tips.    Cardiac Rehab from 03/24/2018 in [facility] Cardiac and Pulmonary Rehab  Date 02/03/18  Educator [facility]  Instruction Review Code 1- Verbalizes Understanding        Education: Stress and Anxiety: - Provides group verbal  and written instruction about the health risks of elevated stress and causes of high stress.  Discuss the correlation between heart/lung disease and anxiety and treatment options. Review healthy ways to manage with stress and anxiety.   Cardiac Rehab from 10/27/2016 in [facility] Cardiac and Pulmonary Rehab  Date 08/13/16  Educator TS  Instruction Review Code (retired) 2- meets goals/outcomes       Initial Review & Psychosocial Screening:  Initial Psych Review & Screening - 11/22/19 1114      Initial Review   Current issues with History of Depression;Current Stress Concerns;Current Anxiety/Panic    Source of Stress Concerns Family    Comments Stress with his daughter.  Has chronic anxiety is controlled with meds      Family Dynamics   Good Support System? No    Concerns No support system    Comments Stress currently with his daughter. Ex Wife does not drive.    NO current support system      Barriers   Psychosocial barriers to participate in program There are no identifiable barriers or psychosocial needs.      Screening Interventions   Interventions Encouraged to exercise    Expected Outcomes Short Term goal: Utilizing psychosocial counselor, staff and physician to assist with identification of specific Stressors or current issues interfering with healing process. Setting desired goal for each stressor or current issue identified.;Long Term Goal: Stressors or current issues are controlled or eliminated.;Short Term goal: Identification and review with participant of any Quality of Life or Depression concerns found by scoring the questionnaire.;Long Term goal: The participant improves quality of Life and PHQ9 Scores as seen by post scores and/or verbalization of changes           Quality of Life Scores:   Quality of Life - 11/27/19 1311      Quality of Life   Select Quality of Life      Quality of Life Scores    Health/Function Pre 15.53 %    Socioeconomic Pre 19.5 %    Psych/Spiritual Pre 24 %    Family Pre 24.38 %    GLOBAL Pre 19.23 %          Scores of 19 and below usually indicate a poorer quality of life in these areas.  A difference of  2-3 points is a clinically meaningful difference.  A difference of 2-3 points in the total score of the Quality of Life Index has been associated with significant improvement in overall quality of life, self-image, physical symptoms, and general health in studies assessing change in quality of life.  PHQ-9: Recent Review Flowsheet Data    Depression screen [facility] 2/9 12/26/2019 11/27/2019 03/15/2018 12/27/2017 12/02/2017   Decreased Interest _0 0 2   Down, Depressed, Hopeless _1 0 2   PHQ - 2 Score _2 0 4   Altered sleeping 0 1 1 - 1   Tired, decreased energy _3 - 1   Change in appetite 0 1 1 - 1   Feeling bad or failure about yourself  0 0 1 - 1   Trouble concentrating _4 - 1   Moving slowly or fidgety/restless 0 0 3 - 1   Suicidal thoughts 0 0 0 - 0   PHQ-9 Score _5 - 10   Difficult doing work/chores Not difficult at all Somewhat difficult Somewhat difficult - Somewhat difficult     Interpretation of Total Score  Total  Score Depression Severity:  1-4 = Minimal depression, 5-9 = Mild depression, 10-14 = Moderate depression, 15-19 = Moderately severe depression, 20-27 = Severe depression   Psychosocial Evaluation and Intervention:  Psychosocial Evaluation - 11/22/19 1125      Psychosocial Evaluation & Interventions   Comments Arby has a new referral to the program after attending in 2019. He is ready to return and has no identified barriers to entry. He lives alone in an apartment complex with his dog. He has lost 41 pounds since his last time here and wants to continue his weight loss at 1 - 2 pounds a week until his goalof 200 pounds.He continues to have stressrelated to his relationship with his daughter. HIs son lives in [place]. He  wants to get stronger, breath better and continue his weight loss pattern.  He should do well with the program.    Expected Outcomes Short:  Trayven will improve his breathing with exercise.   Long:  Luciano will cope more positively with skills learned and increase his support system for more positive interactions.      Continue Psychosocial Services  Follow up required by staff           Psychosocial Re-Evaluation:  Psychosocial Re-Evaluation    Dickson Name 01/02/20 1137             Psychosocial Re-Evaluation   Current issues with Current Stress Concerns       Comments Tracey is doing well in rehab.  He thinks his gout has flared up again from extra fluid.  He continues to struggle with his relationship with his daughter but is able to talk to his son.  He is sleeping pretty good.  He is up late at night with mental disturbances from worring about his dog.  His dog got really sick last week and determined to be a diabetic.  He took him to the emergency vet for care.  Things are starting to calm down now and starting to improve.  He is 69 years old.       Expected Outcomes Short: Continue to care for dog and find outlets for stress Long; Continue to work on Radiographer, therapeutic.       Interventions Stress management education;Encouraged to attend Cardiac Rehabilitation for the exercise       Continue Psychosocial Services  Follow up required by staff              Psychosocial Discharge (Final Psychosocial Re-Evaluation):  Psychosocial Re-Evaluation - 01/02/20 1137      Psychosocial Re-Evaluation   Current issues with Current Stress Concerns    Comments Maki is doing well in rehab.  He thinks his gout has flared up again from extra fluid.  He continues to struggle with his relationship with his daughter but is able to talk to his son.  He is sleeping pretty good.  He is up late at night with mental disturbances from worring about his dog.  His dog got really sick last week and determined to be  a diabetic.  He took him to the emergency vet for care.  Things are starting to calm down now and starting to improve.  He is 69 years old.    Expected Outcomes Short: Continue to care for dog and find outlets for stress Long; Continue to work on Radiographer, therapeutic.    Interventions Stress management education;Encouraged to attend Cardiac Rehabilitation for the exercise    Continue Psychosocial Services  Follow up required by staff  Vocational Rehabilitation: Provide vocational rehab assistance to qualifying candidates.   Vocational Rehab Evaluation & Intervention:  Vocational Rehab - 11/22/19 1121      Initial Vocational Rehab Evaluation & Intervention   Assessment shows need for Vocational Rehabilitation No           Education: Education Goals: Education classes will be provided on a variety of topics geared toward better understanding of heart health and risk factor modification. Participant will state understanding/return demonstration of topics presented as noted by education test scores.  Learning Barriers/Preferences:   General Cardiac Education Topics:  AED/CPR: - Group verbal and written instruction with the use of models to demonstrate the basic use of the AED with the basic ABC's of resuscitation.   Cardiac Rehab from 03/24/2018 in [facility] Cardiac and Pulmonary Rehab  Date 03/22/18  Educator [place]  Instruction Review Code 1- Verbalizes Understanding      Anatomy & Physiology of the Heart: - Group verbal and written instruction and models provide basic cardiac anatomy and physiology, with the coronary electrical and arterial systems. Review of Valvular disease and Heart Failure   Cardiac Rehab from 03/24/2018 in [facility] Cardiac and Pulmonary Rehab  Date 02/24/18  Educator CE  Instruction Review Code 1- Verbalizes Understanding      Cardiac Procedures: - Group verbal and written instruction to review commonly prescribed medications for heart disease. Reviews the  medication, class of the drug, and side effects. Includes the steps to properly store meds and maintain the prescription regimen. (beta blockers and nitrates)   Cardiac Rehab from 03/24/2018 in [facility] Cardiac and Pulmonary Rehab  Date 03/17/18  Educator CE  Instruction Review Code 1- Verbalizes Understanding      Cardiac Medications I: - Group verbal and written instruction to review commonly prescribed medications for heart disease. Reviews the medication, class of the drug, and side effects. Includes the steps to properly store meds and maintain the prescription regimen.   Cardiac Rehab from 03/24/2018 in [facility] Cardiac and Pulmonary Rehab  Date 03/01/18  Educator SB  Instruction Review Code 1- Verbalizes Understanding      Cardiac Medications II: -Group verbal and written instruction to review commonly prescribed medications for heart disease. Reviews the medication, class of the drug, and side effects. (all other drug classes)   Cardiac Rehab from 03/24/2018 in [facility] Cardiac and Pulmonary Rehab  Date 12/16/17  Educator CE  Instruction Review Code 1- Verbalizes Understanding       Go Sex-Intimacy & Heart Disease, Get SMART - Goal Setting: - Group verbal and written instruction through game format to discuss heart disease and the return to sexual intimacy. Provides group verbal and written material to discuss and apply goal setting through the application of the S.M.A.R.T. Method.   Cardiac Rehab from 03/24/2018 in [facility] Cardiac and Pulmonary Rehab  Date 03/17/18  Educator CE  Instruction Review Code 1- Verbalizes Understanding      Other Matters of the Heart: - Provides group verbal, written materials and models to describe Stable Angina and Peripheral Artery. Includes description of the disease process and treatment options available to the cardiac patient.   Cardiac Rehab from 03/24/2018 in [facility] Cardiac and Pulmonary Rehab  Date 02/24/18  Educator CE  Instruction Review Code 1-  Verbalizes Understanding      Infection Prevention: - Provides verbal and written material to individual with discussion of infection control including proper hand washing and proper equipment cleaning during exercise session.   Cardiac Rehab from 11/27/2019 in [facility]  Cardiac and Pulmonary Rehab  Date 11/27/19  Educator [facility]  Instruction Review Code 1- Verbalizes Understanding      Falls Prevention: - Provides verbal and written material to individual with discussion of falls prevention and safety.   Cardiac Rehab from 11/27/2019 in [facility] Cardiac and Pulmonary Rehab  Date 11/27/19  Educator [facility]  Instruction Review Code 1- Verbalizes Understanding      Other: -Provides group and verbal instruction on various topics (see comments)   Knowledge Questionnaire Score:  Knowledge Questionnaire Score - 11/27/19 1322      Knowledge Questionnaire Score   Pre Score 24/26 Education Focus: A&P, nutrition           Core Components/Risk Factors/Patient Goals at Admission:  Personal Goals and Risk Factors at Admission - 11/27/19 1323      Core Components/Risk Factors/Patient Goals on Admission    Weight Management Yes;Weight Loss;Obesity    Intervention Weight Management: Develop a combined nutrition and exercise program designed to reach desired caloric intake, while maintaining appropriate intake of nutrient and fiber, sodium and fats, and appropriate energy expenditure required for the weight goal.;Weight Management/Obesity: Establish reasonable short term and long term weight goals.;Weight Management: Provide education and appropriate resources to help participant work on and attain dietary goals.;Obesity: Provide education and appropriate resources to help participant work on and attain dietary goals.    Admit Weight 228 lb 14.4 oz (103.8 kg)    Goal Weight: Short Term 223 lb (101.2 kg)    Goal Weight: Long Term 200 lb (90.7 kg)    Expected Outcomes Short Term: Continue to assess and modify  interventions until short term weight is achieved;Long Term: Adherence to nutrition and physical activity/exercise program aimed toward attainment of established weight goal;Weight Loss: Understanding of general recommendations for a balanced deficit meal plan, which promotes 1-2 lb weight loss per week and includes a negative energy balance of [phone] kcal/d;Understanding recommendations for meals to include 15-35% energy as protein, 25-35% energy from fat, 35-60% energy from carbohydrates, less than 279m of dietary cholesterol, 20-35 gm of total fiber daily;Understanding of distribution of calorie intake throughout the day with the consumption of 4-5 meals/snacks    Improve shortness of breath with ADL's Yes    Intervention Provide education, individualized exercise plan and daily activity instruction to help decrease symptoms of SOB with activities of daily living.    Expected Outcomes Short Term: Improve cardiorespiratory fitness to achieve a reduction of symptoms when performing ADLs;Long Term: Be able to perform more ADLs without symptoms or delay the onset of symptoms    Heart Failure Yes    Intervention Provide a combined exercise and nutrition program that is supplemented with education, support and counseling about heart failure. Directed toward relieving symptoms such as shortness of breath, decreased exercise tolerance, and extremity edema.    Expected Outcomes Improve functional capacity of life;Short term: Attendance in program 2-3 days a week with increased exercise capacity. Reported lower sodium intake. Reported increased fruit and vegetable intake. Reports medication compliance.;Short term: Daily weights obtained and reported for increase. Utilizing diuretic protocols set by physician.;Long term: Adoption of self-care skills and reduction of barriers for early signs and symptoms recognition and intervention leading to self-care maintenance.    Hypertension Yes    Intervention Provide  education on lifestyle modifcations including regular physical activity/exercise, weight management, moderate sodium restriction and increased consumption of fresh fruit, vegetables, and low fat dairy, alcohol moderation, and smoking cessation.;Monitor prescription use compliance.    Expected Outcomes  Short Term: Continued assessment and intervention until BP is < 140/45m HG in hypertensive participants. < 130/856mHG in hypertensive participants with diabetes, heart failure or chronic kidney disease.;Long Term: Maintenance of blood pressure at goal levels.    Lipids Yes    Intervention Provide education and support for participant on nutrition & aerobic/resistive exercise along with prescribed medications to achieve LDL <7087mHDL >23m27m  Expected Outcomes Short Term: Participant states understanding of desired cholesterol values and is compliant with medications prescribed. Participant is following exercise prescription and nutrition guidelines.;Long Term: Cholesterol controlled with medications as prescribed, with individualized exercise RX and with personalized nutrition plan. Value goals: LDL < 70mg55mL > 40 mg.           Education:Diabetes - Individual verbal and written instruction to review signs/symptoms of diabetes, desired ranges of glucose level fasting, after meals and with exercise. Acknowledge that pre and post exercise glucose checks will be done for 3 sessions at entry of program.   Education: Know Your Numbers and Risk Factors: -Group verbal and written instruction about important numbers in your health.  Discussion of what are risk factors and how they play a role in the disease process.  Review of Cholesterol, Blood Pressure, Diabetes, and BMI and the role they play in your overall health.   Cardiac Rehab from 03/24/2018 in [facility] and Pulmonary Rehab  Date 12/16/17  Educator CE  Instruction Review Code 1- Verbalizes Understanding      Core Components/Risk  Factors/Patient Goals Review:   Goals and Risk Factor Review    Row Name 01/02/20 1142             Core Components/Risk Factors/Patient Goals Review   Personal Goals Review Weight Management/Obesity;Lipids;Hypertension;Heart Failure;Improve shortness of breath with ADL's       Review CarmeTedricoing well back in rehab.  His weight is up to 231 lb this week.  He does weigh routinely at home and tracks it closely.  He also monitors his salt and fluid intake. He checks his pressure every other day at home and tracks it.  He also keeps an eye on his oxygen saturation and usually is in the upper 90s.  His breathing is doing okay and he is using his inhaler.  He still gets short of breath doing activity and really noticed it while trying to carry his dog in and out of the apartment. He still has some swelling in his ankles for his heart failure.       Expected Outcomes Short: Continue to work on weight loss Long: Continue to manage heart failure.              Core Components/Risk Factors/Patient Goals at Discharge (Final Review):   Goals and Risk Factor Review - 01/02/20 1142      Core Components/Risk Factors/Patient Goals Review   Personal Goals Review Weight Management/Obesity;Lipids;Hypertension;Heart Failure;Improve shortness of breath with ADL's    Review CarmeRossieoing well back in rehab.  His weight is up to 231 lb this week.  He does weigh routinely at home and tracks it closely.  He also monitors his salt and fluid intake. He checks his pressure every other day at home and tracks it.  He also keeps an eye on his oxygen saturation and usually is in the upper 90s.  His breathing is doing okay and he is using his inhaler.  He still gets short of breath doing activity and really noticed it while trying  to carry his dog in and out of the apartment. He still has some swelling in his ankles for his heart failure.    Expected Outcomes Short: Continue to work on weight loss Long: Continue to manage  heart failure.           ITP Comments:  ITP Comments    Row Name 11/22/19 1130 11/27/19 1256 12/20/19 0610 12/25/19 1428 01/17/20 0925   ITP Comments Virtual Orientation call completed with Copper today. He has appt on 5/24 for the EP Eval and gym orientation. Diagnosis documentation can be found in [facility] 11/13/19 office visit Completed 6MWT and gym orientation.  Initial ITP created and sent for review to Dr. Emily Filbert, Medical Director. 30 Day review completed. Medical Director ITP review done, changes made as directed, and signed approval by Medical Director. Called to check on patient.  He was out with various appointments.  He plans to be here tomorrow. 30 Day review completed. Medical Director ITP review done, changes made as directed, and signed approval by Medical Director.          Comments:

## 2020-01-18 ENCOUNTER — Encounter: Payer: Medicare Other | Admitting: *Deleted

## 2020-01-18 ENCOUNTER — Other Ambulatory Visit: Payer: Self-pay

## 2020-01-18 DIAGNOSIS — I208 Other forms of angina pectoris: Secondary | ICD-10-CM | POA: Diagnosis not present

## 2020-01-18 DIAGNOSIS — I5022 Chronic systolic (congestive) heart failure: Secondary | ICD-10-CM

## 2020-01-18 NOTE — Telephone Encounter (Signed)
Fourth attempt:  LMOVM (DPR) requesting call back to DC to assist with manual ICD transmission. Direct number and office hours provided. Next automatic 91 day transmission is scheduled for 02/09/20. If no response from pt, will await automatic transmission.

## 2020-01-18 NOTE — Progress Notes (Signed)
Daily Session Note  Patient Details  Name: Kevin Pearson MRN: [id number] Date of Birth: 1951-02-24 Referring Provider:     Cardiac Rehab from 11/27/2019 in [facility] Cardiac and Pulmonary Rehab  Referring Provider Ida Rogue MD      Encounter Date: 01/18/2020  Check In:  Session Check In - 01/18/20 1110      Check-In   Supervising physician immediately available to respond to emergencies See telemetry face sheet for immediately available ER MD    Location [facility]-Cardiac & Pulmonary Rehab    Staff Present Renita Papa, RN BSN;Joseph Hood RCP,RRT,BSRT;Melissa Florence RDN, Rowe Pavy, BA, ACSM CEP, Exercise Physiologist    Virtual Visit No    Medication changes reported     No    Fall or balance concerns reported    No    Warm-up and Cool-down Performed on first and last piece of equipment    Resistance Training Performed Yes    VAD Patient? No    PAD/SET Patient? No      Pain Assessment   Currently in Pain? No/denies              Social History   Tobacco Use  Smoking Status Former Smoker  . Packs/day: 1.00  . Years: 40.00  . Pack years: 40.00  . Types: Cigarettes  . Quit date: 08/2015  . Years since quitting: 4.4  Smokeless Tobacco Never Used  Tobacco Comment   Quit 2017      Goals Met:  Independence with exercise equipment Exercise tolerated well No report of cardiac concerns or symptoms Strength training completed today  Goals Unmet:  Not Applicable  Comments: Pt able to follow exercise prescription today without complaint.  Will continue to monitor for progression.    Dr. Emily Filbert is Medical Director for [place] and [name].

## 2020-01-19 NOTE — Telephone Encounter (Signed)
Manual transmission reviewed.  No episodes at indicated time; pt has not received a shock or ATP.    Spoke with pt and advised of transmission results

## 2020-01-19 NOTE — Telephone Encounter (Signed)
I saw where you were trying to get in touch with him. Thanks so much for the information, I appreciate it!  Alver Sorrow, NP

## 2020-01-19 NOTE — Telephone Encounter (Signed)
The pt called to get help sending a transmission. Transmission received. I told him the nurse will review it and give him a call back.

## 2020-01-23 ENCOUNTER — Other Ambulatory Visit: Payer: Self-pay

## 2020-01-23 ENCOUNTER — Encounter: Payer: Medicare Other | Admitting: *Deleted

## 2020-01-23 DIAGNOSIS — I208 Other forms of angina pectoris: Secondary | ICD-10-CM

## 2020-01-23 NOTE — Progress Notes (Signed)
Daily Session Note  Patient Details  Name: Kevin Pearson MRN: [id number] Date of Birth: Mar 28, 1951 Referring Provider:     Cardiac Rehab from 11/27/2019 in [facility] Cardiac and Pulmonary Rehab  Referring Provider Ida Rogue MD      Encounter Date: 01/23/2020  Check In:      Social History   Tobacco Use  Smoking Status Former Smoker  . Packs/day: 1.00  . Years: 40.00  . Pack years: 40.00  . Types: Cigarettes  . Quit date: 08/2015  . Years since quitting: 4.4  Smokeless Tobacco Never Used  Tobacco Comment   Quit 2017      Goals Met:  Independence with exercise equipment Exercise tolerated well Queuing for purse lip breathing No report of cardiac concerns or symptoms  Goals Unmet:  Not Applicable  Comments: Pt able to follow exercise prescription today without complaint.  Will continue to monitor for progression.    Dr. Emily Filbert is Medical Director for [place] and [name].

## 2020-01-25 ENCOUNTER — Encounter: Payer: Medicare Other | Admitting: *Deleted

## 2020-01-25 ENCOUNTER — Other Ambulatory Visit: Payer: Self-pay

## 2020-01-25 DIAGNOSIS — I208 Other forms of angina pectoris: Secondary | ICD-10-CM | POA: Diagnosis not present

## 2020-01-25 NOTE — Progress Notes (Signed)
Daily Session Note  Patient Details  Name: Kevin Pearson MRN: [id number] Date of Birth: 1950/12/05 Referring Provider:     Cardiac Rehab from 11/27/2019 in [facility] Cardiac and Pulmonary Rehab  Referring Provider Ida Rogue MD      Encounter Date: 01/25/2020  Check In:  Session Check In - 01/25/20 1114      Check-In   Supervising physician immediately available to respond to emergencies See telemetry face sheet for immediately available ER MD    Location [facility]-Cardiac & Pulmonary Rehab    Staff Present Renita Papa, RN Margurite Auerbach, MS Exercise Physiologist;Jessica Wellford, MA, RCEP, CCRP, CCET;Amanda Sommer, [place], ACSM CEP, Exercise Physiologist    Virtual Visit No    Medication changes reported     No    Fall or balance concerns reported    No    Warm-up and Cool-down Performed on first and last piece of equipment    Resistance Training Performed Yes    VAD Patient? No    PAD/SET Patient? No      Pain Assessment   Currently in Pain? No/denies              Social History   Tobacco Use  Smoking Status Former Smoker  . Packs/day: 1.00  . Years: 40.00  . Pack years: 40.00  . Types: Cigarettes  . Quit date: 08/2015  . Years since quitting: 4.4  Smokeless Tobacco Never Used  Tobacco Comment   Quit 2017      Goals Met:  Independence with exercise equipment Exercise tolerated well No report of cardiac concerns or symptoms Strength training completed today  Goals Unmet:  Not Applicable  Comments: Pt able to follow exercise prescription today without complaint.  Will continue to monitor for progression.    Dr. Emily Filbert is Medical Director for [place] and [name].

## 2020-01-30 ENCOUNTER — Encounter: Payer: Medicare Other | Admitting: *Deleted

## 2020-01-30 ENCOUNTER — Other Ambulatory Visit: Payer: Self-pay

## 2020-01-30 DIAGNOSIS — I208 Other forms of angina pectoris: Secondary | ICD-10-CM

## 2020-01-30 NOTE — Progress Notes (Signed)
Daily Session Note  Patient Details  Name: Kevin Pearson MRN: [id number] Date of Birth: 1950-10-17 Referring Provider:     Cardiac Rehab from 11/27/2019 in [facility] Cardiac and Pulmonary Rehab  Referring Provider Ida Rogue MD      Encounter Date: 01/30/2020  Check In:  Session Check In - 01/30/20 1136      Check-In   Supervising physician immediately available to respond to emergencies See telemetry face sheet for immediately available ER MD    Location [facility]-Cardiac & Pulmonary Rehab    Staff Present Heath Lark, RN, BSN, CCRP;Melissa [place] RDN, LDN;Joseph [employer], [place], ACSM CEP, Exercise Physiologist    Virtual Visit No    Medication changes reported     No    Fall or balance concerns reported    No    Warm-up and Cool-down Performed on first and last piece of equipment    Resistance Training Performed Yes    VAD Patient? No    PAD/SET Patient? No      Pain Assessment   Currently in Pain? No/denies              Social History   Tobacco Use  Smoking Status Former Smoker  . Packs/day: 1.00  . Years: 40.00  . Pack years: 40.00  . Types: Cigarettes  . Quit date: 08/2015  . Years since quitting: 4.4  Smokeless Tobacco Never Used  Tobacco Comment   Quit 2017      Goals Met:  Independence with exercise equipment Exercise tolerated well No report of cardiac concerns or symptoms  Goals Unmet:  Not Applicable  Comments: Pt able to follow exercise prescription today without complaint.  Will continue to monitor for progression.    Dr. Emily Filbert is Medical Director for [place] and [name].

## 2020-02-06 ENCOUNTER — Other Ambulatory Visit: Payer: Self-pay

## 2020-02-06 ENCOUNTER — Encounter: Payer: Medicare Other | Attending: Cardiovascular Disease | Admitting: *Deleted

## 2020-02-06 DIAGNOSIS — I480 Paroxysmal atrial fibrillation: Secondary | ICD-10-CM | POA: Insufficient documentation

## 2020-02-06 DIAGNOSIS — Z7902 Long term (current) use of antithrombotics/antiplatelets: Secondary | ICD-10-CM | POA: Diagnosis not present

## 2020-02-06 DIAGNOSIS — F419 Anxiety disorder, unspecified: Secondary | ICD-10-CM | POA: Insufficient documentation

## 2020-02-06 DIAGNOSIS — Z87891 Personal history of nicotine dependence: Secondary | ICD-10-CM | POA: Diagnosis not present

## 2020-02-06 DIAGNOSIS — Z87820 Personal history of traumatic brain injury: Secondary | ICD-10-CM | POA: Insufficient documentation

## 2020-02-06 DIAGNOSIS — I110 Hypertensive heart disease with heart failure: Secondary | ICD-10-CM | POA: Insufficient documentation

## 2020-02-06 DIAGNOSIS — I208 Other forms of angina pectoris: Secondary | ICD-10-CM | POA: Insufficient documentation

## 2020-02-06 DIAGNOSIS — J449 Chronic obstructive pulmonary disease, unspecified: Secondary | ICD-10-CM | POA: Insufficient documentation

## 2020-02-06 DIAGNOSIS — Z7901 Long term (current) use of anticoagulants: Secondary | ICD-10-CM | POA: Insufficient documentation

## 2020-02-06 DIAGNOSIS — F329 Major depressive disorder, single episode, unspecified: Secondary | ICD-10-CM | POA: Diagnosis not present

## 2020-02-06 DIAGNOSIS — Z79899 Other long term (current) drug therapy: Secondary | ICD-10-CM | POA: Diagnosis not present

## 2020-02-06 DIAGNOSIS — I509 Heart failure, unspecified: Secondary | ICD-10-CM | POA: Diagnosis not present

## 2020-02-06 NOTE — Progress Notes (Signed)
Daily Session Note  Patient Details  Name: Kevin Pearson MRN: [id number] Date of Birth: Jun 25, 1951 Referring Provider:     Cardiac Rehab from 11/27/2019 in [facility] Cardiac and Pul[facility] Rehab  Referring Provider Ida Rogue MD      Encounter Date: 02/06/2020  Check In:  Session Check In - 02/06/20 1130      Check-In   Supervising physician immediately available to respond to emergencies See telemetry face sheet for immediately available ER MD    Location [facility]-Cardiac & Pulmonary Rehab    Staff Present Heath Lark, RN, BSN, CCRP;Melissa [place] RDN, LDN;Joseph [employer], [place], ACSM CEP, Exercise Physiologist    Virtual Visit No    Medication changes reported     No    Fall or balance concerns reported    No    Warm-up and Cool-down Performed on first and last piece of equipment    Resistance Training Performed Yes    VAD Patient? No    PAD/SET Patient? No      Pain Assessment   Currently in Pain? No/denies              Social History   Tobacco Use  Smoking Status Former Smoker  . Packs/day: 1.00  . Years: 40.00  . Pack years: 40.00  . Types: Cigarettes  . Quit date: 08/2015  . Years since quitting: 4.5  Smokeless Tobacco Never Used  Tobacco Comment   Quit 2017      Goals Met:  Independence with exercise equipment Exercise tolerated well No report of cardiac concerns or symptoms  Goals Unmet:  Not Applicable  Comments: Pt able to follow exercise prescription today without complaint.  Will continue to monitor for progression.    Dr. Emily Filbert is Medical Director for [name].

## 2020-02-08 ENCOUNTER — Other Ambulatory Visit: Payer: Self-pay

## 2020-02-08 ENCOUNTER — Encounter: Payer: Medicare Other | Admitting: *Deleted

## 2020-02-08 DIAGNOSIS — I208 Other forms of angina pectoris: Secondary | ICD-10-CM

## 2020-02-08 DIAGNOSIS — I5022 Chronic systolic (congestive) heart failure: Secondary | ICD-10-CM

## 2020-02-08 NOTE — Progress Notes (Signed)
Daily Session Note  Patient Details  Name: Kevin Pearson MRN: [id number] Date of Birth: 06/10/1951 Referring Provider:     Cardiac Rehab from 11/27/2019 in [facility]  Referring Provider Ida Rogue MD      Encounter Date: 02/08/2020  Check In:  Session Check In - 02/08/20 1115      Check-In   Supervising physician immediately available to respond to emergencies See telemetry face sheet for immediately available ER MD    Location [facility]-Cardiac & Pulmonary Rehab    Staff Present Renita Papa, RN BSN;Joseph Lou Miner, [place] Exercise Physiologist;Melissa Tilford Pillar RDN, LDN    Virtual Visit No    Medication changes reported     No    Fall or balance concerns reported    No    Warm-up and Cool-down Performed on first and last piece of equipment    VAD Patient? No    PAD/SET Patient? No      Pain Assessment   Currently in Pain? No/denies              Social History   Tobacco Use  Smoking Status Former Smoker   Packs/day: 1.00   Years: 40.00   Pack years: 40.00   Types: Cigarettes   Quit date: 08/2015   Years since quitting: 4.5  Smokeless Tobacco Never Used  Tobacco Comment   Quit 2017      Goals Met:  Independence with exercise equipment Exercise tolerated well No report of cardiac concerns or symptoms Strength training completed today  Goals Unmet:  Not Applicable  Comments: Pt able to follow exercise prescription today without complaint.  Will continue to monitor for progression.    Dr. Emily Filbert is Medical Director for [name].

## 2020-02-09 ENCOUNTER — Ambulatory Visit (INDEPENDENT_AMBULATORY_CARE_PROVIDER_SITE_OTHER): Payer: Medicare Other | Admitting: *Deleted

## 2020-02-09 DIAGNOSIS — I255 Ischemic cardiomyopathy: Secondary | ICD-10-CM

## 2020-02-09 LAB — CUP PACEART REMOTE DEVICE CHECK
Battery Remaining Longevity: 20 mo
Battery Remaining Percentage: 23 %
Battery Voltage: 2.8 V
Date Time Interrogation Session: 20210806020018
HighPow Impedance: 66 Ohm
HighPow Impedance: 66 Ohm
Implantable Lead Implant Date: 20131003
Implantable Lead Implant Date: 20131003
Implantable Lead Location: 753858
Implantable Lead Location: 753860
Implantable Pulse Generator Implant Date: 20131003
Lead Channel Impedance Value: 580 Ohm
Lead Channel Impedance Value: 930 Ohm
Lead Channel Pacing Threshold Amplitude: 0.625 V
Lead Channel Pacing Threshold Amplitude: 1.25 V
Lead Channel Pacing Threshold Pulse Width: 0.5 ms
Lead Channel Pacing Threshold Pulse Width: 0.6 ms
Lead Channel Sensing Intrinsic Amplitude: 12 mV
Lead Channel Setting Pacing Amplitude: 2 V
Lead Channel Setting Pacing Amplitude: 2.25 V
Lead Channel Setting Pacing Pulse Width: 0.5 ms
Lead Channel Setting Pacing Pulse Width: 0.6 ms
Lead Channel Setting Sensing Sensitivity: 0.5 mV
Pulse Gen Serial Number: 7055215

## 2020-02-12 NOTE — Progress Notes (Signed)
Remote ICD transmission.   

## 2020-02-13 ENCOUNTER — Encounter: Payer: Medicare Other | Admitting: *Deleted

## 2020-02-13 ENCOUNTER — Other Ambulatory Visit: Payer: Self-pay

## 2020-02-13 DIAGNOSIS — I208 Other forms of angina pectoris: Secondary | ICD-10-CM | POA: Diagnosis not present

## 2020-02-13 NOTE — Progress Notes (Signed)
Daily Session Note  Patient Details  Name: Kevin Pearson MRN: [id number] Date of Birth: Apr 25, 1951 Referring Provider:     Cardiac Rehab from 11/27/2019 in [facility] Cardiac and Pulmonary Rehab  Referring Provider Ida Rogue MD      Encounter Date: 02/13/2020  Check In:      Social History   Tobacco Use  Smoking Status Former Smoker  . Packs/day: 1.00  . Years: 40.00  . Pack years: 40.00  . Types: Cigarettes  . Quit date: 08/2015  . Years since quitting: 4.5  Smokeless Tobacco Never Used  Tobacco Comment   Quit 2017      Goals Met:  Independence with exercise equipment Exercise tolerated well No report of cardiac concerns or symptoms  Goals Unmet:  Not Applicable  Comments: Pt able to follow exercise prescription today without complaint.  Will continue to monitor for progression.    Dr. Emily Filbert is Medical Director for [name].

## 2020-02-14 ENCOUNTER — Encounter: Payer: Self-pay | Admitting: *Deleted

## 2020-02-14 DIAGNOSIS — I208 Other forms of angina pectoris: Secondary | ICD-10-CM

## 2020-02-14 NOTE — Progress Notes (Signed)
Cardiac Individual Treatment Plan  Patient Details  Name: Kevin Pearson MRN: [id number] Date of Birth: [birth year] Referring Provider:     Cardiac Rehab from 11/27/2019 in [facility] Cardiac and Pulmonary Rehab  Referring Provider Ida Rogue MD      Initial Encounter Date:    Cardiac Rehab from 11/27/2019 in [facility] Cardiac and Pulmonary Rehab  Date 11/27/19      Visit Diagnosis: Chronic stable angina ([place])  Patient's Home Medications on Admission:  Current Outpatient Medications:  .  acetaminophen (TYLENOL) 325 MG tablet, Take 650 mg by mouth every 6 (six) hours as needed for mild pain or headache. , Disp: , Rfl:  .  albuterol (VENTOLIN HFA) 108 (90 Base) MCG/ACT inhaler, Inhale 2 puffs into the lungs every 6 (six) hours as needed for wheezing or shortness of breath., Disp: 18 g, Rfl: 1 .  busPIRone (BUSPAR) 10 MG tablet, Take 10 mg by mouth 3 (three) times daily., Disp: , Rfl:  .  Carboxymethylcell-Glycerin PF (REFRESH OPTIVE PF) 0.5-0.9 % SOLN, Place 1 drop into both eyes daily as needed (Dry eye)., Disp: , Rfl:  .  clopidogrel (PLAVIX) 75 MG tablet, Take 75 mg by mouth daily., Disp: , Rfl:  .  divalproex (DEPAKOTE) 250 MG DR tablet, Take 750 mg by mouth 2 (two) times daily. , Disp: , Rfl:  .  gabapentin (NEURONTIN) 300 MG capsule, Take 600 mg by mouth 2 (two) times daily. , Disp: , Rfl:  .  Magnesium Oxide 400 MG CAPS, Take 1 capsule (400 mg total) by mouth daily., Disp: 90 capsule, Rfl: 3 .  Melatonin 5 MG TABS, Take 5 mg by mouth at bedtime. , Disp: , Rfl:  .  metoprolol succinate (TOPROL-XL) 50 MG 24 hr tablet, TAKE 1 TABLET BY MOUTH EVERY DAY WITH OR IMMEDIATELY AFTER MEAL (Patient taking differently: Take 50 mg by mouth daily. OR IMMEDIATELY AFTER MEAL), Disp: 90 tablet, Rfl: 3 .  Multiple Vitamin (MULTIVITAMIN) tablet, Take 1 tablet by mouth daily., Disp: , Rfl:  .  nitroGLYCERIN (NITROSTAT) 0.4 MG SL tablet, Place 1 tablet (0.4 mg total) under the tongue every 5 (five) minutes as  needed for chest pain., Disp: 25 tablet, Rfl: 11 .  omeprazole (PRILOSEC) 40 MG capsule, Take 1 capsule (40 mg total) by mouth 2 (two) times daily., Disp: 90 capsule, Rfl: 3 .  rivaroxaban (XARELTO) 20 MG TABS tablet, Take 20 mg by mouth daily with supper. , Disp: , Rfl:  .  rosuvastatin (CRESTOR) 20 MG tablet, Take 20 mg by mouth at bedtime. , Disp: , Rfl:  .  senna (SENOKOT) 8.6 MG tablet, Take 2 tablets by mouth 2 (two) times daily. , Disp: , Rfl:  .  tamsulosin (FLOMAX) 0.4 MG CAPS capsule, TAKE ONE CAPSULE BY MOUTH EVERY DAY AFTER SUPPER (Patient taking differently: Take 0.4 mg by mouth daily. ), Disp: 30 capsule, Rfl: 11 .  torsemide (DEMADEX) 20 MG tablet, TAKE 2 TABLETS BY MOUTH TWICE A DAY (Patient taking differently: Take 40 mg by mouth daily. ), Disp: 360 tablet, Rfl: 0 .  traMADol (ULTRAM) 50 MG tablet, Take 50 mg by mouth every 6 (six) hours as needed for moderate pain. , Disp: , Rfl:  .  traZODone (DESYREL) 100 MG tablet, Take 100 mg by mouth at bedtime as needed for sleep. , Disp: , Rfl:   Past Medical History: Past Medical History:  Diagnosis Date  . Acid reflux   . Anxiety   . Arthritis   . Atrial  fibrillation ([place])   . CHF (congestive heart failure) ([place])   . Chronic orthostatic hypotension   . Clotting disorder ([place])   . COPD (chronic obstructive pulmonary disease) ([place])   . Depression   . Elevated PSA   . Heart attack ([place])   . Heart disease   . Heart failure ([place])   . Hepatitis   . High cholesterol   . Hypertension   . Sleep apnea   . TBI (traumatic brain injury) ([place])   . Urinary retention     Tobacco Use: Social History   Tobacco Use  Smoking Status Former Smoker  . Packs/day: 1.00  . Years: 40.00  . Pack years: 40.00  . Types: Cigarettes  . Quit date: 08/2015  . Years since quitting: 4.5  Smokeless Tobacco Never Used  Tobacco Comment   Quit 2017      Labs: Recent Review Flowsheet Data    Labs for ITP Cardiac and Pulmonary Rehab Latest Ref  Rng & Units 11/23/2017   Cholestrol 100 - 199 mg/dL 114   LDLCALC 0 - 99 mg/dL 60   HDL >39 mg/dL 30(L)   Trlycerides 0 - 149 mg/dL 119       Exercise Target Goals: Exercise Program Goal: Individual exercise prescription set using results from initial 6 min walk test and THRR while considering  patient's activity barriers and safety.   Exercise Prescription Goal: Initial exercise prescription builds to 30-45 minutes a day of aerobic activity, 2-3 days per week.  Home exercise guidelines will be given to patient during program as part of exercise prescription that the participant will acknowledge.   Education: Aerobic Exercise & Resistance Training: - Gives group verbal and written instruction on the various components of exercise. Focuses on aerobic and resistive training programs and the benefits of this training and how to safely progress through these programs..   Cardiac Rehab from 03/24/2018 in [facility] Cardiac and Pulmonary Rehab  Date 02/10/18  Educator AS  Instruction Review Code 1- Verbalizes Understanding      Education: Exercise & Equipment Safety: - Individual verbal instruction and demonstration of equipment use and safety with use of the equipment.   Cardiac Rehab from 01/25/2020 in [facility] Cardiac and Pulmonary Rehab  Date 11/27/19  Educator [facility]  Instruction Review Code 1- Verbalizes Understanding      Education: Exercise Physiology & General Exercise Guidelines: - Group verbal and written instruction with models to review the exercise physiology of the cardiovascular system and associated critical values. Provides general exercise guidelines with specific guidelines to those with heart or lung disease.    Cardiac Rehab from 03/24/2018 in [facility] Cardiac and Pulmonary Rehab  Date 02/01/18  Educator [facility]  Instruction Review Code 1- Verbalizes Understanding      Education: Flexibility, Balance, Mind/Body Relaxation: Provides group verbal/written instruction on the benefits of  flexibility and balance training, including mind/body exercise modes such as yoga, pilates and tai chi.  Demonstration and skill practice provided.   Cardiac Rehab from 03/24/2018 in [facility] Cardiac and Pulmonary Rehab  Date 12/23/17  Educator AS  Instruction Review Code 1- Verbalizes Understanding      Activity Barriers & Risk Stratification:  Activity Barriers & Cardiac Risk Stratification - 11/27/19 1258      Activity Barriers & Cardiac Risk Stratification   Activity Barriers Shortness of Breath;Back Problems;Joint Problems;Decreased Ventricular Function;Deconditioning;History of Falls;Balance Concerns;Neck/Spine Problems;Muscular Weakness    Cardiac Risk Stratification High           6 Minute  Walk:  6 Minute Walk    Row Name 11/27/19 1257         6 Minute Walk   Phase Initial     Distance 1045 feet     Walk Time 6 minutes     MPH 1.98     RPE 13     Perceived Dyspnea  3     VO2 Peak 7.48     Symptoms Yes (comment)     Comments SOB, neuropathy     Resting HR 75 bpm     Resting BP 146/64     Resting Oxygen Saturation  99 %     Exercise Oxygen Saturation  during 6 min walk 99 %     Max Ex. HR 103 bpm     Max Ex. BP 126/64     2 Minute Post BP 126/64            Oxygen Initial Assessment:  Oxygen Initial Assessment - 11/22/19 1113      Home Oxygen   Home Oxygen Device Home Concentrator    Sleep Oxygen Prescription Continuous    Liters per minute 2    Home Exercise Oxygen Prescription None    Home at Rest Exercise Oxygen Prescription None    Compliance with Home Oxygen Use Yes      Intervention   Short Term Goals To learn and understand importance of monitoring SPO2 with pulse oximeter and demonstrate accurate use of the pulse oximeter.;To learn and understand importance of maintaining oxygen saturations>88%;To learn and demonstrate proper pursed lip breathing techniques or other breathing techniques.    Long  Term Goals Exhibits compliance with exercise, home  and travel O2 prescription;Verbalizes importance of monitoring SPO2 with pulse oximeter and return demonstration;Maintenance of O2 saturations>88%;Exhibits proper breathing techniques, such as pursed lip breathing or other method taught during program session           Oxygen Re-Evaluation:   Oxygen Discharge (Final Oxygen Re-Evaluation):   Initial Exercise Prescription:  Initial Exercise Prescription - 11/27/19 1300      Date of Initial Exercise RX and Referring Provider   Date 11/27/19    Referring Provider Ida Rogue MD      Treadmill   MPH 1.5    Grade 0.5    Minutes 15    METs 2.25      Recumbant Bike   Level 1    RPM 50    Watts 5    Minutes 15    METs 2      NuStep   Level 1    SPM 80    Minutes 15    METs 2      REL-XR   Level 1    Speed 50    Minutes 15    METs 2      Prescription Details   Frequency (times per week) 2    Duration Progress to 30 minutes of continuous aerobic without signs/symptoms of physical distress      Intensity   THRR 40-80% of Max Heartrate 106-137    Ratings of Perceived Exertion 11-13    Perceived Dyspnea 0-4      Progression   Progression Continue to progress workloads to maintain intensity without signs/symptoms of physical distress.      Resistance Training   Training Prescription Yes    Weight 3 lb    Reps 10-15           Perform Capillary Blood Glucose checks as needed.  Exercise Prescription Changes:  Exercise Prescription Changes    Row Name 11/27/19 1300 12/14/19 1100 01/09/20 1500 01/22/20 1300 02/05/20 1700     Response to Exercise   Blood Pressure (Admit) 146/64 98/70 94/64 112/62 126/70   Blood Pressure (Exercise) 126/64 112/60 128/60 116/64 122/62   Blood Pressure (Exit) 126/64 86/62 102/60 98/64 122/64   Heart Rate (Admit) 75 bpm 65 bpm 76 bpm 75 bpm 76 bpm   Heart Rate (Exercise) 103 bpm 102 bpm 127 bpm 94 bpm 110 bpm   Heart Rate (Exit) 75 bpm 75 bpm 75 bpm 78 bpm 76 bpm   Oxygen  Saturation (Admit) 99 % -- -- -- --   Oxygen Saturation (Exercise) 99 % -- -- -- --   Rating of Perceived Exertion (Exercise) _0 Perceived Dyspnea (Exercise) 3 -- -- -- --   Symptoms SOB low BP  -- none none   Comments walk test results third full day of exercise -- -- --   Duration -- Continue with 30 min of aerobic exercise without signs/symptoms of physical distress. Continue with 30 min of aerobic exercise without signs/symptoms of physical distress. Continue with 30 min of aerobic exercise without signs/symptoms of physical distress. Continue with 30 min of aerobic exercise without signs/symptoms of physical distress.   Intensity -- THRR unchanged THRR unchanged THRR unchanged THRR unchanged     Progression   Progression -- Continue to progress workloads to maintain intensity without signs/symptoms of physical distress. Continue to progress workloads to maintain intensity without signs/symptoms of physical distress. Continue to progress workloads to maintain intensity without signs/symptoms of physical distress. Continue to progress workloads to maintain intensity without signs/symptoms of physical distress.   Average METs -- 2.18 2 2.33 2     Resistance Training   Training Prescription -- Yes Yes Yes Yes   Weight -- 3 lb 3 lb 5 lb 5 lb   Reps -- 10-15 10-15 10-15 10-15     Interval Training   Interval Training -- No No No --     Treadmill   MPH -- 1.5 -- 1.7 --   Grade -- 0.5 -- 0.5 --   Minutes -- 15 -- 15 --   METs -- 2.25 -- 2.42 --     Recumbant Bike   Level -- -- 1 2 --   RPM -- -- 50 -- --   Watts -- -- -- 19 --   Minutes -- -- 15 15 --   METs -- -- 2 2.6 --     Arm Ergometer   Level -- -- 1 -- --   Minutes -- -- 15 -- --   METs -- -- 2 -- --     REL-XR   Level -- 1 -- 3 --   Minutes -- 15 -- 15 --   METs -- 2.1 -- 2.3 --     Home Exercise Plan   Plans to continue exercise at -- -- -- [employer] (comment)  Neurosurgeon --   Frequency  -- -- -- Add 2 additional days to program exercise sessions. --   Initial Home Exercises Provided -- -- -- 12/25/19 --          Exercise Comments:   Exercise Goals and Review:  Exercise Goals    Row Name 11/27/19 1310             Exercise Goals   Increase Physical Activity Yes       Intervention  Provide advice, education, support and counseling about physical activity/exercise needs.;Develop an individualized exercise prescription for aerobic and resistive training based on initial evaluation findings, risk stratification, comorbidities and participant's personal goals.       Expected Outcomes Short Term: Attend rehab on a regular basis to increase amount of physical activity.;Long Term: Add in home exercise to make exercise part of routine and to increase amount of physical activity.;Long Term: Exercising regularly at least 3-5 days a week.       Able to understand and use rate of perceived exertion (RPE) scale Yes       Intervention Provide education and explanation on how to use RPE scale       Expected Outcomes Short Term: Able to use RPE daily in rehab to express subjective intensity level;Long Term:  Able to use RPE to guide intensity level when exercising independently       Able to understand and use Dyspnea scale Yes       Intervention Provide education and explanation on how to use Dyspnea scale       Expected Outcomes Short Term: Able to use Dyspnea scale daily in rehab to express subjective sense of shortness of breath during exertion;Long Term: Able to use Dyspnea scale to guide intensity level when exercising independently       Knowledge and understanding of Target Heart Rate Range (THRR) Yes       Intervention Provide education and explanation of THRR including how the numbers were predicted and where they are located for reference       Expected Outcomes Short Term: Able to state/look up THRR;Long Term: Able to use THRR to govern intensity when exercising  independently;Short Term: Able to use daily as guideline for intensity in rehab       Able to check pulse independently Yes       Intervention Provide education and demonstration on how to check pulse in carotid and radial arteries.;Review the importance of being able to check your own pulse for safety during independent exercise       Expected Outcomes Short Term: Able to explain why pulse checking is important during independent exercise;Long Term: Able to check pulse independently and accurately       Understanding of Exercise Prescription Yes       Intervention Provide education, explanation, and written materials on patient's individual exercise prescription       Expected Outcomes Short Term: Able to explain program exercise prescription;Long Term: Able to explain home exercise prescription to exercise independently              Exercise Goals Re-Evaluation :  Exercise Goals Re-Evaluation    Row Name 12/14/19 1142 12/25/19 1428 01/02/20 1135 01/09/20 1507 01/22/20 1306     Exercise Goal Re-Evaluation   Exercise Goals Review Increase Physical Activity;Increase Strength and Stamina;Understanding of Exercise Prescription -- Increase Physical Activity;Increase Strength and Stamina;Understanding of Exercise Prescription Increase Physical Activity;Increase Strength and Stamina;Understanding of Exercise Prescription Increase Physical Activity;Increase Strength and Stamina;Understanding of Exercise Prescription   Comments Koran is off to a good start in rehab.  He has completed his first three full days of exercise.  We will continue monitor his progress. Out since last review Ashkan has returned and doing well in rehab.  He has some weights are home that he has been doing. He is considering going back to [employer] again.  We talked about starting light back on the weight machines.  He really wants his stamina back  faster.  Reviewed home exercise with pt today.  Pt plans to join [employer]  for exercise.  Reviewed THR, pulse, RPE, sign and symptoms, pulse oximetery and when to call 911 or MD.  Also discussed weather considerations and indoor options.  Pt voiced understanding. Kalem is tolerating exercise well.  Staff will encourage him to increase levels on machines and weights. Juanluis has been doing well in rehab.  He is now up to level 3 on the XR and up to 1.7 mph on the treadmill.   We will continue to monitor his progress.   Expected Outcomes Short: Continue to attend rehab regularly  Long: Continue to improve stamina. -- Short: Start getting back to gym on off days Long; Continue to improve stamina. Short: increase levels on machines Long: improve MET level Short: Continue to increase workloads Long: Continue to improve strength and stamina.   [place] Name 01/30/20 1120             Exercise Goal Re-Evaluation   Exercise Goals Review Increase Physical Activity;Increase Strength and Stamina;Understanding of Exercise Prescription       Comments Rainier has been doing well in rehab.  He is still going to gym on off days to do 30 min on treadmill or ellipitcal.  He feels that his strength and stamina are starting to improve.  Darey was able to walk down here on his own today!!       Expected Outcomes Short: Continue to exercise on off days  Long: Continue to improve stamina.              Discharge Exercise Prescription (Final Exercise Prescription Changes):  Exercise Prescription Changes - 02/05/20 1700      Response to Exercise   Blood Pressure (Admit) 126/70    Blood Pressure (Exercise) 122/62    Blood Pressure (Exit) 122/64    Heart Rate (Admit) 76 bpm    Heart Rate (Exercise) 110 bpm    Heart Rate (Exit) 76 bpm    Rating of Perceived Exertion (Exercise) 13    Symptoms none    Duration Continue with 30 min of aerobic exercise without signs/symptoms of physical distress.    Intensity THRR unchanged      Progression   Progression Continue to progress workloads to  maintain intensity without signs/symptoms of physical distress.    Average METs 2      Resistance Training   Training Prescription Yes    Weight 5 lb    Reps 10-15           Nutrition:  Target Goals: Understanding of nutrition guidelines, daily intake of sodium <1599m, cholesterol <2071m calories 30% from fat and 7% or less from saturated fats, daily to have 5 or more servings of fruits and vegetables.  Education: Controlling Sodium/Reading Food Labels -Group verbal and written material supporting the discussion of sodium use in heart healthy nutrition. Review and explanation with models, verbal and written materials for utilization of the food label.   [facility]  Date 03/24/18  Educator LB  Instruction Review Code 1- Verbalizes Understanding      Education: General Nutrition Guidelines/Fats and Fiber: -Group instruction provided by verbal, written material, models and posters to present the general guidelines for heart healthy nutrition. Gives an explanation and review of dietary fats and fiber.   [facility]  Date 03/15/18  Educator LB  Instruction Review Code 1-  Verbalizes Understanding      Biometrics:  Pre Biometrics - 11/27/19 1310      Pre Biometrics   Height 5' 7.2" (1.707 m)    Weight 228 lb 14.4 oz (103.8 kg)    BMI (Calculated) 35.63    Single Leg Stand 9.91 seconds            Nutrition Therapy Plan and Nutrition Goals:  Nutrition Therapy & Goals - 12/26/19 1128      Nutrition Therapy   Diet Heart healthy    Drug/Food Interactions Statins/Certain Fruits    Protein (specify units) 70-75g    Fiber 30 grams    Whole Grain Foods 3 servings    Saturated Fats 12 max. grams    Fruits and Vegetables 5 servings/day    Sodium 1.5 grams      Personal Nutrition Goals   Nutrition Goal ST: add in protein (hummus + and veggies) LT: weight to 200 lbs,  improve lung capactiy, and strengthen heart, increase muscle mass    Comments B: bacon egg and cheese biscuit or eggs at home and have baked bacon (omelette) with english muffin or toast - coffee and no sugar, orange juice (takes pills) L: 2 tacos - eats lots of [place] food, ice water with lemon (sometimes beer with chips and salsa), or two chicken enchiladas ([place] - restaurant "[employer] [place]" or "[employer]"  D: Varies: chicken cattitorie (baked) - with fresh homemade sauce, and fresh vegetables, with some pasta (white). Still having dysphagia - his family got surgery. 3-4 diet pepsis and then water. His dysphagia depends on how mindful he is. Reviewed heart healthy eating. Pt reports having lots of vegetables and beans during dinner. Pt would like to increase protein.      Intervention Plan   Intervention Prescribe, educate and counsel regarding individualized specific dietary modifications aiming towards targeted core components such as weight, hypertension, lipid management, diabetes, heart failure and other comorbidities.    Expected Outcomes Short Term Goal: Understand basic principles of dietary content, such as calories, fat, sodium, cholesterol and nutrients.;Short Term Goal: A plan has been developed with personal nutrition goals set during dietitian appointment.;Long Term Goal: Adherence to prescribed nutrition plan.           Nutrition Assessments:  Nutrition Assessments - 11/27/19 1322      MEDFICTS Scores   Pre Score 61           MEDIFICTS Score Key:          ?70 Need to make dietary changes          40-70 Heart Healthy Diet         ? 40 Therapeutic Level Cholesterol Diet  Nutrition Goals Re-Evaluation:  Nutrition Goals Re-Evaluation    [place] Name 01/02/20 1142 01/30/20 1121           Goals   Nutrition Goal ST: add in protein (hummus + and veggies) LT: weight to 200 lbs, improve lung capactiy, and strengthen heart, increase muscle mass Heart healthier eating       Comment Continue to work on new changes set forth with Melissa last week. Cleophas is meeting with a nutritionist on his own this afternoon      Expected Outcome Short: Add in protein Long; Continue to work on weight loss. Short: Add in protein Long; Continue to work on weight loss.             Nutrition Goals Discharge (Final Nutrition Goals Re-Evaluation):  Nutrition  Goals Re-Evaluation - 01/30/20 1121      Goals   Nutrition Goal Heart healthier eating    Comment Kel is meeting with a nutritionist on his own this afternoon    Expected Outcome Short: Add in protein Long; Continue to work on weight loss.           Psychosocial: Target Goals: Acknowledge presence or absence of significant depression and/or stress, maximize coping skills, provide positive support system. Participant is able to verbalize types and ability to use techniques and skills needed for reducing stress and depression.   Education: Depression - Provides group verbal and written instruction on the correlation between heart/lung disease and depressed mood, treatment options, and the stigmas associated with seeking treatment.   Cardiac Rehab from 03/24/2018 in [facility] Cardiac and Pulmonary Rehab  Date 11/17/17  Educator [facility]  Instruction Review Code 1- [employer] Understanding      Education: Sleep Hygiene -Provides group verbal and written instruction about how sleep can affect your health.  Define sleep hygiene, discuss sleep cycles and impact of sleep habits. Review good sleep hygiene tips.    Cardiac Rehab from 03/24/2018 in [facility] Cardiac and Pulmonary Rehab  Date 02/03/18  Educator [facility]  Instruction Review Code 1- Verbalizes Understanding       Education: Stress and Anxiety: - Provides group verbal and written instruction about the health risks of elevated stress and causes of high stress.  Discuss the correlation between heart/lung disease and anxiety and treatment options. Review healthy ways to manage with  stress and anxiety.   Cardiac Rehab from 10/27/2016 in [facility] Cardiac and Pulmonary Rehab  Date 08/13/16  Educator TS  Instruction Review Code (retired) 2- meets goals/outcomes       Initial Review & Psychosocial Screening:  Initial Psych Review & Screening - 11/22/19 1114      Initial Review   Current issues with History of Depression;Current Stress Concerns;Current Anxiety/Panic    Source of Stress Concerns Family    Comments Stress with his daughter.  Has chronic anxiety is controlled with meds      Family Dynamics   Good Support System? No    Concerns No support system    Comments Stress currently with his daughter. Ex Wife does not drive.    NO current support system      Barriers   Psychosocial barriers to participate in program There are no identifiable barriers or psychosocial needs.      Screening Interventions   Interventions Encouraged to exercise    Expected Outcomes Short Term goal: Utilizing psychosocial counselor, staff and physician to assist with identification of specific Stressors or current issues interfering with healing process. Setting desired goal for each stressor or current issue identified.;Long Term Goal: Stressors or current issues are controlled or eliminated.;Short Term goal: Identification and review with participant of any Quality of Life or Depression concerns found by scoring the questionnaire.;Long Term goal: The participant improves quality of Life and PHQ9 Scores as seen by post scores and/or verbalization of changes           Quality of Life Scores:   Quality of Life - 11/27/19 1311      Quality of Life   Select Quality of Life      Quality of Life Scores   Health/Function Pre 15.53 %    Socioeconomic Pre 19.5 %    Psych/Spiritual Pre 24 %    Family Pre 24.38 %    GLOBAL Pre 19.23 %  Scores of 19 and below usually indicate a poorer quality of life in these areas.  A difference of  2-3 points is a clinically meaningful  difference.  A difference of 2-3 points in the total score of the Quality of Life Index has been associated with significant improvement in overall quality of life, self-image, physical symptoms, and general health in studies assessing change in quality of life.  PHQ-9: Recent Review Flowsheet Data    Depression screen [facility] 2/9 12/26/2019 11/27/2019 03/15/2018 12/27/2017 12/02/2017   Decreased Interest _0 0 2   Down, Depressed, Hopeless _1 0 2   PHQ - 2 Score _2 0 4   Altered sleeping 0 1 1 - 1   Tired, decreased energy _3 - 1   Change in appetite 0 1 1 - 1   Feeling bad or failure about yourself  0 0 1 - 1   Trouble concentrating _4 - 1   Moving slowly or fidgety/restless 0 0 3 - 1   Suicidal thoughts 0 0 0 - 0   PHQ-9 Score _5 - 10   Difficult doing work/chores Not difficult at all Somewhat difficult Somewhat difficult - Somewhat difficult     Interpretation of Total Score  Total Score Depression Severity:  1-4 = Minimal depression, 5-9 = Mild depression, 10-14 = Moderate depression, 15-19 = Moderately severe depression, 20-27 = Severe depression   Psychosocial Evaluation and Intervention:  Psychosocial Evaluation - 11/22/19 1125      Psychosocial Evaluation & Interventions   Comments Seaborn has a new referral to the program after attending in 2019. He is ready to return and has no identified barriers to entry. He lives alone in an apartment complex with his dog. He has lost 41 pounds since his last time here and wants to continue his weight loss at 1 - 2 pounds a week until his goalof 200 pounds.He continues to have stressrelated to his relationship with his daughter. HIs son lives in [place]. He wants to get stronger, breath better and continue his weight loss pattern.  He should do well with the program.    Expected Outcomes Short:  Himmat will improve his breathing with exercise.   Long:  Kree will cope more positively with skills learned and increase his support  system for more positive interactions.      Continue Psychosocial Services  Follow up required by staff           Psychosocial Re-Evaluation:  Psychosocial Re-Evaluation    Pollard Name 01/02/20 1137 01/30/20 1123           Psychosocial Re-Evaluation   Current issues with Current Stress Concerns Current Stress Concerns      Comments Abdulkadir is doing well in rehab.  He thinks his gout has flared up again from extra fluid.  He continues to struggle with his relationship with his daughter but is able to talk to his son.  He is sleeping pretty good.  He is up late at night with mental disturbances from worring about his dog.  His dog got really sick last week and determined to be a diabetic.  He took him to the emergency vet for care.  Things are starting to calm down now and starting to improve.  He is 69 years old. Jaysten is doing well.    He is now giving his dog shots daily which neither of them like.  The dog is also  having some incontenence issues now and needs to clean the floor.  He is doing what he can to make his dog comfortable.   He is sleeping well.  He just lost his friend yesterday and they are still not sure what really caused his death.      Expected Outcomes Short: Continue to care for dog and find outlets for stress Long; Continue to work on Radiographer, therapeutic. Short; Continue to care for dog.  Long; Continue to use exercise for mental boost.      Interventions Stress management education;Encouraged to attend Cardiac Rehabilitation for the exercise Stress management education;Encouraged to attend Cardiac Rehabilitation for the exercise      Continue Psychosocial Services  Follow up required by staff Follow up required by staff             Psychosocial Discharge (Final Psychosocial Re-Evaluation):  Psychosocial Re-Evaluation - 01/30/20 1123      Psychosocial Re-Evaluation   Current issues with Current Stress Concerns    Comments Dearl is doing well.    He is now giving his dog  shots daily which neither of them like.  The dog is also having some incontenence issues now and needs to clean the floor.  He is doing what he can to make his dog comfortable.   He is sleeping well.  He just lost his friend yesterday and they are still not sure what really caused his death.    Expected Outcomes Short; Continue to care for dog.  Long; Continue to use exercise for mental boost.    Interventions Stress management education;Encouraged to attend Cardiac Rehabilitation for the exercise    Continue Psychosocial Services  Follow up required by staff           Vocational Rehabilitation: Provide vocational rehab assistance to qualifying candidates.   Vocational Rehab Evaluation & Intervention:  Vocational Rehab - 11/22/19 1121      Initial Vocational Rehab Evaluation & Intervention   Assessment shows need for Vocational Rehabilitation No           Education: Education Goals: Education classes will be provided on a variety of topics geared toward better understanding of heart health and risk factor modification. Participant will state understanding/return demonstration of topics presented as noted by education test scores.  Learning Barriers/Preferences:   General Cardiac Education Topics:  AED/CPR: - Group verbal and written instruction with the use of models to demonstrate the basic use of the AED with the basic ABC's of resuscitation.   Cardiac Rehab from 03/24/2018 in [facility] Cardiac and Pulmonary Rehab  Date 03/22/18  Educator [place]  Instruction Review Code 1- Verbalizes Understanding      Anatomy & Physiology of the Heart: - Group verbal and written instruction and models provide basic cardiac anatomy and physiology, with the coronary electrical and arterial systems. Review of Valvular disease and Heart Failure   Cardiac Rehab from 03/24/2018 in [facility] Cardiac and Pulmonary Rehab  Date 02/24/18  Educator CE  Instruction Review Code 1- Verbalizes Understanding       Cardiac Procedures: - Group verbal and written instruction to review commonly prescribed medications for heart disease. Reviews the medication, class of the drug, and side effects. Includes the steps to properly store meds and maintain the prescription regimen. (beta blockers and nitrates)   Cardiac Rehab from 03/24/2018 in [facility] Cardiac and Pulmonary Rehab  Date 03/17/18  Educator CE  Instruction Review Code 1- Verbalizes Understanding      Cardiac Medications I: -  Group verbal and written instruction to review commonly prescribed medications for heart disease. Reviews the medication, class of the drug, and side effects. Includes the steps to properly store meds and maintain the prescription regimen.   Cardiac Rehab from 03/24/2018 in [facility] Cardiac and Pulmonary Rehab  Date 03/01/18  Educator SB  Instruction Review Code 1- Verbalizes Understanding      Cardiac Medications II: -Group verbal and written instruction to review commonly prescribed medications for heart disease. Reviews the medication, class of the drug, and side effects. (all other drug classes)   Cardiac Rehab from 03/24/2018 in [facility] Cardiac and Pulmonary Rehab  Date 12/16/17  Educator CE  Instruction Review Code 1- Verbalizes Understanding       Go Sex-Intimacy & Heart Disease, Get SMART - Goal Setting: - Group verbal and written instruction through game format to discuss heart disease and the return to sexual intimacy. Provides group verbal and written material to discuss and apply goal setting through the application of the S.M.A.R.T. Method.   Cardiac Rehab from 03/24/2018 in [facility] Cardiac and Pulmonary Rehab  Date 03/17/18  Educator CE  Instruction Review Code 1- Verbalizes Understanding      Other Matters of the Heart: - Provides group verbal, written materials and models to describe Stable Angina and Peripheral Artery. Includes description of the disease process and treatment options available to the cardiac  patient.   Cardiac Rehab from 03/24/2018 in [facility] Cardiac and Pulmonary Rehab  Date 02/24/18  Educator CE  Instruction Review Code 1- Verbalizes Understanding      Infection Prevention: - Provides verbal and written material to individual with discussion of infection control including proper hand washing and proper equipment cleaning during exercise session.   Cardiac Rehab from 01/25/2020 in [facility] Cardiac and Pulmonary Rehab  Date 11/27/19  Educator [facility]  Instruction Review Code 1- Verbalizes Understanding      Falls Prevention: - Provides verbal and written material to individual with discussion of falls prevention and safety.   Cardiac Rehab from 01/25/2020 in [facility] Cardiac and Pulmonary Rehab  Date 11/27/19  Educator [facility]  Instruction Review Code 1- Verbalizes Understanding      Other: -Provides group and verbal instruction on various topics (see comments)   Knowledge Questionnaire Score:  Knowledge Questionnaire Score - 11/27/19 1322      Knowledge Questionnaire Score   Pre Score 24/26 Education Focus: A&P, nutrition           Core Components/Risk Factors/Patient Goals at Admission:  Personal Goals and Risk Factors at Admission - 11/27/19 1323      Core Components/Risk Factors/Patient Goals on Admission    Weight Management Yes;Weight Loss;Obesity    Intervention Weight Management: Develop a combined nutrition and exercise program designed to reach desired caloric intake, while maintaining appropriate intake of nutrient and fiber, sodium and fats, and appropriate energy expenditure required for the weight goal.;Weight Management/Obesity: Establish reasonable short term and long term weight goals.;Weight Management: Provide education and appropriate resources to help participant work on and attain dietary goals.;Obesity: Provide education and appropriate resources to help participant work on and attain dietary goals.    Admit Weight 228 lb 14.4 oz (103.8 kg)    Goal Weight:  Short Term 223 lb (101.2 kg)    Goal Weight: Long Term 200 lb (90.7 kg)    Expected Outcomes Short Term: Continue to assess and modify interventions until short term weight is achieved;Long Term: Adherence to nutrition and physical activity/exercise program aimed toward attainment of  established weight goal;Weight Loss: Understanding of general recommendations for a balanced deficit meal plan, which promotes 1-2 lb weight loss per week and includes a negative energy balance of [phone] kcal/d;Understanding recommendations for meals to include 15-35% energy as protein, 25-35% energy from fat, 35-60% energy from carbohydrates, less than '200mg'$  of dietary cholesterol, 20-35 gm of total fiber daily;Understanding of distribution of calorie intake throughout the day with the consumption of 4-5 meals/snacks    Improve shortness of breath with ADL's Yes    Intervention Provide education, individualized exercise plan and daily activity instruction to help decrease symptoms of SOB with activities of daily living.    Expected Outcomes Short Term: Improve cardiorespiratory fitness to achieve a reduction of symptoms when performing ADLs;Long Term: Be able to perform more ADLs without symptoms or delay the onset of symptoms    Heart Failure Yes    Intervention Provide a combined exercise and nutrition program that is supplemented with education, support and counseling about heart failure. Directed toward relieving symptoms such as shortness of breath, decreased exercise tolerance, and extremity edema.    Expected Outcomes Improve functional capacity of life;Short term: Attendance in program 2-3 days a week with increased exercise capacity. Reported lower sodium intake. Reported increased fruit and vegetable intake. Reports medication compliance.;Short term: Daily weights obtained and reported for increase. Utilizing diuretic protocols set by physician.;Long term: Adoption of self-care skills and reduction of barriers for  early signs and symptoms recognition and intervention leading to self-care maintenance.    Hypertension Yes    Intervention Provide education on lifestyle modifcations including regular physical activity/exercise, weight management, moderate sodium restriction and increased consumption of fresh fruit, vegetables, and low fat dairy, alcohol moderation, and smoking cessation.;Monitor prescription use compliance.    Expected Outcomes Short Term: Continued assessment and intervention until BP is < 140/53m HG in hypertensive participants. < 130/831mHG in hypertensive participants with diabetes, heart failure or chronic kidney disease.;Long Term: Maintenance of blood pressure at goal levels.    Lipids Yes    Intervention Provide education and support for participant on nutrition & aerobic/resistive exercise along with prescribed medications to achieve LDL '70mg'$ , HDL >'40mg'$ .    Expected Outcomes Short Term: Participant states understanding of desired cholesterol values and is compliant with medications prescribed. Participant is following exercise prescription and nutrition guidelines.;Long Term: Cholesterol controlled with medications as prescribed, with individualized exercise RX and with personalized nutrition plan. Value goals: LDL < '70mg'$ , HDL > 40 mg.           Education:Diabetes - Individual verbal and written instruction to review signs/symptoms of diabetes, desired ranges of glucose level fasting, after meals and with exercise. Acknowledge that pre and post exercise glucose checks will be done for 3 sessions at entry of program.   Education: Know Your Numbers and Risk Factors: -Group verbal and written instruction about important numbers in your health.  Discussion of what are risk factors and how they play a role in the disease process.  Review of Cholesterol, Blood Pressure, Diabetes, and BMI and the role they play in your overall health.   [facility]  Date 12/16/17  Educator CE  Instruction Review Code 1- Verbalizes Understanding      Core Components/Risk Factors/Patient Goals Review:   Goals and Risk Factor Review    Row Name 01/02/20 1142 01/30/20 1121           Core Components/Risk Factors/Patient Goals Review   Personal Goals  Review Weight Management/Obesity;Lipids;Hypertension;Heart Failure;Improve shortness of breath with ADL's Weight Management/Obesity;Lipids;Hypertension;Heart Failure;Improve shortness of breath with ADL's      Review Samule is doing well back in rehab.  His weight is up to 231 lb this week.  He does weigh routinely at home and tracks it closely.  He also monitors his salt and fluid intake. He checks his pressure every other day at home and tracks it.  He also keeps an eye on his oxygen saturation and usually is in the upper 90s.  His breathing is doing okay and he is using his inhaler.  He still gets short of breath doing activity and really noticed it while trying to carry his dog in and out of the apartment. He still has some swelling in his ankles for his heart failure. Jasiel continues to work on weight loss.  His pressures have been doing well and he denies any heart failure symptoms.  His biggest problem continues to be his breathing.  He continues to try to improve.  He feels better overall, but still wants to do more.      Expected Outcomes Short: Continue to work on weight loss Long: Continue to manage heart failure. Short: Continue to work on on weight loss Long; Continue to monitor risk factors.             Core Components/Risk Factors/Patient Goals at Discharge (Final Review):   Goals and Risk Factor Review - 01/30/20 1121      Core Components/Risk Factors/Patient Goals Review   Personal Goals Review Weight Management/Obesity;Lipids;Hypertension;Heart Failure;Improve shortness of breath with ADL's    Review Antavius continues to work on weight loss.  His pressures have been doing  well and he denies any heart failure symptoms.  His biggest problem continues to be his breathing.  He continues to try to improve.  He feels better overall, but still wants to do more.    Expected Outcomes Short: Continue to work on on weight loss Long; Continue to monitor risk factors.           ITP Comments:  ITP Comments    Row Name 11/22/19 1130 11/27/19 1256 12/20/19 0610 12/25/19 1428 01/17/20 0925   ITP Comments Virtual Orientation call completed with Keagen today. He has appt on 5/24 for the EP Eval and gym orientation. Diagnosis documentation can be found in [facility] 11/13/19 office visit Completed 6MWT and gym orientation.  Initial ITP created and sent for review to Dr. Emily Filbert, Medical Director. 30 Day review completed. Medical Director ITP review done, changes made as directed, and signed approval by Medical Director. Called to check on patient.  He was out with various appointments.  He plans to be here tomorrow. 30 Day review completed. Medical Director ITP review done, changes made as directed, and signed approval by Medical Director.   Sonoita Name 02/14/20 0608           ITP Comments 30 Day review completed. Medical Director ITP review done, changes made as directed, and signed approval by Medical Director.              Comments:

## 2020-02-15 ENCOUNTER — Encounter: Payer: Medicare Other | Admitting: *Deleted

## 2020-02-15 ENCOUNTER — Other Ambulatory Visit: Payer: Self-pay

## 2020-02-15 DIAGNOSIS — I208 Other forms of angina pectoris: Secondary | ICD-10-CM

## 2020-02-15 NOTE — Progress Notes (Signed)
Daily Session Note  Patient Details  Name: Astin Sayre MRN: [id number] Date of Birth: 1950-07-09 Referring Provider:     Cardiac Rehab from 11/27/2019 in [facility] Cardiac and Pulmonary Rehab  Referring Provider Ida Rogue MD      Encounter Date: 02/15/2020  Check In:  Session Check In - 02/15/20 1106      Check-In   Supervising physician immediately available to respond to emergencies See telemetry face sheet for immediately available ER MD    Location [facility]-Cardiac & Pulmonary Rehab    Staff Present Renita Papa, RN BSN;Jessica Luan Pulling, MA, RCEP, CCRP, Marylynn Pearson, MS Exercise Physiologist;Susanne Bice, RN, BSN, CCRP;Melissa Caiola RDN, LDN    Virtual Visit No    Medication changes reported     No    Fall or balance concerns reported    No    Warm-up and Cool-down Performed on first and last piece of equipment    Resistance Training Performed Yes    VAD Patient? No    PAD/SET Patient? No      Pain Assessment   Currently in Pain? No/denies              Social History   Tobacco Use  Smoking Status Former Smoker  . Packs/day: 1.00  . Years: 40.00  . Pack years: 40.00  . Types: Cigarettes  . Quit date: 08/2015  . Years since quitting: 4.5  Smokeless Tobacco Never Used  Tobacco Comment   Quit 2017      Goals Met:  Independence with exercise equipment Exercise tolerated well No report of cardiac concerns or symptoms Strength training completed today  Goals Unmet:  Not Applicable  Comments: Pt able to follow exercise prescription today without complaint.  Will continue to monitor for progression.    Dr. Emily Filbert is Medical Director for [name].

## 2020-03-07 ENCOUNTER — Encounter: Payer: Self-pay | Admitting: *Deleted

## 2020-03-07 ENCOUNTER — Telehealth: Payer: Self-pay | Admitting: *Deleted

## 2020-03-07 DIAGNOSIS — I208 Other forms of angina pectoris: Secondary | ICD-10-CM

## 2020-03-07 NOTE — Telephone Encounter (Signed)
Called to check on patient.  He is still down in [place] and plans to return around 9/15.

## 2020-03-13 ENCOUNTER — Encounter: Payer: Self-pay | Admitting: *Deleted

## 2020-03-13 DIAGNOSIS — I208 Other forms of angina pectoris: Secondary | ICD-10-CM

## 2020-03-13 NOTE — Progress Notes (Signed)
Cardiac Individual Treatment Plan  Patient Details  Name: Kevin Pearson MRN: [id number] Date of Birth: [birth year] Referring Provider:     Cardiac Rehab from 11/27/2019 in [facility] Cardiac and Pulmonary Rehab  Referring Provider Ida Rogue MD      Initial Encounter Date:    Cardiac Rehab from 11/27/2019 in [facility] Cardiac and Pulmonary Rehab  Date 11/27/19      Visit Diagnosis: Chronic stable angina ([place])  Patient's Home Medications on Admission:  Current Outpatient Medications:    acetaminophen (TYLENOL) 325 MG tablet, Take 650 mg by mouth every 6 (six) hours as needed for mild pain or headache. , Disp: , Rfl:    albuterol (VENTOLIN HFA) 108 (90 Base) MCG/ACT inhaler, Inhale 2 puffs into the lungs every 6 (six) hours as needed for wheezing or shortness of breath., Disp: 18 g, Rfl: 1   busPIRone (BUSPAR) 10 MG tablet, Take 10 mg by mouth 3 (three) times daily., Disp: , Rfl:    Carboxymethylcell-Glycerin PF (REFRESH OPTIVE PF) 0.5-0.9 % SOLN, Place 1 drop into both eyes daily as needed (Dry eye)., Disp: , Rfl:    clopidogrel (PLAVIX) 75 MG tablet, Take 75 mg by mouth daily., Disp: , Rfl:    divalproex (DEPAKOTE) 250 MG DR tablet, Take 750 mg by mouth 2 (two) times daily. , Disp: , Rfl:    gabapentin (NEURONTIN) 300 MG capsule, Take 600 mg by mouth 2 (two) times daily. , Disp: , Rfl:    Magnesium Oxide 400 MG CAPS, Take 1 capsule (400 mg total) by mouth daily., Disp: 90 capsule, Rfl: 3   Melatonin 5 MG TABS, Take 5 mg by mouth at bedtime. , Disp: , Rfl:    metoprolol succinate (TOPROL-XL) 50 MG 24 hr tablet, TAKE 1 TABLET BY MOUTH EVERY DAY WITH OR IMMEDIATELY AFTER MEAL (Patient taking differently: Take 50 mg by mouth daily. OR IMMEDIATELY AFTER MEAL), Disp: 90 tablet, Rfl: 3   Multiple Vitamin (MULTIVITAMIN) tablet, Take 1 tablet by mouth daily., Disp: , Rfl:    nitroGLYCERIN (NITROSTAT) 0.4 MG SL tablet, Place 1 tablet (0.4 mg total) under the tongue every 5 (five) minutes as  needed for chest pain., Disp: 25 tablet, Rfl: 11   omeprazole (PRILOSEC) 40 MG capsule, Take 1 capsule (40 mg total) by mouth 2 (two) times daily., Disp: 90 capsule, Rfl: 3   rivaroxaban (XARELTO) 20 MG TABS tablet, Take 20 mg by mouth daily with supper. , Disp: , Rfl:    rosuvastatin (CRESTOR) 20 MG tablet, Take 20 mg by mouth at bedtime. , Disp: , Rfl:    senna (SENOKOT) 8.6 MG tablet, Take 2 tablets by mouth 2 (two) times daily. , Disp: , Rfl:    tamsulosin (FLOMAX) 0.4 MG CAPS capsule, TAKE ONE CAPSULE BY MOUTH EVERY DAY AFTER SUPPER (Patient taking differently: Take 0.4 mg by mouth daily. ), Disp: 30 capsule, Rfl: 11   torsemide (DEMADEX) 20 MG tablet, TAKE 2 TABLETS BY MOUTH TWICE A DAY (Patient taking differently: Take 40 mg by mouth daily. ), Disp: 360 tablet, Rfl: 0   traMADol (ULTRAM) 50 MG tablet, Take 50 mg by mouth every 6 (six) hours as needed for moderate pain. , Disp: , Rfl:    traZODone (DESYREL) 100 MG tablet, Take 100 mg by mouth at bedtime as needed for sleep. , Disp: , Rfl:   Past Medical History: Past Medical History:  Diagnosis Date   Acid reflux    Anxiety    Arthritis    Atrial  fibrillation (HCC)    CHF (congestive heart failure) (HCC)    Chronic orthostatic hypotension    Clotting disorder (HCC)    COPD (chronic obstructive pulmonary disease) (HCC)    Depression    Elevated PSA    Heart attack ([place])    Heart disease    Heart failure (HCC)    Hepatitis    High cholesterol    Hypertension    Sleep apnea    TBI (traumatic brain injury) ([place])    Urinary retention     Tobacco Use: Social History   Tobacco Use  Smoking Status Former Smoker   Packs/day: 1.00   Years: 40.00   Pack years: 40.00   Types: Cigarettes   Quit date: 08/2015   Years since quitting: 4.6  Smokeless Tobacco Never Used  Tobacco Comment   Quit 2017      Labs: Recent Review Scientist, physiological    Labs for ITP Cardiac and Pulmonary Rehab Latest Ref  Rng & Units 11/23/2017   Cholestrol 100 - 199 mg/dL 114   LDLCALC 0 - 99 mg/dL 60   HDL >39 mg/dL 30(L)   Trlycerides 0 - 149 mg/dL 119       Exercise Target Goals: Exercise Program Goal: Individual exercise prescription set using results from initial 6 min walk test and THRR while considering  patients activity barriers and safety.   Exercise Prescription Goal: Initial exercise prescription builds to 30-45 minutes a day of aerobic activity, 2-3 days per week.  Home exercise guidelines will be given to patient during program as part of exercise prescription that the participant will acknowledge.   Education: Aerobic Exercise & Resistance Training: - Gives group verbal and written instruction on the various components of exercise. Focuses on aerobic and resistive training programs and the benefits of this training and how to safely progress through these programs..   Cardiac Rehab from 03/24/2018 in [facility] Cardiac and Pulmonary Rehab  Date 02/10/18  Educator AS  Instruction Review Code 1- Verbalizes Understanding      Education: Exercise & Equipment Safety: - Individual verbal instruction and demonstration of equipment use and safety with use of the equipment.   Cardiac Rehab from 02/15/2020 in [facility] Cardiac and Pulmonary Rehab  Date 11/27/19  Educator [facility]  Instruction Review Code 1- Verbalizes Understanding      Education: Exercise Physiology & General Exercise Guidelines: - Group verbal and written instruction with models to review the exercise physiology of the cardiovascular system and associated critical values. Provides general exercise guidelines with specific guidelines to those with heart or lung disease.    Cardiac Rehab from 03/24/2018 in [facility] Cardiac and Pulmonary Rehab  Date 02/01/18  Educator [facility]  Instruction Review Code 1- Verbalizes Understanding      Education: Flexibility, Balance, Mind/Body Relaxation: Provides group verbal/written instruction on the benefits of  flexibility and balance training, including mind/body exercise modes such as yoga, pilates and tai chi.  Demonstration and skill practice provided.   Cardiac Rehab from 03/24/2018 in [facility] Cardiac and Pulmonary Rehab  Date 12/23/17  Educator AS  Instruction Review Code 1- Verbalizes Understanding      Activity Barriers & Risk Stratification:  Activity Barriers & Cardiac Risk Stratification - 11/27/19 1258      Activity Barriers & Cardiac Risk Stratification   Activity Barriers Shortness of Breath;Back Problems;Joint Problems;Decreased Ventricular Function;Deconditioning;History of Falls;Balance Concerns;Neck/Spine Problems;Muscular Weakness    Cardiac Risk Stratification High           6 Minute  Walk:  6 Minute Walk    Row Name 11/27/19 1257         6 Minute Walk   Phase Initial     Distance 1045 feet     Walk Time 6 minutes     MPH 1.98     RPE 13     Perceived Dyspnea  3     VO2 Peak 7.48     Symptoms Yes (comment)     Comments SOB, neuropathy     Resting HR 75 bpm     Resting BP 146/64     Resting Oxygen Saturation  99 %     Exercise Oxygen Saturation  during 6 min walk 99 %     Max Ex. HR 103 bpm     Max Ex. BP 126/64     2 Minute Post BP 126/64            Oxygen Initial Assessment:  Oxygen Initial Assessment - 11/22/19 1113      Home Oxygen   Home Oxygen Device Home Concentrator    Sleep Oxygen Prescription Continuous    Liters per minute 2    Home Exercise Oxygen Prescription None    Home at Rest Exercise Oxygen Prescription None    Compliance with Home Oxygen Use Yes      Intervention   Short Term Goals To learn and understand importance of monitoring SPO2 with pulse oximeter and demonstrate accurate use of the pulse oximeter.;To learn and understand importance of maintaining oxygen saturations>88%;To learn and demonstrate proper pursed lip breathing techniques or other breathing techniques.    Long  Term Goals Exhibits compliance with exercise, home  and travel O2 prescription;Verbalizes importance of monitoring SPO2 with pulse oximeter and return demonstration;Maintenance of O2 saturations>88%;Exhibits proper breathing techniques, such as pursed lip breathing or other method taught during program session           Oxygen Re-Evaluation:   Oxygen Discharge (Final Oxygen Re-Evaluation):   Initial Exercise Prescription:  Initial Exercise Prescription - 11/27/19 1300      Date of Initial Exercise RX and Referring Provider   Date 11/27/19    Referring Provider Ida Rogue MD      Treadmill   MPH 1.5    Grade 0.5    Minutes 15    METs 2.25      Recumbant Bike   Level 1    RPM 50    Watts 5    Minutes 15    METs 2      NuStep   Level 1    SPM 80    Minutes 15    METs 2      REL-XR   Level 1    Speed 50    Minutes 15    METs 2      Prescription Details   Frequency (times per week) 2    Duration Progress to 30 minutes of continuous aerobic without signs/symptoms of physical distress      Intensity   THRR 40-80% of Max Heartrate 106-137    Ratings of Perceived Exertion 11-13    Perceived Dyspnea 0-4      Progression   Progression Continue to progress workloads to maintain intensity without signs/symptoms of physical distress.      Resistance Training   Training Prescription Yes    Weight 3 lb    Reps 10-15           Perform Capillary Blood Glucose checks as needed.  Exercise Prescription Changes:  Exercise Prescription Changes    Row Name 11/27/19 1300 12/14/19 1100 01/09/20 1500 01/22/20 1300 02/05/20 1700     Response to Exercise   Blood Pressure (Admit) 146/64 98/70 94/64 112/62 126/70   Blood Pressure (Exercise) 126/64 112/60 128/60 116/64 122/62   Blood Pressure (Exit) 126/64 86/62 102/60 98/64 122/64   Heart Rate (Admit) 75 bpm 65 bpm 76 bpm 75 bpm 76 bpm   Heart Rate (Exercise) 103 bpm 102 bpm 127 bpm 94 bpm 110 bpm   Heart Rate (Exit) 75 bpm 75 bpm 75 bpm 78 bpm 76 bpm   Oxygen  Saturation (Admit) 99 % -- -- -- --   Oxygen Saturation (Exercise) 99 % -- -- -- --   Rating of Perceived Exertion (Exercise) _0 Perceived Dyspnea (Exercise) 3 -- -- -- --   Symptoms SOB low BP  -- none none   Comments walk test results third full day of exercise -- -- --   Duration -- Continue with 30 min of aerobic exercise without signs/symptoms of physical distress. Continue with 30 min of aerobic exercise without signs/symptoms of physical distress. Continue with 30 min of aerobic exercise without signs/symptoms of physical distress. Continue with 30 min of aerobic exercise without signs/symptoms of physical distress.   Intensity -- THRR unchanged THRR unchanged THRR unchanged THRR unchanged     Progression   Progression -- Continue to progress workloads to maintain intensity without signs/symptoms of physical distress. Continue to progress workloads to maintain intensity without signs/symptoms of physical distress. Continue to progress workloads to maintain intensity without signs/symptoms of physical distress. Continue to progress workloads to maintain intensity without signs/symptoms of physical distress.   Average METs -- 2.18 2 2.33 2     Resistance Training   Training Prescription -- Yes Yes Yes Yes   Weight -- 3 lb 3 lb 5 lb 5 lb   Reps -- 10-15 10-15 10-15 10-15     Interval Training   Interval Training -- No No No --     Treadmill   MPH -- 1.5 -- 1.7 --   Grade -- 0.5 -- 0.5 --   Minutes -- 15 -- 15 --   METs -- 2.25 -- 2.42 --     Recumbant Bike   Level -- -- 1 2 --   RPM -- -- 50 -- --   Watts -- -- -- 19 --   Minutes -- -- 15 15 --   METs -- -- 2 2.6 --     Arm Ergometer   Level -- -- 1 -- --   Minutes -- -- 15 -- --   METs -- -- 2 -- --     REL-XR   Level -- 1 -- 3 --   Minutes -- 15 -- 15 --   METs -- 2.1 -- 2.3 --     Home Exercise Plan   Plans to continue exercise at -- -- -- [employer] (comment)  Neurosurgeon --   Frequency  -- -- -- Add 2 additional days to program exercise sessions. --   Initial Home Exercises Provided -- -- -- 12/25/19 --   [place] Name 02/20/20 0900             Response to Exercise   Blood Pressure (Admit) 108/66       Blood Pressure (Exercise) 120/70       Blood Pressure (Exit) 124/74       Heart  Rate (Admit) 82 bpm       Heart Rate (Exercise) 100 bpm       Heart Rate (Exit) 78 bpm       Rating of Perceived Exertion (Exercise) 13       Symptoms none       Duration Continue with 30 min of aerobic exercise without signs/symptoms of physical distress.       Intensity THRR unchanged         Progression   Progression Continue to progress workloads to maintain intensity without signs/symptoms of physical distress.       Average METs 2.7         Resistance Training   Training Prescription Yes       Weight 5 lb       Reps 10-15         Interval Training   Interval Training No         Treadmill   MPH 1.8       Grade 0.5       Minutes 15       METs 2.5         Recumbant Bike   Level 7       Minutes 15         Arm Ergometer   Level 1       Minutes 15       METs 2         REL-XR   Level 8       Minutes 15       METs 3.6         Home Exercise Plan   Plans to continue exercise at [employer] (comment)  [employer]       Frequency Add 2 additional days to program exercise sessions.       Initial Home Exercises Provided 12/25/19              Exercise Comments:   Exercise Goals and Review:  Exercise Goals    Row Name 11/27/19 1310             Exercise Goals   Increase Physical Activity Yes       Intervention Provide advice, education, support and counseling about physical activity/exercise needs.;Develop an individualized exercise prescription for aerobic and resistive training based on initial evaluation findings, risk stratification, comorbidities and participant's personal goals.       Expected Outcomes Short Term: Attend rehab on a regular basis to  increase amount of physical activity.;Long Term: Add in home exercise to make exercise part of routine and to increase amount of physical activity.;Long Term: Exercising regularly at least 3-5 days a week.       Able to understand and use rate of perceived exertion (RPE) scale Yes       Intervention Provide education and explanation on how to use RPE scale       Expected Outcomes Short Term: Able to use RPE daily in rehab to express subjective intensity level;Long Term:  Able to use RPE to guide intensity level when exercising independently       Able to understand and use Dyspnea scale Yes       Intervention Provide education and explanation on how to use Dyspnea scale       Expected Outcomes Short Term: Able to use Dyspnea scale daily in rehab to express subjective sense of shortness of breath during exertion;Long Term: Able to use Dyspnea scale to guide intensity level  when exercising independently       Knowledge and understanding of Target Heart Rate Range (THRR) Yes       Intervention Provide education and explanation of THRR including how the numbers were predicted and where they are located for reference       Expected Outcomes Short Term: Able to state/look up THRR;Long Term: Able to use THRR to govern intensity when exercising independently;Short Term: Able to use daily as guideline for intensity in rehab       Able to check pulse independently Yes       Intervention Provide education and demonstration on how to check pulse in carotid and radial arteries.;Review the importance of being able to check your own pulse for safety during independent exercise       Expected Outcomes Short Term: Able to explain why pulse checking is important during independent exercise;Long Term: Able to check pulse independently and accurately       Understanding of Exercise Prescription Yes       Intervention Provide education, explanation, and written materials on patient's individual exercise prescription        Expected Outcomes Short Term: Able to explain program exercise prescription;Long Term: Able to explain home exercise prescription to exercise independently              Exercise Goals Re-Evaluation :  Exercise Goals Re-Evaluation    Row Name 12/14/19 1142 12/25/19 1428 01/02/20 1135 01/09/20 1507 01/22/20 1306     Exercise Goal Re-Evaluation   Exercise Goals Review Increase Physical Activity;Increase Strength and Stamina;Understanding of Exercise Prescription -- Increase Physical Activity;Increase Strength and Stamina;Understanding of Exercise Prescription Increase Physical Activity;Increase Strength and Stamina;Understanding of Exercise Prescription Increase Physical Activity;Increase Strength and Stamina;Understanding of Exercise Prescription   Comments Akeen is off to a good start in rehab.  He has completed his first three full days of exercise.  We will continue monitor his progress. Out since last review [place] has returned and doing well in rehab.  He has some weights are home that he has been doing. He is considering going back to [employer] again.  We talked about starting light back on the weight machines.  He really wants his stamina back faster.  Reviewed home exercise with pt today.  Pt plans to join [employer] for exercise.  Reviewed THR, pulse, RPE, sign and symptoms, pulse oximetery and when to call 911 or MD.  Also discussed weather considerations and indoor options.  Pt voiced understanding. Baley is tolerating exercise well.  Staff will encourage him to increase levels on machines and weights. Windell has been doing well in rehab.  He is now up to level 3 on the XR and up to 1.7 mph on the treadmill.   We will continue to monitor his progress.   Expected Outcomes Short: Continue to attend rehab regularly  Long: Continue to improve stamina. -- Short: Start getting back to gym on off days Long; Continue to improve stamina. Short: increase levels on machines Long: improve MET  level Short: Continue to increase workloads Long: Continue to improve strength and stamina.   [place] Name 01/30/20 1120 02/20/20 0923           Exercise Goal Re-Evaluation   Exercise Goals Review Increase Physical Activity;Increase Strength and Stamina;Understanding of Exercise Prescription Increase Physical Activity;Increase Strength and Stamina;Understanding of Exercise Prescription      Comments Karl has been doing well in rehab.  He is still going to gym on off days to  do 30 min on treadmill or ellipitcal.  He feels that his strength and stamina are starting to improve.  Shadman was able to walk down here on his own today!! Delton will be out for the next couple of weeks with a family emergency. He was encouraged to continue to walk while away from rehab.  He has worked his way up to 3.6 METs on the XR.  We will continue to monitor his progress.      Expected Outcomes Short: Continue to exercise on off days  Long: Continue to improve stamina. Short: Walk while out of rehab.  Long: Return to rehab with good attendance.             Discharge Exercise Prescription (Final Exercise Prescription Changes):  Exercise Prescription Changes - 02/20/20 0900      Response to Exercise   Blood Pressure (Admit) 108/66    Blood Pressure (Exercise) 120/70    Blood Pressure (Exit) 124/74    Heart Rate (Admit) 82 bpm    Heart Rate (Exercise) 100 bpm    Heart Rate (Exit) 78 bpm    Rating of Perceived Exertion (Exercise) 13    Symptoms none    Duration Continue with 30 min of aerobic exercise without signs/symptoms of physical distress.    Intensity THRR unchanged      Progression   Progression Continue to progress workloads to maintain intensity without signs/symptoms of physical distress.    Average METs 2.7      Resistance Training   Training Prescription Yes    Weight 5 lb    Reps 10-15      Interval Training   Interval Training No      Treadmill   MPH 1.8    Grade 0.5    Minutes 15     METs 2.5      Recumbant Bike   Level 7    Minutes 15      Arm Ergometer   Level 1    Minutes 15    METs 2      REL-XR   Level 8    Minutes 15    METs 3.6      Home Exercise Plan   Plans to continue exercise at [employer] (comment)   [employer]   Frequency Add 2 additional days to program exercise sessions.    Initial Home Exercises Provided 12/25/19           Nutrition:  Target Goals: Understanding of nutrition guidelines, daily intake of sodium <1529m, cholesterol <2061m calories 30% from fat and 7% or less from saturated fats, daily to have 5 or more servings of fruits and vegetables.  Education: Controlling Sodium/Reading Food Labels -Group verbal and written material supporting the discussion of sodium use in heart healthy nutrition. Review and explanation with models, verbal and written materials for utilization of the food label.   [facility]  Date 03/24/18  Educator LB  Instruction Review Code 1- Verbalizes Understanding      Education: General Nutrition Guidelines/Fats and Fiber: -Group instruction provided by verbal, written material, models and posters to present the general guidelines for heart healthy nutrition. Gives an explanation and review of dietary fats and fiber.   [facility]  Date 03/15/18  Educator LB  Instruction Review Code 1- Verbalizes Understanding      Biometrics:  Pre Biometrics - 11/27/19 1310  Pre Biometrics   Height 5' 7.2" (1.707 m)    Weight 228 lb 14.4 oz (103.8 kg)    BMI (Calculated) 35.63    Single Leg Stand 9.91 seconds            Nutrition Therapy Plan and Nutrition Goals:  Nutrition Therapy & Goals - 12/26/19 1128      Nutrition Therapy   Diet Heart healthy    Drug/Food Interactions Statins/Certain Fruits    Protein (specify units) 70-75g    Fiber 30 grams    Whole Grain Foods 3  servings    Saturated Fats 12 max. grams    Fruits and Vegetables 5 servings/day    Sodium 1.5 grams      Personal Nutrition Goals   Nutrition Goal ST: add in protein (hummus + and veggies) LT: weight to 200 lbs, improve lung capactiy, and strengthen heart, increase muscle mass    Comments B: bacon egg and cheese biscuit or eggs at home and have baked bacon (omelette) with english muffin or toast - coffee and no sugar, orange juice (takes pills) L: 2 tacos - eats lots of [place] food, ice water with lemon (sometimes beer with chips and salsa), or two chicken enchiladas ([place] - restaurant "[employer] [place]" or "[employer]"  D: Varies: chicken cattitorie (baked) - with fresh homemade sauce, and fresh vegetables, with some pasta (white). Still having dysphagia - his family got surgery. 3-4 diet pepsis and then water. His dysphagia depends on how mindful he is. Reviewed heart healthy eating. Pt reports having lots of vegetables and beans during dinner. Pt would like to increase protein.      Intervention Plan   Intervention Prescribe, educate and counsel regarding individualized specific dietary modifications aiming towards targeted core components such as weight, hypertension, lipid management, diabetes, heart failure and other comorbidities.    Expected Outcomes Short Term Goal: Understand basic principles of dietary content, such as calories, fat, sodium, cholesterol and nutrients.;Short Term Goal: A plan has been developed with personal nutrition goals set during dietitian appointment.;Long Term Goal: Adherence to prescribed nutrition plan.           Nutrition Assessments:  Nutrition Assessments - 11/27/19 1322      MEDFICTS Scores   Pre Score 61           MEDIFICTS Score Key:          ?70 Need to make dietary changes          40-70 Heart Healthy Diet         ? 40 Therapeutic Level Cholesterol Diet  Nutrition Goals Re-Evaluation:  Nutrition Goals Re-Evaluation    [place] Name 01/02/20  1142 01/30/20 1121 03/12/20 1009         Goals   Nutrition Goal ST: add in protein (hummus + and veggies) LT: weight to 200 lbs, improve lung capactiy, and strengthen heart, increase muscle mass Heart healthier eating Continue changes     Comment Continue to work on new changes set forth with Melissa last week. Hallie is meeting with a nutritionist on his own this afternoon Continue with current changes     Expected Outcome Short: Add in protein Long; Continue to work on weight loss. Short: Add in protein Long; Continue to work on weight loss. Continue changes            Nutrition Goals Discharge (Final Nutrition Goals Re-Evaluation):  Nutrition Goals Re-Evaluation - 03/12/20 1009      Goals   Nutrition  Goal Continue changes    Comment Continue with current changes    Expected Outcome Continue changes           Psychosocial: Target Goals: Acknowledge presence or absence of significant depression and/or stress, maximize coping skills, provide positive support system. Participant is able to verbalize types and ability to use techniques and skills needed for reducing stress and depression.   Education: Depression - Provides group verbal and written instruction on the correlation between heart/lung disease and depressed mood, treatment options, and the stigmas associated with seeking treatment.   Cardiac Rehab from 03/24/2018 in [facility] Cardiac and Pulmonary Rehab  Date 11/17/17  Educator [facility]  Instruction Review Code 1- [employer] Understanding      Education: Sleep Hygiene -Provides group verbal and written instruction about how sleep can affect your health.  Define sleep hygiene, discuss sleep cycles and impact of sleep habits. Review good sleep hygiene tips.    Cardiac Rehab from 03/24/2018 in [facility] Cardiac and Pulmonary Rehab  Date 02/03/18  Educator [facility]  Instruction Review Code 1- Verbalizes Understanding       Education: Stress and Anxiety: - Provides group verbal and  written instruction about the health risks of elevated stress and causes of high stress.  Discuss the correlation between heart/lung disease and anxiety and treatment options. Review healthy ways to manage with stress and anxiety.   Cardiac Rehab from 10/27/2016 in [facility] Cardiac and Pulmonary Rehab  Date 08/13/16  Educator TS  Instruction Review Code (retired) 2- meets goals/outcomes       Initial Review & Psychosocial Screening:  Initial Psych Review & Screening - 11/22/19 1114      Initial Review   Current issues with History of Depression;Current Stress Concerns;Current Anxiety/Panic    Source of Stress Concerns Family    Comments Stress with his daughter.  Has chronic anxiety is controlled with meds      Family Dynamics   Good Support System? No    Concerns No support system    Comments Stress currently with his daughter. Ex Wife does not drive.    NO current support system      Barriers   Psychosocial barriers to participate in program There are no identifiable barriers or psychosocial needs.      Screening Interventions   Interventions Encouraged to exercise    Expected Outcomes Short Term goal: Utilizing psychosocial counselor, staff and physician to assist with identification of specific Stressors or current issues interfering with healing process. Setting desired goal for each stressor or current issue identified.;Long Term Goal: Stressors or current issues are controlled or eliminated.;Short Term goal: Identification and review with participant of any Quality of Life or Depression concerns found by scoring the questionnaire.;Long Term goal: The participant improves quality of Life and PHQ9 Scores as seen by post scores and/or verbalization of changes           Quality of Life Scores:   Quality of Life - 11/27/19 1311      Quality of Life   Select Quality of Life      Quality of Life Scores   Health/Function Pre 15.53 %    Socioeconomic Pre 19.5 %    Psych/Spiritual  Pre 24 %    Family Pre 24.38 %    GLOBAL Pre 19.23 %          Scores of 19 and below usually indicate a poorer quality of life in these areas.  A difference of  2-3 points is a  clinically meaningful difference.  A difference of 2-3 points in the total score of the Quality of Life Index has been associated with significant improvement in overall quality of life, self-image, physical symptoms, and general health in studies assessing change in quality of life.  PHQ-9: Recent Review Flowsheet Data    Depression screen [facility] 2/9 12/26/2019 11/27/2019 03/15/2018 12/27/2017 12/02/2017   Decreased Interest _0 0 2   Down, Depressed, Hopeless _1 0 2   PHQ - 2 Score _2 0 4   Altered sleeping 0 1 1 - 1   Tired, decreased energy _3 - 1   Change in appetite 0 1 1 - 1   Feeling bad or failure about yourself  0 0 1 - 1   Trouble concentrating _4 - 1   Moving slowly or fidgety/restless 0 0 3 - 1   Suicidal thoughts 0 0 0 - 0   PHQ-9 Score _5 - 10   Difficult doing work/chores Not difficult at all Somewhat difficult Somewhat difficult - Somewhat difficult     Interpretation of Total Score  Total Score Depression Severity:  1-4 = Minimal depression, 5-9 = Mild depression, 10-14 = Moderate depression, 15-19 = Moderately severe depression, 20-27 = Severe depression   Psychosocial Evaluation and Intervention:  Psychosocial Evaluation - 11/22/19 1125      Psychosocial Evaluation & Interventions   Comments Bienvenido has a new referral to the program after attending in 2019. He is ready to return and has no identified barriers to entry. He lives alone in an apartment complex with his dog. He has lost 41 pounds since his last time here and wants to continue his weight loss at 1 - 2 pounds a week until his goalof 200 pounds.He continues to have stressrelated to his relationship with his daughter. HIs son lives in [place]. He wants to get stronger, breath better and continue his weight loss pattern.   He should do well with the program.    Expected Outcomes Short:  Isair will improve his breathing with exercise.   Long:  Markevion will cope more positively with skills learned and increase his support system for more positive interactions.      Continue Psychosocial Services  Follow up required by staff           Psychosocial Re-Evaluation:  Psychosocial Re-Evaluation    [place] Name 01/02/20 1137 01/30/20 1123           Psychosocial Re-Evaluation   Current issues with Current Stress Concerns Current Stress Concerns      Comments Bertis is doing well in rehab.  He thinks his gout has flared up again from extra fluid.  He continues to struggle with his relationship with his daughter but is able to talk to his son.  He is sleeping pretty good.  He is up late at night with mental disturbances from worring about his dog.  His dog got really sick last week and determined to be a diabetic.  He took him to the emergency vet for care.  Things are starting to calm down now and starting to improve.  He is 69 years old. Marlowe is doing well.    He is now giving his dog shots daily which neither of them like.  The dog is also having some incontenence issues now and needs to clean the floor.  He is doing what he can to make his dog comfortable.  He is sleeping well.  He just lost his friend yesterday and they are still not sure what really caused his death.      Expected Outcomes Short: Continue to care for dog and find outlets for stress Long; Continue to work on Radiographer, therapeutic. Short; Continue to care for dog.  Long; Continue to use exercise for mental boost.      Interventions Stress management education;Encouraged to attend Cardiac Rehabilitation for the exercise Stress management education;Encouraged to attend Cardiac Rehabilitation for the exercise      Continue Psychosocial Services  Follow up required by staff Follow up required by staff             Psychosocial Discharge (Final Psychosocial  Re-Evaluation):  Psychosocial Re-Evaluation - 01/30/20 1123      Psychosocial Re-Evaluation   Current issues with Current Stress Concerns    Comments Dametrius is doing well.    He is now giving his dog shots daily which neither of them like.  The dog is also having some incontenence issues now and needs to clean the floor.  He is doing what he can to make his dog comfortable.   He is sleeping well.  He just lost his friend yesterday and they are still not sure what really caused his death.    Expected Outcomes Short; Continue to care for dog.  Long; Continue to use exercise for mental boost.    Interventions Stress management education;Encouraged to attend Cardiac Rehabilitation for the exercise    Continue Psychosocial Services  Follow up required by staff           Vocational Rehabilitation: Provide vocational rehab assistance to qualifying candidates.   Vocational Rehab Evaluation & Intervention:  Vocational Rehab - 11/22/19 1121      Initial Vocational Rehab Evaluation & Intervention   Assessment shows need for Vocational Rehabilitation No           Education: Education Goals: Education classes will be provided on a variety of topics geared toward better understanding of heart health and risk factor modification. Participant will state understanding/return demonstration of topics presented as noted by education test scores.  Learning Barriers/Preferences:   General Cardiac Education Topics:  AED/CPR: - Group verbal and written instruction with the use of models to demonstrate the basic use of the AED with the basic ABC's of resuscitation.   Cardiac Rehab from 03/24/2018 in [facility] Cardiac and Pulmonary Rehab  Date 03/22/18  Educator [place]  Instruction Review Code 1- Verbalizes Understanding      Anatomy & Physiology of the Heart: - Group verbal and written instruction and models provide basic cardiac anatomy and physiology, with the coronary electrical and arterial systems.  Review of Valvular disease and Heart Failure   Cardiac Rehab from 03/24/2018 in [facility] Cardiac and Pulmonary Rehab  Date 02/24/18  Educator CE  Instruction Review Code 1- Verbalizes Understanding      Cardiac Procedures: - Group verbal and written instruction to review commonly prescribed medications for heart disease. Reviews the medication, class of the drug, and side effects. Includes the steps to properly store meds and maintain the prescription regimen. (beta blockers and nitrates)   Cardiac Rehab from 03/24/2018 in [facility] Cardiac and Pulmonary Rehab  Date 03/17/18  Educator CE  Instruction Review Code 1- Verbalizes Understanding      Cardiac Medications I: - Group verbal and written instruction to review commonly prescribed medications for heart disease. Reviews the medication, class of the drug, and side effects. Includes the  steps to properly store meds and maintain the prescription regimen.   Cardiac Rehab from 03/24/2018 in [facility] Cardiac and Pulmonary Rehab  Date 03/01/18  Educator SB  Instruction Review Code 1- Verbalizes Understanding      Cardiac Medications II: -Group verbal and written instruction to review commonly prescribed medications for heart disease. Reviews the medication, class of the drug, and side effects. (all other drug classes)   Cardiac Rehab from 02/15/2020 in [facility] Cardiac and Pulmonary Rehab  Date 02/15/20  Educator SB  Instruction Review Code 1- Verbalizes Understanding       Go Sex-Intimacy & Heart Disease, Get SMART - Goal Setting: - Group verbal and written instruction through game format to discuss heart disease and the return to sexual intimacy. Provides group verbal and written material to discuss and apply goal setting through the application of the S.M.A.R.T. Method.   Cardiac Rehab from 03/24/2018 in [facility] Cardiac and Pulmonary Rehab  Date 03/17/18  Educator CE  Instruction Review Code 1- Verbalizes Understanding      Other Matters of the  Heart: - Provides group verbal, written materials and models to describe Stable Angina and Peripheral Artery. Includes description of the disease process and treatment options available to the cardiac patient.   Cardiac Rehab from 03/24/2018 in [facility] Cardiac and Pulmonary Rehab  Date 02/24/18  Educator CE  Instruction Review Code 1- Verbalizes Understanding      Infection Prevention: - Provides verbal and written material to individual with discussion of infection control including proper hand washing and proper equipment cleaning during exercise session.   Cardiac Rehab from 02/15/2020 in [facility] Cardiac and Pulmonary Rehab  Date 11/27/19  Educator [facility]  Instruction Review Code 1- Verbalizes Understanding      Falls Prevention: - Provides verbal and written material to individual with discussion of falls prevention and safety.   Cardiac Rehab from 02/15/2020 in [facility] Cardiac and Pulmonary Rehab  Date 11/27/19  Educator [facility]  Instruction Review Code 1- Verbalizes Understanding      Other: -Provides group and verbal instruction on various topics (see comments)   Knowledge Questionnaire Score:  Knowledge Questionnaire Score - 11/27/19 1322      Knowledge Questionnaire Score   Pre Score 24/26 Education Focus: A&P, nutrition           Core Components/Risk Factors/Patient Goals at Admission:  Personal Goals and Risk Factors at Admission - 11/27/19 1323      Core Components/Risk Factors/Patient Goals on Admission    Weight Management Yes;Weight Loss;Obesity    Intervention Weight Management: Develop a combined nutrition and exercise program designed to reach desired caloric intake, while maintaining appropriate intake of nutrient and fiber, sodium and fats, and appropriate energy expenditure required for the weight goal.;Weight Management/Obesity: Establish reasonable short term and long term weight goals.;Weight Management: Provide education and appropriate resources to help participant  work on and attain dietary goals.;Obesity: Provide education and appropriate resources to help participant work on and attain dietary goals.    Admit Weight 228 lb 14.4 oz (103.8 kg)    Goal Weight: Short Term 223 lb (101.2 kg)    Goal Weight: Long Term 200 lb (90.7 kg)    Expected Outcomes Short Term: Continue to assess and modify interventions until short term weight is achieved;Long Term: Adherence to nutrition and physical activity/exercise program aimed toward attainment of established weight goal;Weight Loss: Understanding of general recommendations for a balanced deficit meal plan, which promotes 1-2 lb weight loss per week and includes a  negative energy balance of [phone] kcal/d;Understanding recommendations for meals to include 15-35% energy as protein, 25-35% energy from fat, 35-60% energy from carbohydrates, less than 227m of dietary cholesterol, 20-35 gm of total fiber daily;Understanding of distribution of calorie intake throughout the day with the consumption of 4-5 meals/snacks    Improve shortness of breath with ADL's Yes    Intervention Provide education, individualized exercise plan and daily activity instruction to help decrease symptoms of SOB with activities of daily living.    Expected Outcomes Short Term: Improve cardiorespiratory fitness to achieve a reduction of symptoms when performing ADLs;Long Term: Be able to perform more ADLs without symptoms or delay the onset of symptoms    Heart Failure Yes    Intervention Provide a combined exercise and nutrition program that is supplemented with education, support and counseling about heart failure. Directed toward relieving symptoms such as shortness of breath, decreased exercise tolerance, and extremity edema.    Expected Outcomes Improve functional capacity of life;Short term: Attendance in program 2-3 days a week with increased exercise capacity. Reported lower sodium intake. Reported increased fruit and vegetable intake. Reports  medication compliance.;Short term: Daily weights obtained and reported for increase. Utilizing diuretic protocols set by physician.;Long term: Adoption of self-care skills and reduction of barriers for early signs and symptoms recognition and intervention leading to self-care maintenance.    Hypertension Yes    Intervention Provide education on lifestyle modifcations including regular physical activity/exercise, weight management, moderate sodium restriction and increased consumption of fresh fruit, vegetables, and low fat dairy, alcohol moderation, and smoking cessation.;Monitor prescription use compliance.    Expected Outcomes Short Term: Continued assessment and intervention until BP is < 140/973mHG in hypertensive participants. < 130/8017mG in hypertensive participants with diabetes, heart failure or chronic kidney disease.;Long Term: Maintenance of blood pressure at goal levels.    Lipids Yes    Intervention Provide education and support for participant on nutrition & aerobic/resistive exercise along with prescribed medications to achieve LDL <83m57mDL >40mg51m Expected Outcomes Short Term: Participant states understanding of desired cholesterol values and is compliant with medications prescribed. Participant is following exercise prescription and nutrition guidelines.;Long Term: Cholesterol controlled with medications as prescribed, with individualized exercise RX and with personalized nutrition plan. Value goals: LDL < 83mg,38m > 40 mg.           Education:Diabetes - Individual verbal and written instruction to review signs/symptoms of diabetes, desired ranges of glucose level fasting, after meals and with exercise. Acknowledge that pre and post exercise glucose checks will be done for 3 sessions at entry of program.   Education: Know Your Numbers and Risk Factors: -Group verbal and written instruction about important numbers in your health.  Discussion of what are risk factors and how  they play a role in the disease process.  Review of Cholesterol, Blood Pressure, Diabetes, and BMI and the role they play in your overall health.   [facility]  Date 02/15/20  Educator SB  Instruction Review Code 1- Verbalizes Understanding      Core Components/Risk Factors/Patient Goals Review:   Goals and Risk Factor Review    Row Name 01/02/20 1142 01/30/20 1121           Core Components/Risk Factors/Patient Goals Review   Personal Goals Review Weight Management/Obesity;Lipids;Hypertension;Heart Failure;Improve shortness of breath with ADL's Weight Management/Obesity;Lipids;Hypertension;Heart Failure;Improve shortness of breath with ADL's      Review CarmenShafin  doing well back in rehab.  His weight is up to 231 lb this week.  He does weigh routinely at home and tracks it closely.  He also monitors his salt and fluid intake. He checks his pressure every other day at home and tracks it.  He also keeps an eye on his oxygen saturation and usually is in the upper 90s.  His breathing is doing okay and he is using his inhaler.  He still gets short of breath doing activity and really noticed it while trying to carry his dog in and out of the apartment. He still has some swelling in his ankles for his heart failure. [place] continues to work on weight loss.  His pressures have been doing well and he denies any heart failure symptoms.  His biggest problem continues to be his breathing.  He continues to try to improve.  He feels better overall, but still wants to do more.      Expected Outcomes Short: Continue to work on weight loss Long: Continue to manage heart failure. Short: Continue to work on on weight loss Long; Continue to monitor risk factors.             Core Components/Risk Factors/Patient Goals at Discharge (Final Review):   Goals and Risk Factor Review - 01/30/20 1121      Core Components/Risk Factors/Patient Goals Review   Personal  Goals Review Weight Management/Obesity;Lipids;Hypertension;Heart Failure;Improve shortness of breath with ADL's    Review Geraldo continues to work on weight loss.  His pressures have been doing well and he denies any heart failure symptoms.  His biggest problem continues to be his breathing.  He continues to try to improve.  He feels better overall, but still wants to do more.    Expected Outcomes Short: Continue to work on on weight loss Long; Continue to monitor risk factors.           ITP Comments:  ITP Comments    Row Name 11/22/19 1130 11/27/19 1256 12/20/19 0610 12/25/19 1428 01/17/20 0925   ITP Comments Virtual Orientation call completed with Rodrigus today. He has appt on 5/24 for the EP Eval and gym orientation. Diagnosis documentation can be found in [facility] 11/13/19 office visit Completed 6MWT and gym orientation.  Initial ITP created and sent for review to Dr. Emily Filbert, Medical Director. 30 Day review completed. Medical Director ITP review done, changes made as directed, and signed approval by Medical Director. Called to check on patient.  He was out with various appointments.  He plans to be here tomorrow. 30 Day review completed. Medical Director ITP review done, changes made as directed, and signed approval by Medical Director.   [place] Name 02/14/20 [phone] 02/20/20 0922 03/07/20 1116 03/13/20 1525     ITP Comments 30 Day review completed. Medical Director ITP review done, changes made as directed, and signed approval by Medical Director. Sirus called yesterday to let us know that he may be out for the next couple of weeks with a family emergency. Called to check on patient.  He is still down in [place] and plans to return around 9/15. 30 day review completed. ITP sent to Dr. Emily Filbert, Medical Director of Cardiac and Pulmonary Rehab. Continue with ITP unless changes are made by physician.           Comments: 30 day review

## 2020-03-15 ENCOUNTER — Ambulatory Visit: Payer: Medicare Other | Admitting: Internal Medicine

## 2020-03-21 ENCOUNTER — Encounter: Payer: Medicare Other | Attending: Cardiovascular Disease | Admitting: *Deleted

## 2020-03-21 ENCOUNTER — Other Ambulatory Visit: Payer: Self-pay

## 2020-03-21 DIAGNOSIS — Z7902 Long term (current) use of antithrombotics/antiplatelets: Secondary | ICD-10-CM | POA: Diagnosis not present

## 2020-03-21 DIAGNOSIS — F329 Major depressive disorder, single episode, unspecified: Secondary | ICD-10-CM | POA: Insufficient documentation

## 2020-03-21 DIAGNOSIS — J449 Chronic obstructive pulmonary disease, unspecified: Secondary | ICD-10-CM | POA: Insufficient documentation

## 2020-03-21 DIAGNOSIS — I110 Hypertensive heart disease with heart failure: Secondary | ICD-10-CM | POA: Diagnosis not present

## 2020-03-21 DIAGNOSIS — Z87820 Personal history of traumatic brain injury: Secondary | ICD-10-CM | POA: Insufficient documentation

## 2020-03-21 DIAGNOSIS — F419 Anxiety disorder, unspecified: Secondary | ICD-10-CM | POA: Insufficient documentation

## 2020-03-21 DIAGNOSIS — Z87891 Personal history of nicotine dependence: Secondary | ICD-10-CM | POA: Insufficient documentation

## 2020-03-21 DIAGNOSIS — I480 Paroxysmal atrial fibrillation: Secondary | ICD-10-CM | POA: Diagnosis not present

## 2020-03-21 DIAGNOSIS — I208 Other forms of angina pectoris: Secondary | ICD-10-CM | POA: Insufficient documentation

## 2020-03-21 DIAGNOSIS — Z7901 Long term (current) use of anticoagulants: Secondary | ICD-10-CM | POA: Diagnosis not present

## 2020-03-21 DIAGNOSIS — Z79899 Other long term (current) drug therapy: Secondary | ICD-10-CM | POA: Diagnosis not present

## 2020-03-21 DIAGNOSIS — I509 Heart failure, unspecified: Secondary | ICD-10-CM | POA: Diagnosis not present

## 2020-03-21 NOTE — Progress Notes (Signed)
Daily Session Note  Patient Details  Name: Kevin Pearson MRN: [id number] Date of Birth: Aug 15, 1950 Referring Provider:     Cardiac Rehab from 11/27/2019 in [facility] Cardiac and Pulmonary Rehab  Referring Provider Ida Rogue MD      Encounter Date: 03/21/2020  Check In:  Session Check In - 03/21/20 1034      Check-In   Supervising physician immediately available to respond to emergencies See telemetry face sheet for immediately available ER MD    Location [facility]-Cardiac & Pulmonary Rehab    Staff Present Renita Papa, RN Margurite Auerbach, MS Exercise Physiologist;Jessica Luan Pulling, MA, RCEP, CCRP, CCET;Melissa Hutchinson RDN, Rowe Pavy, [place], ACSM CEP, Exercise Physiologist    Virtual Visit No    Medication changes reported     No    Fall or balance concerns reported    No    Warm-up and Cool-down Performed on first and last piece of equipment    Resistance Training Performed Yes    VAD Patient? No    PAD/SET Patient? No      Pain Assessment   Currently in Pain? No/denies              Social History   Tobacco Use  Smoking Status Former Smoker  . Packs/day: 1.00  . Years: 40.00  . Pack years: 40.00  . Types: Cigarettes  . Quit date: 08/2015  . Years since quitting: 4.6  Smokeless Tobacco Never Used  Tobacco Comment   Quit 2017      Goals Met:  Independence with exercise equipment Exercise tolerated well No report of cardiac concerns or symptoms Strength training completed today  Goals Unmet:  Not Applicable  Comments: Pt able to follow exercise prescription today without complaint.  Will continue to monitor for progression.    Dr. Emily Filbert is Medical Director for [place] and [name].

## 2020-03-26 ENCOUNTER — Other Ambulatory Visit: Payer: Self-pay

## 2020-03-26 ENCOUNTER — Encounter: Payer: Medicare Other | Admitting: *Deleted

## 2020-03-26 DIAGNOSIS — I208 Other forms of angina pectoris: Secondary | ICD-10-CM

## 2020-03-26 NOTE — Progress Notes (Signed)
03/27/2020 10:05 AM   Kevin Pearson 04-16-1951 [id number]  Referring provider: Sharilyn Sites, MD No address on file Chief Complaint  Patient presents with  . Follow-up    HPI: Kevin Pearson is a 69 y.o. male who presents for a 8 month follow up of bladder neck contracture and history of UTI.   Please see previous notes for details.  He is been taken to the operating room for dilation of bladder neck contracture in the past.  He is now managed with self cath to keep his bladder neck open.  During the last visit, he did have frequency related to his diuretics but otherwise was able to empty.  He had been self cathing once daily with ease.  He had started using alcohol at the tip of his penis to prevent urinary tract infections.  He wondered if this was a good idea. He denied any dysuria or gross hematuria. UA today was completely negative.   PVR 60 mL today.  He reports trouble with self cath the other day.  He is not been cathing on a regular basis, every few weeks and has having increasing trouble doing so.   When he cath several days ago, he was able to get urine out through the cath but did not feel like it advance completely.  He has adequate bladder emptying with increased pressure.   Denies any infection sine 07/2019.   On occasion he may have an accident especially with long car rides. He notes on his last road trip to and from [place] he did not have any accidents. He did however have to stop every hour to urinate.    PMH: Past Medical History:  Diagnosis Date  . Acid reflux   . Anxiety   . Arthritis   . Atrial fibrillation (HCC)   . CHF (congestive heart failure) (HCC)   . Chronic orthostatic hypotension   . Clotting disorder (HCC)   . COPD (chronic obstructive pulmonary disease) (HCC)   . Depression   . Elevated PSA   . Heart attack (HCC)   . Heart disease   . Heart failure (HCC)   . Hepatitis   . High cholesterol   . Hypertension   . Sleep apnea   . TBI  (traumatic brain injury) (HCC)   . Urinary retention     Surgical History: Past Surgical History:  Procedure Laterality Date  . BLADDER SURGERY    . cardiac stents    . CYSTOSCOPY WITH URETHRAL DILATATION N/A 06/25/2016   Procedure: CYSTOSCOPY WITH URETHRAL DILATATION;  Surgeon: Vanna Scotland, MD;  Location: [facility] ORS;  Service: Urology;  Laterality: N/A;  . EP IMPLANTABLE DEVICE     [employer] Jude BiV-ICD  . ESOPHAGOGASTRODUODENOSCOPY (EGD) WITH PROPOFOL N/A 04/06/2019   Procedure: ESOPHAGOGASTRODUODENOSCOPY (EGD) WITH PROPOFOL;  Surgeon: Wyline Mood, MD;  Location: [facility] ENDOSCOPY;  Service: Gastroenterology;  Laterality: N/A;  . GREEN LIGHT LASER TURP (TRANSURETHRAL RESECTION OF PROSTATE  2016   done in [place]   . LEFT HEART CATH AND CORONARY ANGIOGRAPHY Left 10/27/2017   Procedure: LEFT HEART CATH AND CORONARY ANGIOGRAPHY;  Surgeon: Antonieta Iba, MD;  Location: [facility] INVASIVE CV LAB;  Service: Cardiovascular;  Laterality: Left;  . RIGHT/LEFT HEART CATH AND CORONARY ANGIOGRAPHY N/A 11/16/2019   Procedure: RIGHT/LEFT HEART CATH AND CORONARY ANGIOGRAPHY;  Surgeon: Antonieta Iba, MD;  Location: [facility] INVASIVE CV LAB;  Service: Cardiovascular;  Laterality: N/A;    Home Medications:  Allergies as of 03/27/2020  Reactions   Mangifera Indica Anaphylaxis   Other Anaphylaxis, Itching, Other (See Comments)   Mango skin   Keflex [cephalexin] Nausea Only   Codeine Nausea And Vomiting      Medication List       Accurate as of March 27, 2020 10:05 AM. If you have any questions, ask your nurse or doctor.        acetaminophen 325 MG tablet Commonly known as: TYLENOL Take 650 mg by mouth every 6 (six) hours as needed for mild pain or headache.   albuterol 108 (90 Base) MCG/ACT inhaler Commonly known as: VENTOLIN HFA Inhale 2 puffs into the lungs every 6 (six) hours as needed for wheezing or shortness of breath.   busPIRone 10 MG tablet Commonly known as: BUSPAR Take 10 mg by mouth  3 (three) times daily.   clopidogrel 75 MG tablet Commonly known as: PLAVIX Take 75 mg by mouth daily.   divalproex 250 MG DR tablet Commonly known as: DEPAKOTE Take 750 mg by mouth 2 (two) times daily.   gabapentin 300 MG capsule Commonly known as: NEURONTIN Take 600 mg by mouth 2 (two) times daily.   Magnesium Oxide 400 MG Caps Take 1 capsule (400 mg total) by mouth daily.   melatonin 5 MG Tabs Take 5 mg by mouth at bedtime.   metoprolol succinate 50 MG 24 hr tablet Commonly known as: TOPROL-XL TAKE 1 TABLET BY MOUTH EVERY DAY WITH OR IMMEDIATELY AFTER MEAL What changed:   how much to take  how to take this  when to take this  additional instructions   multivitamin tablet Take 1 tablet by mouth daily.   nitroGLYCERIN 0.4 MG SL tablet Commonly known as: NITROSTAT Place 1 tablet (0.4 mg total) under the tongue every 5 (five) minutes as needed for chest pain.   omeprazole 40 MG capsule Commonly known as: PRILOSEC Take 1 capsule (40 mg total) by mouth 2 (two) times daily.   Refresh Optive PF 0.5-0.9 % Soln Generic drug: Carboxymethylcell-Glycerin PF Place 1 drop into both eyes daily as needed (Dry eye).   rosuvastatin 20 MG tablet Commonly known as: CRESTOR Take 20 mg by mouth at bedtime.   senna 8.6 MG tablet Commonly known as: SENOKOT Take 2 tablets by mouth 2 (two) times daily.   tamsulosin 0.4 MG Caps capsule Commonly known as: FLOMAX TAKE ONE CAPSULE BY MOUTH EVERY DAY AFTER SUPPER What changed: See the new instructions.   torsemide 20 MG tablet Commonly known as: DEMADEX TAKE 2 TABLETS BY MOUTH TWICE A DAY What changed: when to take this   traMADol 50 MG tablet Commonly known as: ULTRAM Take 50 mg by mouth every 6 (six) hours as needed for moderate pain.   traZODone 100 MG tablet Commonly known as: DESYREL Take 100 mg by mouth at bedtime as needed for sleep.   Xarelto 20 MG Tabs tablet Generic drug: rivaroxaban Take 20 mg by mouth daily  with supper.       Allergies:  Allergies  Allergen Reactions  . Mangifera Indica Anaphylaxis  . Other Anaphylaxis, Itching and Other (See Comments)    Mango skin  . Keflex [Cephalexin] Nausea Only  . Codeine Nausea And Vomiting    Family History: Family History  Problem Relation Age of Onset  . Other Father        Cerebral hemorrhage  . Kidney cancer Neg Hx   . Kidney disease Neg Hx   . Prostate cancer Neg Hx  Social History:  reports that he quit smoking about 4 years ago. His smoking use included cigarettes. He has a 40.00 pack-year smoking history. He has never used smokeless tobacco. He reports previous alcohol use. He reports that he does not use drugs.   Physical Exam: There were no vitals taken for this visit.  Constitutional:  Alert and oriented, No acute distress. HEENT: [place] AT, moist mucus membranes.  Trachea midline, no masses. Cardiovascular: No clubbing, cyanosis, or edema. Respiratory: Normal respiratory effort, no increased work of breathing. Skin: No rashes, bruises or suspicious lesions. Neurologic: Grossly intact, no focal deficits, moving all 4 extremities. Psychiatric: Normal mood and affect.  Laboratory Data:  Lab Results  Component Value Date   CREATININE 1.52 (H) 11/13/2019    Pertinent Imaging: Results for orders placed or performed in visit on 03/27/20  BLADDER SCAN AMB NON-IMAGING  Result Value Ref Range   Scan Result 60 ml     Assessment & Plan:    1. Bladder neck contracture Chronic/severe Managed by self cath to keep bladder neck open. Discussed continuing self cath once a week at minimum, reminded the importance of this as he is required repeat dilation and trips to the operating room in the past for bladder neck contracture recurrence Adequate emptying.   2. History of UTI No infection since 07/2019.   F/u annually or sooner as needed  [facility] [address] [place], [place]  [address] [phone]  I, Theador Hawthorne, am acting as a scribe for Dr. Vanna Scotland.  I have reviewed the above documentation for accuracy and completeness, and I agree with the above.   Vanna Scotland, MD

## 2020-03-26 NOTE — Progress Notes (Signed)
Daily Session Note  Patient Details  Name: Kevin Pearson MRN: [id number] Date of Birth: [birth year] Referring Provider:     Cardiac Rehab from 11/27/2019 in [facility] Cardiac and Pulmonary Rehab  Referring Provider Ida Rogue MD      Encounter Date: 03/26/2020  Check In:  Session Check In - 03/26/20 1139      Check-In   Supervising physician immediately available to respond to emergencies See telemetry face sheet for immediately available ER MD    Location [facility]-Cardiac & Pulmonary Rehab    Staff Present Heath Lark, RN, BSN, Jacklynn Bue, MS Exercise Physiologist;Joseph Foy [place], [place], ACSM CEP, Exercise Physiologist    Virtual Visit No    Medication changes reported     No    Fall or balance concerns reported    No    Warm-up and Cool-down Performed on first and last piece of equipment    Resistance Training Performed Yes    VAD Patient? No    PAD/SET Patient? No      Pain Assessment   Currently in Pain? No/denies              Social History   Tobacco Use  Smoking Status Former Smoker  . Packs/day: 1.00  . Years: 40.00  . Pack years: 40.00  . Types: Cigarettes  . Quit date: 08/2015  . Years since quitting: 4.6  Smokeless Tobacco Never Used  Tobacco Comment   Quit 2017      Goals Met:  Independence with exercise equipment Exercise tolerated well No report of cardiac concerns or symptoms  Goals Unmet:  Not Applicable  Comments: Pt able to follow exercise prescription today without complaint.  Will continue to monitor for progression.    Dr. Emily Filbert is Medical Director for [place] and [name].

## 2020-03-27 ENCOUNTER — Other Ambulatory Visit: Payer: Self-pay

## 2020-03-27 ENCOUNTER — Ambulatory Visit (INDEPENDENT_AMBULATORY_CARE_PROVIDER_SITE_OTHER): Payer: Medicare Other | Admitting: Urology

## 2020-03-27 VITALS — BP 147/88 | HR 90

## 2020-03-27 DIAGNOSIS — R339 Retention of urine, unspecified: Secondary | ICD-10-CM

## 2020-03-27 LAB — BLADDER SCAN AMB NON-IMAGING: Scan Result: 60

## 2020-03-28 ENCOUNTER — Encounter: Payer: Medicare Other | Admitting: *Deleted

## 2020-03-28 ENCOUNTER — Other Ambulatory Visit: Payer: Self-pay

## 2020-03-28 DIAGNOSIS — I208 Other forms of angina pectoris: Secondary | ICD-10-CM | POA: Diagnosis not present

## 2020-03-28 DIAGNOSIS — I5022 Chronic systolic (congestive) heart failure: Secondary | ICD-10-CM

## 2020-03-28 NOTE — Progress Notes (Signed)
Daily Session Note  Patient Details  Name: Kevin Pearson MRN: [id number] Date of Birth: 09/24/1950 Referring Provider:     Cardiac Rehab from 11/27/2019 in [facility] Cardiac and Pulmonary Rehab  Referring Provider Ida Rogue MD      Encounter Date: 03/28/2020  Check In:  Session Check In - 03/28/20 1128      Check-In   Supervising physician immediately available to respond to emergencies See telemetry face sheet for immediately available ER MD    Location [facility]-Cardiac & Pulmonary Rehab    Staff Present Hope Budds RDN, LDN;Joseph Hood RCP,RRT,BSRT;Analisia Kingsford Mason, RN Margurite Auerbach, MS Exercise Physiologist;Susanne Bice, RN, BSN, CCRP;Jessica Erin, MA, RCEP, CCRP, CCET    Virtual Visit No    Medication changes reported     No    Fall or balance concerns reported    No    Warm-up and Cool-down Performed on first and last piece of equipment    Resistance Training Performed Yes    VAD Patient? No    PAD/SET Patient? No      Pain Assessment   Currently in Pain? No/denies              Social History   Tobacco Use  Smoking Status Former Smoker  . Packs/day: 1.00  . Years: 40.00  . Pack years: 40.00  . Types: Cigarettes  . Quit date: 08/2015  . Years since quitting: 4.6  Smokeless Tobacco Never Used  Tobacco Comment   Quit 2017      Goals Met:  Independence with exercise equipment Exercise tolerated well No report of cardiac concerns or symptoms Strength training completed today  Goals Unmet:  Not Applicable  Comments: Pt able to follow exercise prescription today without complaint.  Will continue to monitor for progression.    Dr. Emily Filbert is Medical Director for [place] and [name].

## 2020-04-02 ENCOUNTER — Telehealth: Payer: Self-pay

## 2020-04-02 NOTE — Telephone Encounter (Signed)
Patient called- thinks he has gout and/or neuropathy with his feet and is hard to ambulate. Patient will not be in rehab today. He is contacting his doctor and will let us know how he feels for the remaining of the week.

## 2020-04-04 ENCOUNTER — Other Ambulatory Visit: Payer: Self-pay

## 2020-04-04 ENCOUNTER — Encounter: Payer: Medicare Other | Admitting: *Deleted

## 2020-04-04 DIAGNOSIS — I5022 Chronic systolic (congestive) heart failure: Secondary | ICD-10-CM

## 2020-04-04 DIAGNOSIS — I208 Other forms of angina pectoris: Secondary | ICD-10-CM | POA: Diagnosis not present

## 2020-04-04 NOTE — Progress Notes (Signed)
Daily Session Note  Patient Details  Name: Kevin Pearson MRN: [id number] Date of Birth: 1951-06-07 Referring Provider:     Cardiac Rehab from 11/27/2019 in [facility] Cardiac and Pulmonary Rehab  Referring Provider Ida Rogue MD      Encounter Date: 04/04/2020  Check In:  Session Check In - 04/04/20 1118      Check-In   Supervising physician immediately available to respond to emergencies See telemetry face sheet for immediately available ER MD    Location [facility]-Cardiac & Pulmonary Rehab    Staff Present Renita Papa, RN BSN;Joseph Lou Min[place], [place] Exercise Physiologist;Melissa Altoona RDN, Rowe Pavy, BA, ACSM CEP, Exercise Physiologist    Virtual Visit No    Medication changes reported     No    Fall or balance concerns reported    No    Warm-up and Cool-down Performed on first and last piece of equipment    Resistance Training Performed Yes    VAD Patient? No    PAD/SET Patient? No      Pain Assessment   Currently in Pain? No/denies              Social History   Tobacco Use  Smoking Status Former Smoker  . Packs/day: 1.00  . Years: 40.00  . Pack years: 40.00  . Types: Cigarettes  . Quit date: 08/2015  . Years since quitting: 4.6  Smokeless Tobacco Never Used  Tobacco Comment   Quit 2017      Goals Met:  Independence with exercise equipment Exercise tolerated well No report of cardiac concerns or symptoms Strength training completed today  Goals Unmet:  Not Applicable  Comments: Pt able to follow exercise prescription today without complaint.  Will continue to monitor for progression.    Dr. Emily Filbert is Medical Director for [place] and [name].

## 2020-04-10 ENCOUNTER — Encounter: Payer: Self-pay | Admitting: *Deleted

## 2020-04-10 DIAGNOSIS — I208 Other forms of angina pectoris: Secondary | ICD-10-CM

## 2020-04-10 NOTE — Progress Notes (Signed)
Cardiac Individual Treatment Plan  Patient Details  Name: Kevin Pearson MRN: [id number] Date of Birth: [birth year] Referring Provider:     Cardiac Rehab from 11/27/2019 in [facility] Cardiac and Pulmonary Rehab  Referring Provider Ida Rogue MD      Initial Encounter Date:    Cardiac Rehab from 11/27/2019 in [facility] Cardiac and Pulmonary Rehab  Date 11/27/19      Visit Diagnosis: Chronic stable angina ([place])  Patient's Home Medications on Admission:  Current Outpatient Medications:  .  acetaminophen (TYLENOL) 325 MG tablet, Take 650 mg by mouth every 6 (six) hours as needed for mild pain or headache. , Disp: , Rfl:  .  albuterol (VENTOLIN HFA) 108 (90 Base) MCG/ACT inhaler, Inhale 2 puffs into the lungs every 6 (six) hours as needed for wheezing or shortness of breath., Disp: 18 g, Rfl: 1 .  busPIRone (BUSPAR) 10 MG tablet, Take 10 mg by mouth 3 (three) times daily., Disp: , Rfl:  .  Carboxymethylcell-Glycerin PF (REFRESH OPTIVE PF) 0.5-0.9 % SOLN, Place 1 drop into both eyes daily as needed (Dry eye)., Disp: , Rfl:  .  clopidogrel (PLAVIX) 75 MG tablet, Take 75 mg by mouth daily., Disp: , Rfl:  .  divalproex (DEPAKOTE) 250 MG DR tablet, Take 750 mg by mouth 2 (two) times daily. , Disp: , Rfl:  .  gabapentin (NEURONTIN) 300 MG capsule, Take 600 mg by mouth 2 (two) times daily. , Disp: , Rfl:  .  Magnesium Oxide 400 MG CAPS, Take 1 capsule (400 mg total) by mouth daily., Disp: 90 capsule, Rfl: 3 .  Melatonin 5 MG TABS, Take 5 mg by mouth at bedtime. , Disp: , Rfl:  .  metoprolol succinate (TOPROL-XL) 50 MG 24 hr tablet, TAKE 1 TABLET BY MOUTH EVERY DAY WITH OR IMMEDIATELY AFTER MEAL (Patient taking differently: Take 50 mg by mouth daily. OR IMMEDIATELY AFTER MEAL), Disp: 90 tablet, Rfl: 3 .  Multiple Vitamin (MULTIVITAMIN) tablet, Take 1 tablet by mouth daily., Disp: , Rfl:  .  nitroGLYCERIN (NITROSTAT) 0.4 MG SL tablet, Place 1 tablet (0.4 mg total) under the tongue every 5 (five) minutes as  needed for chest pain., Disp: 25 tablet, Rfl: 11 .  omeprazole (PRILOSEC) 40 MG capsule, Take 1 capsule (40 mg total) by mouth 2 (two) times daily., Disp: 90 capsule, Rfl: 3 .  rivaroxaban (XARELTO) 20 MG TABS tablet, Take 20 mg by mouth daily with supper. , Disp: , Rfl:  .  rosuvastatin (CRESTOR) 20 MG tablet, Take 20 mg by mouth at bedtime. , Disp: , Rfl:  .  senna (SENOKOT) 8.6 MG tablet, Take 2 tablets by mouth 2 (two) times daily. , Disp: , Rfl:  .  tamsulosin (FLOMAX) 0.4 MG CAPS capsule, TAKE ONE CAPSULE BY MOUTH EVERY DAY AFTER SUPPER (Patient taking differently: Take 0.4 mg by mouth daily. ), Disp: 30 capsule, Rfl: 11 .  torsemide (DEMADEX) 20 MG tablet, TAKE 2 TABLETS BY MOUTH TWICE A DAY (Patient taking differently: Take 40 mg by mouth daily. ), Disp: 360 tablet, Rfl: 0  Past Medical History: Past Medical History:  Diagnosis Date  . Acid reflux   . Anxiety   . Arthritis   . Atrial fibrillation ([place])   . CHF (congestive heart failure) ([place])   . Chronic orthostatic hypotension   . Clotting disorder ([place])   . COPD (chronic obstructive pulmonary disease) ([place])   . Depression   . Elevated PSA   . Heart attack ([place])   .  Heart disease   . Heart failure ([place])   . Hepatitis   . High cholesterol   . Hypertension   . Sleep apnea   . TBI (traumatic brain injury) ([place])   . Urinary retention     Tobacco Use: Social History   Tobacco Use  Smoking Status Former Smoker  . Packs/day: 1.00  . Years: 40.00  . Pack years: 40.00  . Types: Cigarettes  . Quit date: 08/2015  . Years since quitting: 4.6  Smokeless Tobacco Never Used  Tobacco Comment   Quit 2017      Labs: Recent Review Flowsheet Data    Labs for ITP Cardiac and Pulmonary Rehab Latest Ref Rng & Units 11/23/2017   Cholestrol 100 - 199 mg/dL 114   LDLCALC 0 - 99 mg/dL 60   HDL >39 mg/dL 30(L)   Trlycerides 0 - 149 mg/dL 119       Exercise Target Goals: Exercise Program Goal: Individual exercise prescription  set using results from initial 6 min walk test and THRR while considering  patient's activity barriers and safety.   Exercise Prescription Goal: Initial exercise prescription builds to 30-45 minutes a day of aerobic activity, 2-3 days per week.  Home exercise guidelines will be given to patient during program as part of exercise prescription that the participant will acknowledge.   Education: Aerobic Exercise & Resistance Training: - Gives group verbal and written instruction on the various components of exercise. Focuses on aerobic and resistive training programs and the benefits of this training and how to safely progress through these programs..   Cardiac Rehab from 03/24/2018 in [facility] Cardiac and Pulmonary Rehab  Date 02/10/18  Educator AS  Instruction Review Code 1- Verbalizes Understanding      Education: Exercise & Equipment Safety: - Individual verbal instruction and demonstration of equipment use and safety with use of the equipment.   Cardiac Rehab from 04/04/2020 in [facility] Cardiac and Pulmonary Rehab  Date 11/27/19  Educator [facility]  Instruction Review Code 1- Verbalizes Understanding      Education: Exercise Physiology & General Exercise Guidelines: - Group verbal and written instruction with models to review the exercise physiology of the cardiovascular system and associated critical values. Provides general exercise guidelines with specific guidelines to those with heart or lung disease.    Cardiac Rehab from 03/24/2018 in [facility] Cardiac and Pulmonary Rehab  Date 02/01/18  Educator [facility]  Instruction Review Code 1- Verbalizes Understanding      Education: Flexibility, Balance, Mind/Body Relaxation: Provides group verbal/written instruction on the benefits of flexibility and balance training, including mind/body exercise modes such as yoga, pilates and tai chi.  Demonstration and skill practice provided.   Cardiac Rehab from 03/24/2018 in [facility] Cardiac and Pulmonary Rehab  Date  12/23/17  Educator AS  Instruction Review Code 1- Verbalizes Understanding      Activity Barriers & Risk Stratification:  Activity Barriers & Cardiac Risk Stratification - 11/27/19 1258      Activity Barriers & Cardiac Risk Stratification   Activity Barriers Shortness of Breath;Back Problems;Joint Problems;Decreased Ventricular Function;Deconditioning;History of Falls;Balance Concerns;Neck/Spine Problems;Muscular Weakness    Cardiac Risk Stratification High           6 Minute Walk:  6 Minute Walk    Row Name 11/27/19 1257         6 Minute Walk   Phase Initial     Distance 1045 feet     Walk Time 6 minutes     MPH 1.98  RPE 13     Perceived Dyspnea  3     VO2 Peak 7.48     Symptoms Yes (comment)     Comments SOB, neuropathy     Resting HR 75 bpm     Resting BP 146/64     Resting Oxygen Saturation  99 %     Exercise Oxygen Saturation  during 6 min walk 99 %     Max Ex. HR 103 bpm     Max Ex. BP 126/64     2 Minute Post BP 126/64            Oxygen Initial Assessment:  Oxygen Initial Assessment - 11/22/19 1113      Home Oxygen   Home Oxygen Device Home Concentrator    Sleep Oxygen Prescription Continuous    Liters per minute 2    Home Exercise Oxygen Prescription None    Home Resting Oxygen Prescription None    Compliance with Home Oxygen Use Yes      Intervention   Short Term Goals To learn and understand importance of monitoring SPO2 with pulse oximeter and demonstrate accurate use of the pulse oximeter.;To learn and understand importance of maintaining oxygen saturations>88%;To learn and demonstrate proper pursed lip breathing techniques or other breathing techniques.    Long  Term Goals Exhibits compliance with exercise, home and travel O2 prescription;Verbalizes importance of monitoring SPO2 with pulse oximeter and return demonstration;Maintenance of O2 saturations>88%;Exhibits proper breathing techniques, such as pursed lip breathing or other method  taught during program session           Oxygen Re-Evaluation:   Oxygen Discharge (Final Oxygen Re-Evaluation):   Initial Exercise Prescription:  Initial Exercise Prescription - 11/27/19 1300      Date of Initial Exercise RX and Referring Provider   Date 11/27/19    Referring Provider Ida Rogue MD      Treadmill   MPH 1.5    Grade 0.5    Minutes 15    METs 2.25      Recumbant Bike   Level 1    RPM 50    Watts 5    Minutes 15    METs 2      NuStep   Level 1    SPM 80    Minutes 15    METs 2      REL-XR   Level 1    Speed 50    Minutes 15    METs 2      Prescription Details   Frequency (times per week) 2    Duration Progress to 30 minutes of continuous aerobic without signs/symptoms of physical distress      Intensity   THRR 40-80% of Max Heartrate 106-137    Ratings of Perceived Exertion 11-13    Perceived Dyspnea 0-4      Progression   Progression Continue to progress workloads to maintain intensity without signs/symptoms of physical distress.      Resistance Training   Training Prescription Yes    Weight 3 lb    Reps 10-15           Perform Capillary Blood Glucose checks as needed.  Exercise Prescription Changes:  Exercise Prescription Changes    Row Name 11/27/19 1300 12/14/19 1100 01/09/20 1500 01/22/20 1300 02/05/20 1700     Response to Exercise   Blood Pressure (Admit) 1'46/64 98/70 94/64 ' 112/62 126/70   Blood Pressure (Exercise) 126/64 112/60 128/60 116/64 122/62   Blood Pressure (  Exit) 126/64 86/62 102/60 98/64 122/64   Heart Rate (Admit) 75 bpm 65 bpm 76 bpm 75 bpm 76 bpm   Heart Rate (Exercise) 103 bpm 102 bpm 127 bpm 94 bpm 110 bpm   Heart Rate (Exit) 75 bpm 75 bpm 75 bpm 78 bpm 76 bpm   Oxygen Saturation (Admit) 99 % -- -- -- --   Oxygen Saturation (Exercise) 99 % -- -- -- --   Rating of Perceived Exertion (Exercise) '13 12 12 12 13   ' Perceived Dyspnea (Exercise) 3 -- -- -- --   Symptoms SOB low BP  -- none none    Comments walk test results third full day of exercise -- -- --   Duration -- Continue with 30 min of aerobic exercise without signs/symptoms of physical distress. Continue with 30 min of aerobic exercise without signs/symptoms of physical distress. Continue with 30 min of aerobic exercise without signs/symptoms of physical distress. Continue with 30 min of aerobic exercise without signs/symptoms of physical distress.   Intensity -- THRR unchanged THRR unchanged THRR unchanged THRR unchanged     Progression   Progression -- Continue to progress workloads to maintain intensity without signs/symptoms of physical distress. Continue to progress workloads to maintain intensity without signs/symptoms of physical distress. Continue to progress workloads to maintain intensity without signs/symptoms of physical distress. Continue to progress workloads to maintain intensity without signs/symptoms of physical distress.   Average METs -- 2.18 2 2.33 2     Resistance Training   Training Prescription -- Yes Yes Yes Yes   Weight -- 3 lb 3 lb 5 lb 5 lb   Reps -- 10-15 10-15 10-15 10-15     Interval Training   Interval Training -- No No No --     Treadmill   MPH -- 1.5 -- 1.7 --   Grade -- 0.5 -- 0.5 --   Minutes -- 15 -- 15 --   METs -- 2.25 -- 2.42 --     Recumbant Bike   Level -- -- 1 2 --   RPM -- -- 50 -- --   Watts -- -- -- 19 --   Minutes -- -- 15 15 --   METs -- -- 2 2.6 --     Arm Ergometer   Level -- -- 1 -- --   Minutes -- -- 15 -- --   METs -- -- 2 -- --     REL-XR   Level -- 1 -- 3 --   Minutes -- 15 -- 15 --   METs -- 2.1 -- 2.3 --     Home Exercise Plan   Plans to continue exercise at -- -- -- [employer] (comment)  Neurosurgeon --   Frequency -- -- -- Add 2 additional days to program exercise sessions. --   Initial Home Exercises Provided -- -- -- 12/25/19 --   [place] Name 02/20/20 0900 04/01/20 1200           Response to Exercise   Blood Pressure (Admit) 108/66  108/68      Blood Pressure (Exercise) 120/70 148/70      Blood Pressure (Exit) 124/74 122/60      Heart Rate (Admit) 82 bpm 118 bpm      Heart Rate (Exercise) 100 bpm 103 bpm      Heart Rate (Exit) 78 bpm 89 bpm      Rating of Perceived Exertion (Exercise) 13 12      Symptoms none none  Duration Continue with 30 min of aerobic exercise without signs/symptoms of physical distress. Continue with 30 min of aerobic exercise without signs/symptoms of physical distress.      Intensity THRR unchanged THRR unchanged        Progression   Progression Continue to progress workloads to maintain intensity without signs/symptoms of physical distress. Continue to progress workloads to maintain intensity without signs/symptoms of physical distress.      Average METs 2.7 1.9        Resistance Training   Training Prescription Yes Yes      Weight 5 lb 5 lb      Reps 10-15 10-15        Interval Training   Interval Training No No        Treadmill   MPH 1.8 --      Grade 0.5 --      Minutes 15 --      METs 2.5 --        Recumbant Bike   Level 7 --      Minutes 15 --        NuStep   Level -- 6      SPM -- 80      Minutes -- 15      METs -- 1.9        Arm Ergometer   Level 1 --      Minutes 15 --      METs 2 --        REL-XR   Level 8 --      Minutes 15 --      METs 3.6 --        Home Exercise Plan   Plans to continue exercise at [employer] (comment)  [place] --      Frequency Add 2 additional days to program exercise sessions. --      Initial Home Exercises Provided 12/25/19 --             Exercise Comments:   Exercise Goals and Review:  Exercise Goals    Row Name 11/27/19 1310             Exercise Goals   Increase Physical Activity Yes       Intervention Provide advice, education, support and counseling about physical activity/exercise needs.;Develop an individualized exercise prescription for aerobic and resistive training based on initial  evaluation findings, risk stratification, comorbidities and participant's personal goals.       Expected Outcomes Short Term: Attend rehab on a regular basis to increase amount of physical activity.;Long Term: Add in home exercise to make exercise part of routine and to increase amount of physical activity.;Long Term: Exercising regularly at least 3-5 days a week.       Able to understand and use rate of perceived exertion (RPE) scale Yes       Intervention Provide education and explanation on how to use RPE scale       Expected Outcomes Short Term: Able to use RPE daily in rehab to express subjective intensity level;Long Term:  Able to use RPE to guide intensity level when exercising independently       Able to understand and use Dyspnea scale Yes       Intervention Provide education and explanation on how to use Dyspnea scale       Expected Outcomes Short Term: Able to use Dyspnea scale daily in rehab to express subjective sense of shortness of breath during exertion;Long Term:  Able to use Dyspnea scale to guide intensity level when exercising independently       Knowledge and understanding of Target Heart Rate Range (THRR) Yes       Intervention Provide education and explanation of THRR including how the numbers were predicted and where they are located for reference       Expected Outcomes Short Term: Able to state/look up THRR;Long Term: Able to use THRR to govern intensity when exercising independently;Short Term: Able to use daily as guideline for intensity in rehab       Able to check pulse independently Yes       Intervention Provide education and demonstration on how to check pulse in carotid and radial arteries.;Review the importance of being able to check your own pulse for safety during independent exercise       Expected Outcomes Short Term: Able to explain why pulse checking is important during independent exercise;Long Term: Able to check pulse independently and accurately        Understanding of Exercise Prescription Yes       Intervention Provide education, explanation, and written materials on patient's individual exercise prescription       Expected Outcomes Short Term: Able to explain program exercise prescription;Long Term: Able to explain home exercise prescription to exercise independently              Exercise Goals Re-Evaluation :  Exercise Goals Re-Evaluation    Row Name 12/14/19 1142 12/25/19 1428 01/02/20 1135 01/09/20 1507 01/22/20 1306     Exercise Goal Re-Evaluation   Exercise Goals Review Increase Physical Activity;Increase Strength and Stamina;Understanding of Exercise Prescription -- Increase Physical Activity;Increase Strength and Stamina;Understanding of Exercise Prescription Increase Physical Activity;Increase Strength and Stamina;Understanding of Exercise Prescription Increase Physical Activity;Increase Strength and Stamina;Understanding of Exercise Prescription   Comments Kevin Pearson is off to a good start in rehab.  He has completed his first three full days of exercise.  We will continue monitor his progress. Out since last review Kevin Pearson has returned and doing well in rehab.  He has some weights are home that he has been doing. He is considering going back to [employer] again.  We talked about starting light back on the weight machines.  He really wants his stamina back faster.  Reviewed home exercise with pt today.  Pt plans to join [employer] for exercise.  Reviewed THR, pulse, RPE, sign and symptoms, pulse oximetery and when to call 911 or MD.  Also discussed weather considerations and indoor options.  Pt voiced understanding. Kevin Pearson is tolerating exercise well.  Staff will encourage him to increase levels on machines and weights. Kevin Pearson has been doing well in rehab.  He is now up to level 3 on the XR and up to 1.7 mph on the treadmill.   We will continue to monitor his progress.   Expected Outcomes Short: Continue to attend rehab regularly   Long: Continue to improve stamina. -- Short: Start getting back to gym on off days Long; Continue to improve stamina. Short: increase levels on machines Long: improve MET level Short: Continue to increase workloads Long: Continue to improve strength and stamina.   [place] Name 01/30/20 1120 02/20/20 0923 03/19/20 0910 03/21/20 1142 04/01/20 1300     Exercise Goal Re-Evaluation   Exercise Goals Review Increase Physical Activity;Increase Strength and Stamina;Understanding of Exercise Prescription Increase Physical Activity;Increase Strength and Stamina;Understanding of Exercise Prescription -- Increase Physical Activity;Increase Strength and Stamina;Understanding of Exercise Prescription Increase Physical Activity;Increase Strength and  Stamina;Understanding of Exercise Prescription   Comments Kevin Pearson has been doing well in rehab.  He is still going to gym on off days to do 30 min on treadmill or ellipitcal.  He feels that his strength and stamina are starting to improve.  Kevin Pearson was able to walk down here on his own today!! Kevin Pearson will be out for the next couple of weeks with a family emergency. He was encouraged to continue to walk while away from rehab.  He has worked his way up to 3.6 METs on the XR.  We will continue to monitor his progress. Out since last review.  Supposed to return on 03/21/20. Kevin Pearson has not been exercising since his break from rehab, but has retured and will start the gym next week. Kevin Pearson had some trouble with neuropathy so only did the NS today.   Expected Outcomes Short: Continue to exercise on off days  Long: Continue to improve stamina. Short: Walk while out of rehab.  Long: Return to rehab with good attendance. -- Short: go to the gym to exercise while out of rehab.  Long: Return to rehab with good attendance. Short: get back to consistent exercise Long: build overall stamina          Discharge Exercise Prescription (Final Exercise Prescription Changes):  Exercise Prescription  Changes - 04/01/20 1200      Response to Exercise   Blood Pressure (Admit) 108/68    Blood Pressure (Exercise) 148/70    Blood Pressure (Exit) 122/60    Heart Rate (Admit) 118 bpm    Heart Rate (Exercise) 103 bpm    Heart Rate (Exit) 89 bpm    Rating of Perceived Exertion (Exercise) 12    Symptoms none    Duration Continue with 30 min of aerobic exercise without signs/symptoms of physical distress.    Intensity THRR unchanged      Progression   Progression Continue to progress workloads to maintain intensity without signs/symptoms of physical distress.    Average METs 1.9      Resistance Training   Training Prescription Yes    Weight 5 lb    Reps 10-15      Interval Training   Interval Training No      NuStep   Level 6    SPM 80    Minutes 15    METs 1.9           Nutrition:  Target Goals: Understanding of nutrition guidelines, daily intake of sodium <1580m, cholesterol <2017m calories 30% from fat and 7% or less from saturated fats, daily to have 5 or more servings of fruits and vegetables.  Education: Controlling Sodium/Reading Food Labels -Group verbal and written material supporting the discussion of sodium use in heart healthy nutrition. Review and explanation with models, verbal and written materials for utilization of the food label.   [facility]  Date 03/24/18  Educator LB  Instruction Review Code 1- Verbalizes Understanding      Education: General Nutrition Guidelines/Fats and Fiber: -Group instruction provided by verbal, written material, models and posters to present the general guidelines for heart healthy nutrition. Gives an explanation and review of dietary fats and fiber.   [facility]  Date 03/15/18  Educator LB  Instruction Review Code 1- Verbalizes Understanding      Biometrics:  Pre Biometrics - 11/27/19 1310      Pre Biometrics    Height  5' 7.2" (1.707 m)    Weight 228 lb 14.4 oz (103.8 kg)    BMI (Calculated) 35.63    Single Leg Stand 9.91 seconds            Nutrition Therapy Plan and Nutrition Goals:  Nutrition Therapy & Goals - 12/26/19 1128      Nutrition Therapy   Diet Heart healthy    Drug/Food Interactions Statins/Certain Fruits    Protein (specify units) 70-75g    Fiber 30 grams    Whole Grain Foods 3 servings    Saturated Fats 12 max. grams    Fruits and Vegetables 5 servings/day    Sodium 1.5 grams      Personal Nutrition Goals   Nutrition Goal ST: add in protein (hummus + and veggies) LT: weight to 200 lbs, improve lung capactiy, and strengthen heart, increase muscle mass    Comments B: bacon egg and cheese biscuit or eggs at home and have baked bacon (omelette) with english muffin or toast - coffee and no sugar, orange juice (takes pills) L: 2 tacos - eats lots of [place] food, ice water with lemon (sometimes beer with chips and salsa), or two chicken enchiladas ([place] - restaurant "[employer] [place]" or "[employer]"  D: Varies: chicken cattitorie (baked) - with fresh homemade sauce, and fresh vegetables, with some pasta (white). Still having dysphagia - his family got surgery. 3-4 diet pepsis and then water. His dysphagia depends on how mindful he is. Reviewed heart healthy eating. Pt reports having lots of vegetables and beans during dinner. Pt would like to increase protein.      Intervention Plan   Intervention Prescribe, educate and counsel regarding individualized specific dietary modifications aiming towards targeted core components such as weight, hypertension, lipid management, diabetes, heart failure and other comorbidities.    Expected Outcomes Short Term Goal: Understand basic principles of dietary content, such as calories, fat, sodium, cholesterol and nutrients.;Short Term Goal: A plan has been developed with personal nutrition goals set during dietitian appointment.;Long Term Goal:  Adherence to prescribed nutrition plan.           Nutrition Assessments:  Nutrition Assessments - 11/27/19 1322      MEDFICTS Scores   Pre Score 61           MEDIFICTS Score Key:          ?70 Need to make dietary changes          40-70 Heart Healthy Diet         ? 40 Therapeutic Level Cholesterol Diet  Nutrition Goals Re-Evaluation:  Nutrition Goals Re-Evaluation    [place] Name 01/02/20 1142 01/30/20 1121 03/12/20 1009 03/21/20 1132       Goals   Nutrition Goal ST: add in protein (hummus + and veggies) LT: weight to 200 lbs, improve lung capactiy, and strengthen heart, increase muscle mass Heart healthier eating Continue changes Pt is seeing an outside dietitian at this time.    Comment Continue to work on new changes set forth with Melissa last week. Callan is meeting with a nutritionist on his own this afternoon Continue with current changes Pt is seeing an outside dietitian at this time.    Expected Outcome Short: Add in protein Long; Continue to work on weight loss. Short: Add in protein Long; Continue to work on weight loss. Continue changes Pt is seeing an outside dietitian at this time.           Nutrition Goals Discharge (Final Nutrition  Goals Re-Evaluation):  Nutrition Goals Re-Evaluation - 03/21/20 1132      Goals   Nutrition Goal Pt is seeing an outside dietitian at this time.    Comment Pt is seeing an outside dietitian at this time.    Expected Outcome Pt is seeing an outside dietitian at this time.           Psychosocial: Target Goals: Acknowledge presence or absence of significant depression and/or stress, maximize coping skills, provide positive support system. Participant is able to verbalize types and ability to use techniques and skills needed for reducing stress and depression.   Education: Depression - Provides group verbal and written instruction on the correlation between heart/lung disease and depressed mood, treatment options, and the stigmas  associated with seeking treatment.   Cardiac Rehab from 03/24/2018 in [facility] Cardiac and Pulmonary Rehab  Date 11/17/17  Educator [facility]  Instruction Review Code 1- [employer] Understanding      Education: Sleep Hygiene -Provides group verbal and written instruction about how sleep can affect your health.  Define sleep hygiene, discuss sleep cycles and impact of sleep habits. Review good sleep hygiene tips.    Cardiac Rehab from 03/24/2018 in [facility] Cardiac and Pulmonary Rehab  Date 02/03/18  Educator [facility]  Instruction Review Code 1- Verbalizes Understanding       Education: Stress and Anxiety: - Provides group verbal and written instruction about the health risks of elevated stress and causes of high stress.  Discuss the correlation between heart/lung disease and anxiety and treatment options. Review healthy ways to manage with stress and anxiety.   Cardiac Rehab from 10/27/2016 in [facility] Cardiac and Pulmonary Rehab  Date 08/13/16  Educator TS  Instruction Review Code (retired) 2- meets goals/outcomes       Initial Review & Psychosocial Screening:  Initial Psych Review & Screening - 11/22/19 1114      Initial Review   Current issues with History of Depression;Current Stress Concerns;Current Anxiety/Panic    Source of Stress Concerns Family    Comments Stress with his daughter.  Has chronic anxiety is controlled with meds      Family Dynamics   Good Support System? No    Concerns No support system    Comments Stress currently with his daughter. Ex Wife does not drive.    NO current support system      Barriers   Psychosocial barriers to participate in program There are no identifiable barriers or psychosocial needs.      Screening Interventions   Interventions Encouraged to exercise    Expected Outcomes Short Term goal: Utilizing psychosocial counselor, staff and physician to assist with identification of specific Stressors or current issues interfering with healing process. Setting  desired goal for each stressor or current issue identified.;Long Term Goal: Stressors or current issues are controlled or eliminated.;Short Term goal: Identification and review with participant of any Quality of Life or Depression concerns found by scoring the questionnaire.;Long Term goal: The participant improves quality of Life and PHQ9 Scores as seen by post scores and/or verbalization of changes           Quality of Life Scores:   Quality of Life - 11/27/19 1311      Quality of Life   Select Quality of Life      Quality of Life Scores   Health/Function Pre 15.53 %    Socioeconomic Pre 19.5 %    Psych/Spiritual Pre 24 %    Family Pre 24.38 %  GLOBAL Pre 19.23 %          Scores of 19 and below usually indicate a poorer quality of life in these areas.  A difference of  2-3 points is a clinically meaningful difference.  A difference of 2-3 points in the total score of the Quality of Life Index has been associated with significant improvement in overall quality of life, self-image, physical symptoms, and general health in studies assessing change in quality of life.  PHQ-9: Recent Review Flowsheet Data    Depression screen [facility] 2/9 12/26/2019 11/27/2019 03/15/2018 12/27/2017 12/02/2017   Decreased Interest '1 2 1 ' 0 2   Down, Depressed, Hopeless '1 1 1 ' 0 2   PHQ - 2 Score '2 3 2 ' 0 4   Altered sleeping 0 1 1 - 1   Tired, decreased energy '1 2 1 ' - 1   Change in appetite 0 1 1 - 1   Feeling bad or failure about yourself  0 0 1 - 1   Trouble concentrating '1 1 1 ' - 1   Moving slowly or fidgety/restless 0 0 3 - 1   Suicidal thoughts 0 0 0 - 0   PHQ-9 Score '4 8 10 ' - 10   Difficult doing work/chores Not difficult at all Somewhat difficult Somewhat difficult - Somewhat difficult     Interpretation of Total Score  Total Score Depression Severity:  1-4 = Minimal depression, 5-9 = Mild depression, 10-14 = Moderate depression, 15-19 = Moderately severe depression, 20-27 = Severe depression    Psychosocial Evaluation and Intervention:  Psychosocial Evaluation - 11/22/19 1125      Psychosocial Evaluation & Interventions   Comments Kevin Pearson has a new referral to the program after attending in 2019. He is ready to return and has no identified barriers to entry. He lives alone in an apartment complex with his dog. He has lost 41 pounds since his last time here and wants to continue his weight loss at 1 - 2 pounds a week until his goalof 200 pounds.He continues to have stressrelated to his relationship with his daughter. HIs son lives in [place]. He wants to get stronger, breath better and continue his weight loss pattern.  He should do well with the program.    Expected Outcomes Short:  Kevin Pearson will improve his breathing with exercise.   Long:  Kevin Pearson will cope more positively with skills learned and increase his support system for more positive interactions.      Continue Psychosocial Services  Follow up required by staff           Psychosocial Re-Evaluation:  Psychosocial Re-Evaluation    Kevin Pearson Name 01/02/20 1137 01/30/20 1123 03/21/20 1134         Psychosocial Re-Evaluation   Current issues with Current Stress Concerns Current Stress Concerns Current Anxiety/Panic;Current Stress Concerns;Current Depression;History of Depression     Comments Kevin Pearson is doing well in rehab.  He thinks his gout has flared up again from extra fluid.  He continues to struggle with his relationship with his daughter but is able to talk to his son.  He is sleeping pretty good.  He is up late at night with mental disturbances from worring about his dog.  His dog got really sick last week and determined to be a diabetic.  He took him to the emergency vet for care.  Things are starting to calm down now and starting to improve.  He is 69 years old. Kevin Pearson is doing well.  He is now giving his dog shots daily which neither of them like.  The dog is also having some incontenence issues now and needs to clean the  floor.  He is doing what he can to make his dog comfortable.   He is sleeping well.  He just lost his friend yesterday and they are still not sure what really caused his death. Son has almost got hurt very badly and his dog has died recently which is causing him stress. No support, ask PCP about talking with a therapist. He is on medicaiton to help control his stress and depression. He reports sleeping well.     Expected Outcomes Short: Continue to care for dog and find outlets for stress Long; Continue to work on Radiographer, therapeutic. Short; Continue to care for dog.  Long; Continue to use exercise for mental boost. ST: ask PCP about therapy, continue with medication LT: reduce stress, anxiety, and depression     Interventions Stress management education;Encouraged to attend Cardiac Rehabilitation for the exercise Stress management education;Encouraged to attend Cardiac Rehabilitation for the exercise Stress management education;Encouraged to attend Cardiac Rehabilitation for the exercise  advised talking with a therapist     Continue Psychosocial Services  Follow up required by staff Follow up required by staff Follow up required by staff     Comments -- -- He has chronic depression and anxiety controlled with medication. Recommended therapy as well       Initial Review   Source of Stress Concerns -- -- Family;Poor Coping Skills  recent loss of dog            Psychosocial Discharge (Final Psychosocial Re-Evaluation):  Psychosocial Re-Evaluation - 03/21/20 1134      Psychosocial Re-Evaluation   Current issues with Current Anxiety/Panic;Current Stress Concerns;Current Depression;History of Depression    Comments Son has almost got hurt very badly and his dog has died recently which is causing him stress. No support, ask PCP about talking with a therapist. He is on medicaiton to help control his stress and depression. He reports sleeping well.    Expected Outcomes ST: ask PCP about therapy, continue with  medication LT: reduce stress, anxiety, and depression    Interventions Stress management education;Encouraged to attend Cardiac Rehabilitation for the exercise   advised talking with a therapist   Continue Psychosocial Services  Follow up required by staff    Comments He has chronic depression and anxiety controlled with medication. Recommended therapy as well      Initial Review   Source of Stress Concerns Family;Poor Coping Skills   recent loss of dog          Vocational Rehabilitation: Provide vocational rehab assistance to qualifying candidates.   Vocational Rehab Evaluation & Intervention:  Vocational Rehab - 11/22/19 1121      Initial Vocational Rehab Evaluation & Intervention   Assessment shows need for Vocational Rehabilitation No           Education: Education Goals: Education classes will be provided on a variety of topics geared toward better understanding of heart health and risk factor modification. Participant will state understanding/return demonstration of topics presented as noted by education test scores.  Learning Barriers/Preferences:   General Cardiac Education Topics:  AED/CPR: - Group verbal and written instruction with the use of models to demonstrate the basic use of the AED with the basic ABC's of resuscitation.   Cardiac Rehab from 03/24/2018 in [facility] Cardiac and Pulmonary Rehab  Date 03/22/18  Educator [place]  Instruction Review Code 1- Verbalizes Understanding      Anatomy & Physiology of the Heart: - Group verbal and written instruction and models provide basic cardiac anatomy and physiology, with the coronary electrical and arterial systems. Review of Valvular disease and Heart Failure   Cardiac Rehab from 03/24/2018 in [facility] Cardiac and Pulmonary Rehab  Date 02/24/18  Educator CE  Instruction Review Code 1- Verbalizes Understanding      Cardiac Procedures: - Group verbal and written instruction to review commonly prescribed medications for  heart disease. Reviews the medication, class of the drug, and side effects. Includes the steps to properly store meds and maintain the prescription regimen. (beta blockers and nitrates)   Cardiac Rehab from 03/24/2018 in [facility] Cardiac and Pulmonary Rehab  Date 03/17/18  Educator CE  Instruction Review Code 1- Verbalizes Understanding      Cardiac Medications I: - Group verbal and written instruction to review commonly prescribed medications for heart disease. Reviews the medication, class of the drug, and side effects. Includes the steps to properly store meds and maintain the prescription regimen.   Cardiac Rehab from 04/04/2020 in [facility] Cardiac and Pulmonary Rehab  Date 03/21/20  Educator SB  Instruction Review Code 1- Verbalizes Understanding      Cardiac Medications II: -Group verbal and written instruction to review commonly prescribed medications for heart disease. Reviews the medication, class of the drug, and side effects. (all other drug classes)   Cardiac Rehab from 04/04/2020 in [facility] Cardiac and Pulmonary Rehab  Date 03/28/20  Educator SB  Instruction Review Code 1- Verbalizes Understanding       Go Sex-Intimacy & Heart Disease, Get SMART - Goal Setting: - Group verbal and written instruction through game format to discuss heart disease and the return to sexual intimacy. Provides group verbal and written material to discuss and apply goal setting through the application of the S.M.A.R.T. Method.   Cardiac Rehab from 03/24/2018 in [facility] Cardiac and Pulmonary Rehab  Date 03/17/18  Educator CE  Instruction Review Code 1- Verbalizes Understanding      Other Matters of the Heart: - Provides group verbal, written materials and models to describe Stable Angina and Peripheral Artery. Includes description of the disease process and treatment options available to the cardiac patient.   Cardiac Rehab from 03/24/2018 in [facility] Cardiac and Pulmonary Rehab  Date 02/24/18  Educator CE   Instruction Review Code 1- Verbalizes Understanding      Infection Prevention: - Provides verbal and written material to individual with discussion of infection control including proper hand washing and proper equipment cleaning during exercise session.   Cardiac Rehab from 04/04/2020 in [facility] Cardiac and Pulmonary Rehab  Date 11/27/19  Educator [facility]  Instruction Review Code 1- Verbalizes Understanding      Falls Prevention: - Provides verbal and written material to individual with discussion of falls prevention and safety.   Cardiac Rehab from 04/04/2020 in [facility] Cardiac and Pulmonary Rehab  Date 11/27/19  Educator [facility]  Instruction Review Code 1- Verbalizes Understanding      Other: -Provides group and verbal instruction on various topics (see comments)   Knowledge Questionnaire Score:  Knowledge Questionnaire Score - 11/27/19 1322      Knowledge Questionnaire Score   Pre Score 24/26 Education Focus: A&P, nutrition           Core Components/Risk Factors/Patient Goals at Admission:  Personal Goals and Risk Factors at Admission - 11/27/19 1323      Core Components/Risk Factors/Patient Goals  on Admission    Weight Management Yes;Weight Loss;Obesity    Intervention Weight Management: Develop a combined nutrition and exercise program designed to reach desired caloric intake, while maintaining appropriate intake of nutrient and fiber, sodium and fats, and appropriate energy expenditure required for the weight goal.;Weight Management/Obesity: Establish reasonable short term and long term weight goals.;Weight Management: Provide education and appropriate resources to help participant work on and attain dietary goals.;Obesity: Provide education and appropriate resources to help participant work on and attain dietary goals.    Admit Weight 228 lb 14.4 oz (103.8 kg)    Goal Weight: Short Term 223 lb (101.2 kg)    Goal Weight: Long Term 200 lb (90.7 kg)    Expected Outcomes Short Term:  Continue to assess and modify interventions until short term weight is achieved;Long Term: Adherence to nutrition and physical activity/exercise program aimed toward attainment of established weight goal;Weight Loss: Understanding of general recommendations for a balanced deficit meal plan, which promotes 1-2 lb weight loss per week and includes a negative energy balance of [phone] kcal/d;Understanding recommendations for meals to include 15-35% energy as protein, 25-35% energy from fat, 35-60% energy from carbohydrates, less than 239m of dietary cholesterol, 20-35 gm of total fiber daily;Understanding of distribution of calorie intake throughout the day with the consumption of 4-5 meals/snacks    Improve shortness of breath with ADL's Yes    Intervention Provide education, individualized exercise plan and daily activity instruction to help decrease symptoms of SOB with activities of daily living.    Expected Outcomes Short Term: Improve cardiorespiratory fitness to achieve a reduction of symptoms when performing ADLs;Long Term: Be able to perform more ADLs without symptoms or delay the onset of symptoms    Heart Failure Yes    Intervention Provide a combined exercise and nutrition program that is supplemented with education, support and counseling about heart failure. Directed toward relieving symptoms such as shortness of breath, decreased exercise tolerance, and extremity edema.    Expected Outcomes Improve functional capacity of life;Short term: Attendance in program 2-3 days a week with increased exercise capacity. Reported lower sodium intake. Reported increased fruit and vegetable intake. Reports medication compliance.;Short term: Daily weights obtained and reported for increase. Utilizing diuretic protocols set by physician.;Long term: Adoption of self-care skills and reduction of barriers for early signs and symptoms recognition and intervention leading to self-care maintenance.    Hypertension Yes     Intervention Provide education on lifestyle modifcations including regular physical activity/exercise, weight management, moderate sodium restriction and increased consumption of fresh fruit, vegetables, and low fat dairy, alcohol moderation, and smoking cessation.;Monitor prescription use compliance.    Expected Outcomes Short Term: Continued assessment and intervention until BP is < 140/966mHG in hypertensive participants. < 130/8086mG in hypertensive participants with diabetes, heart failure or chronic kidney disease.;Long Term: Maintenance of blood pressure at goal levels.    Lipids Yes    Intervention Provide education and support for participant on nutrition & aerobic/resistive exercise along with prescribed medications to achieve LDL <77m34mDL >40mg39m Expected Outcomes Short Term: Participant states understanding of desired cholesterol values and is compliant with medications prescribed. Participant is following exercise prescription and nutrition guidelines.;Long Term: Cholesterol controlled with medications as prescribed, with individualized exercise RX and with personalized nutrition plan. Value goals: LDL < 77mg,57m > 40 mg.           Education:Diabetes - Individual verbal and written instruction to review signs/symptoms of diabetes, desired ranges of  glucose level fasting, after meals and with exercise. Acknowledge that pre and post exercise glucose checks will be done for 3 sessions at entry of program.   Education: Know Your Numbers and Risk Factors: -Group verbal and written instruction about important numbers in your health.  Discussion of what are risk factors and how they play a role in the disease process.  Review of Cholesterol, Blood Pressure, Diabetes, and BMI and the role they play in your overall health.   Cardiac Rehab from 04/04/2020 in [facility] Cardiac and Pulmonary Rehab  Date 03/28/20  Educator SB  Instruction Review Code 1- Verbalizes Understanding      Core  Components/Risk Factors/Patient Goals Review:   Goals and Risk Factor Review    Row Name 01/02/20 1142 01/30/20 1121 03/21/20 1139         Core Components/Risk Factors/Patient Goals Review   Personal Goals Review Weight Management/Obesity;Lipids;Hypertension;Heart Failure;Improve shortness of breath with ADL's Weight Management/Obesity;Lipids;Hypertension;Heart Failure;Improve shortness of breath with ADL's Weight Management/Obesity;Lipids;Hypertension;Heart Failure;Improve shortness of breath with ADL's     Review Kevin Pearson is doing well back in rehab.  His weight is up to 231 lb this week.  He does weigh routinely at home and tracks it closely.  He also monitors his salt and fluid intake. He checks his pressure every other day at home and tracks it.  He also keeps an eye on his oxygen saturation and usually is in the upper 90s.  His breathing is doing okay and he is using his inhaler.  He still gets short of breath doing activity and really noticed it while trying to carry his dog in and out of the apartment. He still has some swelling in his ankles for his heart failure. Kevin Pearson continues to work on weight loss.  His pressures have been doing well and he denies any heart failure symptoms.  His biggest problem continues to be his breathing.  He continues to try to improve.  He feels better overall, but still wants to do more. Kevin Pearson continues to work on weight loss. His pressures have been doing well and he is having some fluid building up which gets better with compression socks; if it gets too bad he has medication at home recommended by doctor to use as needed sparingly.  His biggest problem continues to be his breathing.  He continues to try to improve.  He was feeling better, but since taking a break from rehab he has been worse - he is going back to the gym next week.     Expected Outcomes Short: Continue to work on weight loss Long: Continue to manage heart failure. Short: Continue to work on on weight  loss Long; Continue to monitor risk factors. Short: Continue to work on on weight loss Long; Continue to monitor risk factors.            Core Components/Risk Factors/Patient Goals at Discharge (Final Review):   Goals and Risk Factor Review - 03/21/20 1139      Core Components/Risk Factors/Patient Goals Review   Personal Goals Review Weight Management/Obesity;Lipids;Hypertension;Heart Failure;Improve shortness of breath with ADL's    Review Kevin Pearson continues to work on weight loss. His pressures have been doing well and he is having some fluid building up which gets better with compression socks; if it gets too bad he has medication at home recommended by doctor to use as needed sparingly.  His biggest problem continues to be his breathing.  He continues to try to improve.  He was  feeling better, but since taking a break from rehab he has been worse - he is going back to the gym next week.    Expected Outcomes Short: Continue to work on on weight loss Long; Continue to monitor risk factors.           ITP Comments:  ITP Comments    Row Name 11/22/19 1130 11/27/19 1256 12/20/19 0610 12/25/19 1428 01/17/20 0925   ITP Comments Virtual Orientation call completed with Kevin Pearson today. He has appt on 5/24 for the EP Eval and gym orientation. Diagnosis documentation can be found in [facility] 11/13/19 office visit Completed 6MWT and gym orientation.  Initial ITP created and sent for review to Dr. Emily Filbert, Medical Director. 30 Day review completed. Medical Director ITP review done, changes made as directed, and signed approval by Medical Director. Called to check on patient.  He was out with various appointments.  He plans to be here tomorrow. 30 Day review completed. Medical Director ITP review done, changes made as directed, and signed approval by Medical Director.   Row Name 02/14/20 [phone] 02/20/20 0922 03/07/20 1116 03/13/20 1525 04/02/20 1022   ITP Comments 30 Day review completed. Medical Director ITP  review done, changes made as directed, and signed approval by Medical Director. Kevin Pearson called yesterday to let us know that he may be out for the next couple of weeks with a family emergency. Called to check on patient.  He is still down in [place] and plans to return around 9/15. 30 day review completed. ITP sent to Dr. Emily Filbert, Medical Director of Cardiac and Pulmonary Rehab. Continue with ITP unless changes are made by physician. Patient called- thinks he has gout and/or neuropathy with his feet and is hard to ambulate. Patient will not be in rehab today. He is contacting his doctor and will let us know how he feels for the remaining of the week.   [place] Name 04/10/20 0539           ITP Comments 30 Day review completed. Medical Director ITP review done, changes made as directed, and signed approval by Medical Director.              Comments:

## 2020-04-16 ENCOUNTER — Encounter: Payer: Medicare Other | Attending: Cardiovascular Disease

## 2020-04-16 ENCOUNTER — Telehealth: Payer: Self-pay | Admitting: *Deleted

## 2020-04-16 ENCOUNTER — Encounter: Payer: Self-pay | Admitting: *Deleted

## 2020-04-16 DIAGNOSIS — I208 Other forms of angina pectoris: Secondary | ICD-10-CM | POA: Insufficient documentation

## 2020-04-16 DIAGNOSIS — I5022 Chronic systolic (congestive) heart failure: Secondary | ICD-10-CM | POA: Insufficient documentation
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
## 2020-04-23 ENCOUNTER — Other Ambulatory Visit: Payer: Self-pay

## 2020-04-23 ENCOUNTER — Encounter: Payer: Medicare Other | Admitting: *Deleted

## 2020-04-23 DIAGNOSIS — I208 Other forms of angina pectoris: Secondary | ICD-10-CM | POA: Diagnosis not present

## 2020-04-23 DIAGNOSIS — I5022 Chronic systolic (congestive) heart failure: Secondary | ICD-10-CM | POA: Diagnosis present

## 2020-04-23 NOTE — Progress Notes (Signed)
Daily Session Note  Patient Details  Name: Willet Schleifer MRN: [id number] Date of Birth: February 20, 1951 Referring Provider:     Cardiac Rehab from 11/27/2019 in [facility] Cardiac and Pulmonary Rehab  Referring Provider Ida Rogue MD      Encounter Date: 04/23/2020  Check In:  Session Check In - 04/23/20 1151      Check-In   Supervising physician immediately available to respond to emergencies See telemetry face sheet for immediately available ER MD    Location [facility]-Cardiac & Pulmonary Rehab    Staff Present Heath Lark, RN, BSN, CCRP;Melissa [place] RDN, LDN;Joseph [employer], [place], ACSM CEP, Exercise Physiologist    Virtual Visit No    Medication changes reported     No    Fall or balance concerns reported    No    Warm-up and Cool-down Performed on first and last piece of equipment    Resistance Training Performed Yes    VAD Patient? No    PAD/SET Patient? No      Pain Assessment   Currently in Pain? No/denies              Social History   Tobacco Use  Smoking Status Former Smoker  . Packs/day: 1.00  . Years: 40.00  . Pack years: 40.00  . Types: Cigarettes  . Quit date: 08/2015  . Years since quitting: 4.7  Smokeless Tobacco Never Used  Tobacco Comment   Quit 2017      Goals Met:  Independence with exercise equipment Exercise tolerated well No report of cardiac concerns or symptoms  Goals Unmet:  Not Applicable  Comments: Pt able to follow exercise prescription today without complaint.  Will continue to monitor for progression.    Dr. Emily Filbert is Medical Director for [place] and [name].

## 2020-04-25 ENCOUNTER — Other Ambulatory Visit: Payer: Self-pay

## 2020-04-25 ENCOUNTER — Encounter: Payer: Medicare Other | Admitting: *Deleted

## 2020-04-25 DIAGNOSIS — I5022 Chronic systolic (congestive) heart failure: Secondary | ICD-10-CM

## 2020-04-25 DIAGNOSIS — I208 Other forms of angina pectoris: Secondary | ICD-10-CM

## 2020-04-25 NOTE — Progress Notes (Signed)
Daily Session Note  Patient Details  Name: Kevin Pearson MRN: [id number] Date of Birth: Nov 30, 1950 Referring Provider:     Cardiac Rehab from 11/27/2019 in [facility] Cardiac and Pulmonary Rehab  Referring Provider Ida Rogue MD      Encounter Date: 04/25/2020  Check In:  Session Check In - 04/25/20 1130      Check-In   Supervising physician immediately available to respond to emergencies See telemetry face sheet for immediately available ER MD    Location [facility]-Cardiac & Pulmonary Rehab    Staff Present Hope Budds RDN, Luther Redo, MPA, RN;Jazell Rosenau Sherryll Burger, RN BSN;Joseph Lou Miner, MS Exercise Physiologist    Virtual Visit No    Medication changes reported     No    Fall or balance concerns reported    No    Warm-up and Cool-down Performed on first and last piece of equipment    Resistance Training Performed Yes    VAD Patient? No    PAD/SET Patient? No      Pain Assessment   Currently in Pain? No/denies              Social History   Tobacco Use  Smoking Status Former Smoker  . Packs/day: 1.00  . Years: 40.00  . Pack years: 40.00  . Types: Cigarettes  . Quit date: 08/2015  . Years since quitting: 4.7  Smokeless Tobacco Never Used  Tobacco Comment   Quit 2017      Goals Met:  Independence with exercise equipment Exercise tolerated well No report of cardiac concerns or symptoms Strength training completed today  Goals Unmet:  Not Applicable  Comments: Pt able to follow exercise prescription today without complaint.  Will continue to monitor for progression.    Dr. Emily Filbert is Medical Director for [place] and [name].

## 2020-04-30 ENCOUNTER — Encounter: Payer: Medicare Other | Admitting: *Deleted

## 2020-04-30 ENCOUNTER — Other Ambulatory Visit: Payer: Self-pay

## 2020-04-30 DIAGNOSIS — I5022 Chronic systolic (congestive) heart failure: Secondary | ICD-10-CM

## 2020-04-30 DIAGNOSIS — I208 Other forms of angina pectoris: Secondary | ICD-10-CM | POA: Diagnosis not present

## 2020-04-30 NOTE — Progress Notes (Signed)
Daily Session Note  Patient Details  Name: Kevin Pearson MRN: [id number] Date of Birth: [birth year] Referring Provider:     Cardiac Rehab from 11/27/2019 in [facility] Cardiac and Pulmonary Rehab  Referring Provider Ida Rogue MD      Encounter Date: 04/30/2020  Check In:  Session Check In - 04/30/20 1100      Check-In   Supervising physician immediately available to respond to emergencies See telemetry face sheet for immediately available ER MD    Location [facility]-Cardiac & Pulmonary Rehab    Staff Present Hope Budds RDN, LDN;Benjy Kana Sherryll Burger, RN BSN;Joseph Lou Miner, [place] Exercise Physiologist    Virtual Visit No    Medication changes reported     No    Fall or balance concerns reported    No    Warm-up and Cool-down Performed on first and last piece of equipment    Resistance Training Performed Yes    VAD Patient? No    PAD/SET Patient? No      Pain Assessment   Currently in Pain? No/denies              Social History   Tobacco Use  Smoking Status Former Smoker  . Packs/day: 1.00  . Years: 40.00  . Pack years: 40.00  . Types: Cigarettes  . Quit date: 08/2015  . Years since quitting: 4.7  Smokeless Tobacco Never Used  Tobacco Comment   Quit 2017      Goals Met:  Independence with exercise equipment Exercise tolerated well No report of cardiac concerns or symptoms Strength training completed today  Goals Unmet:  Not Applicable  Comments: Pt able to follow exercise prescription today without complaint.  Will continue to monitor for progression.    Dr. Emily Filbert is Medical Director for [place] and [name].

## 2020-05-02 ENCOUNTER — Other Ambulatory Visit: Payer: Self-pay

## 2020-05-02 DIAGNOSIS — I208 Other forms of angina pectoris: Secondary | ICD-10-CM | POA: Diagnosis not present

## 2020-05-02 DIAGNOSIS — I5022 Chronic systolic (congestive) heart failure: Secondary | ICD-10-CM

## 2020-05-02 NOTE — Progress Notes (Signed)
Daily Session Note  Patient Details  Name: Kevin Pearson MRN: [id number] Date of Birth: Jan 27, 1951 Referring Provider:     Cardiac Rehab from 11/27/2019 in [facility] Cardiac and Pulmonary Rehab  Referring Provider Ida Rogue MD      Encounter Date: 05/02/2020  Check In:  Session Check In - 05/02/20 1145      Check-In   Supervising physician immediately available to respond to emergencies See telemetry face sheet for immediately available ER MD    Location [facility]-Cardiac & Pulmonary Rehab    Staff Present Birdie Sons, MPA, RN;Melissa Caiola RDN, Rowe Pavy, BA, ACSM CEP, Exercise Physiologist;Kara Eliezer Bottom, MS Exercise Physiologist    Virtual Visit No    Medication changes reported     No    Fall or balance concerns reported    No    Warm-up and Cool-down Performed on first and last piece of equipment    Resistance Training Performed Yes    VAD Patient? No    PAD/SET Patient? No      Pain Assessment   Currently in Pain? No/denies              Social History   Tobacco Use  Smoking Status Former Smoker  . Packs/day: 1.00  . Years: 40.00  . Pack years: 40.00  . Types: Cigarettes  . Quit date: 08/2015  . Years since quitting: 4.7  Smokeless Tobacco Never Used  Tobacco Comment   Quit 2017      Goals Met:  Independence with exercise equipment Exercise tolerated well No report of cardiac concerns or symptoms Strength training completed today  Goals Unmet:  Not Applicable  Comments: Pt able to follow exercise prescription today without complaint.  Will continue to monitor for progression.    Dr. Emily Filbert is Medical Director for [place] and [name].

## 2020-05-07 ENCOUNTER — Encounter: Payer: Medicare Other | Attending: Cardiovascular Disease | Admitting: *Deleted

## 2020-05-07 ENCOUNTER — Other Ambulatory Visit: Payer: Self-pay

## 2020-05-07 DIAGNOSIS — I5022 Chronic systolic (congestive) heart failure: Secondary | ICD-10-CM | POA: Insufficient documentation

## 2020-05-07 DIAGNOSIS — I208 Other forms of angina pectoris: Secondary | ICD-10-CM | POA: Insufficient documentation

## 2020-05-07 NOTE — Progress Notes (Signed)
Daily Session Note  Patient Details  Name: Kevin Pearson MRN: [id number] Date of Birth: Jun 21, 1951 Referring Provider:     Cardiac Rehab from 11/27/2019 in [facility] Cardiac and Pulmonary Rehab  Referring Provider Ida Rogue MD      Encounter Date: 05/07/2020  Check In:  Session Check In - 05/07/20 1120      Check-In   Supervising physician immediately available to respond to emergencies See telemetry face sheet for immediately available ER MD    Location [facility]-Cardiac & Pulmonary Rehab    Staff Present Heath Lark, RN, BSN, CCRP;Joseph Foy [place], [place], ACSM CEP, Exercise Physiologist;Melissa Caiola RDN, LDN    Virtual Visit No    Medication changes reported     No    Fall or balance concerns reported    No    Warm-up and Cool-down Performed on first and last piece of equipment    Resistance Training Performed Yes    VAD Patient? No    PAD/SET Patient? No      Pain Assessment   Currently in Pain? No/denies               6 Minute Walk    Row Name 11/27/19 1257 05/07/20 1135       6 Minute Walk   Phase Initial Discharge    Distance 1045 feet 780 feet    Distance % Change -- -33.9 %    Distance Feet Change -- -265 ft    Walk Time 6 minutes 6 minutes    # of Rest Breaks -- 0    MPH 1.98 1.47    METS -- 1.55    RPE 13 14    Perceived Dyspnea  3 3    VO2 Peak 7.48 5.44    Symptoms Yes (comment) Yes (comment)    Comments SOB, neuropathy Hip pain 7/10, neuropathy in feet, plantar fascitis pain 5/10  Patient has had ongoing issues with severe pain in feet caused from neuropathy, plantar fascitis, and bunions that is currently being evaluted by podiatry    Resting HR 75 bpm 78 bpm    Resting BP 146/64 118/60    Resting Oxygen Saturation  99 % 98 %    Exercise Oxygen Saturation  during 6 min walk 99 % 96 %    Max Ex. HR 103 bpm 86 bpm    Max Ex. BP 126/64 128/66    2 Minute Post BP 126/64 --            Social History   Tobacco Use  Smoking  Status Former Smoker  . Packs/day: 1.00  . Years: 40.00  . Pack years: 40.00  . Types: Cigarettes  . Quit date: 08/2015  . Years since quitting: 4.7  Smokeless Tobacco Never Used  Tobacco Comment   Quit 2017      Goals Met:  Independence with exercise equipment Exercise tolerated well No report of cardiac concerns or symptoms  Goals Unmet:  Not Applicable  Comments: Pt able to follow exercise prescription today without complaint.  Will continue to monitor for progression.    Dr. Emily Filbert is Medical Director for [place] and [name].

## 2020-05-08 ENCOUNTER — Encounter: Payer: Self-pay | Admitting: *Deleted

## 2020-05-08 DIAGNOSIS — I208 Other forms of angina pectoris: Secondary | ICD-10-CM

## 2020-05-08 NOTE — Progress Notes (Signed)
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 other comorbidities.    Expected Outcomes Short Term Goal: Understand basic  principles of dietary content, such as calories, fat, sodium, cholesterol and nutrients.;Short Term Goal: A plan has been developed with personal nutrition goals set during dietitian appointment.;Long Term Goal: Adherence to prescribed nutrition plan.           Nutrition Assessments:  Nutrition Assessments - 05/02/20 1155      MEDFICTS Scores   Pre Score 61    Post Score 45    Score Difference -16           MEDIFICTS Score Key:          ?70 Need to make dietary changes          40-70 Heart Healthy Diet         ? 40 Therapeutic Level Cholesterol Diet  Nutrition Goals Re-Evaluation:  Nutrition  Goals Re-Evaluation    [place] Name 01/02/20 1142 01/30/20 1121 03/12/20 1009 03/21/20 1132 04/30/20 1301     Goals   Nutrition Goal ST: add in protein (hummus + and veggies) LT: weight to 200 lbs, improve lung capactiy, and strengthen heart, increase muscle mass Heart healthier eating Continue changes Pt is seeing an outside dietitian at this time. Pt is seeing an outside dietitian at this time.   Comment Continue to work on new changes set forth with Melissa last week. Mieczyslaw is meeting with a nutritionist on his own this afternoon Continue with current changes Pt is seeing an outside dietitian at this time. Pt is seeing an outside dietitian at this time.   Expected Outcome Short: Add in protein Long; Continue to work on weight loss. Short: Add in protein Long; Continue to work on weight loss. Continue changes Pt is seeing an outside dietitian at this time. Pt is seeing an outside dietitian at this time.          Nutrition Goals Discharge (Final Nutrition Goals Re-Evaluation):  Nutrition Goals Re-Evaluation - 04/30/20 1301      Goals   Nutrition Goal Pt is seeing an outside dietitian at this time.    Comment Pt is seeing an outside dietitian at this time.    Expected Outcome Pt is seeing an outside dietitian at this time.           Psychosocial: Target Goals: Acknowledge presence or absence of significant depression and/or stress, maximize coping skills, provide positive support system. Participant is able to verbalize types and ability to use techniques and skills needed for reducing stress and depression.   Education: Depression - Provides group verbal and written instruction on the correlation between heart/lung disease and depressed mood, treatment options, and the stigmas associated with seeking treatment.   Cardiac Rehab from 03/24/2018 in [facility] Cardiac and Pulmonary Rehab  Date 11/17/17  Educator [facility]  Instruction Review Code 1- [employer] Understanding      Education: Sleep  Hygiene -Provides group verbal and written instruction about how sleep can affect your health.  Define sleep hygiene, discuss sleep cycles and impact of sleep habits. Review good sleep hygiene tips.    Cardiac Rehab from 03/24/2018 in [facility] Cardiac and Pulmonary Rehab  Date 02/03/18  Educator [facility]  Instruction Review Code 1- Verbalizes Understanding       Education: Stress and Anxiety: - Provides group verbal and written instruction about the health risks of elevated stress and causes of high stress.  Discuss the correlation between heart/lung disease and anxiety and treatment options. Review healthy ways to manage with stress and anxiety.   Cardiac Rehab from 10/27/2016 in [facility] Cardiac and Pulmonary  Rehab  Date 08/13/16  Educator TS  Instruction Review Code (retired) 2- meets goals/outcomes       Initial Review & Psychosocial Screening:  Initial Psych Review & Screening - 11/22/19 1114      Initial Review   Current issues with History of Depression;Current Stress Concerns;Current Anxiety/Panic    Source of Stress Concerns Family    Comments Stress with his daughter.  Has chronic anxiety is controlled with meds      Family Dynamics   Good Support System? No    Concerns No support system    Comments Stress currently with his daughter. Ex Wife does not drive.    NO current support system      Barriers   Psychosocial barriers to participate in program There are no identifiable barriers or psychosocial needs.      Screening Interventions   Interventions Encouraged to exercise    Expected Outcomes Short Term goal: Utilizing psychosocial counselor, staff and physician to assist with identification of specific Stressors or current issues interfering with healing process. Setting desired goal for each stressor or current issue identified.;Long Term Goal: Stressors or current issues are controlled or eliminated.;Short Term goal: Identification and review with participant of any Quality of  Life or Depression concerns found by scoring the questionnaire.;Long Term goal: The participant improves quality of Life and PHQ9 Scores as seen by post scores and/or verbalization of changes           Quality of Life Scores:   Quality of Life - 05/02/20 1155      Quality of Life   Select Quality of Life      Quality of Life Scores   Health/Function Pre 15.53 %    Health/Function Post 11.2 %    Health/Function % Change -27.88 %    Socioeconomic Pre 19.5 %    Socioeconomic Post 24 %    Socioeconomic % Change  23.08 %    Psych/Spiritual Pre 24 %    Psych/Spiritual Post 18.79 %    Psych/Spiritual % Change -21.71 %    Family Pre 24.38 %    Family Post 18 %    Family % Change -26.17 %    GLOBAL Pre 19.23 %    GLOBAL Post 15.58 %    GLOBAL % Change -18.98 %          Scores of 19 and below usually indicate a poorer quality of life in these areas.  A difference of  2-3 points is a clinically meaningful difference.  A difference of 2-3 points in the total score of the Quality of Life Index has been associated with significant improvement in overall quality of life, self-image, physical symptoms, and general health in studies assessing change in quality of life.  PHQ-9: Recent Review Flowsheet Data    Depression screen [facility] 2/9 05/02/2020 12/26/2019 11/27/2019 03/15/2018 12/27/2017   Decreased Interest '2 1 2 1 ' 0   Down, Depressed, Hopeless '2 1 1 1 ' 0   PHQ - 2 Score '4 2 3 2 ' 0   Altered sleeping 1 0 1 1 -   Tired, decreased energy '2 1 2 1 ' -   Change in appetite 2 0 1 1 -   Feeling bad or failure about yourself  2 0 0 1 -   Trouble concentrating '2 1 1 1 ' -   Moving slowly or fidgety/restless 2 0 0 3 -   Suicidal thoughts 0 0 0 0 -   PHQ-9 Score 15 4  8 10 -   Difficult doing work/chores Very difficult Not difficult at all Somewhat difficult Somewhat difficult -     Interpretation of Total Score  Total Score Depression Severity:  1-4 = Minimal depression, 5-9 = Mild depression, 10-14  = Moderate depression, 15-19 = Moderately severe depression, 20-27 = Severe depression   Psychosocial Evaluation and Intervention:  Psychosocial Evaluation - 04/30/20 1259      Discharge Psychosocial Assessment & Intervention   Comments Aldean has done well in rehab.  He is excited about some of his upcoming doctor's appointments. He is hoping to get some relief from his hands and feet being so painful.  He is looking forward to graduating and feels like he is prepared to continue to manage his exercise independently.           Psychosocial Re-Evaluation:  Psychosocial Re-Evaluation    [place] Name 01/02/20 1137 01/30/20 1123 03/21/20 1134         Psychosocial Re-Evaluation   Current issues with Current Stress Concerns Current Stress Concerns Current Anxiety/Panic;Current Stress Concerns;Current Depression;History of Depression     Comments Mataio is doing well in rehab.  He thinks his gout has flared up again from extra fluid.  He continues to struggle with his relationship with his daughter but is able to talk to his son.  He is sleeping pretty good.  He is up late at night with mental disturbances from worring about his dog.  His dog got really sick last week and determined to be a diabetic.  He took him to the emergency vet for care.  Things are starting to calm down now and starting to improve.  He is 69 years old. Isay is doing well.    He is now giving his dog shots daily which neither of them like.  The dog is also having some incontenence issues now and needs to clean the floor.  He is doing what he can to make his dog comfortable.   He is sleeping well.  He just lost his friend yesterday and they are still not sure what really caused his death. Son has almost got hurt very badly and his dog has died recently which is causing him stress. No support, ask PCP about talking with a therapist. He is on medicaiton to help control his stress and depression. He reports sleeping well.     Expected  Outcomes Short: Continue to care for dog and find outlets for stress Long; Continue to work on Radiographer, therapeutic. Short; Continue to care for dog.  Long; Continue to use exercise for mental boost. ST: ask PCP about therapy, continue with medication LT: reduce stress, anxiety, and depression     Interventions Stress management education;Encouraged to attend Cardiac Rehabilitation for the exercise Stress management education;Encouraged to attend Cardiac Rehabilitation for the exercise Stress management education;Encouraged to attend Cardiac Rehabilitation for the exercise  advised talking with a therapist     Continue Psychosocial Services  Follow up required by staff Follow up required by staff Follow up required by staff     Comments -- -- He has chronic depression and anxiety controlled with medication. Recommended therapy as well       Initial Review   Source of Stress Concerns -- -- Family;Poor Coping Skills  recent loss of dog            Psychosocial Discharge (Final Psychosocial Re-Evaluation):  Psychosocial Re-Evaluation - 03/21/20 1134      Psychosocial Re-Evaluation   Current issues  with Current Anxiety/Panic;Current Stress Concerns;Current Depression;History of Depression    Comments Son has almost got hurt very badly and his dog has died recently which is causing him stress. No support, ask PCP about talking with a therapist. He is on medicaiton to help control his stress and depression. He reports sleeping well.    Expected Outcomes ST: ask PCP about therapy, continue with medication LT: reduce stress, anxiety, and depression    Interventions Stress management education;Encouraged to attend Cardiac Rehabilitation for the exercise   advised talking with a therapist   Continue Psychosocial Services  Follow up required by staff    Comments He has chronic depression and anxiety controlled with medication. Recommended therapy as well      Initial Review   Source of Stress Concerns  Family;Poor Coping Skills   recent loss of dog          Vocational Rehabilitation: Provide vocational rehab assistance to qualifying candidates.   Vocational Rehab Evaluation & Intervention:  Vocational Rehab - 11/22/19 1121      Initial Vocational Rehab Evaluation & Intervention   Assessment shows need for Vocational Rehabilitation No           Education: Education Goals: Education classes will be provided on a variety of topics geared toward better understanding of heart health and risk factor modification. Participant will state understanding/return demonstration of topics presented as noted by education test scores.  Learning Barriers/Preferences:   General Cardiac Education Topics:  AED/CPR: - Group verbal and written instruction with the use of models to demonstrate the basic use of the AED with the basic ABC's of resuscitation.   Cardiac Rehab from 03/24/2018 in [facility] Cardiac and Pulmonary Rehab  Date 03/22/18  Educator [place]  Instruction Review Code 1- Verbalizes Understanding      Anatomy & Physiology of the Heart: - Group verbal and written instruction and models provide basic cardiac anatomy and physiology, with the coronary electrical and arterial systems. Review of Valvular disease and Heart Failure   Cardiac Rehab from 05/02/2020 in [facility] Cardiac and Pulmonary Rehab  Date 05/02/20  Educator SB  Instruction Review Code 1- Verbalizes Understanding      Cardiac Procedures: - Group verbal and written instruction to review commonly prescribed medications for heart disease. Reviews the medication, class of the drug, and side effects. Includes the steps to properly store meds and maintain the prescription regimen. (beta blockers and nitrates)   Cardiac Rehab from 05/02/2020 in [facility] Cardiac and Pulmonary Rehab  Date 05/02/20  Educator SB  Instruction Review Code 1- Verbalizes Understanding      Cardiac Medications I: - Group verbal and written instruction to  review commonly prescribed medications for heart disease. Reviews the medication, class of the drug, and side effects. Includes the steps to properly store meds and maintain the prescription regimen.   Cardiac Rehab from 05/02/2020 in [facility] Cardiac and Pulmonary Rehab  Date 03/21/20  Educator SB  Instruction Review Code 1- Verbalizes Understanding      Cardiac Medications II: -Group verbal and written instruction to review commonly prescribed medications for heart disease. Reviews the medication, class of the drug, and side effects. (all other drug classes)   Cardiac Rehab from 05/02/2020 in [facility] Cardiac and Pulmonary Rehab  Date 03/28/20  Educator SB  Instruction Review Code 1- Verbalizes Understanding       Go Sex-Intimacy & Heart Disease, Get SMART - Goal Setting: - Group verbal and written instruction through game format to discuss heart disease  and the return to sexual intimacy. Provides group verbal and written material to discuss and apply goal setting through the application of the S.M.A.R.T. Method.   Cardiac Rehab from 05/02/2020 in [facility] Cardiac and Pulmonary Rehab  Date 05/02/20  Educator SB  Instruction Review Code 1- Verbalizes Understanding      Other Matters of the Heart: - Provides group verbal, written materials and models to describe Stable Angina and Peripheral Artery. Includes description of the disease process and treatment options available to the cardiac patient.   Cardiac Rehab from 03/24/2018 in [facility] Cardiac and Pulmonary Rehab  Date 02/24/18  Educator CE  Instruction Review Code 1- Verbalizes Understanding      Infection Prevention: - Provides verbal and written material to individual with discussion of infection control including proper hand washing and proper equipment cleaning during exercise session.   Cardiac Rehab from 05/02/2020 in [facility] Cardiac and Pulmonary Rehab  Date 11/27/19  Educator [facility]  Instruction Review Code 1- Verbalizes Understanding       Falls Prevention: - Provides verbal and written material to individual with discussion of falls prevention and safety.   Cardiac Rehab from 05/02/2020 in [facility] Cardiac and Pulmonary Rehab  Date 11/27/19  Educator [facility]  Instruction Review Code 1- Verbalizes Understanding      Other: -Provides group and verbal instruction on various topics (see comments)   Knowledge Questionnaire Score:  Knowledge Questionnaire Score - 05/02/20 1155      Knowledge Questionnaire Score   Pre Score 24/26 Education Focus: A&P, nutrition    Post Score 25/26           Core Components/Risk Factors/Patient Goals at Admission:  Personal Goals and Risk Factors at Admission - 11/27/19 1323      Core Components/Risk Factors/Patient Goals on Admission    Weight Management Yes;Weight Loss;Obesity    Intervention Weight Management: Develop a combined nutrition and exercise program designed to reach desired caloric intake, while maintaining appropriate intake of nutrient and fiber, sodium and fats, and appropriate energy expenditure required for the weight goal.;Weight Management/Obesity: Establish reasonable short term and long term weight goals.;Weight Management: Provide education and appropriate resources to help participant work on and attain dietary goals.;Obesity: Provide education and appropriate resources to help participant work on and attain dietary goals.    Admit Weight 228 lb 14.4 oz (103.8 kg)    Goal Weight: Short Term 223 lb (101.2 kg)    Goal Weight: Long Term 200 lb (90.7 kg)    Expected Outcomes Short Term: Continue to assess and modify interventions until short term weight is achieved;Long Term: Adherence to nutrition and physical activity/exercise program aimed toward attainment of established weight goal;Weight Loss: Understanding of general recommendations for a balanced deficit meal plan, which promotes 1-2 lb weight loss per week and includes a negative energy balance of [phone]  kcal/d;Understanding recommendations for meals to include 15-35% energy as protein, 25-35% energy from fat, 35-60% energy from carbohydrates, less than 249m of dietary cholesterol, 20-35 gm of total fiber daily;Understanding of distribution of calorie intake throughout the day with the consumption of 4-5 meals/snacks    Improve shortness of breath with ADL's Yes    Intervention Provide education, individualized exercise plan and daily activity instruction to help decrease symptoms of SOB with activities of daily living.    Expected Outcomes Short Term: Improve cardiorespiratory fitness to achieve a reduction of symptoms when performing ADLs;Long Term: Be able to perform more ADLs without symptoms or delay the onset of symptoms  Heart Failure Yes    Intervention Provide a combined exercise and nutrition program that is supplemented with education, support and counseling about heart failure. Directed toward relieving symptoms such as shortness of breath, decreased exercise tolerance, and extremity edema.    Expected Outcomes Improve functional capacity of life;Short term: Attendance in program 2-3 days a week with increased exercise capacity. Reported lower sodium intake. Reported increased fruit and vegetable intake. Reports medication compliance.;Short term: Daily weights obtained and reported for increase. Utilizing diuretic protocols set by physician.;Long term: Adoption of self-care skills and reduction of barriers for early signs and symptoms recognition and intervention leading to self-care maintenance.    Hypertension Yes    Intervention Provide education on lifestyle modifcations including regular physical activity/exercise, weight management, moderate sodium restriction and increased consumption of fresh fruit, vegetables, and low fat dairy, alcohol moderation, and smoking cessation.;Monitor prescription use compliance.    Expected Outcomes Short Term: Continued assessment and intervention until  BP is < 140/66m HG in hypertensive participants. < 130/826mHG in hypertensive participants with diabetes, heart failure or chronic kidney disease.;Long Term: Maintenance of blood pressure at goal levels.    Lipids Yes    Intervention Provide education and support for participant on nutrition & aerobic/resistive exercise along with prescribed medications to achieve LDL <7058mHDL >29m64m  Expected Outcomes Short Term: Participant states understanding of desired cholesterol values and is compliant with medications prescribed. Participant is following exercise prescription and nutrition guidelines.;Long Term: Cholesterol controlled with medications as prescribed, with individualized exercise RX and with personalized nutrition plan. Value goals: LDL < 70mg55mL > 40 mg.           Education:Diabetes - Individual verbal and written instruction to review signs/symptoms of diabetes, desired ranges of glucose level fasting, after meals and with exercise. Acknowledge that pre and post exercise glucose checks will be done for 3 sessions at entry of program.   Education: Know Your Numbers and Risk Factors: -Group verbal and written instruction about important numbers in your health.  Discussion of what are risk factors and how they play a role in the disease process.  Review of Cholesterol, Blood Pressure, Diabetes, and BMI and the role they play in your overall health.   [facility]  Date 03/28/20  Educator SB  Instruction Review Code 1- Verbalizes Understanding      Core Components/Risk Factors/Patient Goals Review:   Goals and Risk Factor Review    [place] Name 01/02/20 1142 01/30/20 1121 03/21/20 1139 04/30/20 1301       Core Components/Risk Factors/Patient Goals Review   Personal Goals Review Weight Management/Obesity;Lipids;Hypertension;Heart Failure;Improve shortness of breath with ADL's Weight Management/Obesity;Lipids;Hypertension;Heart  Failure;Improve shortness of breath with ADL's Weight Management/Obesity;Lipids;Hypertension;Heart Failure;Improve shortness of breath with ADL's Weight Management/Obesity;Lipids;Hypertension;Heart Failure;Improve shortness of breath with ADL's    Review CarmeJaymarioing well back in rehab.  His weight is up to 231 lb this week.  He does weigh routinely at home and tracks it closely.  He also monitors his salt and fluid intake. He checks his pressure every other day at home and tracks it.  He also keeps an eye on his oxygen saturation and usually is in the upper 90s.  His breathing is doing okay and he is using his inhaler.  He still gets short of breath doing activity and really noticed it while trying to carry his dog in and out of the apartment. He still has some swelling  in his ankles for his heart failure. [place] continues to work on weight loss.  His pressures have been doing well and he denies any heart failure symptoms.  His biggest problem continues to be his breathing.  He continues to try to improve.  He feels better overall, but still wants to do more. Abdulai continues to work on weight loss. His pressures have been doing well and he is having some fluid building up which gets better with compression socks; if it gets too bad he has medication at home recommended by doctor to use as needed sparingly.  His biggest problem continues to be his breathing.  He continues to try to improve.  He was feeling better, but since taking a break from rehab he has been worse - he is going back to the gym next week. [place] is nearing graduation. He continues to work on his weight loss.  His fluid is getting better and he is better at wearing his socks and plans to continue to do so.  His breathing has gotten better.  His pressures are doing well.    Expected Outcomes Short: Continue to work on weight loss Long: Continue to manage heart failure. Short: Continue to work on on weight loss Long; Continue to monitor risk  factors. Short: Continue to work on on weight loss Long; Continue to monitor risk factors. Continue tto monitor risk factors           Core Components/Risk Factors/Patient Goals at Discharge (Final Review):   Goals and Risk Factor Review - 04/30/20 1301      Core Components/Risk Factors/Patient Goals Review   Personal Goals Review Weight Management/Obesity;Lipids;Hypertension;Heart Failure;Improve shortness of breath with ADL's    Review Eligh is nearing graduation. He continues to work on his weight loss.  His fluid is getting better and he is better at wearing his socks and plans to continue to do so.  His breathing has gotten better.  His pressures are doing well.    Expected Outcomes Continue tto monitor risk factors           ITP Comments:  ITP Comments    Row Name 11/22/19 1130 11/27/19 1256 12/20/19 0610 12/25/19 1428 01/17/20 0925   ITP Comments Virtual Orientation call completed with Eitan today. He has appt on 5/24 for the EP Eval and gym orientation. Diagnosis documentation can be found in [facility] 11/13/19 office visit Completed 6MWT and gym orientation.  Initial ITP created and sent for review to Dr. Emily Filbert, Medical Director. 30 Day review completed. Medical Director ITP review done, changes made as directed, and signed approval by Medical Director. Called to check on patient.  He was out with various appointments.  He plans to be here tomorrow. 30 Day review completed. Medical Director ITP review done, changes made as directed, and signed approval by Medical Director.   Row Name 02/14/20 [phone] 02/20/20 0922 03/07/20 1116 03/13/20 1525 04/02/20 1022   ITP Comments 30 Day review completed. Medical Director ITP review done, changes made as directed, and signed approval by Medical Director. Geordie called yesterday to let us know that he may be out for the next couple of weeks with a family emergency. Called to check on patient.  He is still down in [place] and plans to return around  9/15. 30 day review completed. ITP sent to Dr. Emily Filbert, Medical Director of Cardiac and Pulmonary Rehab. Continue with ITP unless changes are made by physician. Patient called-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

## 2020-05-09 NOTE — Patient Instructions (Signed)
Discharge Patient Instructions  Patient Details  Name: Kevin Pearson MRN: [id number] Date of Birth: 1950-12-21 Referring Provider:  Antonieta Iba, MD   Number of Visits: 43  Reason for Discharge:  Patient reached a stable level of exercise. Patient independent in their exercise.  Smoking History:  Social History   Tobacco Use  Smoking Status Former Smoker  . Packs/day: 1.00  . Years: 40.00  . Pack years: 40.00  . Types: Cigarettes  . Quit date: 08/2015  . Years since quitting: 4.7  Smokeless Tobacco Never Used  Tobacco Comment   Quit 2017      Diagnosis:  Chronic stable angina (HCC)  Initial Exercise Prescription:  Initial Exercise Prescription - 11/27/19 1300      Date of Initial Exercise RX and Referring Provider   Date 11/27/19    Referring Provider Julien Nordmann MD      Treadmill   MPH 1.5    Grade 0.5    Minutes 15    METs 2.25      Recumbant Bike   Level 1    RPM 50    Watts 5    Minutes 15    METs 2      NuStep   Level 1    SPM 80    Minutes 15    METs 2      REL-XR   Level 1    Speed 50    Minutes 15    METs 2      Prescription Details   Frequency (times per week) 2    Duration Progress to 30 minutes of continuous aerobic without signs/symptoms of physical distress      Intensity   THRR 40-80% of Max Heartrate 106-137    Ratings of Perceived Exertion 11-13    Perceived Dyspnea 0-4      Progression   Progression Continue to progress workloads to maintain intensity without signs/symptoms of physical distress.      Resistance Training   Training Prescription Yes    Weight 3 lb    Reps 10-15           Discharge Exercise Prescription (Final Exercise Prescription Changes):  Exercise Prescription Changes - 05/01/20 1300      Response to Exercise   Blood Pressure (Admit) 110/60    Blood Pressure (Exercise) 120/58    Blood Pressure (Exit) 110/60    Heart Rate (Admit) 53 bpm    Heart Rate (Exercise) 86 bpm    Heart Rate  (Exit) 76 bpm    Rating of Perceived Exertion (Exercise) 13    Symptoms none    Duration Continue with 30 min of aerobic exercise without signs/symptoms of physical distress.    Intensity THRR unchanged      Progression   Progression Continue to progress workloads to maintain intensity without signs/symptoms of physical distress.    Average METs 2.7      Resistance Training   Training Prescription Yes    Weight 5 lb    Reps 10-15      Interval Training   Interval Training No      Recumbant Bike   Level 7    Minutes 15    METs 2.7           Functional Capacity:  6 Minute Walk    Row Name 11/27/19 1257 05/07/20 1135       6 Minute Walk   Phase Initial Discharge    Distance 1045 feet 780 feet  Distance % Change -- -33.9 %    Distance Feet Change -- -265 ft    Walk Time 6 minutes 6 minutes    # of Rest Breaks -- 0    MPH 1.98 1.47    METS -- 1.55    RPE 13 14    Perceived Dyspnea  3 3    VO2 Peak 7.48 5.44    Symptoms Yes (comment) Yes (comment)    Comments SOB, neuropathy Hip pain 7/10, neuropathy in feet, plantar fascitis pain 5/10  Patient has had ongoing issues with severe pain in feet caused from neuropathy, plantar fascitis, and bunions that is currently being evaluted by podiatry    Resting HR 75 bpm 78 bpm    Resting BP 146/64 118/60    Resting Oxygen Saturation  99 % 98 %    Exercise Oxygen Saturation  during 6 min walk 99 % 96 %    Max Ex. HR 103 bpm 86 bpm    Max Ex. BP 126/64 128/66    2 Minute Post BP 126/64 --           Quality of Life:  Quality of Life - 05/02/20 1155      Quality of Life   Select Quality of Life      Quality of Life Scores   Health/Function Pre 15.53 %    Health/Function Post 11.2 %    Health/Function % Change -27.88 %    Socioeconomic Pre 19.5 %    Socioeconomic Post 24 %    Socioeconomic % Change  23.08 %    Psych/Spiritual Pre 24 %    Psych/Spiritual Post 18.79 %    Psych/Spiritual % Change -21.71 %    Family  Pre 24.38 %    Family Post 18 %    Family % Change -26.17 %    GLOBAL Pre 19.23 %    GLOBAL Post 15.58 %    GLOBAL % Change -18.98 %           Nutrition:  Nutrition Therapy & Goals - 12/26/19 1128      Nutrition Therapy   Diet Heart healthy    Drug/Food Interactions Statins/Certain Fruits    Protein (specify units) 70-75g    Fiber 30 grams    Whole Grain Foods 3 servings    Saturated Fats 12 max. grams    Fruits and Vegetables 5 servings/day    Sodium 1.5 grams      Personal Nutrition Goals   Nutrition Goal ST: add in protein (hummus + and veggies) LT: weight to 200 lbs, improve lung capactiy, and strengthen heart, increase muscle mass    Comments B: bacon egg and cheese biscuit or eggs at home and have baked bacon (omelette) with english muffin or toast - coffee and no sugar, orange juice (takes pills) L: 2 tacos - eats lots of [place] food, ice water with lemon (sometimes beer with chips and salsa), or two chicken enchiladas ([place] - restaurant "[employer] [place]" or "[employer]"  D: Varies: chicken cattitorie (baked) - with fresh homemade sauce, and fresh vegetables, with some pasta (white). Still having dysphagia - his family got surgery. 3-4 diet pepsis and then water. His dysphagia depends on how mindful he is. Reviewed heart healthy eating. Pt reports having lots of vegetables and beans during dinner. Pt would like to increase protein.      Intervention Plan   Intervention Prescribe, educate and counsel regarding individualized specific dietary modifications aiming towards targeted core  components such as weight, hypertension, lipid management, diabetes, heart failure and other comorbidities.    Expected Outcomes Short Term Goal: Understand basic principles of dietary content, such as calories, fat, sodium, cholesterol and nutrients.;Short Term Goal: A plan has been developed with personal nutrition goals set during dietitian appointment.;Long Term Goal: Adherence to prescribed  nutrition plan.          Goals reviewed with patient; copy given to patient.

## 2020-05-10 ENCOUNTER — Ambulatory Visit (INDEPENDENT_AMBULATORY_CARE_PROVIDER_SITE_OTHER): Payer: Medicare Other

## 2020-05-10 DIAGNOSIS — I255 Ischemic cardiomyopathy: Secondary | ICD-10-CM

## 2020-05-10 LAB — CUP PACEART REMOTE DEVICE CHECK
Battery Remaining Longevity: 22 mo
Battery Remaining Percentage: 25 %
Battery Voltage: 2.81 V
Date Time Interrogation Session: 20211105081228
HighPow Impedance: 65 Ohm
HighPow Impedance: 65 Ohm
Implantable Lead Implant Date: 20131003
Implantable Lead Implant Date: 20131003
Implantable Lead Location: 753858
Implantable Lead Location: 753860
Implantable Pulse Generator Implant Date: 20131003
Lead Channel Impedance Value: 410 Ohm
Lead Channel Impedance Value: 840 Ohm
Lead Channel Pacing Threshold Amplitude: 0.625 V
Lead Channel Pacing Threshold Amplitude: 1.25 V
Lead Channel Pacing Threshold Pulse Width: 0.5 ms
Lead Channel Pacing Threshold Pulse Width: 0.6 ms
Lead Channel Sensing Intrinsic Amplitude: 5.9 mV
Lead Channel Setting Pacing Amplitude: 2 V
Lead Channel Setting Pacing Amplitude: 2.25 V
Lead Channel Setting Pacing Pulse Width: 0.5 ms
Lead Channel Setting Pacing Pulse Width: 0.6 ms
Lead Channel Setting Sensing Sensitivity: 0.5 mV
Pulse Gen Serial Number: 7055215

## 2020-05-13 NOTE — Progress Notes (Signed)
Remote ICD transmission.   

## 2020-05-14 ENCOUNTER — Emergency Department: Payer: Medicare Other

## 2020-05-14 ENCOUNTER — Other Ambulatory Visit: Payer: Self-pay

## 2020-05-14 ENCOUNTER — Encounter: Payer: Self-pay | Admitting: *Deleted

## 2020-05-14 ENCOUNTER — Emergency Department
Admission: EM | Admit: 2020-05-14 | Discharge: 2020-05-14 | Disposition: A | Discharge status: Home / Self Care | Payer: Medicare Other | Attending: Emergency Medicine | Admitting: Emergency Medicine

## 2020-05-14 DIAGNOSIS — R079 Chest pain, unspecified: Secondary | ICD-10-CM | POA: Diagnosis present

## 2020-05-14 DIAGNOSIS — J441 Chronic obstructive pulmonary disease with (acute) exacerbation: Secondary | ICD-10-CM | POA: Diagnosis not present

## 2020-05-14 DIAGNOSIS — I110 Hypertensive heart disease with heart failure: Secondary | ICD-10-CM | POA: Diagnosis not present

## 2020-05-14 DIAGNOSIS — Z7901 Long term (current) use of anticoagulants: Secondary | ICD-10-CM | POA: Insufficient documentation

## 2020-05-14 DIAGNOSIS — I25118 Atherosclerotic heart disease of native coronary artery with other forms of angina pectoris: Secondary | ICD-10-CM | POA: Diagnosis not present

## 2020-05-14 DIAGNOSIS — I5022 Chronic systolic (congestive) heart failure: Secondary | ICD-10-CM | POA: Diagnosis not present

## 2020-05-14 DIAGNOSIS — R0789 Other chest pain: Secondary | ICD-10-CM

## 2020-05-14 DIAGNOSIS — Z79899 Other long term (current) drug therapy: Secondary | ICD-10-CM | POA: Diagnosis not present

## 2020-05-14 DIAGNOSIS — Z87891 Personal history of nicotine dependence: Secondary | ICD-10-CM | POA: Diagnosis not present

## 2020-05-14 DIAGNOSIS — I208 Other forms of angina pectoris: Secondary | ICD-10-CM

## 2020-05-14 LAB — CBC
HCT: 38.1 % — ABNORMAL LOW (ref 39.0–52.0)
Hemoglobin: 12.8 g/dL — ABNORMAL LOW (ref 13.0–17.0)
MCH: 32.2 pg (ref 26.0–34.0)
MCHC: 33.6 g/dL (ref 30.0–36.0)
MCV: 96 fL (ref 80.0–100.0)
Platelets: 192 10*3/uL (ref 150–400)
RBC: 3.97 MIL/uL — ABNORMAL LOW (ref 4.22–5.81)
RDW: 13.2 % (ref 11.5–15.5)
WBC: 6.7 10*3/uL (ref 4.0–10.5)
nRBC: 0 % (ref 0.0–0.2)

## 2020-05-14 LAB — BASIC METABOLIC PANEL
Anion gap: 11 (ref 5–15)
BUN: 24 mg/dL — ABNORMAL HIGH (ref 8–23)
CO2: 24 mmol/L (ref 22–32)
Calcium: 9.3 mg/dL (ref 8.9–10.3)
Chloride: 102 mmol/L (ref 98–111)
Creatinine, Ser: 1.54 mg/dL — ABNORMAL HIGH (ref 0.61–1.24)
GFR, Estimated: 49 mL/min — ABNORMAL LOW (ref >60)
Glucose, Bld: 132 mg/dL — ABNORMAL HIGH (ref 70–99)
Potassium: 3.7 mmol/L (ref 3.5–5.1)
Sodium: 137 mmol/L (ref 135–145)

## 2020-05-14 LAB — TROPONIN I (HIGH SENSITIVITY)
Troponin I (High Sensitivity): 10 ng/L (ref <18)
Troponin I (High Sensitivity): 10 ng/L (ref <18)

## 2020-05-14 MED ORDER — METHOCARBAMOL 500 MG PO TABS: 500.0000 mg | ORAL_TABLET | Freq: Three times a day (TID) | ORAL | 0 refills | Status: AC | PRN

## 2020-05-14 MED ORDER — METHOCARBAMOL 500 MG PO TABS
500.0000 mg | ORAL_TABLET | Freq: Once | ORAL | Status: AC
  Administered 2020-05-14: 500 mg via ORAL
  Filled 2020-05-14: qty 1

## 2020-05-14 MED ORDER — ACETAMINOPHEN 500 MG PO TABS
1000.0000 mg | ORAL_TABLET | Freq: Once | ORAL | Status: AC
  Administered 2020-05-14: 1000 mg via ORAL
  Filled 2020-05-14: qty 2
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
## 2020-05-14 NOTE — Progress Notes (Unsigned)
Kevin Pearson came to program asking stating he is having chest pressure and SOB, which has been ongoing for a few days.  He asked what to do? We informed him that the best to do would be for [place] to escort him to the ED for evaluation and treatment.   He was taken to ED via transport chair with staff member with him until he was registered to be seen.

## 2020-05-14 NOTE — Discharge Instructions (Signed)
As we discussed, please follow-up with your cardiologist to discuss your continued symptoms.  Continue all of your medications at home. You are being discharged with a prescription for Robaxin this is a muscle relaxer that he can use as needed for your worsening back pain.  If you develop any significant worsening chest pain, particularly with passing out or pain that will not improve, please return to the ED.

## 2020-05-14 NOTE — ED Triage Notes (Signed)
Pt comes into the ED via wheelchair from the cardiac rehab center with c/o chest tightness with SOB for the past couple of days, pt is in NAD, speaking in complete sentences.

## 2020-05-16 NOTE — TOC Transition Note (Signed)
Transition of Care [facility]) - CM/SW Discharge Note   Patient Details  Name: Kevin Pearson MRN: [id number] Date of Birth: 07/26/1950  [facility]) CM/SW Contact:  Marina Goodell Phone Number: [phone] 05/16/2020, 2:25 PM   Clinical Narrative:     CSW spoke with patient and explained TOC role in patient care. Patient stated he is is able to perform daily ADLs but with some difficulty because he has trouble walking and uses a cane at home.  Patient stated he has a lot of stress due to financial difficulties and medical issues. Patient stated he does see his doctors regularly.  Patient stated he was open to home health.  CSW observed the patient to exhibit learned helplessness and the desire for someone take care of his needs for him.. Patient mentioned several times the inability to reach out to resources but when CSW gave him information for contacting resources or suggested other alternatives patient verbalized inability to try something new. Patient stated he does not have a close relationship with daughter or son and does not have friends in the area.  CSW gave patient some resources to contact for assistance and stated I would contact a home health agency in the area that may be able to accept him, but acceptance depended on staff availability and insurance coverage.  Patient verbalized understanding.  CSW was unable to set up home health before patient was d/c.  [facility] is unable to take patient because they do not have available [facility] RNs and cannot fulfill order from EDP.  CSW reached out to other home health agencies and is waiting for a reply.   Final next level of care: Home w Home Health Services Barriers to Discharge: Family Issues, Inadequate or no insurance   Patient Goals and CMS Choice Patient states their goals for this hospitalization and ongoing recovery are:: "I have al ot going on and I need help with everything.' CMS Medicare.gov  Compare Post Acute Care list provided to:: Patient Choice offered to / list presented to : Patient  Discharge Placement                       Discharge Plan and Services In-house Referral: Clinical Social Work   Post Acute Care Choice: Home Health                    HH Arranged: PT, RN, OT, Refused SNF, PCS/Personal Care Services [facility] Agency: [facility] Date [facility] Agency Contacted: 05/16/20 Time HH Agency Contacted: 1407 Representative spoke with at [facility] Agency: Milbert Coulter [phone]  Social Determinants of Health (SDOH) Interventions     Readmission Risk Interventions No flowsheet data found.

## 2020-05-20 NOTE — Progress Notes (Signed)
Office Visit    Patient Name: Kevin EngCarmen Curtner Date of Encounter: 05/21/2020  Primary Care Provider:  Olena LeatherwoodFarrug, Eugene D, FNP Primary Cardiologist:  Julien Nordmannimothy Gollan, MD Electrophysiologist:  None   Chief Complaint    Kevin Pearson is a 69 y.o. male with a hx of ischemic cardiomyopathy, ICD, GERD, anxiety, atrial fibrillation, depression, COPD on chronic O2, former tobacco use, HLD, hepatitis C, OSA without CPAP, lymphedema presents today for follow-up after ED visit for chest pain  Past Medical History    Past Medical History:  Diagnosis Date  . Acid reflux   . Anxiety   . Arthritis   . Atrial fibrillation (HCC)   . CHF (congestive heart failure) (HCC)   . Chronic orthostatic hypotension   . Clotting disorder (HCC)   . COPD (chronic obstructive pulmonary disease) (HCC)   . Depression   . Elevated PSA   . Heart attack (HCC)   . Heart disease   . Heart failure (HCC)   . Hepatitis   . High cholesterol   . Hypertension   . Sleep apnea   . TBI (traumatic brain injury) (HCC)   . Urinary retention    Past Surgical History:  Procedure Laterality Date  . BLADDER SURGERY    . cardiac stents    . CYSTOSCOPY WITH URETHRAL DILATATION N/A 06/25/2016   Procedure: CYSTOSCOPY WITH URETHRAL DILATATION;  Surgeon: Vanna ScotlandAshley Brandon, MD;  Location: [facility] ORS;  Service: Urology;  Laterality: N/A;  . EP IMPLANTABLE DEVICE     [employer] Jude BiV-ICD  . ESOPHAGOGASTRODUODENOSCOPY (EGD) WITH PROPOFOL N/A 04/06/2019   Procedure: ESOPHAGOGASTRODUODENOSCOPY (EGD) WITH PROPOFOL;  Surgeon: Wyline MoodAnna, Kiran, MD;  [facility];  Service: Gastroenterology;  Laterality: N/A;  . GREEN LIGHT LASER TURP (TRANSURETHRAL RESECTION OF PROSTATE  2016   done in [place]   . LEFT HEART CATH AND CORONARY ANGIOGRAPHY Left 10/27/2017   Procedure: LEFT HEART CATH AND CORONARY ANGIOGRAPHY;  Surgeon: Antonieta IbaGollan, Timothy J, MD;  Location: [facility] INVASIVE CV LAB;  Service: Cardiovascular;  Laterality: Left;  . RIGHT/LEFT HEART CATH AND  CORONARY ANGIOGRAPHY N/A 11/16/2019   Procedure: RIGHT/LEFT HEART CATH AND CORONARY ANGIOGRAPHY;  Surgeon: Antonieta IbaGollan, Timothy J, MD;  Location: [facility] INV[facility]IVE CV LAB;  Service: Cardiovascular;  Laterality: N/A;    Allergies  Allergies  Allergen Reactions  . Mangifera Indica Anaphylaxis  . Other Anaphylaxis, Itching and Other (See Comments)    Mango skin  . Keflex [Cephalexin] Nausea Only  . Codeine Nausea And Vomiting    History of Present Illness    Kevin EngCarmen Bachar is a 69 y.o. male with a hx of  ischemic cardiomyopathy, ICD, GERD, anxiety, atrial fibrillation, depression, COPD on chronic O2, former tobacco use, HLD, hepatitis C, OSA without CPAP, lymphedema.  He was last seen in clinic 01/10/2020.  Echocardiogram 03/2019 with LVEF 45 to 50%, mild LVH, RV normal size and function, LA moderately dilated, RA mildly dilated, mild MR/TR, mildly elevated PASP.   Seen by Dr. Graciela HusbandsKlein 10/2019 with recommendation for Myoview scanning due to increased burden of ventricular ectopy.  His ventricular pacing rate was increased to try to suppress some of the ventricular ectopy.  He underwent subsequent catheterization.   He underwent cardiac catheterization 11/16/2019 showing diffuse heavily calcified vessels with patent stent to the RCA and LAD.  Known CTO of mid LCX with collaterals.  Mild to moderate in-stent restenosis of the RCA/LAD.  He was recommended for medical management.  Right heart pressures no evidence of significant pulmonary hypertension.  LVEF  35-45% by visual estimate.  He was seen in follow-up 01/10/2020 and participating cardiac rehab with some improvement in his DOE.  With wearing compression stockings with good control of his lower extremity edema.  He did note waking up in the middle of the night with chest discomfort and wondered if his PPM was malfunctioning though on manual transmission no episodes noted.  He was seen in the ED 05/14/2020 with chest pain.  He noted multiple stressful  circumstances leading to anxiety and associated chest tightness for a few days.  EKG was at his baseline and HS-troponinx2 negative.   He presents today for follow-up after his ED visit.  He reports a lot of stress related to family, living situation, income, multiple health problems, following with multiple specialists.  Somewhat difficult historian as he spent majority of our visit describing health problems from a number of years ago.  He moved to the area to be closer to his daughter though is somewhat frustrated with her influence on his medical care.  Shares with me that he lives on $1300 per month and has had difficulty affording his co-pays.  He does live in some sort of housing that he has to make less than a certain dollar amount but tells me due to his recent health difficulties he has not kept up his apartment as he normally does and this has caused problems with his housing.  He is working with PT at [facility] and has what sounds like charity care in the process.  He is frustrated after his visit with the social worker in the ED as he was somewhat offended that they suggested that he would benefit from ALF. He does endorse needing assistance with housework and ADLs. SW in ED evaluated but [facility] was unable to provide resources due to staffing difficulties.   He attributes most of his chest pain to stress.  Tells me the chest pain occurs when he is under stress and is not exacerbated by activity.  He has not used nitroglycerin as he is "scared of it". Offered my reassurance and he verbalizes understanding of how to take nitroglycerin if needed.  His weight is down 11 pounds since last seen 4 months ago.  Denies shortness of breath and tells me dyspnea on exertion is stable at baseline.  He continues to participate in cardiac rehab and is close to completing.  He plans to exercise at [employer] upon graduating from cardiac rehab and requests a letter stating that he is allowed to  participate in regular cardiovascular exercise which we will provide.  EKGs/Labs/Other Studies Reviewed:   The following studies were reviewed today:  [facility] 11/2019  Prox LAD lesion is 30% stenosed.  Mid Cx lesion is 100% stenosed.  Prox Cx to Mid Cx lesion is 40% stenosed.  Mid RCA lesion is 40% stenosed.  Mid RCA to Dist RCA lesion is 40% stenosed.  Prox RCA lesion is 10% stenosed.  The left ventricular ejection fraction is 35-45% by visual estimate.  LV end diastolic pressure is normal.  There is moderate left ventricular systolic dysfunction.  LV end diastolic pressure is normal.     Left mainstem: Large vessel that bifurcates into the LAD and left circumflex, no significant disease noted  Left anterior descending (LAD): Large vessel that extends to the apical region, diagonal branch 2 of moderate size, heavily diffusely calcified vessel, proximal LAD stent is patent with 30 to 40% in-stent restenosis  Left circumflex (LCx): Large vessel with OM  branch 2, moderate mid disease after takeoff of OM1 50%, occluded mid to distal left circumflex with collaterals from right to left left to left  Right coronary artery (RCA): Right dominant vessel with PL and PDA, numerous stents from proximal to distal RCA are patent, mild to moderate mid in-stent restenosis, moderate distal in-stent restenosis  Left ventriculography: Left ventricular systolic function is moderately reduced, LVEF is estimated at 40% , there is no significant mitral regurgitation , no significant aortic valve stenosis -- inferior wall hypokinesis  Right heart pressures RA mean of 2 RV 26/-1/2 PA 26/10 mean of 17 wedge mean of 8 LV 123/4/10 AO 130/68 mean 92   Final Conclusions:  Chronic stable angina Diffuse heavily calcified vessels,  patent stents in the RCA and LAD Known chronically occluded mid left circumflex with collaterals Mild up to moderate in-stent restenosis RCA, LAD Medical management  recommended Mild progression compared to prior study --Right heart pressures indicating no significant pulmonary hypertension  Recommendations:  Lifestyle modification, he has been losing weight No smoking, aggressive cholesterol management Stay on aspirin Plavix   EKG:  EKG is ordered today.  The ekg ordered today demonstrates ventricular paced rhythm 76 bpm.   Recent Labs: 11/13/2019: Magnesium 1.5 05/14/2020: BUN 24; Creatinine, Ser 1.54; Hemoglobin 12.8; Platelets 192; Potassium 3.7; Sodium 137  Recent Lipid Panel    Component Value Date/Time   CHOL 114 11/23/2017 1007   TRIG 119 11/23/2017 1007   HDL 30 (L) 11/23/2017 1007   CHOLHDL 3.8 11/23/2017 1007   LDLCALC 60 11/23/2017 1007    Home Medications   Current Meds  Medication Sig  . acetaminophen (TYLENOL) 325 MG tablet Take 650 mg by mouth every 6 (six) hours as needed for mild pain or headache.   . albuterol (VENTOLIN HFA) 108 (90 Base) MCG/ACT inhaler Inhale 2 puffs into the lungs every 6 (six) hours as needed for wheezing or shortness of breath.  . busPIRone (BUSPAR) 10 MG tablet Take 10 mg by mouth 3 (three) times daily.  . Carboxymethylcell-Glycerin PF (REFRESH OPTIVE PF) 0.5-0.9 % SOLN Place 1 drop into both eyes daily as needed (Dry eye).  Marland Kitchen clopidogrel (PLAVIX) 75 MG tablet Take 75 mg by mouth daily.  . divalproex (DEPAKOTE) 250 MG DR tablet Take 750 mg by mouth 2 (two) times daily.   Marland Kitchen gabapentin (NEURONTIN) 300 MG capsule Take 600 mg by mouth 2 (two) times daily.   Marland Kitchen losartan (COZAAR) 25 MG tablet Take 25 mg by mouth daily.  . Magnesium Oxide 400 MG CAPS Take 1 capsule (400 mg total) by mouth daily.  . Melatonin 5 MG TABS Take 5 mg by mouth at bedtime.   . methocarbamol (ROBAXIN) 500 MG tablet Take 1 tablet (500 mg total) by mouth every 8 (eight) hours as needed for muscle spasms.  . metoprolol succinate (TOPROL-XL) 50 MG 24 hr tablet TAKE 1 TABLET BY MOUTH EVERY DAY WITH OR IMMEDIATELY AFTER MEAL (Patient taking  differently: Take 50 mg by mouth daily. OR IMMEDIATELY AFTER MEAL)  . Multiple Vitamin (MULTIVITAMIN) tablet Take 1 tablet by mouth daily.  . nitroGLYCERIN (NITROSTAT) 0.4 MG SL tablet Place 1 tablet (0.4 mg total) under the tongue every 5 (five) minutes as needed for chest pain.  Marland Kitchen omeprazole (PRILOSEC) 40 MG capsule Take 1 capsule (40 mg total) by mouth 2 (two) times daily.  . polyethylene glycol powder (GLYCOLAX/MIRALAX) 17 GM/SCOOP powder Take by mouth daily.  . rivaroxaban (XARELTO) 20 MG TABS tablet Take 20 mg  by mouth daily with supper.   . rosuvastatin (CRESTOR) 20 MG tablet Take 20 mg by mouth at bedtime.   . senna (SENOKOT) 8.6 MG tablet Take 2 tablets by mouth 2 (two) times daily.   . tamsulosin (FLOMAX) 0.4 MG CAPS capsule TAKE ONE CAPSULE BY MOUTH EVERY DAY AFTER SUPPER (Patient taking differently: Take 0.4 mg by mouth daily. )  . torsemide (DEMADEX) 20 MG tablet Take 40 mg by mouth 2 (two) times daily.  . traMADol (ULTRAM) 50 MG tablet Take 50 mg by mouth every 6 (six) hours as needed.  . traZODone (DESYREL) 100 MG tablet 100 mg at bedtime.     Review of Systems   Review of Systems  Constitutional: Negative for chills, fever and malaise/fatigue.  Cardiovascular: Positive for chest pain and dyspnea on exertion. Negative for irregular heartbeat, leg swelling, near-syncope, orthopnea, palpitations and syncope.  Respiratory: Negative for cough, shortness of breath and wheezing.   Musculoskeletal: Positive for back pain.       (+) bilateral feet pain  Gastrointestinal: Negative for melena, nausea and vomiting.  Genitourinary: Negative for hematuria.  Neurological: Negative for dizziness, light-headedness and weakness.   All other systems reviewed and are otherwise negative except as noted above.  Physical Exam    VS:  BP 104/68 (BP Location: Left Arm, Patient Position: Sitting, Cuff Size: Normal)   Pulse 76   Ht 5' 7.5" (1.715 m)   Wt 221 lb 12.8 oz (100.6 kg)   SpO2 98%    BMI 34.23 kg/m  , BMI Body mass index is 34.23 kg/m.  Wt Readings from Last 3 Encounters:  05/21/20 221 lb 12.8 oz (100.6 kg)  05/14/20 220 lb (99.8 kg)  01/10/20 232 lb 4 oz (105.3 kg)     GEN: Well nourished, well developed, in no acute distress. HEENT: normal. Neck: Supple, no JVD, carotid bruits, or masses. Cardiac: RRR, no murmurs, rubs, or gallops. No clubbing, cyanosis, edema.  Radials/DP/PT 2+ and equal bilaterally.  Respiratory:  Respirations regular and unlabored, clear to auscultation bilaterally. GI: Soft, nontender, nondistended. MS: No deformity or atrophy. Skin: Warm and dry, no rash. Neuro:  Strength and sensation are intact. Psych: Normal affect.  Assessment & Plan    1. Chronic systolic heart failure/ischemic cardiomyopathy- Euvolemic and well compensated on exam.  GDMT includes losartan, Toprol, Crestor, torsemide.  Low-sodium, healthy diet encouraged.  Encouraged to continue participation in cardiac rehab.  Provided letter for his [employer] location that states that he may participate in regular cardiovascular exercise.  Also gave him information for [facility] and encouraged him to call.  2. CAD-EKG today with no acute ST/T wave changes.  Recent ED visit 05/14/2020 with HS-troponinx2 unremarkable.  GDMT includes Toprol, statin, as needed nitroglycerin.  He has not used as needed nitroglycerin and we reviewed the use and indications and he verbalized understanding.  His chest pain is triggered by stress and not associated with exertion.  He had [facility] 11/2019 with stable coronary disease.  As such, defer repeat ischemic evaluation this time.  Will defer addition of Imdur due to low normal blood pressure and concern regarding multiple medications and cost today.  3. Financial difficulty / Stress -multiple psychosocial situations causing him stress, detailed above.  Minuses regarding multiple co-pays, multiple medications and multiple specialists, his apartment.   Provided patient assistance paperwork for [place] and encouraged him to fill out.  Discussed that social worker in the ED discussed with [facility] that they were unable  to provide services due to lack of staffing.  We will ask our heart and vascular social worker team to reach out to Mr. Jarchow if they have suggestions for additional resources. Encouraged him to discuss charity care with his [facility] Providers as well as HH orders may be best obtained through primary care.   4. Permanent atrial fibrillation/chronic anticoagulation- Denies bleeding complications.  Continue Xarelto 20 mg daily.  5. S/p PPM-continue to follow with Dr. Graciela Husbands.  Device check 05/06/2020 without abnormality.  6. COPD-continue to follow with primary care and pulmonology.  No signs of acute exacerbation.  7. HLD, LDL goal less than 70-continue rosuvastatin 5 mg daily.  8. CKD-careful titration of diuretics and antihypertensives  Disposition: Follow up In January with Dr. Mariah Milling or APP.  Follow-up April 2022 with Dr. Graciela Husbands.  Signed, Alver Sorrow, NP 05/21/2020, 10:49 AM [facility]

## 2020-05-21 ENCOUNTER — Other Ambulatory Visit: Payer: Self-pay

## 2020-05-21 ENCOUNTER — Ambulatory Visit (INDEPENDENT_AMBULATORY_CARE_PROVIDER_SITE_OTHER): Payer: Medicare Other | Admitting: Family

## 2020-05-21 ENCOUNTER — Encounter: Payer: Self-pay | Admitting: Family

## 2020-05-21 VITALS — BP 104/68 | HR 76 | Ht 67.5 in | Wt 221.8 lb

## 2020-05-21 DIAGNOSIS — I255 Ischemic cardiomyopathy: Secondary | ICD-10-CM

## 2020-05-21 DIAGNOSIS — Z7901 Long term (current) use of anticoagulants: Secondary | ICD-10-CM

## 2020-05-21 DIAGNOSIS — I25118 Atherosclerotic heart disease of native coronary artery with other forms of angina pectoris: Secondary | ICD-10-CM

## 2020-05-21 DIAGNOSIS — E785 Hyperlipidemia, unspecified: Secondary | ICD-10-CM

## 2020-05-21 DIAGNOSIS — Z599 Problem related to housing and economic circumstances, unspecified: Secondary | ICD-10-CM

## 2020-05-21 DIAGNOSIS — I4821 Permanent atrial fibrillation: Secondary | ICD-10-CM

## 2020-05-21 DIAGNOSIS — F439 Reaction to severe stress, unspecified: Secondary | ICD-10-CM

## 2020-05-21 NOTE — Patient Instructions (Addendum)
Medication Instructions:  No medication changes today.   Use your nitroglycerin as needed for chest pain.  *If you need a refill on your cardiac medications before your next appointment, please call your pharmacy*  Lab Work: None ordered today.   Testing/Procedures: Your EKG today shows your PPM is functioning appropriately.   Follow-Up: At [facility], you and your health needs are our priority.  As part of our continuing mission to provide you with exceptional heart care, we have created designated Provider Care Teams.  These Care Teams include your primary Cardiologist (physician) and Advanced Practice Providers (APPs -  Physician Assistants and Nurse Practitioners) who all work together to provide you with the care you need, when you need it.  We recommend signing up for the patient portal called "MyChart".  Sign up information is provided on this After Visit Summary.  MyChart is used to connect with patients for Virtual Visits (Telemedicine).  Patients are able to view lab/test results, encounter notes, upcoming appointments, etc.  Non-urgent messages can be sent to your provider as well.   To learn more about what you can do with MyChart, go to [email or web address].    Your next appointment:   In January with Dr. Mariah Milling and in April with Dr. Graciela Husbands  Other Instructions  We have provided you with the paperwork for [facility] Patient Financial Assistance. Please fill out and mail to the address provided. Their phone number is [phone].  [facility] was unable to provide home health due to lack of staff. We have reached out to our Heart and Vascular Social Worker to see what additional resources we can assist with. Please expect a phone call from them - it will be either [place] or Annice Pih who calls you.   Blake Divine, [facility] [employer] is an option for exercise if you wish. We have provided you a note to permit an exercise program. It is staffed by exercise  specialists. Their phone number is [phone].

## 2020-05-23 ENCOUNTER — Other Ambulatory Visit: Payer: Self-pay

## 2020-05-23 ENCOUNTER — Telehealth: Payer: Self-pay | Admitting: Licensed Clinical Social Worker

## 2020-05-23 DIAGNOSIS — I5022 Chronic systolic (congestive) heart failure: Secondary | ICD-10-CM

## 2020-05-23 DIAGNOSIS — I208 Other forms of angina pectoris: Secondary | ICD-10-CM | POA: Diagnosis not present

## 2020-05-23 NOTE — Progress Notes (Signed)
Discharge Progress Report  Patient Details  Name: Kevin Pearson MRN: [id number] Date of Birth: April 11, 1951 Referring Provider:     Cardiac Rehab from 11/27/2019 in [facility]  Referring Provider Ida Rogue MD       Number of Visits: 36  Reason for Discharge:  Patient reached a stable level of exercise. Patient independent in their exercise. Patient has met program and personal goals.  Smoking History:  Social History   Tobacco Use  Smoking Status Former Smoker  . Packs/day: 1.00  . Years: 40.00  . Pack years: 40.00  . Types: Cigarettes  . Quit date: 08/2015  . Years since quitting: 4.7  Smokeless Tobacco Never Used  Tobacco Comment   Quit 2017      Diagnosis:  Chronic stable angina (HCC)  Heart failure, chronic systolic ([place])  ADL UCSD:   Initial Exercise Prescription:  Initial Exercise Prescription - 11/27/19 1300      Date of Initial Exercise RX and Referring Provider   Date 11/27/19    Referring Provider Ida Rogue MD      Treadmill   MPH 1.5    Grade 0.5    Minutes 15    METs 2.25      Recumbant Bike   Level 1    RPM 50    Watts 5    Minutes 15    METs 2      NuStep   Level 1    SPM 80    Minutes 15    METs 2      REL-XR   Level 1    Speed 50    Minutes 15    METs 2      Prescription Details   Frequency (times per week) 2    Duration Progress to 30 minutes of continuous aerobic without signs/symptoms of physical distress      Intensity   THRR 40-80% of Max Heartrate 106-137    Ratings of Perceived Exertion 11-13    Perceived Dyspnea 0-4      Progression   Progression Continue to progress workloads to maintain intensity without signs/symptoms of physical distress.      Resistance Training   Training Prescription Yes    Weight 3 lb    Reps 10-15           Discharge Exercise Prescription (Final Exercise Prescription Changes):  Exercise Prescription Changes - 05/14/20 1400      Response to  Exercise   Blood Pressure (Admit) 118/60    Blood Pressure (Exercise) 126/64    Blood Pressure (Exit) 98/62    Heart Rate (Admit) 72 bpm    Heart Rate (Exercise) 89 bpm    Heart Rate (Exit) 83 bpm    Rating of Perceived Exertion (Exercise) 13    Symptoms none    Duration Continue with 30 min of aerobic exercise without signs/symptoms of physical distress.    Intensity THRR unchanged      Progression   Progression Continue to progress workloads to maintain intensity without signs/symptoms of physical distress.    Average METs 2.7      Resistance Training   Training Prescription Yes    Weight 5 lb    Reps 10-15      Interval Training   Interval Training No      REL-XR   Level 8    Minutes 20      Home Exercise Plan   Plans to continue exercise at [facility] (comment)  [employer]   Frequency Add 2 additional days to program exercise sessions.    Initial Home Exercises Provided 12/25/19           Functional Capacity:  6 Minute Walk    Row Name 11/27/19 1257 05/07/20 1135       6 Minute Walk   Phase Initial Discharge    Distance 1045 feet 780 feet    Distance % Change -- -33.9 %    Distance Feet Change -- -265 ft    Walk Time 6 minutes 6 minutes    # of Rest Breaks -- 0    MPH 1.98 1.47    METS -- 1.55    RPE 13 14    Perceived Dyspnea  3 3    VO2 Peak 7.48 5.44    Symptoms Yes (comment) Yes (comment)    Comments SOB, neuropathy Hip pain 7/10, neuropathy in feet, plantar fascitis pain 5/10  Patient has had ongoing issues with severe pain in feet caused from neuropathy, plantar fascitis, and bunions that is currently being evaluted by podiatry    Resting HR 75 bpm 78 bpm    Resting BP 146/64 118/60    Resting Oxygen Saturation  99 % 98 %    Exercise Oxygen Saturation  during 6 min walk 99 % 96 %    Max Ex. HR 103 bpm 86 bpm    Max Ex. BP 126/64 128/66    2 Minute Post BP 126/64 --           Psychological, QOL, Others - Outcomes: PHQ  2/9: Depression screen [facility] 2/9 05/02/2020 12/26/2019 11/27/2019 03/15/2018 12/27/2017  Decreased Interest '2 1 2 1 ' 0  Down, Depressed, Hopeless '2 1 1 1 ' 0  PHQ - 2 Score '4 2 3 2 ' 0  Altered sleeping 1 0 1 1 -  Tired, decreased energy '2 1 2 1 ' -  Change in appetite 2 0 1 1 -  Feeling bad or failure about yourself  2 0 0 1 -  Trouble concentrating '2 1 1 1 ' -  Moving slowly or fidgety/restless 2 0 0 3 -  Suicidal thoughts 0 0 0 0 -  PHQ-9 Score '15 4 8 10 ' -  Difficult doing work/chores Very difficult Not difficult at all Somewhat difficult Somewhat difficult -  Some recent data might be hidden    Quality of Life:  Quality of Life - 05/02/20 1155      Quality of Life   Select Quality of Life      Quality of Life Scores   Health/Function Pre 15.53 %    Health/Function Post 11.2 %    Health/Function % Change -27.88 %    Socioeconomic Pre 19.5 %    Socioeconomic Post 24 %    Socioeconomic % Change  23.08 %    Psych/Spiritual Pre 24 %    Psych/Spiritual Post 18.79 %    Psych/Spiritual % Change -21.71 %    Family Pre 24.38 %    Family Post 18 %    Family % Change -26.17 %    GLOBAL Pre 19.23 %    GLOBAL Post 15.58 %    GLOBAL % Change -18.98 %             Nutrition & Weight - Outcomes:  Pre Biometrics - 11/27/19 1310      Pre Biometrics   Height 5' 7.2" (1.707 m)    Weight 228 lb 14.4 oz (103.8 kg)  BMI (Calculated) 35.63    Single Leg Stand 9.91 seconds            Nutrition:  Nutrition Therapy & Goals - 12/26/19 1128      Nutrition Therapy   Diet Heart healthy    Drug/Food Interactions Statins/Certain Fruits    Protein (specify units) 70-75g    Fiber 30 grams    Whole Grain Foods 3 servings    Saturated Fats 12 max. grams    Fruits and Vegetables 5 servings/day    Sodium 1.5 grams      Personal Nutrition Goals   Nutrition Goal ST: add in protein (hummus + and veggies) LT: weight to 200 lbs, improve lung capactiy, and strengthen heart, increase muscle mass     Comments B: bacon egg and cheese biscuit or eggs at home and have baked bacon (omelette) with english muffin or toast - coffee and no sugar, orange juice (takes pills) L: 2 tacos - eats lots of [place] food, ice water with lemon (sometimes beer with chips and salsa), or two chicken enchiladas ([place] - restaurant "[employer] [place]" or "[employer]"  D: Varies: chicken cattitorie (baked) - with fresh homemade sauce, and fresh vegetables, with some pasta (white). Still having dysphagia - his family got surgery. 3-4 diet pepsis and then water. His dysphagia depends on how mindful he is. Reviewed heart healthy eating. Pt reports having lots of vegetables and beans during dinner. Pt would like to increase protein.      Intervention Plan   Intervention Prescribe, educate and counsel regarding individualized specific dietary modifications aiming towards targeted core components such as weight, hypertension, lipid management, diabetes, heart failure and other comorbidities.    Expected Outcomes Short Term Goal: Understand basic principles of dietary content, such as calories, fat, sodium, cholesterol and nutrients.;Short Term Goal: A plan has been developed with personal nutrition goals set during dietitian appointment.;Long Term Goal: Adherence to prescribed nutrition plan.           Nutrition Discharge:  Nutrition Assessments - 05/02/20 1155      MEDFICTS Scores   Pre Score 61    Post Score 45    Score Difference -16           Education Questionnaire Score:  Knowledge Questionnaire Score - 05/02/20 1155      Knowledge Questionnaire Score   Pre Score 24/26 Education Focus: A&P, nutrition    Post Score 25/26           Goals reviewed with patient; copy given to patient.

## 2020-05-23 NOTE — Progress Notes (Signed)
Daily Session Note  Patient Details  Name: Kevin Pearson MRN: [id number] Date of Birth: 04-29-1951 Referring Provider:     Cardiac Rehab from 11/27/2019 in [facility]  Referring Provider Ida Rogue MD      Encounter Date: 05/23/2020  Check In:  Session Check In - 05/23/20 1050      Check-In   Supervising physician immediately available to respond to emergencies See telemetry face sheet for immediately available ER MD    Location [facility]-Cardiac & Pulmonary Rehab    Staff Present Birdie Sons, MPA, RN;Melissa Caiola RDN, Rowe Pavy, BA, ACSM CEP, Exercise Physiologist;Jessica Calpine, MA, RCEP, CCRP, CCET    Virtual Visit No    Medication changes reported     No    Fall or balance concerns reported    No    Tobacco Cessation No Change    Warm-up and Cool-down Performed on first and last piece of equipment    Resistance Training Performed Yes    VAD Patient? No    PAD/SET Patient? No      Pain Assessment   Currently in Pain? No/denies              Social History   Tobacco Use  Smoking Status Former Smoker  . Packs/day: 1.00  . Years: 40.00  . Pack years: 40.00  . Types: Cigarettes  . Quit date: 08/2015  . Years since quitting: 4.7  Smokeless Tobacco Never Used  Tobacco Comment   Quit 2017      Goals Met:  Independence with exercise equipment Exercise tolerated well No report of cardiac concerns or symptoms Strength training completed today  Goals Unmet:  Not Applicable  Comments:  Dawaun graduated today from  rehab with 36 sessions completed.  Details of the patient's exercise prescription and what He needs to do in order to continue the prescription and progress were discussed with patient.  Patient was given a copy of prescription and goals.  Patient verbalized understanding.  Jamall plans to continue to exercise by exercising at [employer].    Dr. Emily Filbert is Medical Director for [place] [name].

## 2020-05-23 NOTE — Telephone Encounter (Signed)
CSW spoke with pt earlier today regarding financial concerns and ongoing issues with stress related to financial strain.  States he gets $1,300/month which he has to use for everything- normally this is sufficient but with current large hospital bills from cardiac rehab copays and other expenses he is not sure how he will be able to pay for everything.  As pt has insurance he is not eligible for financial assistance options with bills but CSW still wanting to check if other expenses could be reduced.  Started to discuss with pt but states he has to go to cardiac rehab today and request CSW call back after 2pm  CSW called back at 2:30pm but no answer- left message requesting return call  Burna Sis, LCSW Clinical Social Worker [facility] Desk#: [phone] Cell#: [phone]
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
## 2020-05-24 ENCOUNTER — Telehealth: Payer: Self-pay | Admitting: Licensed Clinical Social Worker

## 2020-05-24 ENCOUNTER — Other Ambulatory Visit: Payer: Self-pay | Admitting: Family

## 2020-05-24 NOTE — Addendum Note (Signed)
Addended by: Alver Sorrow on: 05/24/2020 10:43 AM   Modules accepted: Orders

## 2020-05-24 NOTE — Telephone Encounter (Addendum)
CSW spoke with pt this morning regarding current financial strain and stress.  Pt reports biggest source of stress right now that he would like assistance with is getting home health out to his home.  Pt was referred to home health during ED visit last week but the agency he was referred to, [facility], did not have the staffing to provide and RN and aid at that time.  Pt states he would like to get home health through [facility] who manages someone else in his apartment site.  CSW called TOC social worker from ED who states their orders can no longer be sent to another agency as patient has left the hospital. CSW spoke with [facility] rep who confirmed they have staffing to accommodate patient and could have someone out as soon as Tuesday- once orders are placed in Epic she should be able to pull them.  CSW had pt [facility] physician fax over home health orders- confirmed [facility] received and will reach out to pt to discuss details.  CSW updated pt on referral acceptance- pt very relieved by this.  CSW also spoke with pt ongoing concerns with stress and anxiety- pt is agreeable to counseling and prefers [facility] provider- CSW will research and provide pt with information.  Will continue to follow and assist as needed  Burna Sis, LCSW Clinical Social Worker [facility] Desk#: [phone] Cell#: [phone]
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
## 2020-07-16 ENCOUNTER — Ambulatory Visit (INDEPENDENT_AMBULATORY_CARE_PROVIDER_SITE_OTHER): Payer: Medicare Other | Admitting: Cardiovascular Disease

## 2020-07-16 ENCOUNTER — Encounter: Payer: Self-pay | Admitting: Cardiovascular Disease

## 2020-07-16 ENCOUNTER — Other Ambulatory Visit: Payer: Self-pay

## 2020-07-16 VITALS — BP 110/80 | HR 75 | Ht 67.5 in | Wt 230.0 lb

## 2020-07-16 DIAGNOSIS — I4821 Permanent atrial fibrillation: Secondary | ICD-10-CM | POA: Diagnosis not present

## 2020-07-16 DIAGNOSIS — I5022 Chronic systolic (congestive) heart failure: Secondary | ICD-10-CM

## 2020-07-16 DIAGNOSIS — I25118 Atherosclerotic heart disease of native coronary artery with other forms of angina pectoris: Secondary | ICD-10-CM | POA: Diagnosis not present

## 2020-07-16 DIAGNOSIS — Z950 Presence of cardiac pacemaker: Secondary | ICD-10-CM

## 2020-07-16 DIAGNOSIS — E785 Hyperlipidemia, unspecified: Secondary | ICD-10-CM | POA: Diagnosis not present

## 2020-07-16 DIAGNOSIS — I255 Ischemic cardiomyopathy: Secondary | ICD-10-CM

## 2020-07-16 NOTE — Patient Instructions (Addendum)
Lipids today   Medication Instructions:  Hold xarelto 2 days prior to surgery Hold plavix 5 days prior to surgery Go on asa 81 daily when not on plavix  If you need a refill on your cardiac medications before your next appointment, please call your pharmacy.    Lab work: No new labs needed   If you have labs (blood work) drawn today and your tests are completely normal, you will receive your results only by: Marland Kitchen MyChart Message (if you have MyChart) OR . A paper copy in the mail If you have any lab test that is abnormal or we need to change your treatment, we will call you to review the results.   Testing/Procedures: No new testing needed   Follow-Up: At [facility], you and your health needs are our priority.  As part of our continuing mission to provide you with exceptional heart care, we have created designated Provider Care Teams.  These Care Teams include your primary Cardiologist (physician) and Advanced Practice Providers (APPs -  Physician Assistants and Nurse Practitioners) who all work together to provide you with the care you need, when you need it.  . You will need a follow up appointment in 6 months  . Providers on your designated Care Team:   . Nicolasa Ducking, NP . Eula Listen, PA-C . Marisue Ivan, PA-C  Any Other Special Instructions Will Be Listed Below (If Applicable).  COVID-19 Vaccine Information can be found at: [email or web address] For questions related to vaccine distribution or appointments, please email [email or web address] or call [phone].

## 2020-07-17 LAB — LIPID PANEL
Chol/HDL Ratio: 3.2 ratio (ref 0.0–5.0)
Cholesterol, Total: 139 mg/dL (ref 100–199)
HDL: 43 mg/dL (ref >39)
LDL Chol Calc (NIH): 69 mg/dL (ref 0–99)
Triglycerides: 160 mg/dL — ABNORMAL HIGH (ref 0–149)
VLDL Cholesterol Cal: 27 mg/dL (ref 5–40)

## 2020-07-18 ENCOUNTER — Telehealth: Payer: Self-pay | Admitting: Cardiovascular Disease

## 2020-07-18 NOTE — Telephone Encounter (Signed)
I spoke with the patient regarding his lab results.  He voices understanding.

## 2020-07-18 NOTE — Telephone Encounter (Signed)
Patient calling in for lab results from 1/11

## 2020-07-18 NOTE — Telephone Encounter (Signed)
Kevin Iba, MD  07/17/2020 5:24 PM EST      Cholesterol at goal

## 2020-07-25 ENCOUNTER — Other Ambulatory Visit: Payer: Self-pay | Admitting: Surgery

## 2020-07-26 ENCOUNTER — Encounter (HOSPITAL_COMMUNITY): Payer: Self-pay | Admitting: Urgent Care

## 2020-07-26 ENCOUNTER — Inpatient Hospital Stay: Admission: RE | Admit: 2020-07-26 | Payer: Medicare Other | Source: Ambulatory Visit

## 2020-07-29 ENCOUNTER — Other Ambulatory Visit
Admission: RE | Admit: 2020-07-29 | Discharge: 2020-07-29 | Disposition: A | Discharge status: Home / Self Care | Payer: Medicare Other | Source: Ambulatory Visit | Attending: Surgery | Admitting: Surgery

## 2020-07-29 NOTE — Progress Notes (Signed)
No show for covid test today. Call to patient, stated that [place] an [place] out of [place] and was cancelling surgery for 07/31/20. Stated that he did call Dr. Joice Lofts office this am to let them know. I also called and left message with Tiffany at Dr. Joice Lofts office.

## 2020-07-30 ENCOUNTER — Encounter: Admission: RE | Admit: 2020-07-30 | Payer: Medicare Other | Source: Ambulatory Visit

## 2020-07-31 ENCOUNTER — Ambulatory Visit: Admission: RE | Admit: 2020-07-31 | Payer: Medicare Other | Source: Home / Self Care | Admitting: Surgery

## 2020-07-31 ENCOUNTER — Encounter: Admission: RE | Payer: Self-pay | Source: Home / Self Care

## 2020-07-31 SURGERY — RELEASE, CARPAL TUNNEL, ENDOSCOPIC
Anesthesia: Choice | Site: Wrist | Laterality: Right

## 2020-08-09 ENCOUNTER — Ambulatory Visit (INDEPENDENT_AMBULATORY_CARE_PROVIDER_SITE_OTHER): Payer: Medicare Other

## 2020-08-09 DIAGNOSIS — I255 Ischemic cardiomyopathy: Secondary | ICD-10-CM

## 2020-08-09 DIAGNOSIS — I4821 Permanent atrial fibrillation: Secondary | ICD-10-CM

## 2020-08-12 ENCOUNTER — Telehealth: Payer: Self-pay | Admitting: Emergency Medicine

## 2020-08-12 LAB — CUP PACEART REMOTE DEVICE CHECK
Battery Remaining Longevity: 19 mo
Battery Remaining Percentage: 22 %
Battery Voltage: 2.78 V
Date Time Interrogation Session: 20220204134138
HighPow Impedance: 61 Ohm
HighPow Impedance: 61 Ohm
Implantable Lead Implant Date: 20131003
Implantable Lead Implant Date: 20131003
Implantable Lead Location: 753858
Implantable Lead Location: 753860
Implantable Pulse Generator Implant Date: 20131003
Lead Channel Impedance Value: 460 Ohm
Lead Channel Impedance Value: 850 Ohm
Lead Channel Pacing Threshold Amplitude: 0.625 V
Lead Channel Pacing Threshold Amplitude: 1 V
Lead Channel Pacing Threshold Pulse Width: 0.5 ms
Lead Channel Pacing Threshold Pulse Width: 0.6 ms
Lead Channel Sensing Intrinsic Amplitude: 8.6 mV
Lead Channel Setting Pacing Amplitude: 2 V
Lead Channel Setting Pacing Amplitude: 2 V
Lead Channel Setting Pacing Pulse Width: 0.5 ms
Lead Channel Setting Pacing Pulse Width: 0.6 ms
Lead Channel Setting Sensing Sensitivity: 0.5 mV
Pulse Gen Serial Number: 7055215

## 2020-08-12 NOTE — Telephone Encounter (Addendum)
Attempted to contact patient to assess for symptoms in reference to elevated CorVue monitoring from 08/09/20 remote transmission. Contacted patient. Patient denies any edema, fluid issues, or any symptoms at this time. Patient did advise that he was moving to [place] and wanted to know what he should do with his monitor. Advised patient that he could take his monitor with him but, he would need to establish care with an EP provider in the area of his new residence and then transfer of care could be completed to his new provider for device monitoring.

## 2020-08-13 NOTE — Progress Notes (Signed)
Remote ICD transmission.   

## 2020-09-09 ENCOUNTER — Other Ambulatory Visit: Payer: Self-pay | Admitting: Internal Medicine

## 2020-09-09 DIAGNOSIS — I472 Ventricular tachycardia, unspecified: Secondary | ICD-10-CM

## 2021-03-14 ENCOUNTER — Telehealth: Payer: Self-pay

## 2021-03-14 NOTE — Telephone Encounter (Signed)
The patient transferred to [facility]. I cancelled all upcoming remotes and marked the patient inactive in paceart.

## 2021-04-01 NOTE — Progress Notes (Incomplete)
04/01/21 2:48 PM   Kevin Pearson 1950-11-17 [id number]  Referring provider:  Lowella Grip, MD [address] [place],  [place] [address] No chief complaint on file.    HPI: Kevin Pearson is a 71 y.o.male with a personal history of bladder neck contracture and UTI, who presents today for 1 year follow-up with IPSS and PVR.   In 2016 he underwent office laser procedure of prostate in [place]. This resulted in severe bladder neck contracture that required him to be taken to the operating room for dilation of bladder neck contracture in the past.  He is now managed with self cath to keep his bladder neck open.  He chronically on Flomax.   IPSS***  PMH: Past Medical History:  Diagnosis Date   Acid reflux    Anxiety    Arthritis    Atrial fibrillation (HCC)    CHF (congestive heart failure) (HCC)    Chronic orthostatic hypotension    Clotting disorder (HCC)    COPD (chronic obstructive pulmonary disease) (HCC)    Depression    Elevated PSA    Heart attack (HCC)    Heart disease    Heart failure (HCC)    Hepatitis    High cholesterol    Hypertension    Sleep apnea    TBI (traumatic brain injury) (HCC)    Urinary retention     Surgical History: Past Surgical History:  Procedure Laterality Date   BLADDER SURGERY     CARDIAC CATHETERIZATION     cardiac stents     CYSTOSCOPY WITH URETHRAL DILATATION N/A 06/25/2016   Procedure: CYSTOSCOPY WITH URETHRAL DILATATION;  Surgeon: Vanna Scotland, MD;  Location: [facility] ORS;  Service: Urology;  Laterality: N/A;   EP IMPLANTABLE DEVICE     St. Jude BiV-ICD   ESOPHAGOGASTRODUODENOSCOPY (EGD) WITH PROPOFOL N/A 04/06/2019   Procedure: ESOPHAGOGASTRODUODENOSCOPY (EGD) WITH PROPOFOL;  Surgeon: Wyline Mood, MD;  Location: [facility] ENDOSCOPY;  Service: Gastroenterology;  Laterality: N/A;   GREEN LIGHT LASER TURP (TRANSURETHRAL RESECTION OF PROSTATE  2016   done in [facility]    LEFT HEART CATH AND CORONARY ANGIOGRAPHY Left 10/27/2017    Procedure: LEFT HEART CATH AND CORONARY ANGIOGRAPHY;  Surgeon: Antonieta Iba, MD;  Location: [facility] INVASIVE CV LAB;  Service: Cardiovascular;  Laterality: Left;   RIGHT/LEFT HEART CATH AND CORONARY ANGIOGRAPHY N/A 11/16/2019   Procedure: RIGHT/LEFT HEART CATH AND CORONARY ANGIOGRAPHY;  Surgeon: Antonieta Iba, MD;  Location: [facility] INVASIVE CV LAB;  Service: Cardiovascular;  Laterality: N/A;    Home Medications:  Allergies as of 04/02/2021       Reactions   Mangifera Indica Anaphylaxis   Other Anaphylaxis, Itching, Other (See Comments)   Mango skin   Keflex [cephalexin] Nausea Only   Codeine Nausea And Vomiting        Medication List        Accurate as of April 01, 2021  2:48 PM. If you have any questions, ask your nurse or doctor.          acetaminophen 325 MG tablet Commonly known as: TYLENOL Take 650 mg by mouth every 6 (six) hours as needed for mild pain or headache.   albuterol 108 (90 Base) MCG/ACT inhaler Commonly known as: VENTOLIN HFA Inhale 2 puffs into the lungs every 6 (six) hours as needed for wheezing or shortness of breath.   busPIRone 10 MG tablet Commonly known as: BUSPAR Take 10 mg by mouth 3 (three) times daily.   clopidogrel  75 MG tablet Commonly known as: PLAVIX Take 75 mg by mouth daily.   divalproex 250 MG DR tablet Commonly known as: DEPAKOTE Take 750 mg by mouth 2 (two) times daily.   gabapentin 300 MG capsule Commonly known as: NEURONTIN Take 600 mg by mouth 2 (two) times daily.   losartan 25 MG tablet Commonly known as: COZAAR Take 50 mg by mouth in the morning and at bedtime.   Magnesium Oxide 400 MG Caps Take 1 capsule (400 mg total) by mouth daily.   melatonin 5 MG Tabs Take 5 mg by mouth at bedtime.   methocarbamol 500 MG tablet Commonly known as: Robaxin Take 1 tablet (500 mg total) by mouth every 8 (eight) hours as needed for muscle spasms.   metoprolol succinate 50 MG 24 hr tablet Commonly known as:  TOPROL-XL TAKE 1 TABLET BY MOUTH EVERY DAY WITH ORIMMEDIATELY AFTER MEAL   multivitamin tablet Take 1 tablet by mouth daily.   nitroGLYCERIN 0.4 MG SL tablet Commonly known as: NITROSTAT Place 1 tablet (0.4 mg total) under the tongue every 5 (five) minutes as needed for chest pain. What changed: when to take this   omeprazole 40 MG capsule Commonly known as: PRILOSEC Take 1 capsule (40 mg total) by mouth 2 (two) times daily. What changed: when to take this   polyethylene glycol powder 17 GM/SCOOP powder Commonly known as: GLYCOLAX/MIRALAX Take 17 g by mouth daily.   Refresh Optive PF 0.5-0.9 % Soln Generic drug: Carboxymethylcell-Glycerin PF Place 1 drop into both eyes daily as needed (Dry eye).   rivaroxaban 20 MG Tabs tablet Commonly known as: XARELTO Take 20 mg by mouth daily with supper.   rosuvastatin 20 MG tablet Commonly known as: CRESTOR Take 20 mg by mouth at bedtime.   senna 8.6 MG tablet Commonly known as: SENOKOT Take 2 tablets by mouth 2 (two) times daily.   tamsulosin 0.4 MG Caps capsule Commonly known as: FLOMAX TAKE ONE CAPSULE BY MOUTH EVERY DAY AFTER SUPPER What changed: See the new instructions.   torsemide 20 MG tablet Commonly known as: DEMADEX Take 40 mg by mouth 2 (two) times daily.   traMADol 50 MG tablet Commonly known as: ULTRAM Take 50 mg by mouth every 6 (six) hours as needed.   traZODone 100 MG tablet Commonly known as: DESYREL Take 100 mg by mouth at bedtime.        Allergies:  Allergies  Allergen Reactions   Mangifera Indica Anaphylaxis   Other Anaphylaxis, Itching and Other (See Comments)    Mango skin   Keflex [Cephalexin] Nausea Only   Codeine Nausea And Vomiting    Family History: Family History  Problem Relation Age of Onset   Other Father        Cerebral hemorrhage   Kidney cancer Neg Hx    Kidney disease Neg Hx    Prostate cancer Neg Hx     Social History:  reports that he quit smoking about 5 years  ago. His smoking use included cigarettes. He has a 40.00 pack-year smoking history. He has never used smokeless tobacco. He reports that he does not currently use alcohol. He reports that he does not use drugs.   Physical Exam: There were no vitals taken for this visit.  Constitutional:  Alert and oriented, No acute distress. HEENT:  AT, moist mucus membranes.  Trachea midline, no masses. Cardiovascular: No clubbing, cyanosis, or edema. Respiratory: Normal respiratory effort, no increased work of breathing. Skin: No rashes, bruises or  suspicious lesions. Neurologic: Grossly intact, no focal deficits, moving all 4 extremities. Psychiatric: Normal mood and affect.  Laboratory Data:  Lab Results  Component Value Date   CREATININE 1.54 (H) 05/14/2020     Urinalysis   Pertinent Imaging: PVR    Assessment & Plan:   Bladder neck contracture  History of UTI    No follow-ups on file. I,Kailey Littlejohn,acting as a Neurosurgeon for Vanna Scotland, MD.,have documented all relevant documentation on the behalf of Vanna Scotland, MD,as directed by  Vanna Scotland, MD while in the presence of Vanna Scotland, MD.   [facility] [address], [address] [place], [place] [address] [phone]

## 2021-04-02 ENCOUNTER — Ambulatory Visit: Payer: Medicare Other | Admitting: Urology

## 2021-04-07 ENCOUNTER — Encounter: Payer: Self-pay | Admitting: Urology

## 2021-11-17 ENCOUNTER — Telehealth: Payer: Self-pay | Admitting: Cardiovascular Disease

## 2021-11-17 NOTE — Telephone Encounter (Signed)
3 attempts to schedule fu appt from recall list.   Deleting recall.   

## 2024-04-05 DEATH — deceased
